# Patient Record
Sex: Female | Born: 1935 | Race: White | Hispanic: No | Smoking: Never smoker
Health system: Southern US, Community
[De-identification: ages and names within clinical notes are randomized; demographics above are authoritative.]

## PROBLEM LIST (undated history)

## (undated) DIAGNOSIS — K219 Gastro-esophageal reflux disease without esophagitis: Secondary | ICD-10-CM

## (undated) DIAGNOSIS — E785 Hyperlipidemia, unspecified: Secondary | ICD-10-CM

## (undated) DIAGNOSIS — F411 Generalized anxiety disorder: Secondary | ICD-10-CM

## (undated) DIAGNOSIS — M1990 Unspecified osteoarthritis, unspecified site: Secondary | ICD-10-CM

## (undated) DIAGNOSIS — I10 Essential (primary) hypertension: Secondary | ICD-10-CM

## (undated) DIAGNOSIS — E039 Hypothyroidism, unspecified: Secondary | ICD-10-CM

## (undated) DIAGNOSIS — I359 Nonrheumatic aortic valve disorder, unspecified: Secondary | ICD-10-CM

## (undated) DIAGNOSIS — B029 Zoster without complications: Secondary | ICD-10-CM

## (undated) DIAGNOSIS — L309 Dermatitis, unspecified: Secondary | ICD-10-CM

## (undated) DIAGNOSIS — Z95810 Presence of automatic (implantable) cardiac defibrillator: Secondary | ICD-10-CM

## (undated) DIAGNOSIS — J309 Allergic rhinitis, unspecified: Secondary | ICD-10-CM

## (undated) DIAGNOSIS — I872 Venous insufficiency (chronic) (peripheral): Secondary | ICD-10-CM

## (undated) HISTORY — DX: Generalized anxiety disorder: F41.1

## (undated) HISTORY — DX: Allergic rhinitis, unspecified: J30.9

## (undated) HISTORY — DX: Hyperlipidemia, unspecified: E78.5

## (undated) HISTORY — PX: VESICOVAGINAL FISTULA CLOSURE W/ TAH: SUR271

## (undated) HISTORY — DX: Nonrheumatic aortic valve disorder, unspecified: I35.9

## (undated) HISTORY — DX: Venous insufficiency (chronic) (peripheral): I87.2

## (undated) HISTORY — DX: Unspecified osteoarthritis, unspecified site: M19.90

## (undated) HISTORY — DX: Dermatitis, unspecified: L30.9

## (undated) HISTORY — DX: Hypothyroidism, unspecified: E03.9

## (undated) HISTORY — DX: Essential (primary) hypertension: I10

## (undated) HISTORY — DX: Gastro-esophageal reflux disease without esophagitis: K21.9

## (undated) HISTORY — DX: Zoster without complications: B02.9

---

## 1992-12-12 LAB — HM COLONOSCOPY

## 1999-12-03 ENCOUNTER — Other Ambulatory Visit: Admission: RE | Admit: 1999-12-03 | Discharge: 1999-12-03 | Payer: Self-pay | Admitting: Obstetrics and Gynecology

## 2002-04-24 ENCOUNTER — Other Ambulatory Visit: Admission: RE | Admit: 2002-04-24 | Discharge: 2002-04-24 | Payer: Self-pay | Admitting: Obstetrics and Gynecology

## 2002-05-02 ENCOUNTER — Encounter: Payer: Self-pay | Admitting: Emergency Medicine

## 2002-05-02 ENCOUNTER — Emergency Department (HOSPITAL_COMMUNITY): Admission: EM | Admit: 2002-05-02 | Discharge: 2002-05-02 | Payer: Self-pay | Admitting: Emergency Medicine

## 2002-05-03 ENCOUNTER — Encounter: Admission: RE | Admit: 2002-05-03 | Discharge: 2002-05-03 | Payer: Self-pay | Admitting: General Surgery

## 2002-05-03 ENCOUNTER — Encounter: Payer: Self-pay | Admitting: General Surgery

## 2002-05-04 ENCOUNTER — Encounter: Admission: RE | Admit: 2002-05-04 | Discharge: 2002-05-04 | Payer: Self-pay | Admitting: General Surgery

## 2002-05-04 ENCOUNTER — Encounter: Payer: Self-pay | Admitting: General Surgery

## 2002-05-31 ENCOUNTER — Encounter: Payer: Self-pay | Admitting: General Surgery

## 2002-05-31 ENCOUNTER — Encounter: Admission: RE | Admit: 2002-05-31 | Discharge: 2002-05-31 | Payer: Self-pay | Admitting: General Surgery

## 2002-06-21 ENCOUNTER — Ambulatory Visit (HOSPITAL_COMMUNITY): Admission: RE | Admit: 2002-06-21 | Discharge: 2002-06-21 | Payer: Self-pay | Admitting: General Surgery

## 2004-04-30 ENCOUNTER — Ambulatory Visit: Payer: Self-pay | Admitting: Pulmonary Disease

## 2005-01-05 ENCOUNTER — Ambulatory Visit: Payer: Self-pay | Admitting: Pulmonary Disease

## 2005-01-11 ENCOUNTER — Ambulatory Visit: Payer: Self-pay | Admitting: Pulmonary Disease

## 2005-03-04 ENCOUNTER — Ambulatory Visit: Payer: Self-pay | Admitting: Pulmonary Disease

## 2005-03-22 ENCOUNTER — Ambulatory Visit: Payer: Self-pay | Admitting: Pulmonary Disease

## 2005-04-12 ENCOUNTER — Ambulatory Visit: Payer: Self-pay | Admitting: Pulmonary Disease

## 2005-06-21 ENCOUNTER — Ambulatory Visit: Payer: Self-pay | Admitting: Pulmonary Disease

## 2005-10-12 ENCOUNTER — Ambulatory Visit: Payer: Self-pay | Admitting: Pulmonary Disease

## 2005-11-22 ENCOUNTER — Ambulatory Visit: Payer: Self-pay | Admitting: Pulmonary Disease

## 2005-12-09 ENCOUNTER — Encounter: Payer: Self-pay | Admitting: Cardiology

## 2005-12-09 ENCOUNTER — Ambulatory Visit: Payer: Self-pay

## 2005-12-20 ENCOUNTER — Ambulatory Visit: Payer: Self-pay | Admitting: Pulmonary Disease

## 2006-03-11 ENCOUNTER — Ambulatory Visit: Payer: Self-pay | Admitting: Pulmonary Disease

## 2006-03-28 ENCOUNTER — Ambulatory Visit: Payer: Self-pay | Admitting: Pulmonary Disease

## 2006-05-23 ENCOUNTER — Ambulatory Visit: Payer: Self-pay | Admitting: Pulmonary Disease

## 2006-07-04 ENCOUNTER — Ambulatory Visit: Payer: Self-pay | Admitting: Pulmonary Disease

## 2006-07-04 LAB — CONVERTED CEMR LAB: Hgb A1c MFr Bld: 5.4 % (ref 4.6–6.0)

## 2006-07-21 ENCOUNTER — Ambulatory Visit: Payer: Self-pay | Admitting: Pulmonary Disease

## 2006-07-21 LAB — CONVERTED CEMR LAB
ALT: 18 units/L (ref 0–40)
AST: 25 units/L (ref 0–37)
Albumin: 3.5 g/dL (ref 3.5–5.2)
Alkaline Phosphatase: 54 units/L (ref 39–117)
BUN: 15 mg/dL (ref 6–23)
Basophils Absolute: 0.1 10*3/uL (ref 0.0–0.1)
Basophils Relative: 1.4 % — ABNORMAL HIGH (ref 0.0–1.0)
Bilirubin, Direct: 0.1 mg/dL (ref 0.0–0.3)
CO2: 27 meq/L (ref 19–32)
Calcium: 8.9 mg/dL (ref 8.4–10.5)
Chloride: 108 meq/L (ref 96–112)
Cholesterol: 155 mg/dL (ref 0–200)
Creatinine, Ser: 1 mg/dL (ref 0.4–1.2)
Direct LDL: 99.7 mg/dL
Eosinophils Absolute: 0.2 10*3/uL (ref 0.0–0.6)
Eosinophils Relative: 3.5 % (ref 0.0–5.0)
GFR calc Af Amer: 69 mL/min
GFR calc non Af Amer: 57 mL/min
Glucose, Bld: 104 mg/dL — ABNORMAL HIGH (ref 70–99)
HCT: 37.9 % (ref 36.0–46.0)
HDL: 34.8 mg/dL — ABNORMAL LOW (ref >39.0)
Hemoglobin: 13.1 g/dL (ref 12.0–15.0)
Lymphocytes Relative: 44.4 % (ref 12.0–46.0)
MCHC: 34.5 g/dL (ref 30.0–36.0)
MCV: 93.1 fL (ref 78.0–100.0)
Monocytes Absolute: 0.5 10*3/uL (ref 0.2–0.7)
Monocytes Relative: 9.6 % (ref 3.0–11.0)
Neutro Abs: 2 10*3/uL (ref 1.4–7.7)
Neutrophils Relative %: 41.1 % — ABNORMAL LOW (ref 43.0–77.0)
Platelets: 211 10*3/uL (ref 150–400)
Potassium: 4.2 meq/L (ref 3.5–5.1)
RBC: 4.08 M/uL (ref 3.87–5.11)
RDW: 13.4 % (ref 11.5–14.6)
Sodium: 141 meq/L (ref 135–145)
TSH: 3.87 microintl units/mL (ref 0.35–5.50)
Total Bilirubin: 0.3 mg/dL (ref 0.3–1.2)
Total CHOL/HDL Ratio: 4.5
Total Protein: 6.9 g/dL (ref 6.0–8.3)
Triglycerides: 227 mg/dL (ref 0–149)
VLDL: 45 mg/dL — ABNORMAL HIGH (ref 0–40)
WBC: 4.9 10*3/uL (ref 4.5–10.5)

## 2006-09-14 ENCOUNTER — Ambulatory Visit: Payer: Self-pay | Admitting: Pulmonary Disease

## 2006-09-14 LAB — CONVERTED CEMR LAB
CK-MB: 5 ng/mL (ref 0.3–4.0)
Pro B Natriuretic peptide (BNP): 24 pg/mL (ref 0.0–100.0)
Sed Rate: 44 mm/hr — ABNORMAL HIGH (ref 0–25)

## 2006-10-06 ENCOUNTER — Ambulatory Visit: Payer: Self-pay | Admitting: Cardiology

## 2006-10-13 ENCOUNTER — Ambulatory Visit: Payer: Self-pay

## 2006-10-26 ENCOUNTER — Ambulatory Visit: Payer: Self-pay | Admitting: Pulmonary Disease

## 2007-01-13 ENCOUNTER — Ambulatory Visit: Payer: Self-pay | Admitting: Pulmonary Disease

## 2007-04-10 ENCOUNTER — Ambulatory Visit: Payer: Self-pay | Admitting: Pulmonary Disease

## 2007-04-12 DIAGNOSIS — K219 Gastro-esophageal reflux disease without esophagitis: Secondary | ICD-10-CM | POA: Insufficient documentation

## 2007-04-12 DIAGNOSIS — F411 Generalized anxiety disorder: Secondary | ICD-10-CM | POA: Insufficient documentation

## 2007-04-12 DIAGNOSIS — E782 Mixed hyperlipidemia: Secondary | ICD-10-CM | POA: Insufficient documentation

## 2007-04-12 DIAGNOSIS — B029 Zoster without complications: Secondary | ICD-10-CM | POA: Insufficient documentation

## 2007-04-12 DIAGNOSIS — E785 Hyperlipidemia, unspecified: Secondary | ICD-10-CM

## 2007-04-12 DIAGNOSIS — I872 Venous insufficiency (chronic) (peripheral): Secondary | ICD-10-CM | POA: Insufficient documentation

## 2007-04-12 DIAGNOSIS — J209 Acute bronchitis, unspecified: Secondary | ICD-10-CM | POA: Insufficient documentation

## 2007-04-18 ENCOUNTER — Ambulatory Visit: Payer: Self-pay | Admitting: Pulmonary Disease

## 2007-08-07 ENCOUNTER — Ambulatory Visit: Payer: Self-pay | Admitting: Pulmonary Disease

## 2007-08-16 ENCOUNTER — Ambulatory Visit: Payer: Self-pay | Admitting: Pulmonary Disease

## 2007-08-16 ENCOUNTER — Telehealth (INDEPENDENT_AMBULATORY_CARE_PROVIDER_SITE_OTHER): Payer: Self-pay | Admitting: *Deleted

## 2007-10-31 ENCOUNTER — Ambulatory Visit: Payer: Self-pay | Admitting: Pulmonary Disease

## 2007-10-31 DIAGNOSIS — J309 Allergic rhinitis, unspecified: Secondary | ICD-10-CM

## 2007-10-31 DIAGNOSIS — I10 Essential (primary) hypertension: Secondary | ICD-10-CM | POA: Insufficient documentation

## 2007-10-31 DIAGNOSIS — E039 Hypothyroidism, unspecified: Secondary | ICD-10-CM | POA: Insufficient documentation

## 2007-11-05 DIAGNOSIS — M1990 Unspecified osteoarthritis, unspecified site: Secondary | ICD-10-CM | POA: Insufficient documentation

## 2007-11-05 DIAGNOSIS — D126 Benign neoplasm of colon, unspecified: Secondary | ICD-10-CM | POA: Insufficient documentation

## 2007-11-08 ENCOUNTER — Ambulatory Visit: Payer: Self-pay | Admitting: Pulmonary Disease

## 2007-11-27 ENCOUNTER — Telehealth (INDEPENDENT_AMBULATORY_CARE_PROVIDER_SITE_OTHER): Payer: Self-pay | Admitting: *Deleted

## 2007-12-01 ENCOUNTER — Ambulatory Visit: Payer: Self-pay | Admitting: Pulmonary Disease

## 2007-12-14 LAB — CONVERTED CEMR LAB
Albumin: 3.6 g/dL (ref 3.5–5.2)
Alkaline Phosphatase: 47 units/L (ref 39–117)
BUN: 18 mg/dL (ref 6–23)
Calcium: 9.3 mg/dL (ref 8.4–10.5)
Eosinophils Absolute: 0.3 10*3/uL (ref 0.0–0.7)
Eosinophils Relative: 4.2 % (ref 0.0–5.0)
GFR calc Af Amer: 78 mL/min
GFR calc non Af Amer: 64 mL/min
HCT: 38.1 % (ref 36.0–46.0)
HDL: 30.4 mg/dL — ABNORMAL LOW (ref >39.0)
Hemoglobin: 12.5 g/dL (ref 12.0–15.0)
MCV: 94.5 fL (ref 78.0–100.0)
Monocytes Absolute: 0.5 10*3/uL (ref 0.1–1.0)
Neutro Abs: 2.5 10*3/uL (ref 1.4–7.7)
Platelets: 193 10*3/uL (ref 150–400)
Potassium: 4.1 meq/L (ref 3.5–5.1)
RDW: 13.6 % (ref 11.5–14.6)
TSH: 3.55 microintl units/mL (ref 0.35–5.50)
Total Protein: 6.8 g/dL (ref 6.0–8.3)
Triglycerides: 170 mg/dL — ABNORMAL HIGH (ref 0–149)

## 2008-01-24 ENCOUNTER — Ambulatory Visit: Payer: Self-pay | Admitting: Pulmonary Disease

## 2008-01-24 DIAGNOSIS — I359 Nonrheumatic aortic valve disorder, unspecified: Secondary | ICD-10-CM | POA: Insufficient documentation

## 2008-01-24 DIAGNOSIS — I350 Nonrheumatic aortic (valve) stenosis: Secondary | ICD-10-CM | POA: Insufficient documentation

## 2008-03-21 ENCOUNTER — Encounter: Payer: Self-pay | Admitting: Pulmonary Disease

## 2008-03-21 ENCOUNTER — Ambulatory Visit: Payer: Self-pay

## 2008-03-27 ENCOUNTER — Ambulatory Visit: Payer: Self-pay | Admitting: Pulmonary Disease

## 2008-04-26 ENCOUNTER — Telehealth (INDEPENDENT_AMBULATORY_CARE_PROVIDER_SITE_OTHER): Payer: Self-pay | Admitting: *Deleted

## 2008-06-05 ENCOUNTER — Telehealth (INDEPENDENT_AMBULATORY_CARE_PROVIDER_SITE_OTHER): Payer: Self-pay | Admitting: *Deleted

## 2008-06-06 ENCOUNTER — Ambulatory Visit: Payer: Self-pay | Admitting: Pulmonary Disease

## 2008-06-06 DIAGNOSIS — J069 Acute upper respiratory infection, unspecified: Secondary | ICD-10-CM | POA: Insufficient documentation

## 2008-07-25 ENCOUNTER — Ambulatory Visit: Payer: Self-pay | Admitting: Pulmonary Disease

## 2008-07-29 ENCOUNTER — Ambulatory Visit: Payer: Self-pay | Admitting: Pulmonary Disease

## 2008-08-16 LAB — CONVERTED CEMR LAB
Alkaline Phosphatase: 43 units/L (ref 39–117)
Basophils Absolute: 0.1 10*3/uL (ref 0.0–0.1)
Bilirubin, Direct: 0.1 mg/dL (ref 0.0–0.3)
Calcium: 9.8 mg/dL (ref 8.4–10.5)
Cholesterol: 181 mg/dL (ref 0–200)
Eosinophils Absolute: 0.2 10*3/uL (ref 0.0–0.7)
GFR calc Af Amer: 69 mL/min
GFR calc non Af Amer: 57 mL/min
Glucose, Bld: 101 mg/dL — ABNORMAL HIGH (ref 70–99)
HCT: 38.4 % (ref 36.0–46.0)
HDL: 35.3 mg/dL — ABNORMAL LOW (ref >39.0)
Hemoglobin: 13.2 g/dL (ref 12.0–15.0)
MCHC: 34.3 g/dL (ref 30.0–36.0)
MCV: 93.5 fL (ref 78.0–100.0)
Monocytes Absolute: 0.5 10*3/uL (ref 0.1–1.0)
Neutro Abs: 2.3 10*3/uL (ref 1.4–7.7)
Platelets: 192 10*3/uL (ref 150–400)
Potassium: 4.2 meq/L (ref 3.5–5.1)
RDW: 13.6 % (ref 11.5–14.6)
Sodium: 143 meq/L (ref 135–145)
Total CHOL/HDL Ratio: 5.1
Total Protein: 7.2 g/dL (ref 6.0–8.3)
Triglycerides: 198 mg/dL — ABNORMAL HIGH (ref 0–149)

## 2008-11-13 ENCOUNTER — Telehealth (INDEPENDENT_AMBULATORY_CARE_PROVIDER_SITE_OTHER): Payer: Self-pay | Admitting: *Deleted

## 2008-11-21 ENCOUNTER — Ambulatory Visit: Payer: Self-pay | Admitting: Pulmonary Disease

## 2009-03-26 ENCOUNTER — Ambulatory Visit: Payer: Self-pay | Admitting: Pulmonary Disease

## 2009-03-26 DIAGNOSIS — L259 Unspecified contact dermatitis, unspecified cause: Secondary | ICD-10-CM

## 2009-04-02 ENCOUNTER — Ambulatory Visit: Payer: Self-pay | Admitting: Pulmonary Disease

## 2009-04-14 LAB — CONVERTED CEMR LAB
ALT: 25 units/L (ref 0–35)
AST: 30 units/L (ref 0–37)
Albumin: 4.1 g/dL (ref 3.5–5.2)
Alkaline Phosphatase: 33 units/L — ABNORMAL LOW (ref 39–117)
BUN: 25 mg/dL — ABNORMAL HIGH (ref 6–23)
Basophils Absolute: 0 10*3/uL (ref 0.0–0.1)
Basophils Relative: 0.4 % (ref 0.0–3.0)
Bilirubin, Direct: 0.1 mg/dL (ref 0.0–0.3)
CO2: 29 meq/L (ref 19–32)
Calcium: 9.5 mg/dL (ref 8.4–10.5)
Chloride: 106 meq/L (ref 96–112)
Cholesterol: 183 mg/dL (ref 0–200)
Creatinine, Ser: 1.4 mg/dL — ABNORMAL HIGH (ref 0.4–1.2)
Eosinophils Absolute: 0.2 10*3/uL (ref 0.0–0.7)
Eosinophils Relative: 3.2 % (ref 0.0–5.0)
GFR calc non Af Amer: 38.34 mL/min (ref >60)
Glucose, Bld: 81 mg/dL (ref 70–99)
HCT: 37.7 % (ref 36.0–46.0)
HDL: 40.3 mg/dL (ref >39.00)
Hemoglobin: 13 g/dL (ref 12.0–15.0)
LDL Cholesterol: 121 mg/dL — ABNORMAL HIGH (ref 0–99)
Lymphocytes Relative: 43.2 % (ref 12.0–46.0)
Lymphs Abs: 2.7 10*3/uL (ref 0.7–4.0)
MCHC: 34.5 g/dL (ref 30.0–36.0)
MCV: 95.9 fL (ref 78.0–100.0)
Monocytes Absolute: 0.5 10*3/uL (ref 0.1–1.0)
Monocytes Relative: 8.8 % (ref 3.0–12.0)
Neutro Abs: 2.8 10*3/uL (ref 1.4–7.7)
Neutrophils Relative %: 44.4 % (ref 43.0–77.0)
Platelets: 194 10*3/uL (ref 150.0–400.0)
Potassium: 4.2 meq/L (ref 3.5–5.1)
RBC: 3.93 M/uL (ref 3.87–5.11)
RDW: 13.5 % (ref 11.5–14.6)
Sodium: 146 meq/L — ABNORMAL HIGH (ref 135–145)
TSH: 4.42 microintl units/mL (ref 0.35–5.50)
Total Bilirubin: 0.7 mg/dL (ref 0.3–1.2)
Total CHOL/HDL Ratio: 5
Total Protein: 7.3 g/dL (ref 6.0–8.3)
Triglycerides: 109 mg/dL (ref 0.0–149.0)
VLDL: 21.8 mg/dL (ref 0.0–40.0)
WBC: 6.2 10*3/uL (ref 4.5–10.5)

## 2009-04-16 LAB — CONVERTED CEMR LAB: Vit D, 25-Hydroxy: 15 ng/mL — ABNORMAL LOW (ref 30–89)

## 2009-07-09 ENCOUNTER — Telehealth (INDEPENDENT_AMBULATORY_CARE_PROVIDER_SITE_OTHER): Payer: Self-pay | Admitting: *Deleted

## 2009-12-02 ENCOUNTER — Ambulatory Visit: Payer: Self-pay | Admitting: Pulmonary Disease

## 2009-12-02 ENCOUNTER — Encounter: Payer: Self-pay | Admitting: Adult Health

## 2009-12-02 DIAGNOSIS — E559 Vitamin D deficiency, unspecified: Secondary | ICD-10-CM

## 2009-12-10 LAB — CONVERTED CEMR LAB: Vit D, 25-Hydroxy: 26 ng/mL — ABNORMAL LOW (ref 30–89)

## 2010-02-13 ENCOUNTER — Telehealth: Payer: Self-pay | Admitting: Pulmonary Disease

## 2010-04-01 ENCOUNTER — Ambulatory Visit: Payer: Self-pay | Admitting: Pulmonary Disease

## 2010-04-03 ENCOUNTER — Ambulatory Visit: Payer: Self-pay | Admitting: Pulmonary Disease

## 2010-04-05 LAB — CONVERTED CEMR LAB
ALT: 31 units/L (ref 0–35)
AST: 42 units/L — ABNORMAL HIGH (ref 0–37)
Albumin: 3.7 g/dL (ref 3.5–5.2)
Basophils Absolute: 0.1 10*3/uL (ref 0.0–0.1)
Calcium: 9.3 mg/dL (ref 8.4–10.5)
Cholesterol: 177 mg/dL (ref 0–200)
GFR calc non Af Amer: 45.69 mL/min (ref >60)
Glucose, Bld: 99 mg/dL (ref 70–99)
HDL: 33.6 mg/dL — ABNORMAL LOW (ref >39.00)
Hemoglobin: 13.1 g/dL (ref 12.0–15.0)
Lymphocytes Relative: 45.3 % (ref 12.0–46.0)
Monocytes Relative: 9.4 % (ref 3.0–12.0)
Neutro Abs: 2 10*3/uL (ref 1.4–7.7)
RDW: 14.8 % — ABNORMAL HIGH (ref 11.5–14.6)
Sodium: 142 meq/L (ref 135–145)
TSH: 5 microintl units/mL (ref 0.35–5.50)
Total Bilirubin: 0.6 mg/dL (ref 0.3–1.2)
Total Protein: 6.6 g/dL (ref 6.0–8.3)
Triglycerides: 135 mg/dL (ref 0.0–149.0)
WBC: 5.3 10*3/uL (ref 4.5–10.5)

## 2010-04-13 ENCOUNTER — Telehealth (INDEPENDENT_AMBULATORY_CARE_PROVIDER_SITE_OTHER): Payer: Self-pay | Admitting: *Deleted

## 2010-04-24 ENCOUNTER — Telehealth: Payer: Self-pay | Admitting: Pulmonary Disease

## 2010-07-16 NOTE — Assessment & Plan Note (Signed)
Summary: NP follow up - med refills   Primary Provider/Referring Provider:  Dr. Kriste Basque  CC:  follow up to have xanax refilled - last OV w/ SN 03/2009.Marland Kitchen  History of Present Illness: 77 year old female patient of Dr. Kriste Basque with known history of HTN, FM, and hyperlipidemia    8/09-- follow up visit... she has mult med problems as noted below... she was seen 6/09 by TParrett,NP for another URI that required Augmentin, Pred,  Mucinex to finally resolve...  June 06, 2008 --Complains of both ears stopped up, hoarseness, some sinus pressure x4 days    ~  July 25, 2008:  she has had quiet a bit of trouble w/ her ears- 3 visits to [facility], ENT & finally improved- she reports Rx w/ ear drops and antibiotics... also saw DrGruber for Derm- Rx w/ cream for ears + shampoo... here for f/u AB, HBP, Valvular Hrt Dis, Chol, Thyroid, etc... doing well overall, no new complaints or concerns... she is not fasting today & will ret next week for FLabs...  November 21, 2008--Presents for an acute office visit.  URI signs   ~  March 26, 2009:  80mo follow up- doing reasonably well, no new complaints or concerns, needs a few refills and Flu shot today... she's under considerable stress w/ husb's illness & need to go weekly to wound clinic (she has Alprazolam for Prn use)...  December 02, 2009--Presents for follow up and med refills. Last visit labs showed low vit D started on 50k weekly, needs lab today,. She is staying busy, husband goes to wound center weekly, she is a Chiropractor", xanax helps her to calm down some, uses intermittently but not on reg. basis. She feels good except joint aches occasionally. Denies chest pain, dyspnea, orthopnea, hemoptysis, fever, n/v/d, edema, headache,.      Medications Prior to Update: 1)  Lisinopril 20 Mg  Tabs (Lisinopril) .... Take 1 Tablet By Mouth Once A Day 2)  Furosemide 40 Mg  Tabs (Furosemide) .... Take One Half of A Tablet By Mouth Once Daily 3)  Simvastatin 40 Mg   Tabs (Simvastatin) .... Take 1 Tablet By Mouth At Bedtime 4)  Fenofibrate 160 Mg Tabs (Fenofibrate) .... Take One Tablet By Mouth Once Daily 5)  Levothroid 50 Mcg  Tabs (Levothyroxine Sodium) .... Take 1 Tablet By Mouth Once A Day 6)  Omeprazole 20 Mg  Tbec (Omeprazole) .... Take 1 Tablet By Mouth Every Morning 7)  Darvocet-N 100 100-650 Mg  Tabs (Propoxyphene N-Apap) .... Take 1 Tab By Mouth Q 6-8 H As Needed For Pain... 8)  Alprazolam 0.25 Mg  Tabs (Alprazolam) .... Take 1 Tab By Mouth Three Times A Day As Needed For Nerves.Marland KitchenMarland Kitchen 9)  Mometasone Furoate 0.1 %  Crea (Mometasone Furoate) .... Apply To Rash As Needed... 10)  Clotrimazole-Betamethasone 1-0.05 % Crea (Clotrimazole-Betamethasone) .... Apply To Rash Two Times A Day As Directed... 11)  Vitamin D (Ergocalciferol) 50000 Unit Caps (Ergocalciferol) .... Take One Capsule By Mouth Once Weekly 12)  Zithromax Z-Pak 250 Mg Tabs (Azithromycin) .... Take As Directed  Allergies (verified): 1)  Sulfa  Past History:  Past Surgical History: Last updated: 03/26/2009 S/P hysterectomy  Family History: Last updated: 01-29-2008 father died at age 39 in an accident mother died at age 32 with a stroke and diabetes 1 sibling--1 sister alive and well at age 35 alll other 6 siblings are deceased  Social History: Last updated: Jan 29, 2008 pt is married and is retired has 3 children does not  smoke or drink  Risk Factors: Smoking Status: never (07/25/2008)  Past Medical History: ALLERGIC RHINITIS (ICD-477.9) - she uses OTC Claritin as needed.  Hx of ASTHMATIC BRONCHITIS, ACUTE (ICD-466.0) - on ADVAIR 100- using Prn & doing well without AB exas.  HYPERTENSION (ICD-401.9) - on LISINOPRIL 20mg /d and LASIX 40mg - taking 1/2 tab daily...   AORTIC VALVE DISEASE (ICD-424.1) - Cardiology eval 2008 by DrHochrein... known mild Ao Stenosis w/ 2DEcho 6/07 showing mild incr AoV thickness, mild reduced AoV leaflet excursions, AoV sclerosis w/o stenosis, norm  LVF w/ EF= 65%.  ~  NuclearStressTest 5/08 was neg- no ischemia, no infarct, EF= 80%...  ~  2DEcho 10/09 showed norm LV sys function w/ EF= 55-60%, no regional wall motion abn, mild incr LVwall thickness, mild DD, mild calcif AoV, low-nl leaflet excursion- c/w mild AS.  ~  ** note- CDopplers 10/09 were OK- mild plaque on right, 0-39% bilat...  VENOUS INSUFFICIENCY (ICD-459.81) - she follows a low sodium diet, elevates legs and uses support hose as needed.     Review of Systems      See HPI  Vital Signs:  Patient profile:   77 year old female Height:      65 inches Weight:      192 pounds BMI:     32.07 O2 Sat:      98 % on Room air Temp:     97.3 degrees F oral Pulse rate:   60 / minute BP sitting:   142 / 90  (left arm) Cuff size:   regular  Vitals Entered By: Boone Master CNA/MA (December 02, 2009 11:59 AM)  O2 Flow:  Room air CC: follow up to have xanax refilled - last OV w/ SN 03/2009. Is Patient Diabetic? No Comments Medications reviewed with patient Daytime contact number verified with patient. Boone Master CNA/MA  December 02, 2009 11:59 AM    Physical Exam  Additional Exam:  WD, Mickel Duhamel y/o WF in NAD...  GENERAL:  Alert & oriented; pleasant & cooperative... HEENT:  [place]/AT, EOM-wnl, PERRLA, EACs-clear, TMs-wnl, NOSE-clear, THROAT-clear & wnl. NECK:  Supple w/ fairROM; no JVD; no thyromegaly or nodules palpated; no lymphadenopathy;  transmit murmur in base of neck to carotids... CHEST:  Clear to P & A; without wheezes/ rales/ or rhonchi. HEART:  Regular Rhythm;  gr1-2/6 SEM at base w/ rad to neck; without rubs or gallops heard... ABDOMEN:  Soft & nontender; + panniculus; normal bowel sounds; no organomegaly or masses detected. EXT: without deformities, mild arthritic changes; no varicose veins/ +venous insuffic/ tr. -1+ edema. NEURO:  CN's intact;  no focal neuro deficits... DERM:  No lesions noted; no rash etc...    Impression & Recommendations:  Problem # 1:   HYPERTENSION (ICD-401.9)  Controlled on rx  no changes diet and exercise discucsed.  Her updated medication list for this problem includes:    Lisinopril 20 Mg Tabs (Lisinopril) .Marland Kitchen... Take 1 tablet by mouth once a day    Furosemide 40 Mg Tabs (Furosemide) .Marland Kitchen... Take one half of a tablet by mouth once daily  Orders: Est. Patient Level III ([address])  Problem # 2:  GERD (ICD-530.81) controlled  Her updated medication list for this problem includes:    Omeprazole 20 Mg Tbec (Omeprazole) .Marland Kitchen... Take 1 tablet by mouth every morning  Problem # 3:  ANXIETY (ICD-300.00)  refill of xanax stress reducers  Her updated medication list for this problem includes:    Alprazolam 0.25 Mg Tabs (Alprazolam) .Marland Kitchen... Take 1 tab  by mouth three times a day as needed for nerves...  Orders: Est. Patient Level III ([address])  Problem # 4:  VITAMIN D DEFICIENCY (ICD-268.9)  labs today  cont on current regimen  Orders: Est. Patient Level III ([address])  Complete Medication List: 1)  Lisinopril 20 Mg Tabs (Lisinopril) .... Take 1 tablet by mouth once a day 2)  Furosemide 40 Mg Tabs (Furosemide) .... Take one half of a tablet by mouth once daily 3)  Simvastatin 40 Mg Tabs (Simvastatin) .... Take 1 tablet by mouth at bedtime 4)  Fenofibrate 160 Mg Tabs (Fenofibrate) .... Take one tablet by mouth once daily 5)  Levothroid 50 Mcg Tabs (Levothyroxine sodium) .... Take 1 tablet by mouth once a day 6)  Omeprazole 20 Mg Tbec (Omeprazole) .... Take 1 tablet by mouth every morning 7)  Darvocet-n 100 100-650 Mg Tabs (Propoxyphene n-apap) .... Take 1 tab by mouth q 6-8 h as needed for pain.Marland KitchenMarland Kitchen 8)  Alprazolam 0.25 Mg Tabs (Alprazolam) .... Take 1 tab by mouth three times a day as needed for nerves.Marland KitchenMarland Kitchen 9)  Mometasone Furoate 0.1 % Crea (Mometasone furoate) .... Apply to rash as needed... 10)  Clotrimazole-betamethasone 1-0.05 % Crea (Clotrimazole-betamethasone) .... Apply to rash two times a day as directed... 11)  Vitamin D  (ergocalciferol) 50000 Unit Caps (Ergocalciferol) .... Take one capsule by mouth once weekly  Other Orders: T-Vitamin D (25-Hydroxy) ([address])  Patient Instructions: 1)  I will call with vitamin D level  2)  Continue on current regimen  3)  stress reducers, exercise as tolerated.  4)  follow up Dr. Kriste Basque 4 months and as needed

## 2010-07-16 NOTE — Progress Notes (Signed)
Summary: prod cough  Phone Note Call from Patient Call back at Home Phone [phone]   Caller: Patient Call For: Dr. Kriste Basque Summary of Call: pt c/o drippy nose, prod cough with light yellow mucus, chills and fatigue x1day.  pt denies fever, wheezing, SOB.  would like zpak. [facility] ALLERGIES: sulfa Initial call taken by: Boone Master CNA,  July 09, 2009 2:41 PM  Follow-up for Phone Call        pt has a little wheezing today, feels she needs a zpak.  Has been waiting to hear from someone since 07/09/2009.  Extreme fatigue.  Just wants to lay down. Please respond Follow-up by: Eugene Gavia,  July 11, 2009 8:14 AM  Additional Follow-up for Phone Call Additional follow up Details #1::        per SN---ok for pt to have zpak    this has been sent to pts pharmacy and she is aware. Randell Loop CMA  July 11, 2009 11:24 AM     New/Updated Medications: ZITHROMAX Z-PAK 250 MG TABS (AZITHROMYCIN) take as directed Prescriptions: ZITHROMAX Z-PAK 250 MG TABS (AZITHROMYCIN) take as directed  #1 pak x 0   Entered by:   Randell Loop CMA   Authorized by:   Michele Mcalpine MD   Signed by:   Randell Loop CMA on 07/11/2009   Method used:   Electronically to        [id number] (retail)       [address]       [place], [place]  [address]       Ph: [phone] or [phone]       Fax: [phone]   RxID:   [id number]

## 2010-07-16 NOTE — Assessment & Plan Note (Signed)
Summary: CPX/SKB   Primary Care Provider:  Dr. Kriste Basque  CC:  Yearly ROV & review of mult medical problems....  History of Present Illness: 77 y/o WF here for a follow up visit... Deanna Olson has mult med problems as noted below...   ~  July 25, 2008:  Deanna Olson has had quiet a bit of trouble w/ her ears- 3 visits to [facility], ENT & finally improved- Deanna Olson reports Rx w/ ear drops and antibiotics... also saw DrGruber for Derm- Rx w/ cream for ears + shampoo... here for f/u AB, HBP, Valvular Hrt Dis, Chol, Thyroid, etc... doing well overall, no new complaints or concerns... Deanna Olson is not fasting today & will ret next week for FLabs...   ~  March 26, 2009:  40mo follow up- doing reasonably well, no new complaints or concerns, needs a few refills and Flu shot today... Deanna Olson's under considerable stress w/ husb's illness & need to go weekly to wound clinic (Deanna Olson has Alprazolam for Prn use)...   ~  April 01, 2010:  Yearly ROV- still under stress caring for husb, but Deanna Olson has been doing well w/o new complaints or concerns... notes occas HA's c/w muscle contraction & refief by ASA & Alpraz...  BP controlled on meds;  hx mild AS w/o dizzy, syncope, CP, palpit, ch in dyspnea;  Lipids improved w/ diet +meds;  Thyroid stable;  others OK as well- Deanna Olson wants Flu shot today & will ret for fasting blood work.    Current Problem List:  ALLERGIC RHINITIS (ICD-477.9) - Deanna Olson uses OTC Claritin as needed.  Hx of ASTHMATIC BRONCHITIS, ACUTE (ICD-466.0) - prev on Advair- using Prn & doing well without AB exac.  HYPERTENSION (ICD-401.9) - on LISINOPRIL 20mg /d and LASIX 40mg - taking 1/2 tab daily... BP= 120/60 and doing well... denies HA, fatigue, visual changes, CP, palipit, dizziness, syncope, dyspnea, edema, etc...  AORTIC VALVE DISEASE (ICD-424.1) - Cardiology eval 2008 by DrHochrein... known mild Ao Stenosis w/ 2DEcho 6/07 showing mild incr AoV thickness, mild reduced AoV leaflet excursions, AoV sclerosis w/o stenosis, norm LVF w/  EF= 65%.  ~  NuclearStressTest 5/08 was neg- no ischemia, no infarct, EF= 80%...  ~  2DEcho 10/09 showed norm LV sys function w/ EF= 55-60%, no regional wall motion abn, mild incr LVwall thickness, mild DD, mild calcif AoV, low-nl leaflet excursion- c/w mild AS.  ~  note- CDopplers 10/09 were OK- mild plaque on right, 0-39% bilat...  VENOUS INSUFFICIENCY (ICD-459.81) - Deanna Olson follows a low sodium diet, elevates legs and uses support hose as needed.  HYPERLIPIDEMIA (ICD-272.4) - on SIMVASTATIN 40mg /d & FENOFIBRATE 160mg /d... + low fat diet... weight down 2# to 191#.  ~  FLP 2/08 showed TChol 155, TG 227, HDL 35, LDL 100  ~  FLP 5/09 showed TChol 175, TG 170, HDL 30, LDL 111  ~  FLP 2/10 showed TChol 181, TG 198, HDL 35, LDL 106... rec> add FENOFIBRATE 160mg /d.  ~  FLP 10/10 showed TChol 183, TG 109, HDL 40, LDL 121  ~  FLP 10/11 showed TChol 177, TG 135, HDL 34, LDL 116  HYPOTHYROIDISM (ICD-244.9) - on LEVOTHYROID 58mcg/d... and clinically euthyroid...  ~  last TSH 2/08 was 3.87  ~  labs 5/09 showed TSH= 3.55  ~  labs 10/10 showed TSH= 4.42  ~  labs 10/11 showed TSH= 5.00  GERD (ICD-530.81) - on OMEPRAZOLE 20mg /d... Deanna Olson is asymptomatic...  ~  EGD in 1989 by DrPatterson showed smHH, otherw neg... subseq 24H pH probe was normal...  COLONIC  POLYPS (ICD-211.3) - tiny hyperplastic polyp removed via FlexSig 1994... no f/u since then, but Deanna Olson denies abd discomfort, d/c/bl etc...  DEGENERATIVE JOINT DISEASE (ICD-715.90) - eval by DrOlin 2006 w/ bilat knee osteoarthritis... Deanna Olson uses OTC meds for pain...  FIBROMYALGIA (ICD-729.1) - not currently on meds...  ANXIETY (ICD-300.00) - uses ALPRAZOLAM 0.25mg  as needed... under stress w/ MrLinker going to the wound care clinic...  Hx of SHINGLES (ICD-053.9)  RASH - Deanna Olson notes intertriginous rash & we will attempt Rx w/ LOTRISONE Cream Bid & Deanna Olson uses DESONIDE 0.05% for ears...   Preventive Screening-Counseling & Management  Alcohol-Tobacco      Smoking Status: never  Allergies: 1)  Sulfa  Comments:  Nurse/Medical Assistant: The patient's medications and allergies were reviewed with the patient and were updated in the Medication and Allergy Lists.  Past History:  Past Medical History: ALLERGIC RHINITIS (ICD-477.9) Hx of ASTHMATIC BRONCHITIS, ACUTE (ICD-466.0) HYPERTENSION (ICD-401.9) AORTIC VALVE DISEASE (ICD-424.1) VENOUS INSUFFICIENCY (ICD-459.81) HYPERLIPIDEMIA (ICD-272.4) HYPOTHYROIDISM (ICD-244.9) GERD (ICD-530.81) COLONIC POLYPS (ICD-211.3) DEGENERATIVE JOINT DISEASE (ICD-715.90) FIBROMYALGIA (ICD-729.1) ANXIETY (ICD-300.00) SHINGLES (ICD-053.9) DERMATITIS (ICD-692.9) VITAMIN D DEFICIENCY (ICD-268.9)  Past Surgical History: S/P hysterectomy  Family History: Reviewed history from 01/24/2008 and no changes required. father died at age 30 in an accident mother died at age 45 with a stroke and diabetes 1 sibling--1 sister alive and well at age 31 alll other 6 siblings are deceased  Social History: Reviewed history from 01/24/2008 and no changes required. pt is married and is retired has 3 children does not smoke or drink  Review of Systems  The patient denies anorexia, fever, weight loss, weight gain, vision loss, decreased hearing, hoarseness, chest pain, syncope, dyspnea on exertion, peripheral edema, prolonged cough, headaches, hemoptysis, abdominal pain, melena, hematochezia, severe indigestion/heartburn, hematuria, incontinence, muscle weakness, suspicious skin lesions, transient blindness, difficulty walking, depression, unusual weight change, abnormal bleeding, enlarged lymph nodes, and angioedema.    Vital Signs:  Patient profile:   77 year old female Height:      65 inches Weight:      187.38 pounds BMI:     31.29 O2 Sat:      98 % on Room air Temp:     97.0 degrees F oral Pulse rate:   62 / minute BP sitting:   120 / 60  (left arm) Cuff size:   regular  Vitals Entered By: Randell Loop  CMA (April 01, 2010 11:01 AM)  O2 Sat at Rest %:  98 O2 Flow:  Room air CC: Yearly ROV & review of mult medical problems... Is Patient Diabetic? No Pain Assessment Patient in pain? no      Comments meds updated today with pt   Physical Exam  Additional Exam:  WD, Overweight, 77 y/o WF in NAD...  GENERAL:  Alert & oriented; pleasant & cooperative... HEENT:  /AT, EOM-wnl, PERRLA, EACs-clear, TMs-wnl, NOSE-clear, THROAT-clear & wnl. NECK:  Supple w/ fairROM; no JVD; no thyromegaly or nodules palpated; no lymphadenopathy;  transmit murmur in base of neck to carotids... CHEST:  Clear to P & A; without wheezes/ rales/ or rhonchi. HEART:  Regular Rhythm;  gr1-2/6 SEM at base w/ rad to neck; without rubs or gallops heard... ABDOMEN:  Soft & nontender; + panniculus; normal bowel sounds; no organomegaly or masses detected. EXT: without deformities, mild arthritic changes; no varicose veins/ +venous insuffic/ tr. -1+ edema. NEURO:  CN's intact;  no focal neuro deficits... DERM:  Intertrig rash on groin/ abd...    MISC. Report  Procedure  date:  04/03/2010  Findings:      Lipid Panel (LIPID)   Cholesterol               177 mg/dL                   1-610   Triglycerides             135.0 mg/dL                 9.6-045.4   HDL                  [L]  09.81 mg/dL                 >19.14   LDL Cholesterol      [H]  782 mg/dL                   9-56  Hepatic/Liver Function Panel (HEPATIC)   Total Bilirubin           0.6 mg/dL                   2.1-3.0   Direct Bilirubin          0.1 mg/dL                   8.6-5.7   Alkaline Phosphatase [L]  38 U/L                      39-117   AST                  [H]  42 U/L                      0-37   ALT                       31 U/L                      0-35   Total Protein             6.6 g/dL                    8.4-6.9   Albumin                   3.7 g/dL                    6.2-9.5  BMP (METABOL)   Sodium                    142 mEq/L                    135-145   Potassium                 4.4 mEq/L                   3.5-5.1   Chloride                  108 mEq/L                   96-112   Carbon Dioxide            26 mEq/L  19-32   Glucose                   99 mg/dL                    16-10   BUN                       23 mg/dL                    9-60   Creatinine                1.2 mg/dL                   4.5-4.0   Calcium                   9.3 mg/dL                   9.8-11.9   GFR                       45.69 mL/min                >60  Comments:      CBC Platelet w/Diff (CBCD)   White Cell Count          5.3 K/uL                    4.5-10.5   Red Cell Count            4.02 Mil/uL                 3.87-5.11   Hemoglobin                13.1 g/dL                   14.7-82.9   Hematocrit                38.6 %                      36.0-46.0   MCV                       95.9 fl                     78.0-100.0   Platelet Count            188.0 K/uL                  150.0-400.0   Neutrophil %         [L]  38.2 %                      43.0-77.0   Lymphocyte %              45.3 %                      12.0-46.0   Monocyte %                9.4 %                       3.0-12.0   Eosinophils%         [H]  5.3 %  0.0-5.0   Basophils %               1.8 %                       0.0-3.0   TSH (TSH)   FastTSH                   5.00 uIU/mL                 0.35-5.50   Impression & Recommendations:  Problem # 1:  Hx of ASTHMATIC BRONCHITIS, ACUTE (ICD-466.0) Deanna Olson is doing very well & stopped her prev Advair Rx, no intercurrent infections...  Problem # 2:  HYPERTENSION (ICD-401.9) Controlled>  same meds. Her updated medication list for this problem includes:    Lisinopril 20 Mg Tabs (Lisinopril) .Marland Kitchen... Take 1 tablet by mouth once a day    Furosemide 40 Mg Tabs (Furosemide) .Marland Kitchen... Take one half of a tablet by mouth once daily  Problem # 3:  AORTIC VALVE DISEASE (ICD-424.1) Stable mild AS, no obvious progression &  Deanna Olson is asymptomatic...  Problem # 4:  HYPERLIPIDEMIA (ICD-272.4) FLP slimproved w/ diet, wt reduction & her meds... continue same. Her updated medication list for this problem includes:    Simvastatin 40 Mg Tabs (Simvastatin) .Marland Kitchen... Take 1 tablet by mouth at bedtime    Fenofibrate 160 Mg Tabs (Fenofibrate) .Marland Kitchen... Take one tablet by mouth once daily  Problem # 5:  HYPOTHYROIDISM (ICD-244.9) Stable>  continue same dose... Her updated medication list for this problem includes:    Levothroid 50 Mcg Tabs (Levothyroxine sodium) .Marland Kitchen... Take 1 tablet by mouth once a day  Problem # 6:  COLONIC POLYPS (ICD-211.3) Deanna Olson declines f/u colonoscopy- no GI problems or blood seen...  Problem # 7:  ANXIETY (ICD-300.00) Deanna Olson is under mod stress>  continue the Prn Alpraz... Her updated medication list for this problem includes:    Alprazolam 0.25 Mg Tabs (Alprazolam) .Marland Kitchen... Take 1 tab by mouth three times a day as needed for nerves...  Problem # 8:  OTHER MEDICAL PROBLEMS AS NOTED>>> OK Flu shot, continue vits & supplements...  Complete Medication List: 1)  Lisinopril 20 Mg Tabs (Lisinopril) .... Take 1 tablet by mouth once a day 2)  Furosemide 40 Mg Tabs (Furosemide) .... Take one half of a tablet by mouth once daily 3)  Simvastatin 40 Mg Tabs (Simvastatin) .... Take 1 tablet by mouth at bedtime 4)  Fenofibrate 160 Mg Tabs (Fenofibrate) .... Take one tablet by mouth once daily 5)  Levothroid 50 Mcg Tabs (Levothyroxine sodium) .... Take 1 tablet by mouth once a day 6)  Omeprazole 20 Mg Tbec (Omeprazole) .... Take 1 tablet by mouth every morning 7)  Vitamin D (ergocalciferol) 50000 Unit Caps (Ergocalciferol) .... Take one capsule by mouth once weekly 8)  Alprazolam 0.25 Mg Tabs (Alprazolam) .... Take 1 tab by mouth three times a day as needed for nerves.Marland KitchenMarland Kitchen 9)  Clotrimazole-betamethasone 1-0.05 % Crea (Clotrimazole-betamethasone) .... Apply to rash two times a day as directed... 10)  Desowen 0.05 % Oint  (Desonide) .... Apply to ears as needed  Patient Instructions: 1)  Today we updated your med list- see below.... 2)  Continue your current meds the same... 3)  Please return to our lab one morning this week for your FASTING blood work... please call the "phone tree" in a few days for your lab results.Marland KitchenMarland Kitchen 4)  We gave you the 2011 Flu vaccine today.Marland KitchenMarland Kitchen  5)  Call for any problems.Marland KitchenMarland Kitchen

## 2010-07-16 NOTE — Progress Notes (Signed)
Summary: refill on fenofibrate  Phone Note Call from Patient Call back at Home Phone [phone]   Caller: Patient Call For: nadel Reason for Call: Refill Medication Summary of Call: Needs refill on fenofibrate 160mg , also c/o rash on legs and bottom for approx 1 wk would like a refill on mometasone surroate 0.1%.//[facility] Initial call taken by: Darletta Moll,  April 24, 2010 11:00 AM  Follow-up for Phone Call        pt advised refill sent with fenofibrate and also she has refills already available for cream. Carron Curie CMA  April 24, 2010 11:46 AM     Prescriptions: FENOFIBRATE 160 MG TABS (FENOFIBRATE) take one tablet by mouth once daily  #30 Tablet x 2   Entered by:   Carron Curie CMA   Authorized by:   Michele Mcalpine MD   Signed by:   Carron Curie CMA on 04/24/2010   Method used:   Electronically to        [id number] ([facility]ail)       [address]       [place], [place]  [address]       Ph: [phone] or [phone]       Fax: [phone]   RxID:   [id number]

## 2010-07-16 NOTE — Progress Notes (Signed)
Summary: rash  Phone Note Call from Patient   Caller: Patient Call For: nadel Summary of Call: pt have rash between her legs lost prescript would like something sent to pharmacy [facility] Initial call taken by: Rickard Patience,  February 13, 2010 2:35 PM  Follow-up for Phone Call        called and spoke with pt and she stated that she needed a refill of the cream for a rash.  this has been sent in to her pharmacy and pt is aware Randell Loop CMA  February 13, 2010 3:40 PM     Prescriptions: CLOTRIMAZOLE-BETAMETHASONE 1-0.05 % CREA (CLOTRIMAZOLE-BETAMETHASONE) apply to rash two times a day as directed...  #1 tube x prn   Entered by:   Randell Loop CMA   Authorized by:   Michele Mcalpine MD   Signed by:   Randell Loop CMA on 02/13/2010   Method used:   Electronically to        [id number] ([facility]ail)       [address]       [place], [place]  [address]       Ph: [phone] or [phone]       Fax: [phone]   RxID:   [id number]

## 2010-07-16 NOTE — Progress Notes (Signed)
Summary: results  Phone Note Call from Patient Call back at Home Phone [phone]   Caller: Patient Call For: nadel Summary of Call: pt req lab results. Initial call taken by: Tivis Ringer, CNA,  April 13, 2010 9:43 AM  Follow-up for Phone Call        PT informed that lab results were recorded by Dr Kriste Basque on phone tree.  Pt given instructions to obtain results from phone tree. Abigail Miyamoto RN  April 13, 2010 10:06 AM

## 2010-07-23 ENCOUNTER — Telehealth (INDEPENDENT_AMBULATORY_CARE_PROVIDER_SITE_OTHER): Payer: Self-pay | Admitting: *Deleted

## 2010-07-30 NOTE — Progress Notes (Signed)
Summary: Alprazolam refill request<<<done  Phone Note Refill Request Message from:  Fax from Pharmacy on July 23, 2010 9:41 AM  Refills Requested: Medication #1:  ALPRAZOLAM 0.25 MG  TABS take 1 tab by mouth three times a day as needed for nerves...   Dosage confirmed as above?Dosage Confirmed   Brand Name Necessary? No   Supply Requested: 1 month   Last Refilled: 04/18/2010   Notes: [facility] [address] (p[facility] [phone]   Method Requested: Telephone to Pharmacy Next Appointment Scheduled: no pending appointments Initial call taken by: Michel Bickers CMA,  July 23, 2010 9:42 AM  Follow-up for Phone Call        Pls advise if okay for refills?Michel Bickers [facility]  July 23, 2010 9:44 AM   ok for refills of alprazolam.  please give 5 refills and pt will need ov for further refills Randell Loop [facility]  July 23, 2010 9:48 AM   Additional Follow-up for Phone Call Additional follow up Details #1::        [facility] [address]Michel Bickers [facility]  July 23, 2010 10:58 AM Additional Follow-up by: Michel Bickers CMA,  July 23, 2010 10:58 AM    Prescriptions: ALPRAZOLAM 0.25 MG  TABS (ALPRAZOLAM) take 1 tab by mouth three times a day as needed for nerves...  #90 x 5   Entered by:   Michel Bickers CMA   Authorized by:   Michele Mcalpine MD   Signed by:   Michel Bickers CMA on 07/23/2010   Method used:   Telephoned to ...       [id number] (retail)       [address]       [place], [place]  [address]       Ph: [phone] or [phone]       Fax: [phone]   RxID:   [id number]

## 2010-09-19 ENCOUNTER — Other Ambulatory Visit: Payer: Self-pay | Admitting: Pulmonary Disease

## 2010-10-19 ENCOUNTER — Other Ambulatory Visit: Payer: Self-pay | Admitting: Pulmonary Disease

## 2010-10-30 NOTE — Assessment & Plan Note (Signed)
[facility]                             PULMONARY OFFICE NOTE   MCKENSEY, BERGHUIS                    MRN:          [id number]  DATE:05/23/2006                            DOB:          1926/05/08    HISTORY OF PRESENT ILLNESS:  The patient is a 77 year old white female  patient of Dr. Jodelle Green who complains of a two month history of  persistent intermittent ear pain, right greater than left, hoarseness  and congestion.  The patient was initially seen two months ago and  started on Claritin and Nasacort AQ and was given a Medrol Dose-pack.  The patient reports that she only had minimal improvement in symptoms  and subsequently went and saw EMT on two separate occasions and which  she has most recently been given Cortisporin Otic drops.  Patient  reports symptoms have not improved and continued with persistent  hoarseness, dry cough and right ear pain.  Patient denies any purulent  sputum, fever, chest pain, shortness of breath or overt reflux symptoms.  The patient is maintained on Zantac daily.   PAST MEDICAL HISTORY:  Reviewed.   CURRENT MEDICATIONS:  Reviewed.   PHYSICAL EXAM:  The patient is a pleasant female in no acute distress.  She is afebrile with stable vital signs.  The O2 saturation is 97% on  room air.  HEENT:  Nasal mucosa is pale.  Posterior pharynx is clear without any  exudate or redness.  TMs are normal.  NECK:  Supple without adenopathy.  LUNG SOUNDS:  Clear.  CARDIAC:  Regular rate.  ABDOMEN:  Soft and obese.  EXTREMITIES:  Warm with trace edema.   IMPRESSION AND PLAN:  Acute rhinitis flare and hoarseness.  Patient is  to begin Veramyst two puffs twice daily, a sample and prescription were  given.  Will begin Xyzal 5 mg at bedtime daily times the next two weeks  and then on as needed basis.  Patient also has been given Protonix 40 mg  daily for any residual reflux that could be irritating airways.  Patient  does have a  follow up with Ear, Nose and Throat in four days and is to continue to  follow up with Dr. Haroldine Laws as scheduled or sooner if needed.     Rubye Oaks, NP  Electronically Signed      Lonzo Cloud. Kriste Basque, MD  Electronically Signed   TP/MedQ  DD: 05/23/2006  DT: 05/24/2006  Job #: [id number]

## 2010-10-30 NOTE — Assessment & Plan Note (Signed)
[facility]                               PULMONARY OFFICE NOTE   Deanna Olson, Deanna Olson                    MRN:          [id number]  DATE:03/11/2006                            DOB:          24-Sep-1925    HISTORY OF PRESENT ILLNESS:  Patient is a 77 year old white female patient  of Dr. Jodelle Green who presents for a 2-week history of nasal congestion,  stopped up ears and hoarseness.  Patient denies any purulent sputum, fever,  chest pains, or shortness of breath.  Patient complains that her ears feel  extremely stopped up, especially her right ear.   PAST MEDICAL HISTORY:  Reviewed.   CURRENT MEDICATIONS:  Reviewed.   EXAMINATION:  Patient is a pleasant female in no acute distress.  VITAL SIGNS:  She is afebrile.  Stable vital signs.  HEENT:  Nasal mucosa is pale, nontender sinuses to percussion.  Posterior  pharynx is clear. TMs are normal bilaterally.  Patient has some tenderness  around along the right external ear with a slight redness of the ear canal.  NECK:  Is supple without adenopathy.  No JVD.  LUNGS:  Sounds are clear.  CARDIAC:  Regular rate.  ABDOMEN:  Soft and nontender.  EXTREMITIES:  Without any calf tenderness, cyanosis or clubbing.  There is  trace edema.   IMPRESSION/PLAN:  Acute rhinitis flare and with probable right external  otitis media.  Patient given Nasacort QA 2 puffs twice daily.  Claritin  daily and Mucinex DM twice daily.  Patient may use Cortisporin Otic drops,  three drops to the right ear x7 days.  Patient is to return if no  improvement in symptoms or worsening.      ______________________________  Rubye Oaks, NP    ______________________________  Lonzo Cloud. Kriste Basque, MD     TP/MedQ  DD:  03/11/2006  DT:  03/14/2006  Job #:  [id number]

## 2010-10-30 NOTE — Assessment & Plan Note (Signed)
[facility]                            CARDIOLOGY OFFICE NOTE   Deanna Olson, Deanna Olson                    MRN:          [id number]  DATE:10/06/2006                            DOB:          1926/03/22    PRIMARY CARE PHYSICIAN:  Lonzo Cloud. Kriste Basque, MD   REASON FOR REFERRAL:  Evaluate patient with chest pain and an abnormal  EKG.   HISTORY OF PRESENT ILLNESS:  I saw this patient once before in 11-05-1999. She  had significant cardiovascular risk factors and an abnormal EKG at that  time. She had a stress profusion study which demonstrated an EF of 78%  with no evidence of ischemia or infarct. She has had a murmur noted  since then and in 11/04/2005 had an echocardiogram. This demonstrated mild  aortic stenosis and no other significant abnormalities.   The patient is relatively sedentary. Her most exerting activity is  cooking. She goes grocery shopping with her husband. She has had some  occasional discomfort that she ascribes to reflux. This has been a  stable pattern and very sporadic over the years. She gets up and burps  it off. She has also had some discomfort in her back. This has been  shooting pain under her left scapula. It happens again sporadically and  has been probably a stable pattern. It happens at rest. Usually lasts  for only a few minutes, but it is quite severe when it happens. Some  time last month, she was driving and had an episode of this discomfort.  However, this radiated down both arms. It was severe. It lasted for  several minutes in her arms. The pain in her shoulder again was very  brief. It took her breath away. There was no nausea or vomiting. It  did not feel like previous reflux. She did see Dr. Kriste Basque. Her EKG  demonstrates an interventricular conduction delay more pronounced than  in 1999/11/05. She had lateral T-wave inversions which are unchanged from  previous. 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 This has not changed from 1999-11-05.   The patient again is sedentary. She cannot bring on symptoms with  activity. She does not have any resting shortness of breath, denies any  PND or orthopnea. She has had no palpitations, pre-syncope, or syncope.   PAST MEDICAL HISTORY:  1. Gastroesophageal reflux disease.  2. Mastitis.  3. Hyperlipidemia.  4. Hypertension x1 year.   PAST SURGICAL HISTORY:  Hysterectomy.   ALLERGIES:  SULFA CAUSED FACIAL SWELLING.   MEDICATIONS:  1. Omeprazole 20 mg b.i.d.  2. Levothyroxine 50 mcg daily.  3. Furosemide 10 mg daily.  4. Lisinopril 20 mg daily.  5. Simvastatin 40 mg daily.  6. Zantac p.r.n.  7. Lunesta q nightly p.r.n.   REVIEW OF SYSTEMS:  As stated in the HPI and otherwise negative for  other systems.   PHYSICAL EXAMINATION:  The patient is in no distress. Blood pressure  156/72, heart rate 72 and regular. Weight 199 pounds. Body mass index is  33.  HEENT: Eyelids unremarkable. Pupils equal,  round, and reactive to light.  Fundi not visualized. Oral mucosa is unremarkable.  NECK: No jugular venous distention, waveform within normal limits.  Carotid upstroke brisk and symmetrical. No bruits, no thyromegaly.  LYMPHATICS: No cervical, axillary or inguinal adenopathy.  LUNGS: Clear to auscultation bilaterally.  BACK: No costovertebral angle tenderness.  CHEST: Unremarkable.  HEART: PMI not displaced or sustained. S1, S2 within normal limits. No  S3. No S4. A 2/6 apical systolic murmur and heard best at the right  upper sternal border, early peaking. No diastolic murmurs.  ABDOMEN: Obese, positive bowel sounds, normal in frequency and pitch. No  bruits. No rebounds. No guarding. No midline pulsatile mass. No  hepatomegaly, splenomegaly.  SKIN: No rashes, no nodules.  EXTREMITIES: 2+ pulses throughout. No cyanosis or clubbing. No edema.  NEURO: Oriented to person, place and time. Cranial nerves II-XII grossly  intact.  Motor grossly intact throughout.   ASSESSMENT/PLAN:  1. Chest discomfort: The patient's chest discomfort is somewhat      atypical. However, there are some worrisome features with radiation      down her arms. She has significant cardiovascular risk factors. She      does have an abnormal EKG, although it is not changed from 2001.      Based on the above, I think stress testing is indicated. The      patient says she would not walk on a treadmill as she had a      difficult time with it seven years ago. Therefore, she will need an      adenosine perfusion study. Further evaluation will based on these      results.  2. Hypertension: Her blood pressure is slightly elevated. Reviewing      recent office visits, it has not been that high. Her daughter has a      blood pressure cuff and can take her blood pressure a few times a      week. She can keep a blood pressure diary and present this to Dr.      Kriste Basque. Hopefully, with weight loss she would achieve an ideal blood      pressure.  3. Obesity: We had a discussion about the need to lose weight with      diet and exercise. She is extremely sedentary for no good reason.      She is encouraged to start walking 3 to 5 days per week (preferably      5).  4. Aortic stenosis: The patient had some mild aortic stenosis on an      echo in 2007. She is not having any symptoms related to this. The      physical examination would not suggest this to be worse. This can      be followed clinically and with echocardiograms periodically.   FOLLOWUP:  Will see the patient back as needed based on the results of  the above.     Rollene Rotunda, MD, [facility]  Electronically Signed    JH/MedQ  DD: 10/06/2006  DT: 10/06/2006  Job #: [id number]   cc:   Lonzo Cloud. Kriste Basque, MD

## 2010-11-27 ENCOUNTER — Other Ambulatory Visit: Payer: Self-pay | Admitting: Pulmonary Disease

## 2010-12-09 ENCOUNTER — Other Ambulatory Visit: Payer: Self-pay | Admitting: Pulmonary Disease

## 2011-01-04 ENCOUNTER — Telehealth: Payer: Self-pay | Admitting: Pulmonary Disease

## 2011-01-04 DIAGNOSIS — I10 Essential (primary) hypertension: Secondary | ICD-10-CM

## 2011-01-04 DIAGNOSIS — E559 Vitamin D deficiency, unspecified: Secondary | ICD-10-CM

## 2011-01-04 DIAGNOSIS — D126 Benign neoplasm of colon, unspecified: Secondary | ICD-10-CM

## 2011-01-04 DIAGNOSIS — E039 Hypothyroidism, unspecified: Secondary | ICD-10-CM

## 2011-01-04 DIAGNOSIS — E785 Hyperlipidemia, unspecified: Secondary | ICD-10-CM

## 2011-01-04 NOTE — Telephone Encounter (Signed)
Spoke with pt. She has appt sched for 11:30 on 01/06/11- wanting to know if she needs to have labs done prior to her appt. Wants to at least have her kidney dx checked. Please advise thanks

## 2011-01-04 NOTE — Telephone Encounter (Signed)
Called and spoke with pt and she is aware of labs in the computer for her on Wednesday.

## 2011-01-06 ENCOUNTER — Other Ambulatory Visit (INDEPENDENT_AMBULATORY_CARE_PROVIDER_SITE_OTHER): Payer: Medicare Other

## 2011-01-06 ENCOUNTER — Encounter: Payer: Self-pay | Admitting: Adult Health

## 2011-01-06 ENCOUNTER — Ambulatory Visit (INDEPENDENT_AMBULATORY_CARE_PROVIDER_SITE_OTHER): Payer: Medicare Other | Admitting: Adult Health

## 2011-01-06 DIAGNOSIS — E785 Hyperlipidemia, unspecified: Secondary | ICD-10-CM

## 2011-01-06 DIAGNOSIS — E559 Vitamin D deficiency, unspecified: Secondary | ICD-10-CM

## 2011-01-06 DIAGNOSIS — E039 Hypothyroidism, unspecified: Secondary | ICD-10-CM

## 2011-01-06 DIAGNOSIS — I10 Essential (primary) hypertension: Secondary | ICD-10-CM

## 2011-01-06 DIAGNOSIS — B379 Candidiasis, unspecified: Secondary | ICD-10-CM

## 2011-01-06 DIAGNOSIS — B372 Candidiasis of skin and nail: Secondary | ICD-10-CM | POA: Insufficient documentation

## 2011-01-06 DIAGNOSIS — D126 Benign neoplasm of colon, unspecified: Secondary | ICD-10-CM

## 2011-01-06 LAB — CBC WITH DIFFERENTIAL/PLATELET
Basophils Relative: 1.1 % (ref 0.0–3.0)
Eosinophils Relative: 4.1 % (ref 0.0–5.0)
Hemoglobin: 12.6 g/dL (ref 12.0–15.0)
Lymphocytes Relative: 42.5 % (ref 12.0–46.0)
Monocytes Relative: 8.5 % (ref 3.0–12.0)
Neutrophils Relative %: 43.8 % (ref 43.0–77.0)
RBC: 3.98 Mil/uL (ref 3.87–5.11)
WBC: 5 10*3/uL (ref 4.5–10.5)

## 2011-01-06 LAB — HEPATIC FUNCTION PANEL
ALT: 29 U/L (ref 0–35)
AST: 37 U/L (ref 0–37)
Albumin: 4.1 g/dL (ref 3.5–5.2)
Total Bilirubin: 0.5 mg/dL (ref 0.3–1.2)
Total Protein: 7.6 g/dL (ref 6.0–8.3)

## 2011-01-06 LAB — BASIC METABOLIC PANEL
BUN: 25 mg/dL — ABNORMAL HIGH (ref 6–23)
CO2: 28 mEq/L (ref 19–32)
Chloride: 104 mEq/L (ref 96–112)
Glucose, Bld: 107 mg/dL — ABNORMAL HIGH (ref 70–99)
Potassium: 4.2 mEq/L (ref 3.5–5.1)

## 2011-01-06 LAB — LIPID PANEL
LDL Cholesterol: 105 mg/dL — ABNORMAL HIGH (ref 0–99)
VLDL: 23 mg/dL (ref 0.0–40.0)

## 2011-01-06 LAB — TSH: TSH: 3.89 u[IU]/mL (ref 0.35–5.50)

## 2011-01-06 MED ORDER — CLOTRIMAZOLE-BETAMETHASONE 1-0.05 % EX CREA: TOPICAL_CREAM | Freq: Two times a day (BID) | CUTANEOUS | Status: DC

## 2011-01-06 NOTE — Assessment & Plan Note (Signed)
Controlled on rx   

## 2011-01-06 NOTE — Assessment & Plan Note (Signed)
Diet and weight oss  Labs pending.

## 2011-01-06 NOTE — Assessment & Plan Note (Signed)
Lotrisone Twice daily  Until rash gone.

## 2011-01-06 NOTE — Progress Notes (Signed)
Subjective:    Patient ID: Deanna Olson, female    DOB: 06/27/1925, 77 y.o.   MRN: [id number]  HPI 77 y/o WF here for a follow up visit... she has mult med problems as noted below...  ~ July 25, 2008: she has had quiet a bit of trouble w/ her ears- 3 visits to [facility], ENT & finally improved- she reports Rx w/ ear drops and antibiotics... also saw DrGruber for Derm- Rx w/ cream for ears + shampoo... here for f/u AB, HBP, Valvular Hrt Dis, Chol, Thyroid, etc... doing well overall, no new complaints or concerns... she is not fasting today & will ret next week for FLabs...   ~ March 26, 2009: 75mo follow up- doing reasonably well, no new complaints or concerns, needs a few refills and Flu shot today... she's under considerable stress w/ husb's illness & need to go weekly to wound clinic (she has Alprazolam for Prn use)...   ~ April 01, 2010: Yearly ROV- still under stress caring for husb, but she has been doing well w/o new complaints or concerns... notes occas HA's c/w muscle contraction & refief by ASA & Alpraz... BP controlled on meds; hx mild AS w/o dizzy, syncope, CP, palpit, ch in dyspnea; Lipids improved w/ diet +meds; Thyroid stable; others OK as well- she wants Flu shot today & will ret for fasting blood work.   ~01/06/2011 Follow up  Pt presents for routine follow up . She had fasting labs this am-results not available today.  Complains of rash along inner groin , lower abdominal under skin folds. She says she is under a lot of stress with  Husbands chronic leg wounds and medical expenses. Denies chest pain, abdominal pain or bloody stools.  Declines mammograms.   Current Problem List:  ALLERGIC RHINITIS (ICD-477.9) - she uses OTC Claritin as needed.   Hx of ASTHMATIC BRONCHITIS, ACUTE (ICD-466.0) - prev on Advair- using Prn & doing well without AB exac.   HYPERTENSION (ICD-401.9) - on LISINOPRIL 20mg /d and LASIX 40mg - taking 1/2 tab daily...  AORTIC VALVE DISEASE  (ICD-424.1) - Cardiology eval 2008 by DrHochrein... known mild Ao Stenosis w/ 2DEcho 6/07 showing mild incr AoV thickness, mild reduced AoV leaflet excursions, AoV sclerosis w/o stenosis, norm LVF w/ EF= 65%.  ~ NuclearStressTest 5/08 was neg- no ischemia, no infarct, EF= 80%...  ~ 2DEcho 10/09 showed norm LV sys function w/ EF= 55-60%, no regional wall motion abn, mild incr LVwall thickness, mild DD, mild calcif AoV, low-nl leaflet excursion- c/w mild AS.  ~ note- CDopplers 10/09 were OK- mild plaque on right, 0-39% bilat...   VENOUS INSUFFICIENCY (ICD-459.81) - she follows a low sodium diet, elevates legs and uses support hose as needed.   HYPERLIPIDEMIA (ICD-272.4) - on SIMVASTATIN 40mg /d & FENOFIBRATE 160mg /d... + low fat diet... weight down 2# to 191#.  ~ FLP 2/08 showed TChol 155, TG 227, HDL 35, LDL 100  ~ FLP 5/09 showed TChol 175, TG 170, HDL 30, LDL 111  ~ FLP 2/10 showed TChol 181, TG 198, HDL 35, LDL 106... rec> add FENOFIBRATE 160mg /d.  ~ FLP 10/10 showed TChol 183, TG 109, HDL 40, LDL 121  ~ FLP 10/11 showed TChol 177, TG 135, HDL 34, LDL 116   HYPOTHYROIDISM (WUJ-811.9) - on LEVOTHYROID 17mcg/d... and clinically euthyroid...  ~ last TSH 2/08 was 3.87  ~ labs 5/09 showed TSH= 3.55  ~ labs 10/10 showed TSH= 4.42  ~ labs 10/11 showed TSH= 5.00   GERD (ICD-530.81) - on  OMEPRAZOLE 20mg /d...   ~ EGD in 1989 by DrPatterson showed smHH, otherw neg... subseq 24H pH probe was normal...  COLONIC POLYPS (ICD-211.3) - tiny hyperplastic polyp removed via FlexSig 1994..  DEGENERATIVE JOINT DISEASE (ICD-715.90) - eval by DrOlin 2006 w/ bilat knee osteoarthritis... she uses OTC meds for pain...   FIBROMYALGIA (ICD-729.1) - not currently on meds...   ANXIETY (ICD-300.00) - uses ALPRAZOLAM 0.25mg  as needed... under stress w/ MrLinker going to the wound care clinic...   Hx of SHINGLES (ICD-053.9)   RASH - hx  intertriginous rash & we will attempt Rx w/ LOTRISONE Cream Bid & she uses  DESONIDE 0.05% for ears...      Review of Systems Constitutional:   No  weight loss, night sweats,  Fevers, chills, fatigue, or  lassitude.  HEENT:   No headaches,  Difficulty swallowing,  Tooth/dental problems, or  Sore throat,                No sneezing, itching, ear ache, nasal congestion, post nasal drip,   CV:  No chest pain,  Orthopnea, PND, swelling in lower extremities, anasarca, dizziness, palpitations, syncope.   GI  No heartburn, indigestion, abdominal pain, nausea, vomiting, diarrhea, change in bowel habits, loss of appetite, bloody stools.   Resp: No shortness of breath with exertion or at rest.  No excess mucus, no productive cough,  No non-productive cough,  No coughing up of blood.  No change in color of mucus.  No wheezing.  No chest wall deformity  Skin: +rash .  GU: no dysuria, change in color of urine, no urgency or frequency.  No flank pain, no hematuria   MS:  No joint pain or swelling.  No decreased range of motion.  No back pain.  Psych:  No change in mood or affect.  No memory loss.         Objective:   Physical Exam GEN: A/Ox3; pleasant , NAD, overweight   HEENT:  [place]/AT,  EACs-clear, TMs-wnl, NOSE-clear, THROAT-clear, no lesions, no postnasal drip or exudate noted.   NECK:  Supple w/ fair ROM; no JVD; normal carotid impulses w/o bruits; no thyromegaly or nodules palpated; no lymphadenopathy.  RESP  Clear  P & A; w/o, wheezes/ rales/ or rhonchi.no accessory muscle use, no dullness to percussion  CARD:  RRR, no m/r/g  , no peripheral edema, pulses intact, no cyanosis or clubbing.  GI:   Soft & nt; nml bowel sounds; no organomegaly or masses detected.  Musco: Warm bil, no deformities or joint swelling noted.   Neuro: alert, no focal deficits noted.    Skin: Warm, no lesions or rashes         Assessment & Plan:

## 2011-01-06 NOTE — Assessment & Plan Note (Signed)
Labs pending.  

## 2011-01-06 NOTE — Patient Instructions (Addendum)
I will call with labs results.  Cotton underclothes  Keep skin dry frequently Use Lotrisone cream apply Twice daily  Until rash is gone.  Please contact office for sooner follow up if symptoms do not improve or worsen or seek emergency care   follow up Dr. Kriste Basque  In 6 months and As needed

## 2011-01-22 ENCOUNTER — Other Ambulatory Visit: Payer: Self-pay | Admitting: Pulmonary Disease

## 2011-02-21 ENCOUNTER — Other Ambulatory Visit: Payer: Self-pay | Admitting: Pulmonary Disease

## 2011-03-05 ENCOUNTER — Telehealth: Payer: Self-pay | Admitting: Pulmonary Disease

## 2011-03-05 MED ORDER — ALPRAZOLAM 0.25 MG PO TABS: 0.2500 mg | ORAL_TABLET | Freq: Three times a day (TID) | ORAL | Status: DC | PRN

## 2011-03-05 NOTE — Telephone Encounter (Signed)
I called rx with 2 refills to pharmacy. I attempted to call patient but unable to get an answer or leave a message. Need to try again Monday.

## 2011-03-08 NOTE — Telephone Encounter (Signed)
Spoke with pt and made aware rx was called in. She states had already picked med up and nothing further needed.

## 2011-03-17 ENCOUNTER — Ambulatory Visit (INDEPENDENT_AMBULATORY_CARE_PROVIDER_SITE_OTHER): Payer: Medicare Other

## 2011-03-17 DIAGNOSIS — Z23 Encounter for immunization: Secondary | ICD-10-CM

## 2011-04-15 ENCOUNTER — Other Ambulatory Visit: Payer: Self-pay | Admitting: Pulmonary Disease

## 2011-05-24 ENCOUNTER — Other Ambulatory Visit: Payer: Self-pay | Admitting: Pulmonary Disease

## 2011-06-15 HISTORY — PX: CATARACT EXTRACTION, BILATERAL: SHX1313

## 2011-06-16 ENCOUNTER — Other Ambulatory Visit: Payer: Self-pay | Admitting: Pulmonary Disease

## 2011-07-07 ENCOUNTER — Telehealth: Payer: Self-pay | Admitting: Pulmonary Disease

## 2011-07-07 DIAGNOSIS — E039 Hypothyroidism, unspecified: Secondary | ICD-10-CM

## 2011-07-07 DIAGNOSIS — I10 Essential (primary) hypertension: Secondary | ICD-10-CM

## 2011-07-07 DIAGNOSIS — E785 Hyperlipidemia, unspecified: Secondary | ICD-10-CM

## 2011-07-07 NOTE — Telephone Encounter (Signed)
Pt notified labs ordered.

## 2011-07-07 NOTE — Telephone Encounter (Signed)
Labs have been placed in the computer for the pt.  thanks 

## 2011-07-07 NOTE — Telephone Encounter (Signed)
Pt has an appt on Friday and wants labs done before. Pt last seen by Dr. Kriste Basque 04-01-2010.  Please advise on what labs to enter. Carron Curie, CMA

## 2011-07-09 ENCOUNTER — Encounter: Payer: Self-pay | Admitting: Pulmonary Disease

## 2011-07-09 ENCOUNTER — Ambulatory Visit (INDEPENDENT_AMBULATORY_CARE_PROVIDER_SITE_OTHER): Payer: Medicare Other | Admitting: Pulmonary Disease

## 2011-07-09 ENCOUNTER — Other Ambulatory Visit (INDEPENDENT_AMBULATORY_CARE_PROVIDER_SITE_OTHER): Payer: Medicare Other

## 2011-07-09 DIAGNOSIS — E039 Hypothyroidism, unspecified: Secondary | ICD-10-CM

## 2011-07-09 DIAGNOSIS — M1990 Unspecified osteoarthritis, unspecified site: Secondary | ICD-10-CM

## 2011-07-09 DIAGNOSIS — I10 Essential (primary) hypertension: Secondary | ICD-10-CM

## 2011-07-09 DIAGNOSIS — E785 Hyperlipidemia, unspecified: Secondary | ICD-10-CM

## 2011-07-09 DIAGNOSIS — K219 Gastro-esophageal reflux disease without esophagitis: Secondary | ICD-10-CM

## 2011-07-09 DIAGNOSIS — F411 Generalized anxiety disorder: Secondary | ICD-10-CM

## 2011-07-09 DIAGNOSIS — E559 Vitamin D deficiency, unspecified: Secondary | ICD-10-CM

## 2011-07-09 DIAGNOSIS — I359 Nonrheumatic aortic valve disorder, unspecified: Secondary | ICD-10-CM

## 2011-07-09 DIAGNOSIS — D126 Benign neoplasm of colon, unspecified: Secondary | ICD-10-CM

## 2011-07-09 DIAGNOSIS — I872 Venous insufficiency (chronic) (peripheral): Secondary | ICD-10-CM

## 2011-07-09 DIAGNOSIS — J209 Acute bronchitis, unspecified: Secondary | ICD-10-CM

## 2011-07-09 LAB — BASIC METABOLIC PANEL
BUN: 27 mg/dL — ABNORMAL HIGH (ref 6–23)
CO2: 28 mEq/L (ref 19–32)
Calcium: 9.7 mg/dL (ref 8.4–10.5)
GFR: 38.77 mL/min — ABNORMAL LOW (ref >60.00)
Glucose, Bld: 98 mg/dL (ref 70–99)

## 2011-07-09 LAB — LIPID PANEL
Cholesterol: 183 mg/dL (ref 0–200)
HDL: 45.1 mg/dL (ref >39.00)
LDL Cholesterol: 115 mg/dL — ABNORMAL HIGH (ref 0–99)
Triglycerides: 116 mg/dL (ref 0.0–149.0)
VLDL: 23.2 mg/dL (ref 0.0–40.0)

## 2011-07-09 NOTE — Progress Notes (Signed)
Subjective:     Patient ID: Deanna Olson, female   DOB: [birth year], 78 y.o.   MRN: [id number]  HPI 78 y/o WF here for a follow up visit... she has mult med problems as noted below...  ~  July 25, 2008:  she has had quiet a bit of trouble w/ her ears- 3 visits to [facility], ENT & finally improved- she reports Rx w/ ear drops and antibiotics... also saw DrGruber for Derm- Rx w/ cream for ears + shampoo... here for f/u AB, HBP, Valvular Hrt Dis, Chol, Thyroid, etc... doing well overall, no new complaints or concerns... she is not fasting today & will ret next week for FLabs...  ~  March 26, 2009:  46mo follow up- doing reasonably well, no new complaints or concerns, needs a few refills and Flu shot today... she's under considerable stress w/ husb's illness & need to go weekly to wound clinic (she has Alprazolam for Prn use)...  ~  April 01, 2010:  Yearly ROV- still under stress caring for husb, but she has been doing well w/o new complaints or concerns... notes occas HA's c/w muscle contraction & refief by ASA & Alpraz...  BP controlled on meds;  hx mild AS w/o dizzy, syncope, CP, palpit, ch in dyspnea;  Lipids improved w/ diet +meds;  Thyroid stable;  others OK as well- she wants Flu shot today & will ret for fasting blood work.  ~  July 09, 2011:  58mo ROV & she reports doing pretty well w/o new complaints or concerns; she is stressed to the max w/ husb's illness & his care needs; She had the 2012 Flu vaccine 10/12, we did Fasting blood work today & we will sched a follow up 2DEchocardiogram- See prob list below>>           Problem List:  ALLERGIC RHINITIS (ICD-477.9) - she uses OTC Claritin as needed.  Hx of ASTHMATIC BRONCHITIS, ACUTE (ICD-466.0) - prev on Advair- using Prn & doing well & denies breathing problems- without AB exac.  HYPERTENSION (ICD-401.9) - on LISINOPRIL 20mg /d and LASIX 40mg - taking 1/2 tab daily...  ~  10/11: BP= 120/60 and doing well... denies HA, fatigue,  visual changes, CP, palipit, dizziness, syncope, dyspnea, edema, etc... ~  1/13: BP= 122/68 & she remains asymptomatic...  AORTIC VALVE DISEASE (ICD-424.1) - Cardiology eval 2008 by DrHochrein... known mild Ao Stenosis w/ 2DEcho 6/07 showing mild incr AoV thickness, mild reduced AoV leaflet excursions, AoV sclerosis w/o stenosis, norm LVF w/ EF= 65%. ~  NuclearStressTest 5/08 was neg- no ischemia, no infarct, EF= 80%... ~  2DEcho 10/09 showed norm LV sys function w/ EF= 55-60%, no regional wall motion abn, mild incr LVwall thickness, mild DD, mild calcif AoV, low-nl leaflet excursion- c/w mild AS. ~  note- CDopplers 10/09 were OK- mild plaque on right, 0-39% bilat... ~  1/13: she is in need of a f/u 2DEcho to check her AS- murmur sl louder now- denies CP, palpit,dizzy, syncope, SOB, edema, or cerebral ischemic symptoms...  VENOUS INSUFFICIENCY (ICD-459.81) - she follows a low sodium diet, elevates legs and uses support hose as needed.  HYPERLIPIDEMIA (ICD-272.4) - on SIMVASTATIN 40mg /d & FENOFIBRATE 160mg /d... + low fat diet... weight down 2# to 191#. ~  FLP 2/08 showed TChol 155, TG 227, HDL 35, LDL 100 ~  FLP 5/09 showed TChol 175, TG 170, HDL 30, LDL 111 ~  FLP 2/10 showed TChol 181, TG 198, HDL 35, LDL 106... rec> add FENOFIBRATE 160mg /d. ~  FLP 10/10 showed TChol 183, TG 109, HDL 40, LDL 121 ~  FLP 10/11 on Simva40+FENO160 showed TChol 177, TG 135, HDL 34, LDL 116 ~  FLP 7/12 on Simva40+FENO160 showed TChol 169, TG 115, HDL 41, LDL 105 ~  FLP 1/13 on Simva40+Feno160 showed TChol 183, TG 116, HDL 45, LDL 115  HYPOTHYROIDISM (ICD-244.9) - on LEVOTHYROID 27mcg/d... and clinically euthyroid... ~  last TSH 2/08 was 3.87 ~  labs 5/09 showed TSH= 3.55 ~  labs 10/10 showed TSH= 4.42 ~  labs 10/11 showed TSH= 5.00 ~  Labs 7/12 on Levo50 showed TSH= 3.89 ~  Labs 1/13 on Levo50 showed TSH= 5.86... Reminded to take it every day...  GERD (ICD-530.81) - on OMEPRAZOLE 20mg /d... she is  asymptomatic... ~  EGD in 1989 by DrPatterson showed smHH, otherw neg... subseq 24H pH probe was normal...  COLONIC POLYPS (ICD-211.3) - tiny hyperplastic polyp removed via FlexSig 1994... no f/u since then, but she denies abd discomfort, d/c/bl etc...  DEGENERATIVE JOINT DISEASE (ICD-715.90) - eval by DrOlin 2006 w/ bilat knee osteoarthritis... she uses OTC meds for pain... ~  1/13: she notes some intermittent LBP & uses husb's Vicodin 1/2 tab prn...  FIBROMYALGIA (ICD-729.1) - not currently on meds...  ANXIETY (ICD-300.00) - uses ALPRAZOLAM 0.25mg  as needed... under stress w/ MrLinker going to the wound care clinic...  Hx of SHINGLES (ICD-053.9)  RASH - she sees DrLupton for Derm> she notes intertriginous rash & we will attempt Rx w/ LOTRISONE Cream Bid & she uses DESONIDE 0.05% for ears...   Past Surgical History  Procedure Date  . Vesicovaginal fistula closure w/ tah     Outpatient Encounter Prescriptions as of 07/09/2011  Medication Sig Dispense Refill  . ALPRAZolam (XANAX) 0.25 MG tablet Take 1 tablet (0.25 mg total) by mouth 3 (three) times daily as needed.  90 tablet  2  . clobetasol (TEMOVATE) 0.05 % external solution Use as directed for scale itch      . desonide (DESOWEN) 0.05 % cream Apply 1 application topically 2 (two) times daily.      . ergocalciferol (VITAMIN D2) 50000 UNITS capsule Take 50,000 Units by mouth once a week.        . fenofibrate 160 MG tablet TAKE ONE TABLET BY MOUTH ONCE DAILY  30 tablet  6  . furosemide (LASIX) 40 MG tablet TAKE 1 TABLET EVERY DAY  30 tablet  5  . ketoconazole (NIZORAL) 2 % shampoo Apply as directed      . levothyroxine (SYNTHROID, LEVOTHROID) 50 MCG tablet TAKE 1 TABLET EVERY DAY  30 tablet  5  . lisinopril (PRINIVIL,ZESTRIL) 20 MG tablet TAKE 1 TABLET EVERY DAY  30 tablet  4  . omeprazole (PRILOSEC) 20 MG capsule TAKE 1 CAPSULE BY MOUTH EVERY MORNING  30 capsule  4  . propoxyphene-acetaminophen (DARVOCET-N 100) 100-650 MG per tablet  Take 1 tablet by mouth every 6 (six) hours as needed.      . simvastatin (ZOCOR) 40 MG tablet TAKE 1 TABLET AT BEDTIME  30 tablet  5  . vitamin C (ASCORBIC ACID) 500 MG tablet Take 500 mg by mouth daily.      . Vitamin E 400 UNITS TABS Take 1 tablet by mouth daily.      Marland Kitchen DISCONTD: clotrimazole-betamethasone (LOTRISONE) cream Apply topically 2 (two) times daily.  30 g  2  . DISCONTD: zinc oxide 20 % ointment Apply topically as needed.          Allergies  Allergen Reactions  . Sulfonamide Derivatives     REACTION: angioedema    Current Medications, Allergies, Past Medical History, Past Surgical History, Family History, and Social History were reviewed in [employer] record.    Review of Systems       See HPI - all other systems neg except as noted...  The patient denies anorexia, fever, weight loss, weight gain, vision loss, decreased hearing, hoarseness, chest pain, syncope, dyspnea on exertion, peripheral edema, prolonged cough, headaches, hemoptysis, abdominal pain, melena, hematochezia, severe indigestion/heartburn, hematuria, incontinence, muscle weakness, suspicious skin lesions, transient blindness, difficulty walking, depression, unusual weight change, abnormal bleeding, enlarged lymph nodes, and angioedema.     Objective:   Physical Exam     WD, Overweight, 78 y/o WF in NAD...  GENERAL:  Alert & oriented; pleasant & cooperative... HEENT:  [place]/AT, EOM-wnl, PERRLA, EACs-clear, TMs-wnl, NOSE-clear, THROAT-clear & wnl. NECK:  Supple w/ fairROM; no JVD; no thyromegaly or nodules palpated; no lymphadenopathy;  transmit murmur in base of neck to carotids... CHEST:  Clear to P & A; without wheezes/ rales/ or rhonchi. HEART:  Regular Rhythm;  gr1-2/6 SEM at base w/ rad to neck; without rubs or gallops heard... ABDOMEN:  Soft & nontender; + panniculus; normal bowel sounds; no organomegaly or masses detected. EXT: without deformities, mild arthritic changes; no  varicose veins/ +venous insuffic/ tr. -1+ edema. NEURO:  CN's intact;  no focal neuro deficits... DERM:  Intertrig rash on groin/ abd...  RADIOLOGY DATA:  Reviewed in the EPIC EMR & discussed w/ the patient...    >>Last CXR 4/08 showed normal heart size, clear lungs, NAD...  LABORATORY DATA:  Reviewed in the EPIC EMR & discussed w/ the patient...   Assessment:     Hx AB & AR>  No problems, no recent resp exac & doing well overall w/o meds required...  HBP>  Controlled on Lisinopril & Lasix; she remains asymptomatic...  Ao Valve dis>  She has mild AS & is due for f/u 2DEcho ==> pending  Ven Insuffic>  She knows to elim sodium, elevate legs, wear support hose...  Hyperlipidemia>  On Simva40 + Fenofib160;  Labs reviewed, needs better diet, exercise, get wt down...  Hypothyroid>  On Levothyroid 24mcg/d & clinically euthyroid; reminded to take med every day...  GERD>  On Omep & doing well...  DJD/ LBP>  She has mild ortho complaints; uses 1/2 vicodin for relief as needed...  Anxiety>  On Alprazolam as needed...   Derm>  Rash treated by DrLupton...     Plan:     Patient's Medications  New Prescriptions   No medications on file  Previous Medications   ALPRAZOLAM (XANAX) 0.25 MG TABLET    Take 1 tablet (0.25 mg total) by mouth 3 (three) times daily as needed.   CLOBETASOL (TEMOVATE) 0.05 % EXTERNAL SOLUTION    Use as directed for scale itch   DESONIDE (DESOWEN) 0.05 % CREAM    Apply 1 application topically 2 (two) times daily.   ERGOCALCIFEROL (VITAMIN D2) 50000 UNITS CAPSULE    Take 50,000 Units by mouth once a week.     FENOFIBRATE 160 MG TABLET    TAKE ONE TABLET BY MOUTH ONCE DAILY   FUROSEMIDE (LASIX) 40 MG TABLET    TAKE 1 TABLET EVERY DAY   KETOCONAZOLE (NIZORAL) 2 % SHAMPOO    Apply as directed   LEVOTHYROXINE (SYNTHROID, LEVOTHROID) 50 MCG TABLET    TAKE 1 TABLET EVERY DAY   LISINOPRIL (PRINIVIL,ZESTRIL) 20  MG TABLET    TAKE 1 TABLET EVERY DAY   OMEPRAZOLE (PRILOSEC)  20 MG CAPSULE    TAKE 1 CAPSULE BY MOUTH EVERY MORNING   PROPOXYPHENE-ACETAMINOPHEN (DARVOCET-N 100) 100-650 MG PER TABLET    Take 1 tablet by mouth every 6 (six) hours as needed.   SIMVASTATIN (ZOCOR) 40 MG TABLET    TAKE 1 TABLET AT BEDTIME   VITAMIN C (ASCORBIC ACID) 500 MG TABLET    Take 500 mg by mouth daily.   VITAMIN E 400 UNITS TABS    Take 1 tablet by mouth daily.  Modified Medications   No medications on file  Discontinued Medications   CLOTRIMAZOLE-BETAMETHASONE (LOTRISONE) CREAM    Apply topically 2 (two) times daily.   ZINC OXIDE 20 % OINTMENT    Apply topically as needed.

## 2011-07-09 NOTE — Patient Instructions (Signed)
Today we updated your med list in our EPIC system...    Continue your current medications the same...  Today we did your follow up fasting blood work...    Please call the [facility]REE in a few days for your results...    Dial [id number] & when prompted enter your patient number followed by the # symbol...    Your patient number is:  [id number]#  We will arrange for a follow up @DEchocardiogram  to recheck your Aortic Valve...  Call for any questions...  Let's plan a follow up visit in about 6 months.Marland KitchenMarland Kitchen

## 2011-07-11 ENCOUNTER — Encounter: Payer: Self-pay | Admitting: Pulmonary Disease

## 2011-08-02 ENCOUNTER — Other Ambulatory Visit: Payer: Self-pay | Admitting: Pulmonary Disease

## 2011-08-03 ENCOUNTER — Other Ambulatory Visit: Payer: Self-pay

## 2011-08-03 ENCOUNTER — Ambulatory Visit (HOSPITAL_COMMUNITY): Payer: Medicare Other | Attending: Cardiology

## 2011-08-03 DIAGNOSIS — I10 Essential (primary) hypertension: Secondary | ICD-10-CM | POA: Insufficient documentation

## 2011-08-03 DIAGNOSIS — E669 Obesity, unspecified: Secondary | ICD-10-CM | POA: Insufficient documentation

## 2011-08-03 DIAGNOSIS — I080 Rheumatic disorders of both mitral and aortic valves: Secondary | ICD-10-CM | POA: Insufficient documentation

## 2011-08-03 DIAGNOSIS — I359 Nonrheumatic aortic valve disorder, unspecified: Secondary | ICD-10-CM

## 2011-08-03 DIAGNOSIS — I079 Rheumatic tricuspid valve disease, unspecified: Secondary | ICD-10-CM | POA: Insufficient documentation

## 2011-08-03 DIAGNOSIS — E785 Hyperlipidemia, unspecified: Secondary | ICD-10-CM | POA: Insufficient documentation

## 2011-08-16 ENCOUNTER — Encounter: Payer: Self-pay | Admitting: Adult Health

## 2011-08-16 ENCOUNTER — Ambulatory Visit (INDEPENDENT_AMBULATORY_CARE_PROVIDER_SITE_OTHER): Payer: Medicare Other | Admitting: Adult Health

## 2011-08-16 VITALS — BP 138/78 | HR 58 | Temp 97.1°F | Ht 64.5 in | Wt 181.2 lb

## 2011-08-16 DIAGNOSIS — H6120 Impacted cerumen, unspecified ear: Secondary | ICD-10-CM

## 2011-08-16 NOTE — Assessment & Plan Note (Signed)
Bilateral cerumen impaction  Removed without diffiuclty with ear irrigation on left , partial removal on right with improvement Tolerated well without difficulty Please contact office for sooner follow up if symptoms do not improve or worsen or seek emergency care  Advised to use debrox as needed

## 2011-08-16 NOTE — Progress Notes (Signed)
Subjective:     Patient ID: Deanna Olson, female   DOB: Sep 30, 1925, 78 y.o.   MRN: [id number]  HPI  78 y/o WF    ~  July 25, 2008:  she has had quiet a bit of trouble w/ her ears- 3 visits to [facility], ENT & finally improved- she reports Rx w/ ear drops and antibiotics... also saw DrGruber for Derm- Rx w/ cream for ears + shampoo... here for f/u AB, HBP, Valvular Hrt Dis, Chol, Thyroid, etc... doing well overall, no new complaints or concerns... she is not fasting today & will ret next week for FLabs...  ~  March 26, 2009:  6mo follow up- doing reasonably well, no new complaints or concerns, needs a few refills and Flu shot today... she's under considerable stress w/ husb's illness & need to go weekly to wound clinic (she has Alprazolam for Prn use)...  ~  April 01, 2010:  Yearly ROV- still under stress caring for husb, but she has been doing well w/o new complaints or concerns... notes occas HA's c/w muscle contraction & refief by ASA & Alpraz...  BP controlled on meds;  hx mild AS w/o dizzy, syncope, CP, palpit, ch in dyspnea;  Lipids improved w/ diet +meds;  Thyroid stable;  others OK as well- she wants Flu shot today & will ret for fasting blood work.  ~  July 09, 2011:  26mo ROV & she reports doing pretty well w/o new complaints or concerns; she is stressed to the max w/ husb's illness & his care needs; She had the 2012 Flu vaccine 10/12, we did Fasting blood work today & we will sched a follow up 2DEchocardiogram- See prob list below>>   ~08/16/2011 Acute Ov  Complains over last week with ear fullness and pressure. Decreased hearing on left. Feels dizzy at times with bending over. No fever or drainage. Has used  QTips at times.  No cough or dyspnea.           Problem List:  ALLERGIC RHINITIS (ICD-477.9) - she uses OTC Claritin as needed.  Hx of ASTHMATIC BRONCHITIS, ACUTE (ICD-466.0) - prev on Advair- using Prn & doing well & denies breathing problems- without AB  exac.  HYPERTENSION (ICD-401.9) - on LISINOPRIL 20mg /d and LASIX 40mg - taking 1/2 tab daily...  ~  10/11: BP= 120/60 and doing well... denies HA, fatigue, visual changes, CP, palipit, dizziness, syncope, dyspnea, edema, etc... ~  1/13: BP= 122/68 & she remains asymptomatic...  AORTIC VALVE DISEASE (ICD-424.1) - Cardiology eval 2008 by DrHochrein... known mild Ao Stenosis w/ 2DEcho 6/07 showing mild incr AoV thickness, mild reduced AoV leaflet excursions, AoV sclerosis w/o stenosis, norm LVF w/ EF= 65%. ~  NuclearStressTest 5/08 was neg- no ischemia, no infarct, EF= 80%... ~  2DEcho 10/09 showed norm LV sys function w/ EF= 55-60%, no regional wall motion abn, mild incr LVwall thickness, mild DD, mild calcif AoV, low-nl leaflet excursion- c/w mild AS. ~  note- CDopplers 10/09 were OK- mild plaque on right, 0-39% bilat... ~  1/13: she is in need of a f/u 2DEcho to check her AS- murmur sl louder now- denies CP, palpit,dizzy, syncope, SOB, edema, or cerebral ischemic symptoms...  VENOUS INSUFFICIENCY (ICD-459.81) - she follows a low sodium diet, elevates legs and uses support hose as needed.  HYPERLIPIDEMIA (ICD-272.4) - on SIMVASTATIN 40mg /d & FENOFIBRATE 160mg /d... + low fat diet... weight down 2# to 191#. ~  FLP 2/08 showed TChol 155, TG 227, HDL 35, LDL 100 ~  FLP 5/09 showed TChol 175, TG 170, HDL 30, LDL 111 ~  FLP 2/10 showed TChol 181, TG 198, HDL 35, LDL 106... rec> add FENOFIBRATE 160mg /d. ~  FLP 10/10 showed TChol 183, TG 109, HDL 40, LDL 121 ~  FLP 10/11 on Simva40+FENO160 showed TChol 177, TG 135, HDL 34, LDL 116 ~  FLP 7/12 on Simva40+FENO160 showed TChol 169, TG 115, HDL 41, LDL 105 ~  FLP 1/13 on Simva40+Feno160 showed TChol 183, TG 116, HDL 45, LDL 115  HYPOTHYROIDISM (ICD-244.9) - on LEVOTHYROID 62mcg/d... and clinically euthyroid... ~  last TSH 2/08 was 3.87 ~  labs 5/09 showed TSH= 3.55 ~  labs 10/10 showed TSH= 4.42 ~  labs 10/11 showed TSH= 5.00 ~  Labs 7/12 on Levo50  showed TSH= 3.89 ~  Labs 1/13 on Levo50 showed TSH= 5.86... Reminded to take it every day...  GERD (ICD-530.81) - on OMEPRAZOLE 20mg /d... she is asymptomatic... ~  EGD in 1989 by DrPatterson showed smHH, otherw neg... subseq 24H pH probe was normal...  COLONIC POLYPS (ICD-211.3) - tiny hyperplastic polyp removed via FlexSig 1994... no f/u since then, but she denies abd discomfort, d/c/bl etc...  DEGENERATIVE JOINT DISEASE (ICD-715.90) - eval by DrOlin 2006 w/ bilat knee osteoarthritis... she uses OTC meds for pain... ~  1/13: she notes some intermittent LBP & uses husb's Vicodin 1/2 tab prn...  FIBROMYALGIA (ICD-729.1) - not currently on meds...  ANXIETY (ICD-300.00) - uses ALPRAZOLAM 0.25mg  as needed... under stress w/ MrLinker going to the wound care clinic...  Hx of SHINGLES (ICD-053.9)  RASH - she sees DrLupton for Derm> she notes intertriginous rash & we will attempt Rx w/ LOTRISONE Cream Bid & she uses DESONIDE 0.05% for ears...   Past Surgical History  Procedure Date  . Vesicovaginal fistula closure w/ tah     Outpatient Encounter Prescriptions as of 08/16/2011  Medication Sig Dispense Refill  . ALPRAZolam (XANAX) 0.25 MG tablet Take 1 tablet (0.25 mg total) by mouth 3 (three) times daily as needed.  90 tablet  2  . clobetasol (TEMOVATE) 0.05 % external solution Use as directed for scale itch      . clotrimazole-betamethasone (LOTRISONE) cream Apply as directed      . desonide (DESOWEN) 0.05 % cream Apply 1 application topically 2 (two) times daily.      . ergocalciferol (VITAMIN D2) 50000 UNITS capsule Take 50,000 Units by mouth once a week.        . fenofibrate 160 MG tablet TAKE ONE TABLET BY MOUTH ONCE DAILY  30 tablet  6  . fluticasone (CUTIVATE) 0.05 % cream Apply topically 2 (two) times daily as needed.      . furosemide (LASIX) 40 MG tablet TAKE 1 TABLET EVERY DAY  30 tablet  5  . ketoconazole (NIZORAL) 2 % shampoo Apply as directed      . levothyroxine (SYNTHROID,  LEVOTHROID) 50 MCG tablet TAKE 1 TABLET EVERY DAY  30 tablet  5  . lisinopril (PRINIVIL,ZESTRIL) 20 MG tablet TAKE 1 TABLET EVERY DAY  30 tablet  4  . omeprazole (PRILOSEC) 20 MG capsule TAKE 1 CAPSULE BY MOUTH EVERY MORNING  30 capsule  4  . simvastatin (ZOCOR) 40 MG tablet TAKE 1 TABLET AT BEDTIME  30 tablet  5  . vitamin C (ASCORBIC ACID) 500 MG tablet Take 500 mg by mouth daily.      . Vitamin E 400 UNITS TABS Take 1 tablet by mouth daily.      . propoxyphene-acetaminophen (  DARVOCET-N 100) 100-650 MG per tablet Take 1 tablet by mouth every 6 (six) hours as needed.        Allergies  Allergen Reactions  . Sulfonamide Derivatives     REACTION: angioedema    Current Medications, Allergies, Past Medical History, Past Surgical History, Family History, and Social History were reviewed in [employer] record.    Review of Systems  Constitutional:   No  weight loss, night sweats,  Fevers, chills, + fatigue, or  lassitude.  HEENT:   No headaches,  Difficulty swallowing,  Tooth/dental problems, or  Sore throat,                No sneezing, itching,  +ear ache, - nasal congestion, post nasal drip,   CV:  No chest pain,  Orthopnea, PND, swelling in lower extremities, anasarca, dizziness, palpitations, syncope.   GI  No heartburn, indigestion, abdominal pain, nausea, vomiting, diarrhea, change in bowel habits, loss of appetite, bloody stools.   Resp:  No coughing up of blood.     No chest wall deformity  Skin: no rash or lesions.  GU: no dysuria, change in color of urine, no urgency or frequency.  No flank pain, no hematuria   MS:  No joint pain or swelling.  No decreased range of motion.     Psych:  No change in mood or affect. No depression or anxiety.              Objective:   Physical Exam      WD, Overweight, 78 y/o WF in NAD...  GENERAL:  Alert & oriented; pleasant & cooperative... HEENT:  [place]/AT,  TMs-wnl, NOSE-clear, THROAT-clear & wnl.,  bilateral cerumen impaction  NECK:  Supple w/ fairROM; no JVD; no thyromegaly or nodules palpated; no lymphadenopathy;   CHEST:  Clear to P & A; without wheezes/ rales/ or rhonchi. HEART:  Regular Rhythm;  gr1-2/6 SEM at base w/ rad to neck; without rubs or gallops heard... ABDOMEN:  Soft & nontender; + panniculus; normal bowel sounds; no organomegaly or masses detected. EXT: without deformities, mild arthritic changes; no varicose veins/ +venous insuffic/ tr. -1+ edema. NEURO:   no focal neuro deficits... DERM:  No rash      Assessment:            Plan:

## 2011-08-16 NOTE — Patient Instructions (Signed)
Use ear wax solution as needed.  Please contact office for sooner follow up if symptoms do not improve or worsen or seek emergency care

## 2011-08-17 ENCOUNTER — Other Ambulatory Visit: Payer: Self-pay | Admitting: Pulmonary Disease

## 2011-08-23 ENCOUNTER — Other Ambulatory Visit: Payer: Self-pay | Admitting: Pulmonary Disease

## 2011-10-05 ENCOUNTER — Telehealth: Payer: Self-pay | Admitting: Pulmonary Disease

## 2011-10-05 MED ORDER — ALPRAZOLAM 0.25 MG PO TABS: 0.2500 mg | ORAL_TABLET | Freq: Three times a day (TID) | ORAL | Status: DC | PRN

## 2011-10-05 NOTE — Telephone Encounter (Signed)
I checked with pharmacy and all is needed is a refill authorization. Refill sent. Pt is aware. Carron Curie, CMA

## 2011-12-27 ENCOUNTER — Other Ambulatory Visit: Payer: Self-pay | Admitting: Pulmonary Disease

## 2011-12-27 MED ORDER — ALPRAZOLAM 0.25 MG PO TABS: 0.2500 mg | ORAL_TABLET | Freq: Three times a day (TID) | ORAL | Status: DC | PRN

## 2012-01-11 ENCOUNTER — Encounter: Payer: Self-pay | Admitting: Pulmonary Disease

## 2012-01-11 ENCOUNTER — Ambulatory Visit (INDEPENDENT_AMBULATORY_CARE_PROVIDER_SITE_OTHER): Payer: Medicare Other | Admitting: Pulmonary Disease

## 2012-01-11 VITALS — BP 130/62 | HR 70 | Temp 96.6°F | Ht 65.0 in | Wt 177.6 lb

## 2012-01-11 DIAGNOSIS — IMO0001 Reserved for inherently not codable concepts without codable children: Secondary | ICD-10-CM

## 2012-01-11 DIAGNOSIS — I872 Venous insufficiency (chronic) (peripheral): Secondary | ICD-10-CM

## 2012-01-11 DIAGNOSIS — E785 Hyperlipidemia, unspecified: Secondary | ICD-10-CM

## 2012-01-11 DIAGNOSIS — I10 Essential (primary) hypertension: Secondary | ICD-10-CM

## 2012-01-11 DIAGNOSIS — I359 Nonrheumatic aortic valve disorder, unspecified: Secondary | ICD-10-CM

## 2012-01-11 DIAGNOSIS — E039 Hypothyroidism, unspecified: Secondary | ICD-10-CM

## 2012-01-11 DIAGNOSIS — K219 Gastro-esophageal reflux disease without esophagitis: Secondary | ICD-10-CM

## 2012-01-11 DIAGNOSIS — F411 Generalized anxiety disorder: Secondary | ICD-10-CM

## 2012-01-11 DIAGNOSIS — E559 Vitamin D deficiency, unspecified: Secondary | ICD-10-CM

## 2012-01-11 DIAGNOSIS — M1990 Unspecified osteoarthritis, unspecified site: Secondary | ICD-10-CM

## 2012-01-11 DIAGNOSIS — J209 Acute bronchitis, unspecified: Secondary | ICD-10-CM

## 2012-01-11 DIAGNOSIS — D126 Benign neoplasm of colon, unspecified: Secondary | ICD-10-CM

## 2012-01-11 NOTE — Patient Instructions (Addendum)
Today we updated your med list in our EPIC system...    Continue your current medications the same...  Please return to our lab one morning this week for your FASTING blood work...    We will call you w/ the results when avail...  Call for any questions...  Let's continue our 6 month follow up visits.Marland KitchenMarland Kitchen

## 2012-01-11 NOTE — Progress Notes (Addendum)
Subjective:     Patient ID: Deanna Olson, female   DOB: [birth year], 78 y.o.   MRN: [id number]  HPI 78 y/o WF here for a follow up visit... she has mult med problems as noted below...  ~  July 25, 2008:  she has had quiet a bit of trouble w/ her ears- 3 visits to [facility], ENT & finally improved- she reports Rx w/ ear drops and antibiotics... also saw DrGruber for Derm- Rx w/ cream for ears + shampoo... here for f/u AB, HBP, Valvular Hrt Dis, Chol, Thyroid, etc... doing well overall, no new complaints or concerns... she is not fasting today & will ret next week for FLabs...  ~  March 26, 2009:  70mo follow up- doing reasonably well, no new complaints or concerns, needs a few refills and Flu shot today... she's under considerable stress w/ husb's illness & need to go weekly to wound clinic (she has Alprazolam for Prn use)...  ~  April 01, 2010:  Yearly ROV- still under stress caring for husb, but she has been doing well w/o new complaints or concerns... notes occas HA's c/w muscle contraction & refief by ASA & Alpraz...  BP controlled on meds;  hx mild AS w/o dizzy, syncope, CP, palpit, ch in dyspnea;  Lipids improved w/ diet +meds;  Thyroid stable;  others OK as well- she wants Flu shot today & will ret for fasting blood work.  ~  July 09, 2011:  40mo ROV & she reports doing pretty well w/o new complaints or concerns; she is stressed to the max w/ husb's illness & his care needs; She had the 2012 Flu vaccine 10/12, we did Fasting blood work today & we will sched a follow up 2DEchocardiogram- See prob list below>>   ~  January 11, 2012:  1mo ROV & Deanna Olson is doing satis "just aging problems" and difficulty w/ husb 9early dementia & behav changes);  She notes some fatigue & notes that naps help, sleeping ok at night- apparently w/o snoring, restlessness, etc;  Also c/o toes cramping up & she notes OTC Mag is helping & wearing warm socks helps too...  We reviewed prob list, meds, xrays and  labs> see below for updates >> LABS 8/13:  FLP- at goals on simva40+Feno160;  Chems- ok w/ BUN=25 Creat=1.4 on Lasix;  CBC- ok;  TSH=3.63 on Levo50;  VitD=18 needs 50K weekly.          Problem List:  ALLERGIC RHINITIS (ICD-477.9) - she uses OTC Claritin as needed.  Hx of ASTHMATIC BRONCHITIS, ACUTE (ICD-466.0) - prev on Advair- using Prn & doing well & denies breathing problems- without AB exac. ~  CXR 4/08 showed normal heart size, clear lungs, NAD...  HYPERTENSION (ICD-401.9) - on LISINOPRIL 20mg /d and LASIX 40mg - taking 1/2 tab daily...  ~  10/11: BP= 120/60 and doing well... denies HA, fatigue, visual changes, CP, palipit, dizziness, syncope, dyspnea, edema, etc... ~  1/13: BP= 122/68 & she remains asymptomatic... ~  7/13:  BP= 130/62 and she denies CP, palpit, SOB, edema...   AORTIC VALVE DISEASE (ICD-424.1) - Cardiology eval 2008 by DrHochrein... known mild Ao Stenosis w/ 2DEcho 6/07 showing mild incr AoV thickness, mild reduced AoV leaflet excursions, AoV sclerosis w/o stenosis, norm LVF w/ EF= 65%. ~  NuclearStressTest 5/08 was neg- no ischemia, no infarct, EF= 80%... ~  2DEcho 10/09 showed norm LV sys function w/ EF= 55-60%, no regional wall motion abn, mild incr LVwall thickness, mild DD, mild calcif AoV,  low-nl leaflet excursion- c/w mild AS. ~  note- CDopplers 10/09 were OK- mild plaque on right, 0-39% bilat... ~  1/13: she is in need of a f/u 2DEcho to check her AS- murmur sl louder now- denies CP, palpit,dizzy, syncope, SOB, edema, or cerebral ischemic symptoms... ~  2DEcho 2/13 showed mod LVH, norm sys function w/ EF=55-60%, no regional wall motion abn, Gr1DD, mildAS, mildMR, PAsys=34...  VENOUS INSUFFICIENCY (ICD-459.81) - she follows a low sodium diet, elevates legs and uses support hose as needed.  HYPERLIPIDEMIA (ICD-272.4) - on SIMVASTATIN 40mg /d & FENOFIBRATE 160mg /d... + low fat diet... weight down to 178# ~  FLP 2/08 showed TChol 155, TG 227, HDL 35, LDL 100 ~  FLP  5/09 showed TChol 175, TG 170, HDL 30, LDL 111 ~  FLP 2/10 showed TChol 181, TG 198, HDL 35, LDL 106... rec> add FENOFIBRATE 160mg /d. ~  FLP 10/10 showed TChol 183, TG 109, HDL 40, LDL 121 ~  FLP 10/11 on Simva40+FENO160 showed TChol 177, TG 135, HDL 34, LDL 116 ~  FLP 7/12 on Simva40+FENO160 showed TChol 169, TG 115, HDL 41, LDL 105 ~  FLP 1/13 on Simva40+Feno160 showed TChol 183, TG 116, HDL 45, LDL 115 ~  FLP 8/13 on Simva40+Feno160 showed TChol 162, TG 105, HDL 45, LDL 96  HYPOTHYROIDISM (ICD-244.9) - on LEVOTHYROID 61mcg/d... and clinically euthyroid... ~  last TSH 2/08 was 3.87 ~  labs 5/09 showed TSH= 3.55 ~  labs 10/10 showed TSH= 4.42 ~  labs 10/11 showed TSH= 5.00 ~  Labs 7/12 on Levo50 showed TSH= 3.89 ~  Labs 1/13 on Levo50 showed TSH= 5.86... Reminded to take it every day... ~  Labs 8/13 on Levo50 showed TSH= 3.63  GERD (ICD-530.81) - on OMEPRAZOLE 20mg /d... she is asymptomatic... ~  EGD in 1989 by DrPatterson showed smHH, otherw neg... subseq 24H pH probe was normal...  COLONIC POLYPS (ICD-211.3) - tiny hyperplastic polyp removed via FlexSig 1994... no f/u since then, but she denies abd discomfort, d/c/bl etc...  MILD RENAL INSUFFIC >> ~  Labs 1/13 showed BUN= 27, Creat= 1.4 ~  Labs 8/13 showed BUN= 25, Creat= 1.4... Stable on Lasix40- taking 1/2 tab daily...  DEGENERATIVE JOINT DISEASE (ICD-715.90) - eval by DrOlin 2006 w/ bilat knee osteoarthritis... she uses OTC meds for pain... ~  1/13: she notes some intermittent LBP & uses husb's Vicodin 1/2 tab prn...  FIBROMYALGIA (ICD-729.1) - not currently on meds...  VITAMIN D DEFICIENCY >> ~  Labs 7/12 showed Vit D = 24 & rec to take Vit D 50K weekly... ~  Labs 8/13 showed Vit D level = 18... rec to take VitD 50K every week...  ANXIETY (ICD-300.00) - uses ALPRAZOLAM 0.25mg  as needed... under stress w/ MrLinker going to the wound care clinic & early signs of dementia... ~  7/13:  As noted she is under incr stress, we gave  husb some Seroquel to see if it helps his behavior, she takes alpraz Bid too...  Hx of SHINGLES (ICD-053.9)  RASH - she sees DrLupton for Derm> she notes intertriginous rash & we will attempt Rx w/ LOTRISONE Cream Bid & she uses DESONIDE 0.05% for ears...   Past Surgical History  Procedure Date  . Vesicovaginal fistula closure w/ tah     Outpatient Encounter Prescriptions as of 01/11/2012  Medication Sig Dispense Refill  . ALPRAZolam (XANAX) 0.25 MG tablet Take 1 tablet (0.25 mg total) by mouth 3 (three) times daily as needed for anxiety.  90 tablet  5  .  clobetasol (TEMOVATE) 0.05 % external solution Use as directed for scale itch      . clotrimazole-betamethasone (LOTRISONE) cream Apply as directed      . desonide (DESOWEN) 0.05 % cream Apply 1 application topically 2 (two) times daily.      . fenofibrate 160 MG tablet TAKE ONE TABLET BY MOUTH ONCE DAILY  30 tablet  6  . fluticasone (CUTIVATE) 0.05 % cream Apply topically 2 (two) times daily as needed.      . furosemide (LASIX) 40 MG tablet TAKE 1 TABLET EVERY DAY  30 tablet  5  . ketoconazole (NIZORAL) 2 % shampoo Apply as directed      . levothyroxine (SYNTHROID, LEVOTHROID) 50 MCG tablet TAKE 1 TABLET EVERY DAY  30 tablet  5  . lisinopril (PRINIVIL,ZESTRIL) 20 MG tablet TAKE 1 TABLET EVERY DAY  30 tablet  4  . omeprazole (PRILOSEC) 20 MG capsule TAKE 1 CAPSULE BY MOUTH EVERY MORNING  30 capsule  4  . simvastatin (ZOCOR) 40 MG tablet TAKE 1 TABLET AT BEDTIME  30 tablet  5  . vitamin C (ASCORBIC ACID) 500 MG tablet Take 500 mg by mouth daily.      . Vitamin D, Ergocalciferol, (DRISDOL) 50000 UNITS CAPS TAKE ONE CAPSULE BY MOUTH ONCE WEEKLY  4 capsule  4  . Vitamin E 400 UNITS TABS Take 1 tablet by mouth daily.      Marland Kitchen DISCONTD: propoxyphene-acetaminophen (DARVOCET-N 100) 100-650 MG per tablet Take 1 tablet by mouth every 6 (six) hours as needed.        Allergies  Allergen Reactions  . Sulfonamide Derivatives     REACTION:  angioedema    Current Medications, Allergies, Past Medical History, Past Surgical History, Family History, and Social History were reviewed in [employer] record.    Review of Systems       See HPI - all other systems neg except as noted...  The patient denies anorexia, fever, weight loss, weight gain, vision loss, decreased hearing, hoarseness, chest pain, syncope, dyspnea on exertion, peripheral edema, prolonged cough, headaches, hemoptysis, abdominal pain, melena, hematochezia, severe indigestion/heartburn, hematuria, incontinence, muscle weakness, suspicious skin lesions, transient blindness, difficulty walking, depression, unusual weight change, abnormal bleeding, enlarged lymph nodes, and angioedema.     Objective:   Physical Exam     WD, Overweight, 78 y/o WF in NAD...  GENERAL:  Alert & oriented; pleasant & cooperative... HEENT:  [place]/AT, EOM-wnl, PERRLA, EACs-clear, TMs-wnl, NOSE-clear, THROAT-clear & wnl. NECK:  Supple w/ fairROM; no JVD; no thyromegaly or nodules palpated; no lymphadenopathy;  transmit murmur in base of neck to carotids... CHEST:  Clear to P & A; without wheezes/ rales/ or rhonchi. HEART:  Regular Rhythm;  gr1-2/6 SEM at base w/ rad to neck; without rubs or gallops heard... ABDOMEN:  Soft & nontender; + panniculus; normal bowel sounds; no organomegaly or masses detected. EXT: without deformities, mild arthritic changes; no varicose veins/ +venous insuffic/ tr. -1+ edema. NEURO:  CN's intact;  no focal neuro deficits... DERM:  Intertrig rash on groin/ abd...  RADIOLOGY DATA:  Reviewed in the EPIC EMR & discussed w/ the patient...    >>Last CXR 4/08 showed normal heart size, clear lungs, NAD...  LABORATORY DATA:  Reviewed in the EPIC EMR & discussed w/ the patient...   Assessment:     Hx AB & AR>  No problems, no recent resp exac & doing well overall w/o meds required...  HBP>  Controlled on Lisinopril & Lasix;  she remains  asymptomatic...  Ao Valve dis>  She has mild AS & f/u 2DEcho reviewed...  Ven Insuffic>  She knows to elim sodium, elevate legs, wear support hose...  Hyperlipidemia>  On Simva40 + Fenofib160;  Labs reviewed, needs better diet, exercise, get wt down...  Hypothyroid>  On Levothyroid 48mcg/d & clinically euthyroid; reminded to take med every day...  GERD>  On Omep & doing well...  DJD/ LBP>  She has mild ortho complaints; uses 1/2 vicodin for relief as needed...  Vit D Defic>  Vit d level is still low; aske to take the VitD 50K every week...  Anxiety>  On Alprazolam as needed...   Derm>  Rash treated by DrLupton...     Plan:     Patient's Medications  New Prescriptions   No medications on file  Previous Medications   ALPRAZOLAM (XANAX) 0.25 MG TABLET    Take 1 tablet (0.25 mg total) by mouth 3 (three) times daily as needed for anxiety.   CLOBETASOL (TEMOVATE) 0.05 % EXTERNAL SOLUTION    Use as directed for scale itch   CLOTRIMAZOLE-BETAMETHASONE (LOTRISONE) CREAM    Apply as directed   DESONIDE (DESOWEN) 0.05 % CREAM    Apply 1 application topically 2 (two) times daily.   FENOFIBRATE 160 MG TABLET    TAKE ONE TABLET BY MOUTH ONCE DAILY   FLUTICASONE (CUTIVATE) 0.05 % CREAM    Apply topically 2 (two) times daily as needed.   KETOCONAZOLE (NIZORAL) 2 % SHAMPOO    Apply as directed   LEVOTHYROXINE (SYNTHROID, LEVOTHROID) 50 MCG TABLET    TAKE 1 TABLET EVERY DAY   LISINOPRIL (PRINIVIL,ZESTRIL) 20 MG TABLET    TAKE 1 TABLET EVERY DAY   OMEPRAZOLE (PRILOSEC) 20 MG CAPSULE    TAKE 1 CAPSULE BY MOUTH EVERY MORNING   SIMVASTATIN (ZOCOR) 40 MG TABLET    TAKE 1 TABLET AT BEDTIME   VITAMIN C (ASCORBIC ACID) 500 MG TABLET    Take 500 mg by mouth daily.   VITAMIN D, ERGOCALCIFEROL, (DRISDOL) 50000 UNITS CAPS    TAKE ONE CAPSULE BY MOUTH ONCE WEEKLY   VITAMIN E 400 UNITS TABS    Take 1 tablet by mouth daily.  Modified Medications   Modified Medication Previous Medication   FUROSEMIDE  (LASIX) 40 MG TABLET furosemide (LASIX) 40 MG tablet          TAKE 1 TABLET EVERY DAY  Discontinued Medications   PROPOXYPHENE-ACETAMINOPHEN (DARVOCET-N 100) 100-650 MG PER TABLET    Take 1 tablet by mouth every 6 (six) hours as needed.

## 2012-01-14 ENCOUNTER — Other Ambulatory Visit (INDEPENDENT_AMBULATORY_CARE_PROVIDER_SITE_OTHER): Payer: Medicare Other

## 2012-01-14 DIAGNOSIS — E785 Hyperlipidemia, unspecified: Secondary | ICD-10-CM

## 2012-01-14 DIAGNOSIS — D126 Benign neoplasm of colon, unspecified: Secondary | ICD-10-CM

## 2012-01-14 DIAGNOSIS — E559 Vitamin D deficiency, unspecified: Secondary | ICD-10-CM

## 2012-01-14 DIAGNOSIS — F411 Generalized anxiety disorder: Secondary | ICD-10-CM

## 2012-01-14 DIAGNOSIS — I10 Essential (primary) hypertension: Secondary | ICD-10-CM

## 2012-01-14 LAB — CBC WITH DIFFERENTIAL/PLATELET
Eosinophils Absolute: 0.3 10*3/uL (ref 0.0–0.7)
Eosinophils Relative: 5 % (ref 0.0–5.0)
MCHC: 33 g/dL (ref 30.0–36.0)
MCV: 94.7 fl (ref 78.0–100.0)
Monocytes Absolute: 0.5 10*3/uL (ref 0.1–1.0)
Neutrophils Relative %: 37.6 % — ABNORMAL LOW (ref 43.0–77.0)
Platelets: 170 10*3/uL (ref 150.0–400.0)
WBC: 5.1 10*3/uL (ref 4.5–10.5)

## 2012-01-14 LAB — HEPATIC FUNCTION PANEL
ALT: 18 U/L (ref 0–35)
AST: 30 U/L (ref 0–37)
Albumin: 4.1 g/dL (ref 3.5–5.2)
Alkaline Phosphatase: 33 U/L — ABNORMAL LOW (ref 39–117)
Total Protein: 7.3 g/dL (ref 6.0–8.3)

## 2012-01-14 LAB — BASIC METABOLIC PANEL
BUN: 25 mg/dL — ABNORMAL HIGH (ref 6–23)
CO2: 25 mEq/L (ref 19–32)
Chloride: 109 mEq/L (ref 96–112)
GFR: 38.08 mL/min — ABNORMAL LOW (ref >60.00)
Glucose, Bld: 91 mg/dL (ref 70–99)
Potassium: 4.4 mEq/L (ref 3.5–5.1)
Sodium: 143 mEq/L (ref 135–145)

## 2012-01-14 LAB — LIPID PANEL
Cholesterol: 162 mg/dL (ref 0–200)
VLDL: 21 mg/dL (ref 0.0–40.0)

## 2012-01-14 LAB — TSH: TSH: 3.63 u[IU]/mL (ref 0.35–5.50)

## 2012-01-15 LAB — VITAMIN D 25 HYDROXY (VIT D DEFICIENCY, FRACTURES): Vit D, 25-Hydroxy: 18 ng/mL — ABNORMAL LOW (ref 30–89)

## 2012-01-18 ENCOUNTER — Other Ambulatory Visit: Payer: Self-pay | Admitting: Pulmonary Disease

## 2012-01-18 MED ORDER — VITAMIN D (ERGOCALCIFEROL) 1.25 MG (50000 UNIT) PO CAPS: 50000.0000 [IU] | ORAL_CAPSULE | ORAL | Status: DC

## 2012-03-18 ENCOUNTER — Other Ambulatory Visit: Payer: Self-pay | Admitting: Pulmonary Disease

## 2012-04-05 ENCOUNTER — Other Ambulatory Visit: Payer: Self-pay | Admitting: Pulmonary Disease

## 2012-04-06 ENCOUNTER — Ambulatory Visit (INDEPENDENT_AMBULATORY_CARE_PROVIDER_SITE_OTHER): Payer: Medicare Other

## 2012-04-06 DIAGNOSIS — Z23 Encounter for immunization: Secondary | ICD-10-CM

## 2012-04-19 ENCOUNTER — Telehealth: Payer: Self-pay | Admitting: Pulmonary Disease

## 2012-04-19 NOTE — Telephone Encounter (Signed)
Called, spoke with pt who c/o "head cold" since Monday.  Reports she has a sore throat, PND, a little sinus pressure, her ears "feel funny," has nasal congestion, a little increased DOE, is coughing some but not much with clear mucus, and not sleeping well qhs.  Denies chest tightness, chest pain, or f/c/s.  Taking alka-seltzer cold tablets, claritin, and doing warm salt water gargles.  Requesting any further recs Dr. Kriste Basque may have.  Pls advise.  Thank you.   Last OV with Dr. Kriste Basque - 01/11/12 Pending OV with Dr. Kriste Basque 07/13/12  [facility] [address]  Allergies verified:  Allergies  Allergen Reactions  . Sulfonamide Derivatives     REACTION: angioedema

## 2012-04-19 NOTE — Telephone Encounter (Signed)
Per SN---  1.  Lozenges, chloroseptic spray for the throat 2.  mucinex 2 po bid with plenty of fluids 3.  Nasal saline spray every 1-2 hours while awake 4.  Delsym 2 tsp po bid as needed for cough  All these are OTC medications that the pt can use. thanks

## 2012-04-19 NOTE — Telephone Encounter (Signed)
Called spoke with patient, advised of SN's recs as stated below.  Pt verbalized her understanding and denied any questions - she will call if her symptoms do not improve or worsen or she develops discolored mucus.

## 2012-05-17 ENCOUNTER — Telehealth: Payer: Self-pay | Admitting: Pulmonary Disease

## 2012-05-17 NOTE — Telephone Encounter (Signed)
[name]- nurse- called back to add the following: pt also states that she has "SOB periodically" but it resolves with rest. Deanna Olson

## 2012-05-18 NOTE — Telephone Encounter (Signed)
Pt has pending appt with SN on 06/2012.  SN any further recs for the pt at this time?  This was an Financial planner message from the home health nurse.  Thanks   Allergies  Allergen Reactions  . Sulfonamide Derivatives     REACTION: angioedema

## 2012-05-23 NOTE — Telephone Encounter (Signed)
Done

## 2012-05-23 NOTE — Telephone Encounter (Signed)
Per SN-- call debbie  And thank her for the info----pt has mild AS and this has been known since 2008.   Last 2d echo 07/2011 with mild AS.  thanks

## 2012-06-05 ENCOUNTER — Telehealth: Payer: Self-pay | Admitting: Pulmonary Disease

## 2012-06-05 ENCOUNTER — Other Ambulatory Visit: Payer: Self-pay | Admitting: Pulmonary Disease

## 2012-06-05 NOTE — Telephone Encounter (Signed)
RX's have sent earlier by [facility]. Nothing further was needed

## 2012-06-09 ENCOUNTER — Other Ambulatory Visit: Payer: Self-pay | Admitting: Adult Health

## 2012-06-13 NOTE — Telephone Encounter (Signed)
Spoke with patient, patient states she already has cream. Nothing further needed at this time

## 2012-07-07 ENCOUNTER — Other Ambulatory Visit: Payer: Self-pay | Admitting: Pulmonary Disease

## 2012-07-13 ENCOUNTER — Ambulatory Visit (INDEPENDENT_AMBULATORY_CARE_PROVIDER_SITE_OTHER)
Admission: RE | Admit: 2012-07-13 | Discharge: 2012-07-13 | Disposition: A | Discharge status: Home / Self Care | Payer: Medicare Other | Source: Ambulatory Visit | Attending: Pulmonary Disease | Admitting: Pulmonary Disease

## 2012-07-13 ENCOUNTER — Encounter: Payer: Self-pay | Admitting: Pulmonary Disease

## 2012-07-13 ENCOUNTER — Ambulatory Visit (INDEPENDENT_AMBULATORY_CARE_PROVIDER_SITE_OTHER): Payer: Medicare Other | Admitting: Pulmonary Disease

## 2012-07-13 VITALS — BP 120/72 | HR 73 | Temp 98.1°F | Ht 65.0 in | Wt 175.4 lb

## 2012-07-13 DIAGNOSIS — K219 Gastro-esophageal reflux disease without esophagitis: Secondary | ICD-10-CM

## 2012-07-13 DIAGNOSIS — E559 Vitamin D deficiency, unspecified: Secondary | ICD-10-CM

## 2012-07-13 DIAGNOSIS — M1990 Unspecified osteoarthritis, unspecified site: Secondary | ICD-10-CM

## 2012-07-13 DIAGNOSIS — E785 Hyperlipidemia, unspecified: Secondary | ICD-10-CM

## 2012-07-13 DIAGNOSIS — D126 Benign neoplasm of colon, unspecified: Secondary | ICD-10-CM

## 2012-07-13 DIAGNOSIS — I359 Nonrheumatic aortic valve disorder, unspecified: Secondary | ICD-10-CM

## 2012-07-13 DIAGNOSIS — I10 Essential (primary) hypertension: Secondary | ICD-10-CM

## 2012-07-13 DIAGNOSIS — I872 Venous insufficiency (chronic) (peripheral): Secondary | ICD-10-CM

## 2012-07-13 DIAGNOSIS — L259 Unspecified contact dermatitis, unspecified cause: Secondary | ICD-10-CM

## 2012-07-13 DIAGNOSIS — IMO0001 Reserved for inherently not codable concepts without codable children: Secondary | ICD-10-CM

## 2012-07-13 DIAGNOSIS — F411 Generalized anxiety disorder: Secondary | ICD-10-CM

## 2012-07-13 DIAGNOSIS — E039 Hypothyroidism, unspecified: Secondary | ICD-10-CM

## 2012-07-13 NOTE — Patient Instructions (Addendum)
Today we updated your med list in our EPIC system...    Continue your current medications the same...  Today we did your follow up CXR & EKG... Please return to our lab one morning soon for your FASTING blood work...    We will call you w/ the results when avail...  Call for any problems...  Let's continue our 6 month follow up visits.Marland KitchenMarland Kitchen

## 2012-07-13 NOTE — Progress Notes (Signed)
Subjective:     Patient ID: Deanna Olson, female   DOB: January 02, 1926, 79 y.o.   MRN: [id number]  HPI 79 y/o WF here for a follow up visit... she has mult med problems as noted below...  ~  April 01, 2010:  Yearly ROV- still under stress caring for husb, but she has been doing well w/o new complaints or concerns... notes occas HA's c/w muscle contraction & refief by ASA & Alpraz...  BP controlled on meds;  hx mild AS w/o dizzy, syncope, CP, palpit, ch in dyspnea;  Lipids improved w/ diet +meds;  Thyroid stable;  others OK as well- she wants Flu shot today & will ret for fasting blood work.  ~  July 09, 2011:  58mo ROV & she reports doing pretty well w/o new complaints or concerns; she is stressed to the max w/ husb's illness & his care needs; She had the 2012 Flu vaccine 10/12, we did Fasting blood work today & we will sched a follow up 2DEchocardiogram- See prob list below>>   ~  January 11, 2012:  44mo ROV & Deanna Olson is doing satis "just aging problems" and difficulty w/ husb (early dementia & behav changes);  She notes some fatigue & notes that naps help, sleeping ok at night- apparently w/o snoring, restlessness, etc;  Also c/o toes cramping up & she notes OTC Mag is helping & wearing warm socks helps too...  We reviewed prob list, meds, xrays and labs> see below for updates >> LABS 8/13:  FLP- at goals on simva40+Feno160;  Chems- ok w/ BUN=25 Creat=1.4 on Lasix;  CBC- ok;  TSH=3.63 on Levo50;  VitD=18 needs 50K weekly.  ~  July 13, 2012:  44mo ROV & Deanna Olson continues stable- no new complaints or concerns- "pretty good for 11" she says; her daugh notes incr anxiety & she has Alpraz 0.5mg  for prn use...    HBP, mildAS> on Lisin20, Lasix40-1/2; BP= 120/72 & she denies CP, palpit, SOB, syncope, edema; she has seen DrHochrein in the past- but no old notes or EKGs in Epic or [facility] (must have been prior to 2008- in paper chart); mildAS by 2D in 2008 & no change 2/13 w/ no regional wall motion abn  & modLVH seen; she had neg Myoview in 2008; she has NOT been exercising- we will arrange for Cards f/u appt...    Hyperlipid> on Simva40 + Feno160; FLP shows TChol 180, TG 106, HDL 38, LDL 121- not as good as prev & needs better diet...    Hypothy> on Levothyroid50; Labs showed TSH= 4.57 7 she is both clinically & biochem euthyroid...    Renal Insuffic> labs showed BUN= 28, Creat= 1.5; she denies voiding issues, etc; we will continue to monitor...    DJD, FM, VitD defic> she uses OTC analgesics as needed & takes VitD 50K weekly; her CC= knees and back & she is encouraged to incr exercise; last VitD level was 8/13 = 18 and 50,000 u weekly started at that time... We reviewed prob list, meds, xrays and labs> see below for updates >> she had the 2013 Flu vaccine 10/13 CXR 1/14 showed borderline hrt size & tort Ao, clear lungs w/ mild hyperinflation, NAD.Marland KitchenMarland Kitchen EKG 1/14 showed SBrady, rate58, LAD, wide qrs, poor r progression- r/o ant scar... LABS 1/14:  FLP- ok but LDL=121 on Simva40 + Feno160;  Chems- ok x BS=110 BUN=28 Creat=1.5;  TSH=4.57           Problem List:  ALLERGIC RHINITIS (ICD-477.9) -  she uses OTC Claritin as needed... She has seen DrCrossley for ENT.  Hx of ASTHMATIC BRONCHITIS, ACUTE (ICD-466.0) - prev on Advair- using Prn & doing well & denies breathing problems- without AB exac. ~  CXR 4/08 showed normal heart size, clear lungs, NAD...  HYPERTENSION (ICD-401.9) - on LISINOPRIL 20mg /d and LASIX 40mg - taking 1/2 tab daily...  ~  10/11: BP= 120/60 and doing well... denies HA, fatigue, visual changes, CP, palipit, dizziness, syncope, dyspnea, edema, etc... ~  1/13: BP= 122/68 & she remains asymptomatic... ~  7/13:  BP= 130/62 and she denies CP, palpit, SOB, edema...  ~  1/14: on Lisin20, Lasix40-1/2; BP= 120/72 & she denies CP, palpit, SOB, syncope, edema.  AORTIC VALVE DISEASE (ICD-424.1) - Cardiology eval 2008 by DrHochrein... known mild Ao Stenosis w/ 2DEcho 6/07 showing mild incr  AoV thickness, mild reduced AoV leaflet excursions, AoV sclerosis w/o stenosis, norm LVF w/ EF= 65%. ~  NuclearStressTest 5/08 was neg- no ischemia, no infarct, EF= 80%... ~  2DEcho 10/09 showed norm LV sys function w/ EF= 55-60%, no regional wall motion abn, mild incr LVwall thickness, mild DD, mild calcif AoV, low-nl leaflet excursion- c/w mild AS. ~  note- CDopplers 10/09 were OK- mild plaque on right, 0-39% bilat... ~  1/13: she is in need of a f/u 2DEcho to check her AS- murmur sl louder now- denies CP, palpit,dizzy, syncope, SOB, edema, or cerebral ischemic symptoms... ~  2DEcho 2/13 showed mod LVH, norm sys function w/ EF=55-60%, no regional wall motion abn, Gr1DD, mildAS, mildMR, PAsys=34... ~  1/14: mildAS by 2D in 2008 & no change 2/13 w/ no regional wall motion abn & modLVH seen; she had neg Myoview in 2008; she has NOT been exercising- we will arrange for Cards f/u appt...   VENOUS INSUFFICIENCY (ICD-459.81) - she follows a low sodium diet, elevates legs and uses support hose as needed.  HYPERLIPIDEMIA (ICD-272.4) - on SIMVASTATIN 40mg /d & FENOFIBRATE 160mg /d + low fat diet. ~  FLP 2/08 showed TChol 155, TG 227, HDL 35, LDL 100 ~  FLP 5/09 showed TChol 175, TG 170, HDL 30, LDL 111 ~  FLP 2/10 showed TChol 181, TG 198, HDL 35, LDL 106... rec> add FENOFIBRATE 160mg /d. ~  FLP 10/10 showed TChol 183, TG 109, HDL 40, LDL 121 ~  FLP 10/11 on Simva40+FENO160 showed TChol 177, TG 135, HDL 34, LDL 116 ~  FLP 7/12 on Simva40+FENO160 showed TChol 169, TG 115, HDL 41, LDL 105 ~  FLP 1/13 on Simva40+Feno160 showed TChol 183, TG 116, HDL 45, LDL 115 ~  FLP 8/13 on Simva40+Feno160 showed TChol 162, TG 105, HDL 45, LDL 96 ~  FLP 1/14 on Simva40+Feno160 showed TChol 180, TG 106, HDL 38, LDL 121- not as good as prev & needs better diet.Marland Kitchen  HYPOTHYROIDISM (ICD-244.9) - on LEVOTHYROID 54mcg/d... and clinically euthyroid... ~  last TSH 2/08 was 3.87 ~  labs 5/09 showed TSH= 3.55 ~  labs 10/10 showed  TSH= 4.42 ~  labs 10/11 showed TSH= 5.00 ~  Labs 7/12 on Levo50 showed TSH= 3.89 ~  Labs 1/13 on Levo50 showed TSH= 5.86... Reminded to take it every day... ~  Labs 8/13 on Levo50 showed TSH= 3.63 ~  Labs 1/14 on Levo50 showed TSH= 4.57  GERD (ICD-530.81) - on OMEPRAZOLE 20mg /d... she is asymptomatic... ~  EGD in 1989 by DrPatterson showed smHH, otherw neg... subseq 24H pH probe was normal...  COLONIC POLYPS (ICD-211.3) - tiny hyperplastic polyp removed via FlexSig 1994... no f/u  since then, but she denies abd discomfort, d/c/bl etc...  MILD RENAL INSUFFIC >> ~  Labs 1/13 showed BUN= 27, Creat= 1.4 ~  Labs 8/13 showed BUN= 25, Creat= 1.4... Stable on Lasix40- taking 1/2 tab daily... ~  Labs 1/14 showed BUN= 28, Creat= 1.5 and she continues on Lisin20 & Lasix20.  DEGENERATIVE JOINT DISEASE (ICD-715.90) - eval by DrOlin 2006 w/ bilat knee osteoarthritis... she uses OTC meds for pain... ~  1/13: she notes some intermittent LBP & uses husb's Vicodin 1/2 tab prn...  FIBROMYALGIA (ICD-729.1) - not currently on meds...  VITAMIN D DEFICIENCY >> ~  Labs 7/12 showed Vit D = 24 & rec to take Vit D 50K weekly... ~  Labs 8/13 showed Vit D level = 18... rec to take VitD 50K every week...  ANXIETY (ICD-300.00) - uses ALPRAZOLAM 0.25mg  as needed... under stress w/ MrLinker going to the wound care clinic & early signs of dementia... ~  7/13:  As noted she is under incr stress, we gave husb some Seroquel to see if it helps his behavior, she takes alpraz Bid too...  Hx of SHINGLES (ICD-053.9)  RASH - she sees DrLupton for Derm> she notes intertriginous rash & we will attempt Rx w/ LOTRISONE Cream Bid & she uses DESONIDE 0.05% for ears...   Past Surgical History  Procedure Date  . Vesicovaginal fistula closure w/ tah     Outpatient Encounter Prescriptions as of 07/13/2012  Medication Sig Dispense Refill  . ALPRAZolam (XANAX) 0.25 MG tablet Take 1 tablet (0.25 mg total) by mouth 3 (three) times  daily as needed for anxiety.  90 tablet  5  . clobetasol (TEMOVATE) 0.05 % external solution Use as directed for scale itch      . desonide (DESOWEN) 0.05 % cream Apply 1 application topically 2 (two) times daily.      . fenofibrate 160 MG tablet TAKE 1 TABLET EVERY DAY  90 tablet  1  . furosemide (LASIX) 40 MG tablet Take 1/2 tablet daily      . ketoconazole (NIZORAL) 2 % shampoo Apply as directed      . levothyroxine (SYNTHROID, LEVOTHROID) 50 MCG tablet TAKE 1 TABLET EVERY DAY  90 tablet  1  . lisinopril (PRINIVIL,ZESTRIL) 20 MG tablet TAKE 1 TABLET EVERY DAY  90 tablet  1  . omeprazole (PRILOSEC) 20 MG capsule TAKE ONE CAPSULE BY MOUTH EVERY MORNING  90 capsule  1  . simvastatin (ZOCOR) 40 MG tablet TAKE 1 TABLET AT BEDTIME  90 tablet  1  . Vitamin D, Ergocalciferol, (DRISDOL) 50000 UNITS CAPS Take 1 capsule (50,000 Units total) by mouth every 7 (seven) days.  12 capsule  3  . Vitamin E 400 UNITS TABS Take 1 tablet by mouth daily.      . vitamin C (ASCORBIC ACID) 500 MG tablet Take 500 mg by mouth daily.      . [DISCONTINUED] clotrimazole-betamethasone (LOTRISONE) cream Apply as directed      . [DISCONTINUED] clotrimazole-betamethasone (LOTRISONE) cream APPLY TO AFFECTED AREA TWICE A DAY  30 g  1  . [DISCONTINUED] fluticasone (CUTIVATE) 0.05 % cream Apply topically 2 (two) times daily as needed.        Allergies  Allergen Reactions  . Sulfonamide Derivatives     REACTION: angioedema    Current Medications, Allergies, Past Medical History, Past Surgical History, Family History, and Social History were reviewed in [employer] record.    Review of Systems  See HPI - all other systems neg except as noted...  The patient denies anorexia, fever, weight loss, weight gain, vision loss, decreased hearing, hoarseness, chest pain, syncope, dyspnea on exertion, peripheral edema, prolonged cough, headaches, hemoptysis, abdominal pain, melena, hematochezia, severe  indigestion/heartburn, hematuria, incontinence, muscle weakness, suspicious skin lesions, transient blindness, difficulty walking, depression, unusual weight change, abnormal bleeding, enlarged lymph nodes, and angioedema.     Objective:   Physical Exam     WD, Overweight, 79 y/o WF in NAD...  GENERAL:  Alert & oriented; pleasant & cooperative... HEENT:  /AT, EOM-wnl, PERRLA, EACs-clear, TMs-wnl, NOSE-clear, THROAT-clear & wnl. NECK:  Supple w/ fairROM; no JVD; no thyromegaly or nodules palpated; no lymphadenopathy;  transmit murmur in base of neck to carotids... CHEST:  Clear to P & A; without wheezes/ rales/ or rhonchi. HEART:  Regular Rhythm;  gr1-2/6 SEM at base w/ rad to neck; without rubs or gallops heard... ABDOMEN:  Soft & nontender; + panniculus; normal bowel sounds; no organomegaly or masses detected. EXT: without deformities, mild arthritic changes; no varicose veins/ +venous insuffic/ tr. -1+ edema. NEURO:  CN's intact;  no focal neuro deficits... DERM:  Intertrig rash on groin/ abd...  RADIOLOGY DATA:  Reviewed in the EPIC EMR & discussed w/ the patient...   LABORATORY DATA:  Reviewed in the EPIC EMR & discussed w/ the patient...   Assessment:      Hx AB & AR>  No problems, no recent resp exac & doing well overall w/o meds required...  HBP>  Controlled on Lisinopril & Lasix; she remains asymptomatic...  Ao Valve dis>  She has mild AS & an abn EKG- needs cards f/u DrHochrein...  Ven Insuffic>  She knows to elim sodium, elevate legs, wear support hose...  Hyperlipidemia>  On Simva40 + Fenofib160;  Labs reviewed, needs better diet, exercise, get wt down...  Hypothyroid>  On Levothyroid 52mcg/d & clinically euthyroid; reminded to take med every day...  GERD>  On Omep & doing well...  DJD/ LBP>  She has mild ortho complaints; uses 1/2 vicodin for relief as needed...  Vit D Defic>  Vit d level was low; asked to take the VitD 50K every week...  Anxiety>  On  Alprazolam as needed...   Derm>  Rash treated by DrLupton...     Plan:     Patient's Medications  New Prescriptions   No medications on file  Previous Medications   ALPRAZOLAM (XANAX) 0.25 MG TABLET    Take 1 tablet (0.25 mg total) by mouth 3 (three) times daily as needed for anxiety.   CLOBETASOL (TEMOVATE) 0.05 % EXTERNAL SOLUTION    Use as directed for scale itch   DESONIDE (DESOWEN) 0.05 % CREAM    Apply 1 application topically 2 (two) times daily.   FENOFIBRATE 160 MG TABLET    TAKE 1 TABLET EVERY DAY   FUROSEMIDE (LASIX) 40 MG TABLET    Take 1/2 tablet daily   KETOCONAZOLE (NIZORAL) 2 % SHAMPOO    Apply as directed   LEVOTHYROXINE (SYNTHROID, LEVOTHROID) 50 MCG TABLET    TAKE 1 TABLET EVERY DAY   LISINOPRIL (PRINIVIL,ZESTRIL) 20 MG TABLET    TAKE 1 TABLET EVERY DAY   OMEPRAZOLE (PRILOSEC) 20 MG CAPSULE    TAKE ONE CAPSULE BY MOUTH EVERY MORNING   SIMVASTATIN (ZOCOR) 40 MG TABLET    TAKE 1 TABLET AT BEDTIME   VITAMIN C (ASCORBIC ACID) 500 MG TABLET    Take 500 mg by mouth daily.   VITAMIN  D, ERGOCALCIFEROL, (DRISDOL) 50000 UNITS CAPS    Take 1 capsule (50,000 Units total) by mouth every 7 (seven) days.   VITAMIN E 400 UNITS TABS    Take 1 tablet by mouth daily.  Modified Medications   No medications on file  Discontinued Medications   CLOTRIMAZOLE-BETAMETHASONE (LOTRISONE) CREAM    Apply as directed   CLOTRIMAZOLE-BETAMETHASONE (LOTRISONE) CREAM    APPLY TO AFFECTED AREA TWICE A DAY   FLUTICASONE (CUTIVATE) 0.05 % CREAM    Apply topically 2 (two) times daily as needed.

## 2012-07-17 ENCOUNTER — Other Ambulatory Visit (INDEPENDENT_AMBULATORY_CARE_PROVIDER_SITE_OTHER): Payer: Medicare Other

## 2012-07-17 DIAGNOSIS — F411 Generalized anxiety disorder: Secondary | ICD-10-CM

## 2012-07-17 DIAGNOSIS — I10 Essential (primary) hypertension: Secondary | ICD-10-CM

## 2012-07-17 DIAGNOSIS — E785 Hyperlipidemia, unspecified: Secondary | ICD-10-CM

## 2012-07-17 LAB — LIPID PANEL
LDL Cholesterol: 121 mg/dL — ABNORMAL HIGH (ref 0–99)
VLDL: 21.2 mg/dL (ref 0.0–40.0)

## 2012-07-17 LAB — BASIC METABOLIC PANEL
GFR: 36.24 mL/min — ABNORMAL LOW (ref >60.00)
Potassium: 4.5 mEq/L (ref 3.5–5.1)
Sodium: 142 mEq/L (ref 135–145)

## 2012-07-17 LAB — TSH: TSH: 4.57 u[IU]/mL (ref 0.35–5.50)

## 2012-07-20 ENCOUNTER — Other Ambulatory Visit: Payer: Self-pay | Admitting: Pulmonary Disease

## 2012-07-20 DIAGNOSIS — R9431 Abnormal electrocardiogram [ECG] [EKG]: Secondary | ICD-10-CM

## 2012-07-21 ENCOUNTER — Institutional Professional Consult (permissible substitution): Payer: Medicare Other | Admitting: Cardiology

## 2012-07-25 ENCOUNTER — Ambulatory Visit (INDEPENDENT_AMBULATORY_CARE_PROVIDER_SITE_OTHER): Payer: Medicare Other | Admitting: Cardiology

## 2012-07-25 ENCOUNTER — Encounter: Payer: Self-pay | Admitting: Cardiology

## 2012-07-25 VITALS — BP 125/65 | HR 72 | Ht 64.0 in | Wt 174.2 lb

## 2012-07-25 DIAGNOSIS — I359 Nonrheumatic aortic valve disorder, unspecified: Secondary | ICD-10-CM

## 2012-07-25 DIAGNOSIS — R002 Palpitations: Secondary | ICD-10-CM

## 2012-07-25 NOTE — Progress Notes (Signed)
Subjective:    Patient ID: Deanna Olson, female    DOB: 1926/05/17, 79 y.o.   MRN: [id number]  HPI 79 yo female with history of HTN, HLD and aortic stenosis referred by her PCP Dr. Kriste Basque for EKG changes.   1. EKG changes:  Had routine physical with PCP on 1/30 and found to have changes on EKG including QRS widening and bradycardia.  Patient reports she has been asymptomatic and was surprised by EKG findings.  She denies chest pain, palpitations, dyspnea with exertion, pre-syncopal or syncopal symptoms. .  She does use 2 pillows to sleep on at night but this is more for comfort, she can lie flat without any difficulty.  She can walk around her block 10 times without difficulty but does get winded with a flight of stairs. She does have occasional lower extremity swelling and uses intermittent lasix for this. She had an Echo in 07/2011 that showed preserved EF of 55-60% with mild aortic stenosis   Past Medical History  Diagnosis Date  . Allergic rhinitis, cause unspecified   . Acute bronchitis   . Unspecified essential hypertension   . Aortic valve disorders   . Unspecified venous (peripheral) insufficiency   . Other and unspecified hyperlipidemia   . Unspecified hypothyroidism   . Esophageal reflux   . Benign neoplasm of colon   . Osteoarthrosis, unspecified whether generalized or localized, unspecified site   . Myalgia and myositis, unspecified   . Anxiety state, unspecified   . Herpes zoster without mention of complication   . Contact dermatitis and other eczema, due to unspecified cause    Family History  Problem Relation Age of Onset  . Stroke Mother   . Diabetes Mother   . Diabetes Brother   . Heart attack Brother     Deceased age 31  . CAD Brother     Deceased age 60   History   Social History  . Marital Status: Married    Spouse Name: wilbur Dosh    Number of Children: N/A  . Years of Education: N/A   Occupational History  . retired    Social History Main  Topics  . Smoking status: Never Smoker   . Smokeless tobacco: Not on file  . Alcohol Use: Not on file  . Drug Use: Not on file  . Sexually Active: Not on file   Other Topics Concern  . Not on file   Social History Narrative  . No narrative on file    Current Outpatient Prescriptions on File Prior to Visit  Medication Sig Dispense Refill  . ALPRAZolam (XANAX) 0.25 MG tablet Take 1 tablet (0.25 mg total) by mouth 3 (three) times daily as needed for anxiety.  90 tablet  5  . clobetasol (TEMOVATE) 0.05 % external solution Use as directed for scale itch      . desonide (DESOWEN) 0.05 % cream Apply 1 application topically 2 (two) times daily.      . fenofibrate 160 MG tablet TAKE 1 TABLET EVERY DAY  90 tablet  1  . furosemide (LASIX) 40 MG tablet Take 1/2 tablet daily      . ketoconazole (NIZORAL) 2 % shampoo Apply as directed      . levothyroxine (SYNTHROID, LEVOTHROID) 50 MCG tablet TAKE 1 TABLET EVERY DAY  90 tablet  1  . lisinopril (PRINIVIL,ZESTRIL) 20 MG tablet TAKE 1 TABLET EVERY DAY  90 tablet  1  . omeprazole (PRILOSEC) 20 MG capsule TAKE ONE CAPSULE BY MOUTH  EVERY MORNING  90 capsule  1  . simvastatin (ZOCOR) 40 MG tablet TAKE 1 TABLET AT BEDTIME  90 tablet  1  . vitamin C (ASCORBIC ACID) 500 MG tablet Take 500 mg by mouth daily.      . Vitamin D, Ergocalciferol, (DRISDOL) 50000 UNITS CAPS Take 1 capsule (50,000 Units total) by mouth every 7 (seven) days.  12 capsule  3  . Vitamin E 400 UNITS TABS Take 1 tablet by mouth daily.       No current facility-administered medications on file prior to visit.   Allergies  Allergen Reactions  . Sulfonamide Derivatives     REACTION: angioedema     Review of Systems Positive for glasses use and history of heart murmur.  Otherwise per HPI or negative.     Objective:  BP 125/65  Pulse 72  Ht 5\' 4"  (1.626 m)  Wt 174 lb 3.2 oz (79.017 kg)  BMI 29.89 kg/m2   Physical Exam  Constitutional: She appears well-nourished. No distress.   HENT:  Head: Normocephalic and atraumatic.  Neck: Neck supple. No thyromegaly present.  Cardiovascular: Normal rate, regular rhythm and intact distal pulses.   Murmur (2/6 SEM heard throughout, most prominent at LUSB.  There is radiation into the carotids. ) heard. Pulmonary/Chest: Effort normal and breath sounds normal. No respiratory distress. She has no wheezes.  Musculoskeletal: She exhibits no edema.  Neurological: She is alert.       Assessment & Plan:    1. EKG changes:  Compared to previous EKG from 05/02/2002 her rate is slightly slower there is increased widening of the qrs complex.  The possible anteroseptal infarct is present on previous EKG as well.  I do not believe she needs further work up for this at this time and would not put her through a stress test given absence of any symptoms.   2. HTN:  BP well controlled on current medications.  She seems to be tolerating current medications well.  I see no reason to change anything at this time.   3. Aortic stenosis:  Echo from 07/2011 showed valve area is essentially unchanged from prior echo.   She is asymptomatic in regards to this. Plan for her to follow up in two years or sooner if becomes symptomatic.  History and all data above reviewed.  Patient examined.  I agree with the findings as above. Since I last saw her she has had followup echocardiography it demonstrates only mild aortic stenosis. She does have an abnormal EKG with poor anterior R wave progression and QRS widening. However, she denies any symptoms. She denies any shortness of breath, PND or orthopnea. She has no palpitations, presyncope or syncope. She's had no weight gain or edema. She tries to stay active but isn't really exercising. The patient exam reveals [id number]:[id number] peaking systolic murmur  ,  Lungs: Clear  ,  Abd: Positive bowel sounds, no rebound no guarding, Ext No edema  .  All available labs, radiology testing, previous records reviewed. Agree with  documented assessment and plan.   AS:  I would not suggest by exam or history that this is more severe. I do not think further imaging is indicated. We did discuss the possible symptoms develop with worsening AS.  Her EKG is abnormal. However, I was able to find a previous EKG and it is not financially different. I do not suspect obstructive coronary disease or structural heart disease otherwise based on history. Therefore, at this time I don't  think further cardiovascular testing such as stress testing is indicated.  Fayrene Fearing Hochrein  5:36 PM  07/25/2012

## 2012-07-25 NOTE — Patient Instructions (Addendum)
The current medical regimen is effective;  continue present plan and medications.  Follow up in 2 years with Dr Hochrein.  You will receive a letter in the mail 2 months before you are due.  Please call us when you receive this letter to schedule your follow up appointment.  

## 2012-07-27 ENCOUNTER — Encounter: Payer: Self-pay | Admitting: Cardiology

## 2012-08-24 ENCOUNTER — Telehealth: Payer: Self-pay | Admitting: Pulmonary Disease

## 2012-08-24 ENCOUNTER — Other Ambulatory Visit: Payer: Self-pay | Admitting: Pulmonary Disease

## 2012-08-24 MED ORDER — FUROSEMIDE 40 MG PO TABS: ORAL_TABLET | ORAL | Status: DC

## 2012-08-24 MED ORDER — ALPRAZOLAM 0.25 MG PO TABS: 0.2500 mg | ORAL_TABLET | Freq: Three times a day (TID) | ORAL | Status: DC | PRN

## 2012-08-24 NOTE — Telephone Encounter (Signed)
Pt requesting rx for  Xanax 0.25  Lasix 40 mg.  OV scheduled 01-10-13 rx's called in Pt aware  Nothing further  Needed.

## 2012-09-11 ENCOUNTER — Other Ambulatory Visit: Payer: Self-pay | Admitting: Pulmonary Disease

## 2012-10-12 ENCOUNTER — Other Ambulatory Visit: Payer: Self-pay | Admitting: Pulmonary Disease

## 2012-12-01 ENCOUNTER — Other Ambulatory Visit: Payer: Self-pay | Admitting: Pulmonary Disease

## 2012-12-01 ENCOUNTER — Telehealth: Payer: Self-pay | Admitting: Pulmonary Disease

## 2012-12-01 NOTE — Telephone Encounter (Signed)
Pt aware.

## 2012-12-01 NOTE — Telephone Encounter (Signed)
This medications where already sent in by [facility] nurse.   lmtcb x1

## 2012-12-01 NOTE — Telephone Encounter (Signed)
Pt returned call. Deanna Olson  

## 2013-01-01 ENCOUNTER — Other Ambulatory Visit: Payer: Self-pay | Admitting: Pulmonary Disease

## 2013-01-01 ENCOUNTER — Telehealth: Payer: Self-pay | Admitting: Pulmonary Disease

## 2013-01-01 MED ORDER — LISINOPRIL 20 MG PO TABS: ORAL_TABLET | ORAL | Status: DC

## 2013-01-01 NOTE — Telephone Encounter (Signed)
Called and spoke with pt and she is aware of refills sent to her pharmacy for 90 day supply.  Pt is aware and nothing further is needed.

## 2013-01-03 ENCOUNTER — Other Ambulatory Visit: Payer: Self-pay | Admitting: Pulmonary Disease

## 2013-01-10 ENCOUNTER — Telehealth: Payer: Self-pay | Admitting: Pulmonary Disease

## 2013-01-10 ENCOUNTER — Encounter: Payer: Self-pay | Admitting: Pulmonary Disease

## 2013-01-10 ENCOUNTER — Ambulatory Visit (INDEPENDENT_AMBULATORY_CARE_PROVIDER_SITE_OTHER): Payer: Medicare Other | Admitting: Pulmonary Disease

## 2013-01-10 VITALS — BP 138/70 | HR 64 | Temp 97.7°F | Ht 65.0 in | Wt 170.8 lb

## 2013-01-10 DIAGNOSIS — I10 Essential (primary) hypertension: Secondary | ICD-10-CM

## 2013-01-10 DIAGNOSIS — I872 Venous insufficiency (chronic) (peripheral): Secondary | ICD-10-CM

## 2013-01-10 DIAGNOSIS — M1990 Unspecified osteoarthritis, unspecified site: Secondary | ICD-10-CM

## 2013-01-10 DIAGNOSIS — K219 Gastro-esophageal reflux disease without esophagitis: Secondary | ICD-10-CM

## 2013-01-10 DIAGNOSIS — E559 Vitamin D deficiency, unspecified: Secondary | ICD-10-CM

## 2013-01-10 DIAGNOSIS — E785 Hyperlipidemia, unspecified: Secondary | ICD-10-CM

## 2013-01-10 DIAGNOSIS — I359 Nonrheumatic aortic valve disorder, unspecified: Secondary | ICD-10-CM

## 2013-01-10 DIAGNOSIS — F411 Generalized anxiety disorder: Secondary | ICD-10-CM

## 2013-01-10 DIAGNOSIS — E039 Hypothyroidism, unspecified: Secondary | ICD-10-CM

## 2013-01-10 DIAGNOSIS — D126 Benign neoplasm of colon, unspecified: Secondary | ICD-10-CM

## 2013-01-10 NOTE — Telephone Encounter (Signed)
Pt was seen today. Did SN want her to do labs. I didn't see anything in his note. Thanks!

## 2013-01-10 NOTE — Telephone Encounter (Signed)
She had fasting labs in feb 2014 and she is not due for any further labs at this time. thanks

## 2013-01-10 NOTE — Telephone Encounter (Signed)
Pt is aware that she doesn't need any blood work.

## 2013-01-10 NOTE — Progress Notes (Signed)
Subjective:     Patient ID: Deanna Olson, female   DOB: 14-Aug-1925, 79 y.o.   MRN: [id number]  HPI 79 y/o WF here for a follow up visit... she has mult med problems as noted below...  ~  April 01, 2010:  Yearly ROV- still under stress caring for husb, but she has been doing well w/o new complaints or concerns... notes occas HA's c/w muscle contraction & refief by ASA & Alpraz...  BP controlled on meds;  hx mild AS w/o dizzy, syncope, CP, palpit, ch in dyspnea;  Lipids improved w/ diet +meds;  Thyroid stable;  others OK as well- she wants Flu shot today & will ret for fasting blood work.  ~  July 09, 2011:  69mo ROV & she reports doing pretty well w/o new complaints or concerns; she is stressed to the max w/ husb's illness & his care needs; She had the 2012 Flu vaccine 10/12, we did Fasting blood work today & we will sched a follow up 2DEchocardiogram- See prob list below>>   ~  January 11, 2012:  12mo ROV & Deanna Olson is doing satis "just aging problems" and difficulty w/ husb (early dementia & behav changes);  She notes some fatigue & notes that naps help, sleeping ok at night- apparently w/o snoring, restlessness, etc;  Also c/o toes cramping up & she notes OTC Mag is helping & wearing warm socks helps too...  We reviewed prob list, meds, xrays and labs> see below for updates >> LABS 8/13:  FLP- at goals on simva40+Feno160;  Chems- ok w/ BUN=25 Creat=1.4 on Lasix;  CBC- ok;  TSH=3.63 on Levo50;  VitD=18 needs 50K weekly.  ~  July 13, 2012:  12mo ROV & Deanna Olson continues stable- no new complaints or concerns- "pretty good for 58" she says; her daugh notes incr anxiety & she has Alpraz 0.5mg  for prn use...    HBP, mildAS> on Lisin20, Lasix40-1/2; BP= 120/72 & she denies CP, palpit, SOB, syncope, edema; she has seen DrHochrein in the past- but no old notes or EKGs in Epic or [facility] (must have been prior to 2008- in paper chart); mildAS by 2D in 2008 & no change 2/13 w/ no regional wall motion abn  & modLVH seen; she had neg Myoview in 2008; she has NOT been exercising- we will arrange for Cards f/u appt...    Hyperlipid> on Simva40 + Feno160; FLP shows TChol 180, TG 106, HDL 38, LDL 121- not as good as prev & needs better diet...    Hypothy> on Levothyroid50; Labs showed TSH= 4.57 & she is both clinically & biochem euthyroid...    Renal Insuffic> labs showed BUN= 28, Creat= 1.5; she denies voiding issues, etc; we will continue to monitor...    DJD, FM, VitD defic> she uses OTC analgesics as needed & takes VitD 50K weekly; her CC= knees and back & she is encouraged to incr exercise; last VitD level was 8/13 = 18 and 50,000 u weekly started at that time... We reviewed prob list, meds, xrays and labs> see below for updates >> she had the 2013 Flu vaccine 10/13 CXR 1/14 showed borderline hrt size & tort Ao, clear lungs w/ mild hyperinflation, NAD.Marland KitchenMarland Kitchen EKG 1/14 showed SBrady, rate58, LAD, wide qrs, poor r progression- r/o ant scar... LABS 1/14:  FLP- ok but LDL=121 on Simva40 + Feno160;  Chems- ok x BS=110 BUN=28 Creat=1.5;  TSH=4.57   ~  January 10, 2013:  12mo ROV & Deanna Olson is remarkably stable at  77 & the care-giver in her family tending to the healthcare needs of her husb of 73yrs marriage; "I'm doing the best I can" she says, has 3 daughters & extended family help;  After the last visit w/ abnEKG she was seen by DrHochrein for Cards- noted wide complex QRS & bradycardia, HBP, mildAS,  but noted that she was relatively asymptomatic & he was not inclined towards further eval w/ treadmll/ myoview, or Echo, etc (she does not remember the visit she says)...     BP remains well controlled on Lisin20, Lasix20; BP= 138/70 & she denies CP, palpit, SOB, edema, etc...    Lipids have been acceptable on Simva40 + Feno160; her weight is down 4# to 171# today...    She remains stable on the Synthroid 21mcg/d...    She has known mild renal insuffic w/ Cr=1.5 in Feb2014 We reviewed prob list, meds, xrays and labs> see  below for updates >>            Problem List:  ALLERGIC RHINITIS (ICD-477.9) - she uses OTC Claritin as needed... She has seen DrCrossley for ENT.  Hx of ASTHMATIC BRONCHITIS, ACUTE (ICD-466.0) - prev on Advair- using Prn & doing well & denies breathing problems- without AB exac. ~  CXR 4/08 showed normal heart size, clear lungs, NAD...  HYPERTENSION (ICD-401.9) - on LISINOPRIL 20mg /d and LASIX 40mg - taking 1/2 tab daily...  ~  10/11: BP= 120/60 and doing well... denies HA, fatigue, visual changes, CP, palipit, dizziness, syncope, dyspnea, edema, etc... ~  1/13: BP= 122/68 & she remains asymptomatic... ~  7/13:  BP= 130/62 and she denies CP, palpit, SOB, edema...  ~  1/14: on Lisin20, Lasix40-1/2; BP= 120/72 & she denies CP, palpit, SOB, syncope, edema. ~  7/14: BP remains well controlled on Lisin20, Lasix20; BP= 138/70 & she denies CP, palpit, SOB, edema, etc.  AORTIC VALVE DISEASE (ICD-424.1) - Cardiology eval 2008 by DrHochrein... known mild Ao Stenosis w/ 2DEcho 6/07 showing mild incr AoV thickness, mild reduced AoV leaflet excursions, AoV sclerosis w/o stenosis, norm LVF w/ EF= 65%. ~  NuclearStressTest 5/08 was neg- no ischemia, no infarct, EF= 80%... ~  2DEcho 10/09 showed norm LV sys function w/ EF= 55-60%, no regional wall motion abn, mild incr LVwall thickness, mild DD, mild calcif AoV, low-nl leaflet excursion- c/w mild AS. ~  note- CDopplers 10/09 were OK- mild plaque on right, 0-39% bilat... ~  1/13: she is in need of a f/u 2DEcho to check her AS- murmur sl louder now- denies CP, palpit,dizzy, syncope, SOB, edema, or cerebral ischemic symptoms... ~  2DEcho 2/13 showed mod LVH, norm sys function w/ EF=55-60%, no regional wall motion abn, Gr1DD, mildAS, mildMR, PAsys=34... ~  1/14: mildAS by 2D in 2008 & no change 2/13 w/ no regional wall motion abn & modLVH seen; she had neg Myoview in 2008; she has NOT been exercising- we will arrange for Cards f/u appt...  ~  2/14: she saw  DrHochrein for Cards eval> abnEKG- noted wide complex QRS & bradycardia, HBP, mildAS, but noted that she was asymptomatic & he was not inclined towards further eval w/ treadmll/ myoview, or Echo, etc  VENOUS INSUFFICIENCY (ICD-459.81) - she follows a low sodium diet, elevates legs and uses support hose as needed.  HYPERLIPIDEMIA (ICD-272.4) - on SIMVASTATIN 40mg /d & FENOFIBRATE 160mg /d + low fat diet. ~  FLP 2/08 showed TChol 155, TG 227, HDL 35, LDL 100 ~  FLP 5/09 showed TChol 175, TG 170, HDL 30,  LDL 111 ~  FLP 2/10 showed TChol 181, TG 198, HDL 35, LDL 106... rec> add FENOFIBRATE 160mg /d. ~  FLP 10/10 showed TChol 183, TG 109, HDL 40, LDL 121 ~  FLP 10/11 on Simva40+FENO160 showed TChol 177, TG 135, HDL 34, LDL 116 ~  FLP 7/12 on Simva40+FENO160 showed TChol 169, TG 115, HDL 41, LDL 105 ~  FLP 1/13 on Simva40+Feno160 showed TChol 183, TG 116, HDL 45, LDL 115 ~  FLP 8/13 on Simva40+Feno160 showed TChol 162, TG 105, HDL 45, LDL 96 ~  FLP 1/14 on Simva40+Feno160 showed TChol 180, TG 106, HDL 38, LDL 121- not as good as prev & needs better diet.Marland Kitchen  HYPOTHYROIDISM (ICD-244.9) - on LEVOTHYROID 63mcg/d... and clinically euthyroid... ~  last TSH 2/08 was 3.87 ~  labs 5/09 showed TSH= 3.55 ~  labs 10/10 showed TSH= 4.42 ~  labs 10/11 showed TSH= 5.00 ~  Labs 7/12 on Levo50 showed TSH= 3.89 ~  Labs 1/13 on Levo50 showed TSH= 5.86... Reminded to take it every day... ~  Labs 8/13 on Levo50 showed TSH= 3.63 ~  Labs 1/14 on Levo50 showed TSH= 4.57  GERD (ICD-530.81) - on OMEPRAZOLE 20mg /d... she is asymptomatic... ~  EGD in 1989 by DrPatterson showed smHH, otherw neg... subseq 24H pH probe was normal...  COLONIC POLYPS (ICD-211.3) - tiny hyperplastic polyp removed via FlexSig 1994... no f/u since then, but she denies abd discomfort, d/c/bl etc...  MILD RENAL INSUFFIC >> ~  Labs 1/13 showed BUN= 27, Creat= 1.4 ~  Labs 8/13 showed BUN= 25, Creat= 1.4... Stable on Lasix40- taking 1/2 tab  daily... ~  Labs 1/14 showed BUN= 28, Creat= 1.5 and she continues on Lisin20 & Lasix20.  DEGENERATIVE JOINT DISEASE (ICD-715.90) - eval by DrOlin 2006 w/ bilat knee osteoarthritis... she uses OTC meds for pain... ~  1/13: she notes some intermittent LBP & uses husb's Vicodin 1/2 tab prn...  FIBROMYALGIA (ICD-729.1) - not currently on meds...  VITAMIN D DEFICIENCY >> ~  Labs 7/12 showed Vit D = 24 & rec to take Vit D 50K weekly... ~  Labs 8/13 showed Vit D level = 18... rec to take VitD 50K every week...  ANXIETY (ICD-300.00) - uses ALPRAZOLAM 0.25mg  as needed... under stress w/ MrLinker going to the wound care clinic & early signs of dementia... ~  7/13:  As noted she is under incr stress, we gave husb some Seroquel to see if it helps his behavior, she takes alpraz Bid too...  Hx of SHINGLES (ICD-053.9)  RASH - she sees DrLupton for Derm> she notes intertriginous rash & we will attempt Rx w/ LOTRISONE Cream Bid & she uses DESONIDE 0.05% for ears...   Past Surgical History  Procedure Laterality Date  . Vesicovaginal fistula closure w/ tah      Outpatient Encounter Prescriptions as of 01/10/2013  Medication Sig Dispense Refill  . ALPRAZolam (XANAX) 0.25 MG tablet Take 1 tablet (0.25 mg total) by mouth 3 (three) times daily as needed for anxiety.  90 tablet  5  . clobetasol (TEMOVATE) 0.05 % external solution Use as directed for scale itch      . desonide (DESOWEN) 0.05 % cream Apply 1 application topically 2 (two) times daily.      . fenofibrate 160 MG tablet TAKE 1 TABLET EVERY DAY  90 tablet  0  . furosemide (LASIX) 40 MG tablet Take 1/2 tablet daily  30 tablet  5  . ketoconazole (NIZORAL) 2 % shampoo Apply as directed      .  levothyroxine (SYNTHROID, LEVOTHROID) 50 MCG tablet TAKE 1 TABLET EVERY DAY  90 tablet  1  . lisinopril (PRINIVIL,ZESTRIL) 20 MG tablet TAKE 1 TABLET EVERY DAY  90 tablet  1  . omeprazole (PRILOSEC) 20 MG capsule TAKE ONE CAPSULE BY MOUTH EVERY MORNING  90  capsule  0  . simvastatin (ZOCOR) 40 MG tablet TAKE 1 TABLET AT BEDTIME  90 tablet  1  . vitamin C (ASCORBIC ACID) 500 MG tablet Take 500 mg by mouth daily.      . Vitamin D, Ergocalciferol, (DRISDOL) 50000 UNITS CAPS Take 1 capsule (50,000 Units total) by mouth every 7 (seven) days.  12 capsule  3  . Vitamin E 400 UNITS TABS Take 1 tablet by mouth daily.      . [DISCONTINUED] lisinopril (PRINIVIL,ZESTRIL) 20 MG tablet TAKE 1 TABLET EVERY DAY  90 tablet  1   No facility-administered encounter medications on file as of 01/10/2013.    Allergies  Allergen Reactions  . Sulfonamide Derivatives     REACTION: angioedema    Current Medications, Allergies, Past Medical History, Past Surgical History, Family History, and Social History were reviewed in [employer] record.    Review of Systems       See HPI - all other systems neg except as noted...  The patient denies anorexia, fever, weight loss, weight gain, vision loss, decreased hearing, hoarseness, chest pain, syncope, dyspnea on exertion, peripheral edema, prolonged cough, headaches, hemoptysis, abdominal pain, melena, hematochezia, severe indigestion/heartburn, hematuria, incontinence, muscle weakness, suspicious skin lesions, transient blindness, difficulty walking, depression, unusual weight change, abnormal bleeding, enlarged lymph nodes, and angioedema.     Objective:   Physical Exam     WD, Overweight, 79 y/o WF in NAD...  GENERAL:  Alert & oriented; pleasant & cooperative... HEENT:  [place]/AT, EOM-wnl, PERRLA, EACs-clear, TMs-wnl, NOSE-clear, THROAT-clear & wnl. NECK:  Supple w/ fairROM; no JVD; no thyromegaly or nodules palpated; no lymphadenopathy;  transmit murmur in base of neck to carotids... CHEST:  Clear to P & A; without wheezes/ rales/ or rhonchi. HEART:  Regular Rhythm;  gr1-2/6 SEM at base w/ rad to neck; without rubs or gallops heard... ABDOMEN:  Soft & nontender; + panniculus; normal bowel sounds;  no organomegaly or masses detected. EXT: without deformities, mild arthritic changes; no varicose veins/ +venous insuffic/ tr. -1+ edema. NEURO:  CN's intact;  no focal neuro deficits... DERM:  Prev Intertrig rash on groin/ abd is resolved  RADIOLOGY DATA:  Reviewed in the EPIC EMR & discussed w/ the patient...   LABORATORY DATA:  Reviewed in the EPIC EMR & discussed w/ the patient...   Assessment:      Hx AB & AR>  No problems, no recent resp exac & doing well overall w/o meds required...  HBP>  Controlled on Lisinopril & Lasix; she remains asymptomatic...  Ao Valve dis>  She has mild AS & an abn EKG- followed by DrHochrein...  Ven Insuffic>  She knows to elim sodium, elevate legs, wear support hose...  Hyperlipidemia>  On Simva40 + Fenofib160;  Labs reviewed, needs better diet, exercise, get wt down...  Hypothyroid>  On Levothyroid 60mcg/d & clinically euthyroid; reminded to take med every day...  GERD>  On Omep & doing well...  DJD/ LBP>  She has mild ortho complaints; uses 1/2 Vicodin for relief as needed...  Vit D Defic>  Vit D level was low; asked to take the VitD 50K every week...  Anxiety>  On Alprazolam as needed.Marland KitchenMarland Kitchen  Derm>  Rash treated by DrLupton...     Plan:     Patient's Medications  New Prescriptions   No medications on file  Previous Medications   ALPRAZOLAM (XANAX) 0.25 MG TABLET    Take 1 tablet (0.25 mg total) by mouth 3 (three) times daily as needed for anxiety.   CLOBETASOL (TEMOVATE) 0.05 % EXTERNAL SOLUTION    Use as directed for scale itch   DESONIDE (DESOWEN) 0.05 % CREAM    Apply 1 application topically 2 (two) times daily.   FENOFIBRATE 160 MG TABLET    TAKE 1 TABLET EVERY DAY   FUROSEMIDE (LASIX) 40 MG TABLET    Take 1/2 tablet daily   KETOCONAZOLE (NIZORAL) 2 % SHAMPOO    Apply as directed   LEVOTHYROXINE (SYNTHROID, LEVOTHROID) 50 MCG TABLET    TAKE 1 TABLET EVERY DAY   LISINOPRIL (PRINIVIL,ZESTRIL) 20 MG TABLET    TAKE 1 TABLET EVERY DAY    OMEPRAZOLE (PRILOSEC) 20 MG CAPSULE    TAKE ONE CAPSULE BY MOUTH EVERY MORNING   SIMVASTATIN (ZOCOR) 40 MG TABLET    TAKE 1 TABLET AT BEDTIME   VITAMIN C (ASCORBIC ACID) 500 MG TABLET    Take 500 mg by mouth daily.   VITAMIN D, ERGOCALCIFEROL, (DRISDOL) 50000 UNITS CAPS    Take 1 capsule (50,000 Units total) by mouth every 7 (seven) days.   VITAMIN E 400 UNITS TABS    Take 1 tablet by mouth daily.  Modified Medications   No medications on file  Discontinued Medications   LISINOPRIL (PRINIVIL,ZESTRIL) 20 MG TABLET    TAKE 1 TABLET EVERY DAY

## 2013-01-10 NOTE — Patient Instructions (Addendum)
Today we updated your med list in our EPIC system...    Continue your current medications the same...  Call for any questions...  Let's plan a follow up visit in 6mo, sooner if needed for problems...   

## 2013-01-25 ENCOUNTER — Telehealth: Payer: Self-pay | Admitting: Pulmonary Disease

## 2013-01-25 MED ORDER — AZITHROMYCIN 250 MG PO TABS: ORAL_TABLET | ORAL | Status: DC

## 2013-01-25 NOTE — Telephone Encounter (Signed)
Per SN: Okay for ZPAK #1, mucinex DM OTC 2 po BID and increase fluids   i called and spoke with pt and is aware of recs. Nothing further  neeeded

## 2013-01-25 NOTE — Telephone Encounter (Signed)
I spoke with pt and she c/o deep cough w/ slight yellow phlem, chills x couple days. She stated she has no fever, no wheezing, no chest tx. She is using hall cough drops and alka seltzer for cold. She stated during the rainy days her a/c stopped working. Pt is requesting further recs. Please advise SN thanks Last OV 01/10/13 Pending 07/17/13 Allergies  Allergen Reactions  . Sulfonamide Derivatives     REACTION: angioedema

## 2013-03-02 ENCOUNTER — Other Ambulatory Visit: Payer: Self-pay | Admitting: Pulmonary Disease

## 2013-03-02 ENCOUNTER — Telehealth: Payer: Self-pay | Admitting: Pulmonary Disease

## 2013-03-02 MED ORDER — OMEPRAZOLE 20 MG PO CPDR: DELAYED_RELEASE_CAPSULE | ORAL | Status: DC

## 2013-03-02 MED ORDER — FENOFIBRATE 160 MG PO TABS: ORAL_TABLET | ORAL | Status: DC

## 2013-03-02 NOTE — Telephone Encounter (Signed)
Pt called back and holly made the pt aware that the meds have been sent in to the pharmacy.

## 2013-03-02 NOTE — Telephone Encounter (Addendum)
Pt called back.  Advised pt that meds were sent to pharmacy.  Pt verbalized understanding & states nothing further needed at this time.  Deanna Olson

## 2013-03-02 NOTE — Telephone Encounter (Signed)
attemtped to call pt to make her award of meds sent to the pharmacy.  i will try back in a little to speak with the pt.

## 2013-03-06 ENCOUNTER — Other Ambulatory Visit: Payer: Self-pay | Admitting: Pulmonary Disease

## 2013-03-19 ENCOUNTER — Telehealth: Payer: Self-pay | Admitting: Pulmonary Disease

## 2013-03-19 MED ORDER — LEVOTHYROXINE SODIUM 50 MCG PO TABS: ORAL_TABLET | ORAL | Status: DC

## 2013-03-19 NOTE — Telephone Encounter (Signed)
Pt aware rx has been sent. Nothing further needed 

## 2013-03-21 ENCOUNTER — Ambulatory Visit (INDEPENDENT_AMBULATORY_CARE_PROVIDER_SITE_OTHER): Payer: Medicare Other

## 2013-03-21 DIAGNOSIS — Z23 Encounter for immunization: Secondary | ICD-10-CM

## 2013-03-22 DIAGNOSIS — Z23 Encounter for immunization: Secondary | ICD-10-CM

## 2013-04-04 ENCOUNTER — Other Ambulatory Visit: Payer: Self-pay | Admitting: Pulmonary Disease

## 2013-05-06 ENCOUNTER — Other Ambulatory Visit: Payer: Self-pay | Admitting: Pulmonary Disease

## 2013-07-17 ENCOUNTER — Ambulatory Visit (INDEPENDENT_AMBULATORY_CARE_PROVIDER_SITE_OTHER): Payer: Medicare HMO | Admitting: Pulmonary Disease

## 2013-07-17 ENCOUNTER — Encounter: Payer: Self-pay | Admitting: Pulmonary Disease

## 2013-07-17 VITALS — BP 132/60 | HR 63 | Temp 97.8°F | Ht 65.0 in | Wt 172.8 lb

## 2013-07-17 DIAGNOSIS — K219 Gastro-esophageal reflux disease without esophagitis: Secondary | ICD-10-CM

## 2013-07-17 DIAGNOSIS — E559 Vitamin D deficiency, unspecified: Secondary | ICD-10-CM

## 2013-07-17 DIAGNOSIS — I872 Venous insufficiency (chronic) (peripheral): Secondary | ICD-10-CM

## 2013-07-17 DIAGNOSIS — I10 Essential (primary) hypertension: Secondary | ICD-10-CM

## 2013-07-17 DIAGNOSIS — M1990 Unspecified osteoarthritis, unspecified site: Secondary | ICD-10-CM

## 2013-07-17 DIAGNOSIS — D126 Benign neoplasm of colon, unspecified: Secondary | ICD-10-CM

## 2013-07-17 DIAGNOSIS — I359 Nonrheumatic aortic valve disorder, unspecified: Secondary | ICD-10-CM

## 2013-07-17 DIAGNOSIS — E785 Hyperlipidemia, unspecified: Secondary | ICD-10-CM

## 2013-07-17 DIAGNOSIS — E039 Hypothyroidism, unspecified: Secondary | ICD-10-CM

## 2013-07-17 DIAGNOSIS — Z23 Encounter for immunization: Secondary | ICD-10-CM

## 2013-07-17 DIAGNOSIS — F411 Generalized anxiety disorder: Secondary | ICD-10-CM

## 2013-07-17 MED ORDER — ALPRAZOLAM 0.25 MG PO TABS: ORAL_TABLET | ORAL | Status: DC

## 2013-07-17 NOTE — Progress Notes (Signed)
Subjective:     Patient ID: Junius Finner, female   DOB: 1926/01/29, 80 y.o.   MRN: [id number]  HPI 80 y/o WF here for a follow up visit... she has mult med problems as noted below...  ~  January 11, 2012:  11mo ROV & [name] is doing satis "just aging problems" and difficulty w/ husb (early dementia & behav changes);  She notes some fatigue & notes that naps help, sleeping ok at night- apparently w/o snoring, restlessness, etc;  Also c/o toes cramping up & she notes OTC Mag is helping & wearing warm socks helps too...  We reviewed prob list, meds, xrays and labs> see below for updates >>  LABS 8/13:  FLP- at goals on simva40+Feno160;  Chems- ok w/ BUN=25 Creat=1.4 on Lasix;  CBC- ok;  TSH=3.63 on Levo50;  VitD=18 needs 50K weekly.  ~  July 13, 2012:  62mo ROV & Abigael continues stable- no new complaints or concerns- "pretty good for 78" she says; her daugh notes incr anxiety & she has Alpraz 0.5mg  for prn use...    HBP, mildAS> on Lisin20, Lasix40-1/2; BP= 120/72 & she denies CP, palpit, SOB, syncope, edema; she has seen DrHochrein in the past- but no old notes or EKGs in Epic or [facility] (must have been prior to 2008- in paper chart); mildAS by 2D in 2008 & no change 2/13 w/ no regional wall motion abn & modLVH seen; she had neg Myoview in 2008; she has NOT been exercising- we will arrange for Cards f/u appt...    Hyperlipid> on Simva40 + Feno160; FLP shows TChol 180, TG 106, HDL 38, LDL 121- not as good as prev & needs better diet...    Hypothy> on Levothyroid50; Labs showed TSH= 4.57 & she is both clinically & biochem euthyroid...    Renal Insuffic> labs showed BUN= 28, Creat= 1.5; she denies voiding issues, etc; we will continue to monitor...    DJD, FM, VitD defic> she uses OTC analgesics as needed & takes VitD 50K weekly; her CC= knees and back & she is encouraged to incr exercise; last VitD level was 8/13 = 18 and 50,000 u weekly started at that time... We reviewed prob list, meds, xrays  and labs> see below for updates >> she had the 2013 Flu vaccine 10/13  CXR 1/14 showed borderline hrt size & tort Ao, clear lungs w/ mild hyperinflation, NAD.Marland KitchenMarland Kitchen  EKG 1/14 showed SBrady, rate58, LAD, wide qrs, poor r progression- r/o ant scar...  LABS 1/14:  FLP- ok but LDL=121 on Simva40 + Feno160;  Chems- ok x BS=110 BUN=28 Creat=1.5;  TSH=4.57   ~  January 10, 2013:  24mo ROV & Autumne is remarkably stable at 64 & the care-giver in her family tending to the healthcare needs of her husb of 47yrs marriage; "I'm doing the best I can" she says, has 3 daughters & extended family help;  After the last visit w/ abnEKG she was seen by DrHochrein for Cards- noted wide complex QRS & bradycardia, HBP, mildAS,  but noted that she was relatively asymptomatic & he was not inclined towards further eval w/ treadmll/ myoview, or Echo, etc (she does not remember the visit she says)...     BP remains well controlled on Lisin20, Lasix20; BP= 138/70 & she denies CP, palpit, SOB, edema, etc...    Lipids have been acceptable on Simva40 + Feno160; her weight is down 4# to 171# today...    She remains stable on the Synthroid 25mcg/d.Marland KitchenMarland Kitchen  She has known mild renal insuffic w/ Cr=1.5 in Feb2014 We reviewed prob list, meds, xrays and labs> see below for updates >>   ~  July 17, 2013:  43mo ROV & MrLinker passed away 10/14her daughter is concerned about pt's anxiety (they describe occas panic attacks) and her hearing; pt refuses referral for hearing eval and she reuses referral for counseling for her anxiety; we then discussed trying the Alprazolam 0.25mg  Tid regularly to see if this helps w/ her anxiety & she will report to me the response...     HOH> daughter is concerned, pt refuses hearing eval & asked to call for referral when ready...     HBP, mildAS> on Lisin20, Lasix40-1/2; BP= 132/60 & she denies CP, palpit, SOB, syncope, edema; she has seen DrHochrein prior to 2008; mildAS by 2D in 2008 & no change 2/13 w/ no regional  wall motion abn & modLVH seen; she had neg Myoview in 2008; she  Had f/u DrHochrein 2/14- noted wide complex QRS & bradycardia, HBP, mildAS, but noted that she was relatively asymptomatic & he was not inclined towards further eval w/ treadmll/ myoview, or Echo, etc...    Hyperlipid> on Simva40 + Feno160; FLP 2/15 shows TChol 173, TG 87, HDL 38, LDL 118- she still needs better diet...    Hypothy> on Levothyroid50; Labs 2/15 showed TSH= 4.84 & she is both clinically & biochem euthyroid...    Renal Insuffic> labs 2/15 showed BUN= 23, Creat= 1.4 (stable); she denies voiding issues, etc; we will continue to monitor...    DJD, FM, VitD defic> she uses OTC analgesics as needed & takes VitD 50K weekly; her CC= knees and back & she is encouraged to incr exercise; VitD level 2.15 = 22 & reminded to take 50K weekly!    Anxiety & panic> as above, husb passed 10/14, here w/ daughter, pt refuses counseling- rec ALPRAZ0.25 Tid... We reviewed prob list, meds, xrays and labs> see below for updates >> she was given the PNEUMOVAX today; and Rx written for Shingles vaccine.Marland Kitchen  LABS 2/15:  FLP- ok x LDL=118;  Chems- ok x Cr=1.4;  CBC- ok w/ Hg=12.6;  TSH=4.84;  VitD=22...            Problem List:  ALLERGIC RHINITIS (ICD-477.9) - she uses OTC Claritin as needed... She has seen DrCrossley for ENT.  Hx of ASTHMATIC BRONCHITIS, ACUTE (ICD-466.0) - prev on Advair- using Prn & doing well & denies breathing problems- without AB exac. ~  CXR 4/08 showed normal heart size, clear lungs, NAD...  HYPERTENSION (ICD-401.9) - on LISINOPRIL 20mg /d and LASIX 40mg - taking 1/2 tab daily...  ~  10/11: BP= 120/60 and doing well... denies HA, fatigue, visual changes, CP, palipit, dizziness, syncope, dyspnea, edema, etc... ~  1/13: BP= 122/68 & she remains asymptomatic... ~  7/13:  BP= 130/62 and she denies CP, palpit, SOB, edema...  ~  1/14: on Lisin20, Lasix40-1/2; BP= 120/72 & she denies CP, palpit, SOB, syncope, edema. ~  7/14: BP  remains well controlled on Lisin20, Lasix20; BP= 138/70 & she denies CP, palpit, SOB, edema, etc. ~  2/15: on Lisin20, Lasix40-1/2; BP= 132/60 & she denies CP, palpit, SOB, syncope, edema;   AORTIC VALVE DISEASE (ICD-424.1) - Cardiology eval 2008 by [facility]... known mild Ao Stenosis w/ 2DEcho 6/07 showing mild incr AoV thickness, mild reduced AoV leaflet excursions, AoV sclerosis w/o stenosis, norm LVF w/ EF= 65%. ~  NuclearStressTest 5/08 was neg- no ischemia, no infarct, EF= 80%... ~  2DEcho  10/09 showed norm LV sys function w/ EF= 55-60%, no regional wall motion abn, mild incr LVwall thickness, mild DD, mild calcif AoV, low-nl leaflet excursion- c/w mild AS. ~  note- CDopplers 10/09 were OK- mild plaque on right, 0-39% bilat... ~  1/13: she is in need of a f/u 2DEcho to check her AS- murmur sl louder now- denies CP, palpit,dizzy, syncope, SOB, edema, or cerebral ischemic symptoms... ~  2DEcho 2/13 showed mod LVH, norm sys function w/ EF=55-60%, no regional wall motion abn, Gr1DD, mildAS, mildMR, PAsys=34... ~  1/14: mildAS by 2D in 2008 & no change 2/13 w/ no regional wall motion abn & modLVH seen; she had neg Myoview in 2008; she has NOT been exercising- we will arrange for Cards f/u appt...  ~  2/14: she saw [facility] for Cards eval> abnEKG- noted wide complex QRS & bradycardia, HBP, mildAS, but noted that she was asymptomatic & he was not inclined towards further eval w/ treadmll/ myoview, or Echo, etc ~  2/15: on Lisin20, Lasix40-1/2; she has seen DrHochrein prior to 2008; mildAS by 2D in 2008 & no change 2/13 w/ no regional wall motion abn & modLVH seen; she had neg Myoview in 2008; she  Had f/u DrHochrein 2/14- noted wide complex QRS & bradycardia, HBP, mildAS, but noted that she was relatively asymptomatic & he was not inclined towards further eval w/ treadmll/ myoview, or Echo, etc...  VENOUS INSUFFICIENCY (ICD-459.81) - she follows a low sodium diet, elevates legs and uses support hose  as needed.  HYPERLIPIDEMIA (ICD-272.4) - on SIMVASTATIN 40mg /d & FENOFIBRATE 160mg /d + low fat diet. ~  FLP 2/08 showed TChol 155, TG 227, HDL 35, LDL 100 ~  FLP 5/09 showed TChol 175, TG 170, HDL 30, LDL 111 ~  FLP 2/10 showed TChol 181, TG 198, HDL 35, LDL 106... rec> add FENOFIBRATE 160mg /d. ~  FLP 10/10 showed TChol 183, TG 109, HDL 40, LDL 121 ~  FLP 10/11 on Simva40+FENO160 showed TChol 177, TG 135, HDL 34, LDL 116 ~  FLP 7/12 on Simva40+FENO160 showed TChol 169, TG 115, HDL 41, LDL 105 ~  FLP 1/13 on Simva40+Feno160 showed TChol 183, TG 116, HDL 45, LDL 115 ~  FLP 8/13 on Simva40+Feno160 showed TChol 162, TG 105, HDL 45, LDL 96 ~  FLP 1/14 on Simva40+Feno160 showed TChol 180, TG 106, HDL 38, LDL 121- not as good as prev & needs better diet.. ~  [place] 2/15 on Simva40+Feno160 showed TChol 173, TG 87, HDL 38, LDL 118  HYPOTHYROIDISM (ICD-244.9) - on LEVOTHYROID 52mcg/d... and clinically euthyroid... ~  last TSH 2/08 was 3.87 ~  labs 5/09 showed TSH= 3.55 ~  labs 10/10 showed TSH= 4.42 ~  labs 10/11 showed TSH= 5.00 ~  Labs 7/12 on Levo50 showed TSH= 3.89 ~  Labs 1/13 on Levo50 showed TSH= 5.86... Reminded to take it every day... ~  Labs 8/13 on Levo50 showed TSH= 3.63 ~  Labs 1/14 on Levo50 showed TSH= 4.57   Labs 2/15 on Levo50 showed TSH= 4.84  GERD (ICD-530.81) - on OMEPRAZOLE 20mg /d... she is asymptomatic... ~  EGD in 1989 by DrPatterson showed Wiggins, otherw neg... subseq 24H pH probe was normal...  COLONIC POLYPS (ICD-211.3) - tiny hyperplastic polyp removed via [place]... no f/u since then, but she denies abd discomfort, d/c/bl etc...  MILD RENAL INSUFFIC >> ~  Labs 1/13 showed BUN= 27, Creat= 1.4 ~  Labs 8/13 showed BUN= 25, Creat= 1.4... Stable on Lasix40- taking 1/2 tab daily... ~  Labs 1/14 showed BUN= 28, Creat= 1.5 and she continues on [place]. ~  Labs 2/15 showed BUN= 23, Creat= 1.4 on same meds.  DEGENERATIVE JOINT DISEASE (ICD-715.90) - eval by  DrOlin 2006 w/ bilat knee osteoarthritis... she uses OTC meds for pain... ~  1/13: she notes some intermittent LBP & uses husb's Vicodin 1/2 tab prn...  FIBROMYALGIA (ICD-729.1) - not currently on meds...  VITAMIN D DEFICIENCY >> ~  Labs 7/12 showed Vit D = 24 & rec to take Vit D 50K weekly... ~  Labs 8/13 showed Vit D level = 18... rec to take VitD 50K every week... ~  Labs 2/15 showed VitD level = 22... rec to take VitD 50K weekly!  ANXIETY (ICD-300.00) - uses ALPRAZOLAM 0.25mg  as needed... under stress w/ MrLinker going to the wound care clinic & early signs of dementia... ~  7/13:  As noted she is under incr stress, we gave husb some Seroquel to see if it helps his behavior, she takes alpraz Bid too... ~  2/15: her husb passed 10/14; she3 is anxious & has what sounds like panic attacks; she refuses counseling; asked to try Alpraz0.25Tid regularly...  Hx of SHINGLES (ICD-053.9)  RASH - she sees DrLupton for Derm> she notes intertriginous rash & we will attempt Rx w/ LOTRISONE Cream Bid & she uses DESONIDE 0.05% for ears...   Past Surgical History  Procedure Laterality Date  . Vesicovaginal fistula closure w/ tah      Outpatient Encounter Prescriptions as of 07/17/2013  Medication Sig  . ALPRAZolam (XANAX) 0.25 MG tablet TAKE 1 TABLET BY MOUTH 3 TIMES A DAY AS NEEDED  . clobetasol (TEMOVATE) 0.05 % external solution Use as directed for scale itch  . desonide (DESOWEN) 0.05 % cream Apply 1 application topically 2 (two) times daily.  . fenofibrate 160 MG tablet TAKE 1 TABLET EVERY DAY  . furosemide (LASIX) 40 MG tablet Take 1/2 tablet daily  . ketoconazole (NIZORAL) 2 % shampoo Apply as directed  . levothyroxine (SYNTHROID, LEVOTHROID) 50 MCG tablet TAKE 1 TABLET EVERY DAY  . lisinopril (PRINIVIL,ZESTRIL) 20 MG tablet TAKE 1 TABLET EVERY DAY  . omeprazole (PRILOSEC) 20 MG capsule TAKE ONE CAPSULE BY MOUTH EVERY MORNING  . simvastatin (ZOCOR) 40 MG tablet TAKE 1 TABLET AT BEDTIME  .  vitamin C (ASCORBIC ACID) 500 MG tablet Take 500 mg by mouth daily.  . Vitamin D, Ergocalciferol, (DRISDOL) 50000 UNITS CAPS Take 1 capsule (50,000 Units total) by mouth every 7 (seven) days.  . Vitamin E 400 UNITS TABS Take 1 tablet by mouth daily.  . [DISCONTINUED] azithromycin (ZITHROMAX) 250 MG tablet Take as directed  . [DISCONTINUED] omeprazole (PRILOSEC) 20 MG capsule TAKE ONE CAPSULE BY MOUTH EVERY MORNING    Allergies  Allergen Reactions  . Sulfonamide Derivatives     REACTION: angioedema    Current Medications, Allergies, Past Medical History, Past Surgical History, Family History, and Social History were reviewed in [employer] record.    Review of Systems       See HPI - all other systems neg except as noted...  The patient denies anorexia, fever, weight loss, weight gain, vision loss, decreased hearing, hoarseness, chest pain, syncope, dyspnea on exertion, peripheral edema, prolonged cough, headaches, hemoptysis, abdominal pain, melena, hematochezia, severe indigestion/heartburn, hematuria, incontinence, muscle weakness, suspicious skin lesions, transient blindness, difficulty walking, depression, unusual weight change, abnormal bleeding, enlarged lymph nodes, and angioedema.     Objective:   Physical Exam  WD, Overweight, 80 y/o WF in NAD...  GENERAL:  Alert & oriented; pleasant & cooperative... HEENT:  [place]/AT, EOM-wnl, PERRLA, EACs-clear, TMs-wnl, NOSE-clear, THROAT-clear & wnl. NECK:  Supple w/ fairROM; no JVD; no thyromegaly or nodules palpated; no lymphadenopathy;  transmit murmur in base of neck to carotids... CHEST:  Clear to P & A; without wheezes/ rales/ or rhonchi. HEART:  Regular Rhythm;  gr1-2/6 SEM at base w/ rad to neck; without rubs or gallops heard... ABDOMEN:  Soft & nontender; + panniculus; normal bowel sounds; no organomegaly or masses detected. EXT: without deformities, mild arthritic changes; no varicose veins/ +venous  insuffic/ tr. -1+ edema. NEURO:  CN's intact;  no focal neuro deficits... DERM:  Prev Intertrig rash on groin/ abd is resolved  RADIOLOGY DATA:  Reviewed in the EPIC EMR & discussed w/ the patient...   LABORATORY DATA:  Reviewed in the EPIC EMR & discussed w/ the patient...   Assessment:      Hx AB & AR>  No problems, no recent resp exac & doing well overall w/o meds required...  HBP>  Controlled on Lisinopril & Lasix; she remains asymptomatic...  Ao Valve dis>  She has mild AS & an abn EKG- followed by DrHochrein...  Ven Insuffic>  She knows to elim sodium, elevate legs, wear support hose...  Hyperlipidemia>  On Simva40 + Fenofib160;  Labs reviewed, needs better diet, exercise, get wt down...  Hypothyroid>  On Levothyroid 4mcg/d & clinically euthyroid; reminded to take med every day...  GERD>  On Omep & doing well...  DJD/ LBP>  She has mild ortho complaints; uses 1/2 Vicodin for relief as needed...  Vit D Defic>  Vit D level was low; asked to take the VitD 50K every week...  Anxiety>  She has Alpraz0.25 & asked to try it Tid regularly...  Derm>  Rash treated by DrLupton...     Plan:     Meds as above.Marland KitchenMarland Kitchen

## 2013-07-17 NOTE — Patient Instructions (Signed)
Today we updated your med list in our EPIC system...    Continue your current medications the same...  We decided to try the ALPRAZOLAM 0.25mg  three times daily to see if it helps w/ anxiety, panic attacks, and helps you rest better...    Let me know if you would like to reconsider counseling...  Please return to our lab one morning this week for your FASTING blood work...    We will contact you w/ the results when available...   Call for any questions or if I can be of service in any way...  You should have a general medical follow up in about 6 months.Marland KitchenMarland Kitchen

## 2013-07-18 ENCOUNTER — Other Ambulatory Visit (INDEPENDENT_AMBULATORY_CARE_PROVIDER_SITE_OTHER): Payer: Medicare HMO

## 2013-07-18 DIAGNOSIS — D126 Benign neoplasm of colon, unspecified: Secondary | ICD-10-CM

## 2013-07-18 DIAGNOSIS — E785 Hyperlipidemia, unspecified: Secondary | ICD-10-CM

## 2013-07-18 DIAGNOSIS — E559 Vitamin D deficiency, unspecified: Secondary | ICD-10-CM

## 2013-07-18 DIAGNOSIS — E039 Hypothyroidism, unspecified: Secondary | ICD-10-CM

## 2013-07-18 DIAGNOSIS — I10 Essential (primary) hypertension: Secondary | ICD-10-CM

## 2013-07-18 LAB — HEPATIC FUNCTION PANEL
ALT: 18 U/L (ref 0–35)
AST: 25 U/L (ref 0–37)
Albumin: 3.9 g/dL (ref 3.5–5.2)
Alkaline Phosphatase: 33 U/L — ABNORMAL LOW (ref 39–117)
Bilirubin, Direct: 0.1 mg/dL (ref 0.0–0.3)
Total Bilirubin: 0.6 mg/dL (ref 0.3–1.2)
Total Protein: 6.9 g/dL (ref 6.0–8.3)

## 2013-07-18 LAB — CBC WITH DIFFERENTIAL/PLATELET
BASOS PCT: 0.7 % (ref 0.0–3.0)
Basophils Absolute: 0 10*3/uL (ref 0.0–0.1)
EOS PCT: 4.9 % (ref 0.0–5.0)
Eosinophils Absolute: 0.2 10*3/uL (ref 0.0–0.7)
HEMATOCRIT: 38.7 % (ref 36.0–46.0)
HEMOGLOBIN: 12.6 g/dL (ref 12.0–15.0)
LYMPHS ABS: 2.2 10*3/uL (ref 0.7–4.0)
Lymphocytes Relative: 44.9 % (ref 12.0–46.0)
MCHC: 32.7 g/dL (ref 30.0–36.0)
MCV: 94.4 fl (ref 78.0–100.0)
MONO ABS: 0.5 10*3/uL (ref 0.1–1.0)
MONOS PCT: 9.6 % (ref 3.0–12.0)
NEUTROS ABS: 2 10*3/uL (ref 1.4–7.7)
Neutrophils Relative %: 39.9 % — ABNORMAL LOW (ref 43.0–77.0)
Platelets: 217 10*3/uL (ref 150.0–400.0)
RBC: 4.09 Mil/uL (ref 3.87–5.11)
RDW: 15.5 % — ABNORMAL HIGH (ref 11.5–14.6)
WBC: 4.9 10*3/uL (ref 4.5–10.5)

## 2013-07-18 LAB — BASIC METABOLIC PANEL
BUN: 23 mg/dL (ref 6–23)
CO2: 28 mEq/L (ref 19–32)
CREATININE: 1.4 mg/dL — AB (ref 0.4–1.2)
Calcium: 9.6 mg/dL (ref 8.4–10.5)
Chloride: 107 mEq/L (ref 96–112)
GFR: 39.57 mL/min — ABNORMAL LOW (ref >60.00)
Glucose, Bld: 91 mg/dL (ref 70–99)
POTASSIUM: 5.1 meq/L (ref 3.5–5.1)
Sodium: 141 mEq/L (ref 135–145)

## 2013-07-18 LAB — LIPID PANEL
CHOL/HDL RATIO: 5
Cholesterol: 173 mg/dL (ref 0–200)
HDL: 38 mg/dL — ABNORMAL LOW (ref >39.00)
LDL Cholesterol: 118 mg/dL — ABNORMAL HIGH (ref 0–99)
TRIGLYCERIDES: 87 mg/dL (ref 0.0–149.0)
VLDL: 17.4 mg/dL (ref 0.0–40.0)

## 2013-07-18 LAB — TSH: TSH: 4.84 u[IU]/mL (ref 0.35–5.50)

## 2013-07-19 ENCOUNTER — Other Ambulatory Visit: Payer: Self-pay | Admitting: Pulmonary Disease

## 2013-07-19 LAB — VITAMIN D 25 HYDROXY (VIT D DEFICIENCY, FRACTURES): Vit D, 25-Hydroxy: 22 ng/mL — ABNORMAL LOW (ref 30–89)

## 2013-07-19 MED ORDER — VITAMIN D (ERGOCALCIFEROL) 1.25 MG (50000 UNIT) PO CAPS: 50000.0000 [IU] | ORAL_CAPSULE | ORAL | Status: DC

## 2013-09-19 ENCOUNTER — Other Ambulatory Visit: Payer: Self-pay | Admitting: Pulmonary Disease

## 2013-11-04 ENCOUNTER — Other Ambulatory Visit: Payer: Self-pay | Admitting: Pulmonary Disease

## 2013-11-13 ENCOUNTER — Other Ambulatory Visit: Payer: Self-pay | Admitting: Pulmonary Disease

## 2013-12-21 ENCOUNTER — Telehealth: Payer: Self-pay | Admitting: Pulmonary Disease

## 2013-12-21 DIAGNOSIS — L259 Unspecified contact dermatitis, unspecified cause: Secondary | ICD-10-CM

## 2013-12-21 NOTE — Telephone Encounter (Signed)
Called spoke with pt. She wants a referral to Dr. Allyson Sabal. She reports she has a skin problem on ear/head. Please advise SN thanks  Pt will see new PCP 01/22/14

## 2013-12-21 NOTE — Telephone Encounter (Signed)
Ok to send the referral to derm per pts request. thanks

## 2013-12-21 NOTE — Telephone Encounter (Signed)
Pt aware referral placed. Nothing further needed

## 2013-12-24 ENCOUNTER — Telehealth: Payer: Self-pay | Admitting: Pulmonary Disease

## 2013-12-24 MED ORDER — AZITHROMYCIN 250 MG PO TABS: ORAL_TABLET | ORAL | Status: DC

## 2013-12-24 NOTE — Telephone Encounter (Signed)
Pt is calling to check on status.  Pt is concerned that she hasn't heard anything from SN yet.  Deanna Olson

## 2013-12-24 NOTE — Telephone Encounter (Signed)
Okay to call in [facility] per SN  Pt aware nothing further needed

## 2013-12-24 NOTE — Telephone Encounter (Signed)
Spoke with pt. C/o sore throat (gargling warm salt water/listerine), deep prod cough w/ thick clear phlem (nasty taste), chills, ear lob swollen. Using cough drops, alka seltzer tab. Pt requests [facility] Please advise SN thanks  Allergies  Allergen Reactions  . Sulfonamide Derivatives     REACTION: angioedema     Current Outpatient Prescriptions on File Prior to Visit  Medication Sig Dispense Refill  . ALPRAZolam (XANAX) 0.25 MG tablet TAKE 1 TABLET BY MOUTH 3 TIMES A DAY AS NEEDED  90 tablet  2  . clobetasol (TEMOVATE) 0.05 % external solution Use as directed for scale itch      . desonide (DESOWEN) 0.05 % cream Apply 1 application topically 2 (two) times daily.      . fenofibrate 160 MG tablet TAKE 1 TABLET EVERY DAY  90 tablet  3  . furosemide (LASIX) 40 MG tablet Take 1/2 tablet daily  30 tablet  5  . ketoconazole (NIZORAL) 2 % shampoo Apply as directed      . levothyroxine (SYNTHROID, LEVOTHROID) 50 MCG tablet TAKE 1 TABLET EVERY DAY  90 tablet  1  . levothyroxine (SYNTHROID, LEVOTHROID) 50 MCG tablet TAKE 1 TABLET EVERY DAY  90 tablet  1  . lisinopril (PRINIVIL,ZESTRIL) 20 MG tablet TAKE 1 TABLET EVERY DAY  90 tablet  1  . omeprazole (PRILOSEC) 20 MG capsule TAKE ONE CAPSULE BY MOUTH EVERY MORNING  90 capsule  3  . simvastatin (ZOCOR) 40 MG tablet TAKE 1 TABLET AT BEDTIME  90 tablet  1  . vitamin C (ASCORBIC ACID) 500 MG tablet Take 500 mg by mouth daily.      . Vitamin D, Ergocalciferol, (DRISDOL) 50000 UNITS CAPS capsule Take 1 capsule (50,000 Units total) by mouth every 7 (seven) days.  12 capsule  3  . Vitamin E 400 UNITS TABS Take 1 tablet by mouth daily.       No current facility-administered medications on file prior to visit.

## 2013-12-29 ENCOUNTER — Other Ambulatory Visit: Payer: Self-pay | Admitting: Pulmonary Disease

## 2014-01-22 ENCOUNTER — Encounter: Payer: Self-pay | Admitting: Internal Medicine

## 2014-01-22 ENCOUNTER — Ambulatory Visit (INDEPENDENT_AMBULATORY_CARE_PROVIDER_SITE_OTHER): Payer: Commercial Managed Care - HMO | Admitting: Internal Medicine

## 2014-01-22 VITALS — BP 143/71 | HR 64 | Temp 97.8°F | Ht 65.0 in | Wt 169.2 lb

## 2014-01-22 DIAGNOSIS — R05 Cough: Secondary | ICD-10-CM

## 2014-01-22 DIAGNOSIS — E039 Hypothyroidism, unspecified: Secondary | ICD-10-CM

## 2014-01-22 DIAGNOSIS — K219 Gastro-esophageal reflux disease without esophagitis: Secondary | ICD-10-CM

## 2014-01-22 DIAGNOSIS — I10 Essential (primary) hypertension: Secondary | ICD-10-CM

## 2014-01-22 DIAGNOSIS — R059 Cough, unspecified: Secondary | ICD-10-CM

## 2014-01-22 DIAGNOSIS — E785 Hyperlipidemia, unspecified: Secondary | ICD-10-CM

## 2014-01-22 MED ORDER — BENZONATATE 100 MG PO CAPS: 100.0000 mg | ORAL_CAPSULE | Freq: Two times a day (BID) | ORAL | Status: DC | PRN

## 2014-01-22 NOTE — Progress Notes (Signed)
Subjective:    Patient ID: Deanna Olson, female    DOB: 12-08-1925, 80 y.o.   MRN: [id number]  HPI  Patient is here for to [facility] care with new PCP as Lenna Gilford is retiring We reviewed chronic medical issues and interval medical events today  Past Medical History  Diagnosis Date  . Allergic rhinitis, cause unspecified   . Hypertension   . Aortic valve disorders   . Unspecified venous (peripheral) insufficiency   . Hyperlipidemia   . Hypothyroid   . Esophageal reflux   . Osteoarthrosis, unspecified whether generalized or localized, unspecified site   . Anxiety state, unspecified   . Herpes zoster without mention of complication   . Eczema    Family History  Problem Relation Age of Onset  . Stroke Mother 4  . Diabetes Mother   . Diabetes Brother   . Heart attack Brother 61    s/p open heart surg  . CAD Brother 76  . Cancer Sister     4 types   History  Substance Use Topics  . Smoking status: Never Smoker   . Smokeless tobacco: Not on file  . Alcohol Use: Not on file  widowed late 2914 after >64y marriage - lives alone, indep - supportive dtr x 3 in town  Review of Systems  Constitutional: Negative for fever, fatigue and unexpected weight change.  Respiratory: Positive for cough. Negative for shortness of breath and wheezing. Choking: dry x 1-2d.   Cardiovascular: Positive for leg swelling (chronic mild L>R ankle/dep edema). Negative for chest pain and palpitations.  Gastrointestinal: Negative for nausea, abdominal pain and diarrhea.  Neurological: Negative for dizziness, weakness, light-headedness and headaches.  Psychiatric/Behavioral: Negative for dysphoric mood. The patient is not nervous/anxious.   All other systems reviewed and are negative.      Objective:   Physical Exam  BP 143/71  Pulse 64  Temp(Src) 97.8 F (36.6 C) (Oral)  Ht 5\' 5"  (1.651 m)  Wt 169 lb 4 oz (76.771 kg)  BMI 28.16 kg/m2  SpO2 96% Wt Readings from Last 3 Encounters:   01/22/14 169 lb 4 oz (76.771 kg)  07/17/13 172 lb 12.8 oz (78.382 kg)  01/10/13 170 lb 12.8 oz (77.474 kg)   Constitutional: She appears spry and fit, younger than stated age -well-developed and well-nourished. No distress. Dtr LouAnn at side  Neck: Normal range of motion. Neck supple. No JVD present. No thyromegaly present.  Cardiovascular: Normal rate, regular rhythm and normal heart sounds.  No murmur heard. mild L>R dep BLE edema. Pulmonary/Chest: Effort normal and breath sounds normal. No respiratory distress. She has no wheezes.  Psychiatric: She has a normal mood and affect. Her behavior is normal. Judgment and thought content normal.   Lab Results  Component Value Date   WBC 4.9 07/18/2013   HGB 12.6 07/18/2013   HCT 38.7 07/18/2013   PLT 217.0 07/18/2013   GLUCOSE 91 07/18/2013   CHOL 173 07/18/2013   TRIG 87.0 07/18/2013   HDL 38.00* 07/18/2013   LDLDIRECT 99.7 07/21/2006   LDLCALC 118* 07/18/2013   ALT 18 07/18/2013   AST 25 07/18/2013   NA 141 07/18/2013   K 5.1 07/18/2013   CL 107 07/18/2013   CREATININE 1.4* 07/18/2013   BUN 23 07/18/2013   CO2 28 07/18/2013   TSH 4.84 07/18/2013   HGBA1C 5.4 07/04/2006    Dg Chest 2 View  07/13/2012   *RADIOLOGY REPORT*  Clinical Data: Hypertension.  CHEST - 2 VIEW  Comparison: 09/14/2006  Findings: Mild hyperinflation of the lungs.  Heart is borderline in size.  Tortuosity of the thoracic aorta.  No focal opacities or effusions.  No acute bony abnormality.  IMPRESSION: Hyperinflation.  No acute findings.   Original Report Authenticated By: Rolm Baptise, M.D.       Assessment & Plan:   Dry cough - no evidence for infection on hx or exam - ?PND from allergic rhinitis - continue tx for allergic rhinitis and GERD as ongoing - add tessalon prn and call if worse or unimproved - explained lack of efficacy for antibiotics in absence of infectious process  Problem List Items Addressed This Visit   GERD     Infrequent breakthrough symptoms on daily therapy The current  medical regimen is effective;  continue present plan and medications.     HYPERLIPIDEMIA     On statin with fenofibrate Last lipids reviewed - check annually and titrate as needed The current medical regimen is effective;  continue present plan and medications.     Relevant Medications      furosemide (LASIX) tablet   HYPERTENSION      BP Readings from Last 3 Encounters:  01/22/14 143/71  07/17/13 132/60  01/10/13 138/70   The current medical regimen is reasonably effective;  continue present plan and medications.     Relevant Medications      furosemide (LASIX) tablet   HYPOTHYROIDISM      Lab Results  Component Value Date   TSH 4.84 07/18/2013   Check TSH annually and adjust as needed The current medical regimen is effective;  continue present plan and medications.      Other Visit Diagnoses   Cough    -  Primary      Time spent with pt/family today 40 minutes, greater than 50% time spent counseling patient on dry cough, chronic conditions and medication review. Also review of prior records

## 2014-01-22 NOTE — Patient Instructions (Signed)
It was good to see you today.  We have reviewed your prior records including labs and tests today  Medications reviewed and updated, tessalon as needed for cough - Your prescription(s) have been submitted to your pharmacy. Please take as directed and contact our office if you believe you are having problem(s) with the medication(s). No other changes recommended at this time.  Please schedule followup in 6 months for annual exam and labs, call sooner if problems.

## 2014-01-22 NOTE — Progress Notes (Signed)
Pre visit review using our clinic review tool, if applicable. No additional management support is needed unless otherwise documented below in the visit note. 

## 2014-01-23 ENCOUNTER — Telehealth: Payer: Self-pay | Admitting: Internal Medicine

## 2014-01-23 NOTE — Assessment & Plan Note (Signed)
On statin with fenofibrate Last lipids reviewed - check annually and titrate as needed The current medical regimen is effective;  continue present plan and medications.

## 2014-01-23 NOTE — Assessment & Plan Note (Signed)
Lab Results  Component Value Date   TSH 4.84 07/18/2013   Check TSH annually and adjust as needed The current medical regimen is effective;  continue present plan and medications.

## 2014-01-23 NOTE — Assessment & Plan Note (Signed)
BP Readings from Last 3 Encounters:  01/22/14 143/71  07/17/13 132/60  01/10/13 138/70   The current medical regimen is reasonably effective;  continue present plan and medications.

## 2014-01-23 NOTE — Telephone Encounter (Signed)
Relevant patient education assigned to patient using Emmi. ° °

## 2014-01-23 NOTE — Assessment & Plan Note (Signed)
Infrequent breakthrough symptoms on daily therapy The current medical regimen is effective;  continue present plan and medications.

## 2014-03-10 ENCOUNTER — Other Ambulatory Visit: Payer: Self-pay | Admitting: Pulmonary Disease

## 2014-03-12 ENCOUNTER — Other Ambulatory Visit: Payer: Self-pay

## 2014-03-12 MED ORDER — FENOFIBRATE 160 MG PO TABS: ORAL_TABLET | ORAL | Status: DC

## 2014-03-13 ENCOUNTER — Telehealth: Payer: Self-pay | Admitting: Pulmonary Disease

## 2014-03-13 NOTE — Telephone Encounter (Signed)
Spoke with the pt  She is asking if she had pneumonia vaccine last ov with SN  I advised that she did have this vaccine on that day  She verbalized understanding  Nothing further needed

## 2014-03-15 ENCOUNTER — Other Ambulatory Visit: Payer: Self-pay | Admitting: Pulmonary Disease

## 2014-03-15 ENCOUNTER — Other Ambulatory Visit: Payer: Self-pay | Admitting: *Deleted

## 2014-03-15 MED ORDER — OMEPRAZOLE 20 MG PO CPDR: DELAYED_RELEASE_CAPSULE | ORAL | Status: DC

## 2014-03-16 ENCOUNTER — Other Ambulatory Visit: Payer: Self-pay | Admitting: Pulmonary Disease

## 2014-03-19 ENCOUNTER — Other Ambulatory Visit: Payer: Self-pay

## 2014-03-19 MED ORDER — LEVOTHYROXINE SODIUM 50 MCG PO TABS: ORAL_TABLET | ORAL | Status: DC

## 2014-03-22 ENCOUNTER — Ambulatory Visit (INDEPENDENT_AMBULATORY_CARE_PROVIDER_SITE_OTHER): Payer: Commercial Managed Care - HMO

## 2014-03-22 DIAGNOSIS — Z23 Encounter for immunization: Secondary | ICD-10-CM

## 2014-04-05 ENCOUNTER — Other Ambulatory Visit: Payer: Self-pay | Admitting: Pulmonary Disease

## 2014-04-09 ENCOUNTER — Other Ambulatory Visit: Payer: Self-pay | Admitting: Internal Medicine

## 2014-04-09 ENCOUNTER — Telehealth: Payer: Self-pay | Admitting: Internal Medicine

## 2014-04-09 NOTE — Telephone Encounter (Signed)
First refill was sent to Dr. Lenna Gilford office and they denied script. Received Electronic request today 04/09/14 for Dr. Asa Lente and her assistant has already refill med...Deanna Olson

## 2014-04-09 NOTE — Telephone Encounter (Signed)
Aislin/patient  [phone] called to ask why refill for Lisinopril 20 mg requested by [facility] [place] 04/05/14 has not yet been refilled.  Is now out of the medication 04/09/14.  Last office visit 01/22/14 with Dr Asa Lente.  Per EMR, no refill yet document.  Rx was last ordered by Dr Lenna Gilford, pulmonologist.  Explained Dr Asa Lente is out of the office until 04/15/14 so refill request will be reviewed by another [facility] provider.  Also informed, pharmacy can give her an emergency supply so will not have to stop taking the medication.  Office, please follow up with this refill request ASAP.

## 2014-05-02 ENCOUNTER — Telehealth: Payer: Self-pay | Admitting: Internal Medicine

## 2014-05-02 ENCOUNTER — Encounter: Payer: Self-pay | Admitting: Family

## 2014-05-02 ENCOUNTER — Ambulatory Visit (INDEPENDENT_AMBULATORY_CARE_PROVIDER_SITE_OTHER): Payer: Commercial Managed Care - HMO | Admitting: Family

## 2014-05-02 VITALS — BP 150/84 | HR 61 | Temp 98.3°F | Resp 18 | Ht 64.0 in | Wt 168.8 lb

## 2014-05-02 DIAGNOSIS — J069 Acute upper respiratory infection, unspecified: Secondary | ICD-10-CM

## 2014-05-02 MED ORDER — AZITHROMYCIN 250 MG PO TABS: ORAL_TABLET | ORAL | Status: DC

## 2014-05-02 NOTE — Assessment & Plan Note (Addendum)
Symptoms and exam consistent with bacterial URI. Start Azithromycin. Continue OTC medications as needed for symptom relief. Recommend Delsym for cough. Continue Xanax as needed for sleep and Tessalon pearls as needed for cough. Follow up if symptoms worsen or fail to improve.

## 2014-05-02 NOTE — Progress Notes (Signed)
Subjective:    Patient ID: Deanna Olson, female    DOB: 10-13-1925, 80 y.o.   MRN: [id number]  Chief Complaint  Patient presents with  . Cough    cough, sore throat, congestion, x2 days    HPI:  Shawnay Olson is a 80 y.o. female who presents today for an acute visit.   Acute symptoms started about 2 days ago. Currently experiencing a cough (with yellow mucus production), sore throat, and congestion. Denies any fevers at this time. Does not usually run a fever with any illness. Has been taking Tessalon pearls for her cough. Has been using cough drops and garggling with water. Denies anything that makes it better or worse.   Allergies  Allergen Reactions  . Sulfonamide Derivatives     REACTION: angioedema   Current Outpatient Prescriptions on File Prior to Visit  Medication Sig Dispense Refill  . ALPRAZolam (XANAX) 0.25 MG tablet TAKE 1 TABLET BY MOUTH 3 TIMES A DAY AS NEEDED 90 tablet 2  . benzonatate (TESSALON) 100 MG capsule Take 1 capsule (100 mg total) by mouth 2 (two) times daily as needed for cough. 20 capsule 0  . clobetasol (TEMOVATE) 0.05 % external solution Use as directed for scale itch    . desonide (DESOWEN) 0.05 % cream Apply 1 application topically 2 (two) times daily.    . fenofibrate 160 MG tablet TAKE 1 TABLET EVERY DAY 90 tablet 3  . furosemide (LASIX) 40 MG tablet Take 0.5 tablets (20 mg total) by mouth as needed. 30 tablet 5  . ketoconazole (NIZORAL) 2 % shampoo Apply as directed    . levothyroxine (SYNTHROID, LEVOTHROID) 50 MCG tablet TAKE 1 TABLET EVERY DAY 90 tablet 1  . lisinopril (PRINIVIL,ZESTRIL) 20 MG tablet TAKE 1 TABLET BY MOUTH EVERY DAY 90 tablet 0  . omeprazole (PRILOSEC) 20 MG capsule TAKE ONE CAPSULE BY MOUTH EVERY MORNING 90 capsule 3  . simvastatin (ZOCOR) 40 MG tablet TAKE 1 TABLET AT BEDTIME 90 tablet 1  . vitamin C (ASCORBIC ACID) 500 MG tablet Take 500 mg by mouth daily.    . Vitamin D, Ergocalciferol, (DRISDOL) 50000 UNITS CAPS capsule  Take 1 capsule (50,000 Units total) by mouth every 7 (seven) days. 12 capsule 3  . Vitamin E 400 UNITS TABS Take 1 tablet by mouth daily.     No current facility-administered medications on file prior to visit.    Review of Systems  See HPI   Objective:    BP 150/84 mmHg  Pulse 61  Temp(Src) 98.3 F (36.8 C) (Oral)  Resp 18  Ht 5\' 4"  (1.626 m)  Wt 168 lb 12.8 oz (76.567 kg)  BMI 28.96 kg/m2  SpO2 96% Nursing note and vital signs reviewed.  Physical Exam  Constitutional: She is oriented to person, place, and time. She appears well-developed and well-nourished. No distress.  HENT:  Right Ear: Hearing, tympanic membrane, external ear and ear canal normal.  Left Ear: Hearing, tympanic membrane, external ear and ear canal normal.  Nose: Nose normal. Right sinus exhibits no maxillary sinus tenderness and no frontal sinus tenderness. Left sinus exhibits no maxillary sinus tenderness and no frontal sinus tenderness.  Mouth/Throat: Uvula is midline and mucous membranes are normal. Posterior oropharyngeal erythema present.  Increased cerumen noted in right ear.   Neck: Neck supple.  Cardiovascular: Normal rate, regular rhythm, normal heart sounds and intact distal pulses.   Pulmonary/Chest: Effort normal and breath sounds normal.  Lymphadenopathy:    She has cervical adenopathy.  Neurological: She is alert and oriented to person, place, and time.  Skin: Skin is warm and dry.  Psychiatric: She has a normal mood and affect. Her behavior is normal. Judgment and thought content normal.       Assessment & Plan:

## 2014-05-02 NOTE — Progress Notes (Signed)
Pre visit review using our clinic review tool, if applicable. No additional management support is needed unless otherwise documented below in the visit note. 

## 2014-05-02 NOTE — Patient Instructions (Addendum)
Thank you for choosing [facility].  Summary/Instructions:  Your prescription(s) have been submitted to your pharmacy. Please take as directed and contact our office if you believe you are having problem(s) with the medication(s).  If your symptoms worsen or fail to improve, please contact our office for further instruction, or in case of emergency go directly to the emergency room at the closest medical facility.   Take Delsym as needed for cough and an antihistamine such as Zyrtec, Allegra, and Claritin for congestion.  For your ears - use the murine drops that come in the ear cleaning.    Upper Respiratory Infection, Adult An upper respiratory infection (URI) is also sometimes known as the common cold. The upper respiratory tract includes the nose, sinuses, throat, trachea, and bronchi. Bronchi are the airways leading to the lungs. Most people improve within 1 week, but symptoms can last up to 2 weeks. A residual cough may last even longer.  CAUSES Many different viruses can infect the tissues lining the upper respiratory tract. The tissues become irritated and inflamed and often become very moist. Mucus production is also common. A cold is contagious. You can easily spread the virus to others by oral contact. This includes kissing, sharing a glass, coughing, or sneezing. Touching your mouth or nose and then touching a surface, which is then touched by another person, can also spread the virus. SYMPTOMS  Symptoms typically develop 1 to 3 days after you come in contact with a cold virus. Symptoms vary from person to person. They may include:  Runny nose.  Sneezing.  Nasal congestion.  Sinus irritation.  Sore throat.  Loss of voice (laryngitis).  Cough.  Fatigue.  Muscle aches.  Loss of appetite.  Headache.  Low-grade fever. DIAGNOSIS  You might diagnose your own cold based on familiar symptoms, since most people get a cold 2 to 3 times a year. Your caregiver can  confirm this based on your exam. Most importantly, your caregiver can check that your symptoms are not due to another disease such as strep throat, sinusitis, pneumonia, asthma, or epiglottitis. Blood tests, throat tests, and X-rays are not necessary to diagnose a common cold, but they may sometimes be helpful in excluding other more serious diseases. Your caregiver will decide if any further tests are required. RISKS AND COMPLICATIONS  You may be at risk for a more severe case of the common cold if you smoke cigarettes, have chronic heart disease (such as heart failure) or lung disease (such as asthma), or if you have a weakened immune system. The very young and very old are also at risk for more serious infections. Bacterial sinusitis, middle ear infections, and bacterial pneumonia can complicate the common cold. The common cold can worsen asthma and chronic obstructive pulmonary disease (COPD). Sometimes, these complications can require emergency medical care and may be life-threatening. PREVENTION  The best way to protect against getting a cold is to practice good hygiene. Avoid oral or hand contact with people with cold symptoms. Wash your hands often if contact occurs. There is no clear evidence that vitamin C, vitamin E, echinacea, or exercise reduces the chance of developing a cold. However, it is always recommended to get plenty of rest and practice good nutrition. TREATMENT  Treatment is directed at relieving symptoms. There is no cure. Antibiotics are not effective, because the infection is caused by a virus, not by bacteria. Treatment may include:  Increased fluid intake. Sports drinks offer valuable electrolytes, sugars, and fluids.  Breathing  heated mist or steam (vaporizer or shower).  Eating chicken soup or other clear broths, and maintaining good nutrition.  Getting plenty of rest.  Using gargles or lozenges for comfort.  Controlling fevers with ibuprofen or acetaminophen as  directed by your caregiver.  Increasing usage of your inhaler if you have asthma. Zinc gel and zinc lozenges, taken in the first 24 hours of the common cold, can shorten the duration and lessen the severity of symptoms. Pain medicines may help with fever, muscle aches, and throat pain. A variety of non-prescription medicines are available to treat congestion and runny nose. Your caregiver can make recommendations and may suggest nasal or lung inhalers for other symptoms.  HOME CARE INSTRUCTIONS   Only take over-the-counter or prescription medicines for pain, discomfort, or fever as directed by your caregiver.  Use a warm mist humidifier or inhale steam from a shower to increase air moisture. This may keep secretions moist and make it easier to breathe.  Drink enough water and fluids to keep your urine clear or pale yellow.  Rest as needed.  Return to work when your temperature has returned to normal or as your caregiver advises. You may need to stay home longer to avoid infecting others. You can also use a face mask and careful hand washing to prevent spread of the virus. SEEK MEDICAL CARE IF:   After the first few days, you feel you are getting worse rather than better.  You need your caregiver's advice about medicines to control symptoms.  You develop chills, worsening shortness of breath, or brown or red sputum. These may be signs of pneumonia.  You develop yellow or brown nasal discharge or pain in the face, especially when you bend forward. These may be signs of sinusitis.  You develop a fever, swollen neck glands, pain with swallowing, or white areas in the back of your throat. These may be signs of strep throat. SEEK IMMEDIATE MEDICAL CARE IF:   You have a fever.  You develop severe or persistent headache, ear pain, sinus pain, or chest pain.  You develop wheezing, a prolonged cough, cough up blood, or have a change in your usual mucus (if you have chronic lung disease).  You  develop sore muscles or a stiff neck. Document Released: 11/24/2000 Document Revised: 08/23/2011 Document Reviewed: 09/05/2013 [facility] Patient Information 2015 [place], [place]. This information is not intended to replace advice given to you by your health care provider. Make sure you discuss any questions you have with your health care provider.

## 2014-05-02 NOTE — Telephone Encounter (Signed)
Patient is having cough, sore throat, chills and congestion.  She states Dr. Asa Lente called her in a zpak earlier this year.  She is requesting Dr. Asa Lente to do the same this time.

## 2014-05-02 NOTE — Telephone Encounter (Signed)
Best medical practice recommends office visit for evaluation of this problem. Any provider in the office will suffice to see if antibiotics are warranted Thank you

## 2014-05-02 NOTE — Telephone Encounter (Signed)
Called pt and OV made for today.

## 2014-05-07 ENCOUNTER — Other Ambulatory Visit: Payer: Self-pay | Admitting: Pulmonary Disease

## 2014-06-05 ENCOUNTER — Other Ambulatory Visit: Payer: Self-pay | Admitting: Pulmonary Disease

## 2014-06-09 ENCOUNTER — Other Ambulatory Visit: Payer: Self-pay | Admitting: Pulmonary Disease

## 2014-06-10 ENCOUNTER — Telehealth: Payer: Self-pay | Admitting: Internal Medicine

## 2014-06-10 NOTE — Telephone Encounter (Signed)
Pt called in requesting refill on her Xanex . She requested 90 day supply

## 2014-06-10 NOTE — Telephone Encounter (Signed)
I will give her a 30 day supply. Dr. Asa Lente can increase refills or add when she returns.

## 2014-06-11 ENCOUNTER — Other Ambulatory Visit: Payer: Self-pay

## 2014-06-11 ENCOUNTER — Other Ambulatory Visit: Payer: Self-pay | Admitting: Pulmonary Disease

## 2014-06-11 ENCOUNTER — Other Ambulatory Visit: Payer: Self-pay | Admitting: Internal Medicine

## 2014-06-11 MED ORDER — ALPRAZOLAM 0.25 MG PO TABS: 0.2500 mg | ORAL_TABLET | Freq: Three times a day (TID) | ORAL | Status: DC | PRN

## 2014-06-11 NOTE — Telephone Encounter (Signed)
Called pt to let her know that her rx for xanax is ready for pick up.

## 2014-06-11 NOTE — Telephone Encounter (Signed)
MD out of office. Pls advise on refill.../lmb 

## 2014-06-12 ENCOUNTER — Other Ambulatory Visit: Payer: Self-pay | Admitting: Internal Medicine

## 2014-06-12 NOTE — Telephone Encounter (Signed)
MD out of office pls advise on refill.../lmb 

## 2014-06-12 NOTE — Telephone Encounter (Signed)
Xanax rx already done per [facility] NP

## 2014-07-03 ENCOUNTER — Other Ambulatory Visit: Payer: Self-pay | Admitting: Internal Medicine

## 2014-07-16 ENCOUNTER — Ambulatory Visit (INDEPENDENT_AMBULATORY_CARE_PROVIDER_SITE_OTHER): Payer: Commercial Managed Care - HMO | Admitting: Cardiology

## 2014-07-16 ENCOUNTER — Encounter: Payer: Self-pay | Admitting: Cardiology

## 2014-07-16 VITALS — BP 180/74 | HR 70 | Ht 64.0 in | Wt 169.0 lb

## 2014-07-16 DIAGNOSIS — R0989 Other specified symptoms and signs involving the circulatory and respiratory systems: Secondary | ICD-10-CM

## 2014-07-16 DIAGNOSIS — I359 Nonrheumatic aortic valve disorder, unspecified: Secondary | ICD-10-CM

## 2014-07-16 DIAGNOSIS — I10 Essential (primary) hypertension: Secondary | ICD-10-CM

## 2014-07-16 DIAGNOSIS — I350 Nonrheumatic aortic (valve) stenosis: Secondary | ICD-10-CM

## 2014-07-16 NOTE — Patient Instructions (Signed)
Your physician recommends that you schedule a follow-up appointment in: one year with Dr. Percival Spanish  We are ordering two test for you to get done

## 2014-07-16 NOTE — Progress Notes (Signed)
HPI The patient presents for follow-up of aortic stenosis. His was mild on echo in 2013. She returns for follow-up and says that she is doing relatively well. She does not feel lightheadedness, presyncope or syncope. She might get a little short of breath climbing a flight of stairs but she's not describing any resting shortness of breath. She's not having any PND or orthopnea. She's not having any palpitations, presyncope or syncope. She has no chest pressure, neck or arm discomfort. She has some mild lower extremity swelling but no weight gain.  Allergies  Allergen Reactions  . Sulfonamide Derivatives     REACTION: angioedema    Current Outpatient Prescriptions  Medication Sig Dispense Refill  . ALPRAZolam (XANAX) 0.25 MG tablet Take 1 tablet (0.25 mg total) by mouth 3 (three) times daily as needed. 90 tablet 0  . clobetasol (TEMOVATE) 0.05 % external solution Use as directed for scale itch    . desonide (DESOWEN) 0.05 % cream Apply 1 application topically 2 (two) times daily.    . fenofibrate 160 MG tablet TAKE 1 TABLET EVERY DAY 90 tablet 3  . furosemide (LASIX) 40 MG tablet Take 0.5 tablets (20 mg total) by mouth as needed. 30 tablet 5  . ketoconazole (NIZORAL) 2 % shampoo Apply as directed    . levothyroxine (SYNTHROID, LEVOTHROID) 50 MCG tablet TAKE 1 TABLET EVERY DAY 90 tablet 1  . lisinopril (PRINIVIL,ZESTRIL) 20 MG tablet Take 1 tablet (20 mg total) by mouth daily. 90 tablet 1  . omeprazole (PRILOSEC) 20 MG capsule TAKE ONE CAPSULE BY MOUTH EVERY MORNING 90 capsule 3  . simvastatin (ZOCOR) 40 MG tablet TAKE 1 TABLET AT BEDTIME 30 tablet 5  . vitamin C (ASCORBIC ACID) 500 MG tablet Take 500 mg by mouth daily.    . Vitamin D, Ergocalciferol, (DRISDOL) 50000 UNITS CAPS capsule Take 1 capsule (50,000 Units total) by mouth every 7 (seven) days. 12 capsule 3  . Vitamin E 400 UNITS TABS Take 1 tablet by mouth daily.     No current facility-administered medications for this visit.     Past Medical History  Diagnosis Date  . Allergic rhinitis, cause unspecified   . Hypertension   . Aortic valve disorders   . Unspecified venous (peripheral) insufficiency   . Hyperlipidemia   . Hypothyroid   . Esophageal reflux   . Osteoarthrosis, unspecified whether generalized or localized, unspecified site   . Anxiety state, unspecified   . Herpes zoster without mention of complication   . Eczema     Past Surgical History  Procedure Laterality Date  . Vesicovaginal fistula closure w/ tah    . Cataract extraction, bilateral  2013    ROS:  As stated in the HPI and negative for all other systems.  PHYSICAL EXAM BP 180/74 mmHg  Pulse 70  Ht 5\' 4"  (1.626 m)  Wt 169 lb (76.658 kg)  BMI 28.99 kg/m2 GENERAL:  Well appearing HEENT:  Pupils equal round and reactive, fundi not visualized, oral mucosa unremarkable NECK:  No jugular venous distention, waveform within normal limits, carotid upstroke brisk and symmetric, positive soft bilateral bruits versus transmitted systolic murmur , no thyromegaly LYMPHATICS:  No cervical, inguinal adenopathy LUNGS:  Clear to auscultation bilaterally BACK:  No CVA tenderness CHEST:  Unremarkable  HEART:  PMI not displaced or sustained,S1 and S2 within normal limits, no S3, no S4, no clicks, no rubs, 3 out of 6 mid peaking apical systolic murmur radiating out the aortic outflow tract,  no diastolic smurmurs ABD:  Flat, positive bowel sounds normal in frequency in pitch, no bruits, no rebound, no guarding, no midline pulsatile mass, no hepatomegaly, no splenomegaly EXT:  2 plus pulses throughout, no edema, no cyanosis no clubbing SKIN:  No rashes no nodules NEURO:  Cranial nerves II through XII grossly intact, motor grossly intact throughout PSYCH:  Cognitively intact, oriented to person place and time  EKG:  Sinus rhythm rate 70, axis within normal limits, left bundle branch block, no change from previous. 07/16/2014  ASSESSMENT AND  PLAN  LBBB:  This is chronic.  No further work is indicated.   HTN: Her blood pressure is significantly elevated today but I looked back and this is unusual. I talked to her daughter about keeping a blood pressure diary. She'll call me with results if they are elevated.  Aortic stenosis: This sounds like it has progressed. I will follow with an echocardiogram. She however isn't claiming any symptoms.  Bruit:  She will get carotid Dopplers.

## 2014-07-24 ENCOUNTER — Other Ambulatory Visit: Payer: Self-pay | Admitting: Family

## 2014-07-26 ENCOUNTER — Telehealth: Payer: Self-pay | Admitting: Internal Medicine

## 2014-07-26 ENCOUNTER — Other Ambulatory Visit: Payer: Self-pay | Admitting: Geriatric Medicine

## 2014-07-26 MED ORDER — ALPRAZOLAM 0.25 MG PO TABS: 0.2500 mg | ORAL_TABLET | Freq: Three times a day (TID) | ORAL | Status: DC | PRN

## 2014-07-26 NOTE — Telephone Encounter (Signed)
[facility].

## 2014-07-26 NOTE — Telephone Encounter (Signed)
Faxed

## 2014-07-30 ENCOUNTER — Ambulatory Visit (HOSPITAL_BASED_OUTPATIENT_CLINIC_OR_DEPARTMENT_OTHER)
Admission: RE | Admit: 2014-07-30 | Discharge: 2014-07-30 | Disposition: A | Discharge status: Home / Self Care | Payer: Commercial Managed Care - HMO | Source: Ambulatory Visit | Attending: Internal Medicine | Admitting: Internal Medicine

## 2014-07-30 ENCOUNTER — Ambulatory Visit (HOSPITAL_COMMUNITY)
Admission: RE | Admit: 2014-07-30 | Discharge: 2014-07-30 | Disposition: A | Discharge status: Home / Self Care | Payer: Commercial Managed Care - HMO | Source: Ambulatory Visit | Attending: Internal Medicine | Admitting: Internal Medicine

## 2014-07-30 DIAGNOSIS — E785 Hyperlipidemia, unspecified: Secondary | ICD-10-CM | POA: Insufficient documentation

## 2014-07-30 DIAGNOSIS — I359 Nonrheumatic aortic valve disorder, unspecified: Secondary | ICD-10-CM | POA: Diagnosis present

## 2014-07-30 DIAGNOSIS — I10 Essential (primary) hypertension: Secondary | ICD-10-CM | POA: Diagnosis not present

## 2014-07-30 DIAGNOSIS — I350 Nonrheumatic aortic (valve) stenosis: Secondary | ICD-10-CM

## 2014-07-30 DIAGNOSIS — R0989 Other specified symptoms and signs involving the circulatory and respiratory systems: Secondary | ICD-10-CM

## 2014-07-30 NOTE — Progress Notes (Signed)
Carotid Duplex Completed. Devon Pretty, BS, RDMS, RVT  

## 2014-07-30 NOTE — Progress Notes (Signed)
2D Echocardiogram Complete.  07/30/2014   Deliah Boston, RDCS   Preliminary Technician Findings:  There is a small, complex Pericardial Effusion located anterior to the heart.  The Aortic Stenosis is mild, as seen on prior echo (08-03-11).

## 2014-08-13 ENCOUNTER — Encounter: Payer: Self-pay | Admitting: Internal Medicine

## 2014-08-13 ENCOUNTER — Encounter: Payer: Commercial Managed Care - HMO | Admitting: Internal Medicine

## 2014-08-13 ENCOUNTER — Ambulatory Visit (INDEPENDENT_AMBULATORY_CARE_PROVIDER_SITE_OTHER): Payer: Commercial Managed Care - HMO | Admitting: Internal Medicine

## 2014-08-13 ENCOUNTER — Other Ambulatory Visit (INDEPENDENT_AMBULATORY_CARE_PROVIDER_SITE_OTHER): Payer: Commercial Managed Care - HMO

## 2014-08-13 VITALS — BP 136/62 | HR 62 | Temp 97.8°F | Resp 16 | Ht 64.0 in | Wt 169.4 lb

## 2014-08-13 DIAGNOSIS — E785 Hyperlipidemia, unspecified: Secondary | ICD-10-CM

## 2014-08-13 DIAGNOSIS — N1831 Chronic kidney disease, stage 3a: Secondary | ICD-10-CM | POA: Insufficient documentation

## 2014-08-13 DIAGNOSIS — I10 Essential (primary) hypertension: Secondary | ICD-10-CM

## 2014-08-13 DIAGNOSIS — N183 Chronic kidney disease, stage 3 unspecified: Secondary | ICD-10-CM | POA: Insufficient documentation

## 2014-08-13 DIAGNOSIS — E039 Hypothyroidism, unspecified: Secondary | ICD-10-CM

## 2014-08-13 DIAGNOSIS — Z0000 Encounter for general adult medical examination without abnormal findings: Secondary | ICD-10-CM

## 2014-08-13 DIAGNOSIS — F411 Generalized anxiety disorder: Secondary | ICD-10-CM

## 2014-08-13 DIAGNOSIS — J3089 Other allergic rhinitis: Secondary | ICD-10-CM

## 2014-08-13 LAB — LIPID PANEL
CHOLESTEROL: 196 mg/dL (ref 0–200)
HDL: 39 mg/dL — AB (ref >39.00)
LDL CALC: 125 mg/dL — AB (ref 0–99)
NonHDL: 157
TRIGLYCERIDES: 162 mg/dL — AB (ref 0.0–149.0)
Total CHOL/HDL Ratio: 5
VLDL: 32.4 mg/dL (ref 0.0–40.0)

## 2014-08-13 LAB — COMPREHENSIVE METABOLIC PANEL
ALBUMIN: 4.7 g/dL (ref 3.5–5.2)
ALT: 22 U/L (ref 0–35)
AST: 35 U/L (ref 0–37)
Alkaline Phosphatase: 37 U/L — ABNORMAL LOW (ref 39–117)
BUN: 30 mg/dL — ABNORMAL HIGH (ref 6–23)
CALCIUM: 10.4 mg/dL (ref 8.4–10.5)
CHLORIDE: 104 meq/L (ref 96–112)
CO2: 28 meq/L (ref 19–32)
CREATININE: 1.39 mg/dL — AB (ref 0.40–1.20)
GFR: 38.16 mL/min — AB (ref >60.00)
Glucose, Bld: 86 mg/dL (ref 70–99)
POTASSIUM: 5.1 meq/L (ref 3.5–5.1)
Sodium: 139 mEq/L (ref 135–145)
Total Bilirubin: 0.5 mg/dL (ref 0.2–1.2)
Total Protein: 8.2 g/dL (ref 6.0–8.3)

## 2014-08-13 LAB — CBC
HEMATOCRIT: 40.5 % (ref 36.0–46.0)
Hemoglobin: 13.4 g/dL (ref 12.0–15.0)
MCHC: 33 g/dL (ref 30.0–36.0)
MCV: 91.3 fl (ref 78.0–100.0)
PLATELETS: 250 10*3/uL (ref 150.0–400.0)
RBC: 4.44 Mil/uL (ref 3.87–5.11)
RDW: 15.8 % — ABNORMAL HIGH (ref 11.5–15.5)
WBC: 6.6 10*3/uL (ref 4.0–10.5)

## 2014-08-13 LAB — TSH: TSH: 3.52 u[IU]/mL (ref 0.35–4.50)

## 2014-08-13 MED ORDER — FLUTICASONE PROPIONATE 50 MCG/ACT NA SUSP: 2.0000 | Freq: Every day | NASAL | Status: DC

## 2014-08-13 NOTE — Progress Notes (Signed)
Pre visit review using our clinic review tool, if applicable. No additional management support is needed unless otherwise documented below in the visit note. 

## 2014-08-13 NOTE — Assessment & Plan Note (Signed)
BP well controlled on lisinopril 20 mg daily. Not taking lasix and will remove from list so as not to confuse. Check BMP and adjust as needed.

## 2014-08-13 NOTE — Patient Instructions (Addendum)
We will check the blood work today and call you back with the results.   We have sent in flonase which is a steroid that you spray in the nose. Use 2 puffs in each nostril once a day.   We would recommend that you stop taking the affrin after about 1 week as it can make your body dependent on it and become blocked when you do not take it.   Also you can use saline nose spray as often as you want to rinse the sinuses and keep them from getting dry.   Come back in about 6-12 months to see your doctor. If you have any problems or questions before then please feel free to call the office.   Work on exercising by walking about 3 times per week.   Fall Prevention and Home Safety Falls cause injuries and can affect all age groups. It is possible to use preventive measures to significantly decrease the likelihood of falls. There are many simple measures which can make your home safer and prevent falls. OUTDOORS  Repair cracks and edges of walkways and driveways.  Remove high doorway thresholds.  Trim shrubbery on the main path into your home.  Have good outside lighting.  Clear walkways of tools, rocks, debris, and clutter.  Check that handrails are not broken and are securely fastened. Both sides of steps should have handrails.  Have leaves, snow, and ice cleared regularly.  Use sand or salt on walkways during winter months.  In the garage, clean up grease or oil spills. BATHROOM  Install night lights.  Install grab bars by the toilet and in the tub and shower.  Use non-skid mats or decals in the tub or shower.  Place a plastic non-slip stool in the shower to sit on, if needed.  Keep floors dry and clean up all water on the floor immediately.  Remove soap buildup in the tub or shower on a regular basis.  Secure bath mats with non-slip, double-sided rug tape.  Remove throw rugs and tripping hazards from the floors. BEDROOMS  Install night lights.  Make sure a bedside  light is easy to reach.  Do not use oversized bedding.  Keep a telephone by your bedside.  Have a firm chair with side arms to use for getting dressed.  Remove throw rugs and tripping hazards from the floor. KITCHEN  Keep handles on pots and pans turned toward the center of the stove. Use back burners when possible.  Clean up spills quickly and allow time for drying.  Avoid walking on wet floors.  Avoid hot utensils and knives.  Position shelves so they are not too high or low.  Place commonly used objects within easy reach.  If necessary, use a sturdy step stool with a grab bar when reaching.  Keep electrical cables out of the way.  Do not use floor polish or wax that makes floors slippery. If you must use wax, use non-skid floor wax.  Remove throw rugs and tripping hazards from the floor. STAIRWAYS  Never leave objects on stairs.  Place handrails on both sides of stairways and use them. Fix any loose handrails. Make sure handrails on both sides of the stairways are as long as the stairs.  Check carpeting to make sure it is firmly attached along stairs. Make repairs to worn or loose carpet promptly.  Avoid placing throw rugs at the top or bottom of stairways, or properly secure the rug with carpet tape to prevent slippage.  Get rid of throw rugs, if possible.  Have an electrician put in a light switch at the top and bottom of the stairs. OTHER FALL PREVENTION TIPS  Wear low-heel or rubber-soled shoes that are supportive and fit well. Wear closed toe shoes.  When using a stepladder, make sure it is fully opened and both spreaders are firmly locked. Do not climb a closed stepladder.  Add color or contrast paint or tape to grab bars and handrails in your home. Place contrasting color strips on first and last steps.  Learn and use mobility aids as needed. Install an electrical emergency response system.  Turn on lights to avoid dark areas. Replace light bulbs that burn  out immediately. Get light switches that glow.  Arrange furniture to create clear pathways. Keep furniture in the same place.  Firmly attach carpet with non-skid or double-sided tape.  Eliminate uneven floor surfaces.  Select a carpet pattern that does not visually hide the edge of steps.  Be aware of all pets. OTHER HOME SAFETY TIPS  Set the water temperature for 120 F (48.8 C).  Keep emergency numbers on or near the telephone.  Keep smoke detectors on every level of the home and near sleeping areas. Document Released: 05/21/2002 Document Revised: 11/30/2011 Document Reviewed: 08/20/2011 [facility] Patient Information 2015 [place], [place]. This information is not intended to replace advice given to you by your health care provider. Make sure you discuss any questions you have with your health care provider.

## 2014-08-13 NOTE — Assessment & Plan Note (Signed)
Check lipid panel currently on simvastatin. Adjust as needed. No symptoms of side effect from medication.

## 2014-08-13 NOTE — Assessment & Plan Note (Signed)
Aged out of colon cancer screening and mammogram. No problems that would make me order those tests. Has had both pneumonia shots, shingles shot. Up to date on flu. No recorded last tetanus shot and she would not like to get that today. Thinks she has had bone density some time ago. Talked with her about home safety today as well as exercise. She is a non-smoker.

## 2014-08-13 NOTE — Assessment & Plan Note (Signed)
States that she uses xanax only several times per week.

## 2014-08-13 NOTE — Assessment & Plan Note (Signed)
Check TSH, currently on low dose synthroid (50 mcg daily). Will adjust as needed base on labs.

## 2014-08-13 NOTE — Progress Notes (Signed)
   Subjective:    Patient ID: Deanna Olson, female    DOB: [birth year], 81 y.o.   MRN: [id number]  HPI The patient is an 81 YO female who is here for medicare wellness. She is also having some sinus congestion. She has been using affrin for the last week or so and is feeling better a little bit. Denies fevers, chills, sinus pressure.   Diet: heart healthy Physical activity: sedentary Depression/mood screen: negative Hearing: some deficits, does not want hearing test, able to follow most conversations Visual acuity: grossly normal, performs annual eye exam  ADLs: capable Fall risk: none Home safety: good Cognitive evaluation: intact to orientation, naming, recall and repetition EOL planning: adv directives in place, DNR agree  I have personally reviewed and have noted 1. The patient's medical and social history 2. Their use of alcohol, tobacco or illicit drugs 3. Their current medications and supplements 4. The patient's functional ability including ADL's, fall risks, home safety risks and hearing or visual impairment. 5. Diet and physical activities 6. Evidence for depression or mood disorders 7. Care team reviewed and updated  Review of Systems  Constitutional: Negative for fever, activity change, appetite change and fatigue.  HENT: Positive for congestion. Negative for ear discharge, ear pain, postnasal drip, rhinorrhea, sinus pressure, sore throat and trouble swallowing.   Eyes: Negative.   Respiratory: Negative for cough, chest tightness, shortness of breath and wheezing.   Cardiovascular: Negative for chest pain, palpitations and leg swelling.  Gastrointestinal: Negative for nausea, abdominal pain, diarrhea, constipation and abdominal distention.  Musculoskeletal: Negative.   Skin: Negative.   Neurological: Negative.   Psychiatric/Behavioral: Negative.       Objective:   Physical Exam  Constitutional: She is oriented to person, place, and time. She appears well-developed  and well-nourished.  HENT:  Head: Normocephalic and atraumatic.  Nose: Nose normal.  Mouth/Throat: Oropharynx is clear and moist.  Eyes: EOM are normal.  Neck: Normal range of motion. Thyromegaly present.  No discrete lesions or nodules in the thyroid  Cardiovascular: Normal rate and regular rhythm.   Murmur heard. Pulmonary/Chest: Effort normal and breath sounds normal. No respiratory distress. She has no wheezes. She has no rales.  Abdominal: Soft. Bowel sounds are normal.  Neurological: She is alert and oriented to person, place, and time. Coordination normal.  Skin: Skin is warm and dry.  Psychiatric: She has a normal mood and affect.   Filed Vitals:   08/13/14 1104  BP: 136/62  Pulse: 62  Temp: 97.8 F (36.6 C)  TempSrc: Oral  Resp: 16  Height: 5\' 4"  (1.626 m)  Weight: 169 lb 6.4 oz (76.839 kg)  SpO2: 97%      Assessment & Plan:

## 2014-08-13 NOTE — Assessment & Plan Note (Signed)
On ACE-I, likely from hypertension over the years. Not diabetic. BP well controlled now and check BMP today.

## 2014-08-13 NOTE — Assessment & Plan Note (Signed)
Rx for flonase and advised to stop taking the affrin. Can also use saline nose spray for symptoms.

## 2014-09-06 ENCOUNTER — Other Ambulatory Visit: Payer: Self-pay | Admitting: Pulmonary Disease

## 2014-09-13 ENCOUNTER — Other Ambulatory Visit: Payer: Self-pay | Admitting: Internal Medicine

## 2014-09-13 ENCOUNTER — Telehealth: Payer: Self-pay | Admitting: Internal Medicine

## 2014-09-13 MED ORDER — ALPRAZOLAM 0.25 MG PO TABS: 0.2500 mg | ORAL_TABLET | Freq: Three times a day (TID) | ORAL | Status: DC | PRN

## 2014-09-13 MED ORDER — VITAMIN D (ERGOCALCIFEROL) 1.25 MG (50000 UNIT) PO CAPS: 50000.0000 [IU] | ORAL_CAPSULE | ORAL | Status: DC

## 2014-09-13 NOTE — Telephone Encounter (Signed)
Called pharmacy spoke with Deanna Olson he stated pharmacist was busy left refill on pharmacist vm...Deanna Olson

## 2014-09-13 NOTE — Telephone Encounter (Signed)
Have refilled please call in.

## 2014-09-13 NOTE — Telephone Encounter (Signed)
Refill has already been call to pharmacy. See phone note...Johny Chess

## 2014-09-13 NOTE — Telephone Encounter (Signed)
Pt called in requesting refill for her   ALPRAZolam (XANAX) 0.25 MG tablet [[id number]] Vit D 5000mg   [facility] [address]

## 2014-09-13 NOTE — Telephone Encounter (Signed)
Sent vitamin d pls advise for renewal alprazolam.../lmb

## 2014-12-26 ENCOUNTER — Other Ambulatory Visit: Payer: Self-pay | Admitting: Internal Medicine

## 2014-12-26 ENCOUNTER — Other Ambulatory Visit: Payer: Self-pay

## 2014-12-26 MED ORDER — LISINOPRIL 20 MG PO TABS: 20.0000 mg | ORAL_TABLET | Freq: Every day | ORAL | Status: DC

## 2014-12-26 NOTE — Telephone Encounter (Signed)
lisinipril rx sent to pharm

## 2014-12-30 ENCOUNTER — Ambulatory Visit (INDEPENDENT_AMBULATORY_CARE_PROVIDER_SITE_OTHER): Payer: Commercial Managed Care - HMO | Admitting: Internal Medicine

## 2014-12-30 ENCOUNTER — Encounter: Payer: Self-pay | Admitting: Internal Medicine

## 2014-12-30 ENCOUNTER — Ambulatory Visit (INDEPENDENT_AMBULATORY_CARE_PROVIDER_SITE_OTHER)
Admission: RE | Admit: 2014-12-30 | Discharge: 2014-12-30 | Disposition: A | Discharge status: Home / Self Care | Payer: Commercial Managed Care - HMO | Source: Ambulatory Visit | Attending: Internal Medicine | Admitting: Internal Medicine

## 2014-12-30 VITALS — BP 116/52 | HR 65 | Temp 97.8°F | Resp 16 | Wt 166.0 lb

## 2014-12-30 DIAGNOSIS — R102 Pelvic and perineal pain: Secondary | ICD-10-CM

## 2014-12-30 NOTE — Progress Notes (Signed)
Pre visit review using our clinic review tool, if applicable. No additional management support is needed unless otherwise documented below in the visit note. 

## 2014-12-30 NOTE — Progress Notes (Signed)
   Subjective:    Patient ID: Deanna Olson, female    DOB: [birth year], 81 y.o.   MRN: [id number]  HPI She was cleaning the tub 12/19/14 when she fell backwards landing on her buttocks and elbows. She hit her head on the commode but did not lose consciousness. There was no cardiac or neurologic prodrome prior to the fall. She now has pain in the left ischial area when she sits .Pain does not disturb sleep at night. She's been using a topical pain reliever with Aspercreme and lidocaine with moderate pain relief.  Occasionally she has some dizziness standing up.  She has no other associated neuromuscular deficits.   Review of Systems  Denied were any change in heart rhythm or rate prior to the event. There was no associated chest pain or shortness of breath .  Also specifically denied prior to the episode were headache, limb weakness, tingling, or numbness. No seizure activity noted.  Fever, chills, sweats, or unexplained weight loss not present. No significant headaches. Mental status change or memory loss denied. Blurred vision , diplopia or vision loss absent. Vertigo, near syncope or imbalance denied. There is no numbness, tingling, or weakness in extremities.   No loss of control of bladder or bowels. Radicular type pain absent. No seizure stigmata.     Objective:   Physical Exam  Pertinent or positive findings include:  She has a grade 1. 5-2 systolic murmur at the base. She has pain attempting to lie flat in the left issue area. There is point tenderness in the left posterior ischial area.  General appearance :adequately nourished; in no distress.  Eyes: No conjunctival inflammation or scleral icterus is present.  Heart:  Normal rate and regular rhythm. S1 and S2 normal without gallop, murmur, click, rub or other extra sounds    Lungs:Chest clear to auscultation; no wheezes, rhonchi,rales ,or rubs present.No increased work of breathing.   Abdomen: bowel sounds normal, soft  and non-tender without masses, organomegaly or hernias noted.  No guarding or rebound. No flank tenderness to percussion.  Vascular : all pulses equal ; no bruits present.  Skin:Warm & dry.  Intact without suspicious lesions or rashes ; no tenting or jaundice   Lymphatic: No lymphadenopathy is noted about the head, neck, axilla  Neuro: Strength, tone & DTRs normal.       Assessment & Plan:  #1 mechanical fall with ischial pain. Rule out fracture.  Plan: See orders

## 2014-12-30 NOTE — Patient Instructions (Signed)
Use a rubber doughnut when sitting for prolonged period time to prevent discomfort in the pelvisx. Soaking  in hot tub will also provide relief.

## 2015-01-01 ENCOUNTER — Telehealth: Payer: Self-pay | Admitting: Internal Medicine

## 2015-01-01 NOTE — Telephone Encounter (Signed)
Advised patient of xray findings and dr hoppers note, no fracture, also results have already been mailed by amy/cma

## 2015-01-01 NOTE — Telephone Encounter (Signed)
Pt called in and would like for a nurse to call her about her xray results for the 18th once they are in.

## 2015-01-11 ENCOUNTER — Other Ambulatory Visit: Payer: Self-pay | Admitting: Pulmonary Disease

## 2015-01-13 ENCOUNTER — Other Ambulatory Visit: Payer: Self-pay | Admitting: Internal Medicine

## 2015-01-13 MED ORDER — SIMVASTATIN 40 MG PO TABS: 40.0000 mg | ORAL_TABLET | Freq: Every day | ORAL | Status: DC

## 2015-01-13 NOTE — Telephone Encounter (Signed)
Pt needs a yearly follow up

## 2015-01-13 NOTE — Telephone Encounter (Signed)
Ok to Rf? Script from previous provider?

## 2015-01-13 NOTE — Telephone Encounter (Signed)
Patient is requesting refill on simvastatin 40 mg.  [facility] [address]  Patient is going out of town for a month tomorrow and is requesting script to be sent today.

## 2015-01-13 NOTE — Telephone Encounter (Signed)
Patient Name: Deanna Olson DOB: [birth year] Initial Comment caller states she is going on a trip tomorrow - she needs one of her meds called in this morning Nurse Assessment Nurse: Ronnald Ramp, RN, Miranda Date/Time (Eastern Time): 01/13/2015 9:27:17 AM Please select the assessment type ---Refill Additional Documentation ---Caller states she is needing refills on several medications. Does the patient have enough medication to last until the office opens? ---Yes Additional Documentation ---Transferred caller to the office for further assistance. Guidelines Guideline Title Affirmed Question Affirmed Notes Final Disposition User Clinical Call Ronnald Ramp, RN, [employer]

## 2015-01-14 ENCOUNTER — Telehealth: Payer: Self-pay | Admitting: *Deleted

## 2015-01-14 NOTE — Telephone Encounter (Signed)
[facility] - Client [place] [facility] Patient Name: Deanna Olson Gender: Female DOB: [birth year] Age: 81 Y 11 M 9 D Return Phone Number: [phone] (Primary) Address: City/State/Zip: [place] Client [facility] - Client Client Site [place] - Day Physician [place] Type Call Call Type Triage / Clinical Relationship To Patient Self Appointment Disposition EMR Appointment Not Necessary Info pasted into Epic Yes Return Phone Number [phone] (Primary) Chief Complaint Prescription Refill or Medication Request (non symptomatic) Initial Comment caller states she is going on a trip Deanna - she needs one of her meds called in this morning Nurse Assessment Nurse: Ronnald Ramp, RN, Miranda Date/Time (Eastern Time): 01/13/2015 9:27:17 AM Please select the assessment type ---Refill Additional Documentation ---Caller states she is needing refills on several medications. Does the patient have enough medication to last until the office opens? ---Yes Additional Documentation ---Transferred caller to the office for further assistance. Guidelines Guideline Title Affirmed Question Affirmed Notes Nurse Date/Time (Eastern Time) Disp. Time Eilene Ghazi Time) Disposition Final User 01/13/2015 9:28:39 AM Clinical Call Yes Ronnald Ramp, RN, Miranda After Care Instructions Given Call Event Type User Date / Time Description

## 2015-02-08 ENCOUNTER — Other Ambulatory Visit: Payer: Self-pay | Admitting: Pulmonary Disease

## 2015-02-14 ENCOUNTER — Telehealth: Payer: Self-pay | Admitting: *Deleted

## 2015-02-14 MED ORDER — ALPRAZOLAM 0.25 MG PO TABS: 0.2500 mg | ORAL_TABLET | Freq: Three times a day (TID) | ORAL | Status: DC | PRN

## 2015-02-14 NOTE — Telephone Encounter (Signed)
Receive call pt is requesting refill on her alprazolam.../lmb

## 2015-02-14 NOTE — Telephone Encounter (Signed)
Notified [facility].../lmb 

## 2015-02-14 NOTE — Telephone Encounter (Signed)
Printed and signed, please fax.  

## 2015-02-24 ENCOUNTER — Ambulatory Visit (INDEPENDENT_AMBULATORY_CARE_PROVIDER_SITE_OTHER): Payer: Commercial Managed Care - HMO | Admitting: Internal Medicine

## 2015-02-24 ENCOUNTER — Encounter: Payer: Self-pay | Admitting: Internal Medicine

## 2015-02-24 VITALS — BP 132/62 | HR 60 | Temp 97.9°F | Resp 16 | Ht 64.5 in | Wt 162.8 lb

## 2015-02-24 DIAGNOSIS — N183 Chronic kidney disease, stage 3 unspecified: Secondary | ICD-10-CM

## 2015-02-24 DIAGNOSIS — Z23 Encounter for immunization: Secondary | ICD-10-CM | POA: Diagnosis not present

## 2015-02-24 DIAGNOSIS — I10 Essential (primary) hypertension: Secondary | ICD-10-CM | POA: Diagnosis not present

## 2015-02-24 DIAGNOSIS — E039 Hypothyroidism, unspecified: Secondary | ICD-10-CM | POA: Diagnosis not present

## 2015-02-24 NOTE — Assessment & Plan Note (Signed)
BP at goal today, continue lasix and lisinopril. Reviewed last BMP and stable so check labs at next visit. No change indicated today.

## 2015-02-24 NOTE — Progress Notes (Signed)
   Subjective:    Patient ID: Deanna Olson, female    DOB: 1926-01-26, 81 y.o.   MRN: [id number]  HPI The patient is an 81 YO female coming in for follow up of her blood pressure. She has had it for many years and no changes recently to her medicines. They do make her urinate more often and she takes then in the morning. Denies side effects. No headaches, chest pains, nausea, confusion. Denies any falls since last visit. Just spent 1 month in [place] with her sister.   Review of Systems  Constitutional: Negative for fever, activity change, appetite change and fatigue.  Eyes: Negative.   Respiratory: Negative for cough, chest tightness, shortness of breath and wheezing.   Cardiovascular: Negative for chest pain, palpitations and leg swelling.  Gastrointestinal: Negative for nausea, abdominal pain, diarrhea, constipation and abdominal distention.  Musculoskeletal: Negative.   Skin: Negative.   Neurological: Negative.   Psychiatric/Behavioral: Negative.       Objective:   Physical Exam  Constitutional: She is oriented to person, place, and time. She appears well-developed and well-nourished.  HENT:  Head: Normocephalic and atraumatic.  Nose: Nose normal.  Mouth/Throat: Oropharynx is clear and moist.  Eyes: EOM are normal.  Neck: Normal range of motion. Thyromegaly present.  No discrete lesions or nodules in the thyroid  Cardiovascular: Normal rate and regular rhythm.   Murmur heard. Pulmonary/Chest: Effort normal and breath sounds normal. No respiratory distress. She has no wheezes. She has no rales.  Abdominal: Soft. Bowel sounds are normal.  Neurological: She is alert and oriented to person, place, and time. Coordination normal.  Skin: Skin is warm and dry.  Psychiatric: She has a normal mood and affect.   Filed Vitals:   02/24/15 1015  BP: 132/62  Pulse: 60  Temp: 97.9 F (36.6 C)  TempSrc: Oral  Resp: 16  Height: 5' 4.5" (1.638 m)  Weight: 162 lb 12.8 oz (73.846 kg)    SpO2: 96%      Assessment & Plan:  Prevnar 13 and flu shot given at visit.

## 2015-02-24 NOTE — Assessment & Plan Note (Signed)
Reviewed TSH from last visit at goal, on synthroid 50 mcg daily. No symptoms of over or under replacement. Check labs at next visit.

## 2015-02-24 NOTE — Assessment & Plan Note (Signed)
BP at goal, no diabetes. Continue ACE-I and last labs stable for the last several years. Will repeat at next visit.

## 2015-02-24 NOTE — Progress Notes (Signed)
Pre visit review using our clinic review tool, if applicable. No additional management support is needed unless otherwise documented below in the visit note. 

## 2015-02-24 NOTE — Patient Instructions (Signed)
We have given you the flu shot today and the pneumonia shot.   We are not changing the medicines and are not checking the blood work today.   Come back in about 6 months for a physical and if you are having any new problems call the office sooner.

## 2015-03-02 ENCOUNTER — Other Ambulatory Visit: Payer: Self-pay | Admitting: Internal Medicine

## 2015-03-29 ENCOUNTER — Other Ambulatory Visit: Payer: Self-pay | Admitting: Internal Medicine

## 2015-04-01 ENCOUNTER — Other Ambulatory Visit: Payer: Self-pay | Admitting: Internal Medicine

## 2015-04-17 ENCOUNTER — Encounter: Payer: Self-pay | Admitting: Internal Medicine

## 2015-04-17 ENCOUNTER — Ambulatory Visit (INDEPENDENT_AMBULATORY_CARE_PROVIDER_SITE_OTHER): Payer: Commercial Managed Care - HMO | Admitting: Internal Medicine

## 2015-04-17 ENCOUNTER — Ambulatory Visit (INDEPENDENT_AMBULATORY_CARE_PROVIDER_SITE_OTHER)
Admission: RE | Admit: 2015-04-17 | Discharge: 2015-04-17 | Disposition: A | Discharge status: Home / Self Care | Payer: Commercial Managed Care - HMO | Source: Ambulatory Visit | Attending: Internal Medicine | Admitting: Internal Medicine

## 2015-04-17 VITALS — BP 138/80 | HR 62 | Wt 162.0 lb

## 2015-04-17 DIAGNOSIS — M25511 Pain in right shoulder: Secondary | ICD-10-CM | POA: Diagnosis not present

## 2015-04-17 DIAGNOSIS — E039 Hypothyroidism, unspecified: Secondary | ICD-10-CM | POA: Diagnosis not present

## 2015-04-17 DIAGNOSIS — M25519 Pain in unspecified shoulder: Secondary | ICD-10-CM | POA: Insufficient documentation

## 2015-04-17 MED ORDER — PREDNISONE 10 MG PO TABS: ORAL_TABLET | ORAL | Status: DC

## 2015-04-17 MED ORDER — HYDROCODONE-ACETAMINOPHEN 5-325 MG PO TABS: 0.5000 | ORAL_TABLET | Freq: Two times a day (BID) | ORAL | Status: DC | PRN

## 2015-04-17 NOTE — Progress Notes (Signed)
Pre visit review using our clinic review tool, if applicable. No additional management support is needed unless otherwise documented below in the visit note. 

## 2015-04-17 NOTE — Progress Notes (Signed)
Subjective:  Patient ID: Deanna Olson, female    DOB: 07/21/1925  Age: 81 y.o. MRN: [id number]  CC: No chief complaint on file.   HPI Deanna Olson presents for R shoulder pain x 2-3 d - no injury. Pt chopped cabbage prior. R hand is weak  Outpatient Prescriptions Prior to Visit  Medication Sig Dispense Refill  . ALPRAZolam (XANAX) 0.25 MG tablet Take 1 tablet (0.25 mg total) by mouth 3 (three) times daily as needed. 90 tablet 3  . clobetasol (TEMOVATE) 0.05 % external solution Use as directed for scale itch    . desonide (DESOWEN) 0.05 % cream Apply 1 application topically 2 (two) times daily.    . fenofibrate 160 MG tablet TAKE 1 TABLET EVERY DAY 90 tablet 3  . fluticasone (FLONASE) 50 MCG/ACT nasal spray Place 2 sprays into both nostrils daily. 16 g 6  . furosemide (LASIX) 40 MG tablet Take 0.5 tablets (20 mg total) by mouth as needed. 30 tablet 5  . ketoconazole (NIZORAL) 2 % shampoo Apply as directed    . levothyroxine (SYNTHROID, LEVOTHROID) 50 MCG tablet TAKE 1 TABLET EVERY DAY 90 tablet 3  . lisinopril (PRINIVIL,ZESTRIL) 20 MG tablet TAKE 1 TABLET (20 MG TOTAL) BY MOUTH DAILY. --PATIENT NEEDS OFFICE VISIT BEFORE ANY FURTHER REFILLS 90 tablet 0  . Multiple Vitamins-Minerals (MULTIVITAMIN WITH MINERALS) tablet Take 1 tablet by mouth daily.    Marland Kitchen omeprazole (PRILOSEC) 20 MG capsule TAKE ONE CAPSULE BY MOUTH EVERY MORNING 90 capsule 3  . simvastatin (ZOCOR) 40 MG tablet TAKE 1 TABLET AT BEDTIME 30 tablet 0  . vitamin C (ASCORBIC ACID) 500 MG tablet Take 500 mg by mouth daily.    . Vitamin D, Ergocalciferol, (DRISDOL) 50000 UNITS CAPS capsule Take 1 capsule (50,000 Units total) by mouth every 7 (seven) days. 12 capsule 3  . Vitamin E 400 UNITS TABS Take 1 tablet by mouth daily.     No facility-administered medications prior to visit.    ROS Review of Systems  Constitutional: Negative for chills, activity change, appetite change, fatigue and unexpected weight change.  HENT:  Negative for congestion, mouth sores and sinus pressure.   Eyes: Negative for visual disturbance.  Respiratory: Negative for cough and chest tightness.   Gastrointestinal: Negative for nausea and abdominal pain.  Genitourinary: Negative for frequency, difficulty urinating and vaginal pain.  Musculoskeletal: Positive for arthralgias and neck pain. Negative for back pain and gait problem.  Skin: Negative for pallor and rash.  Neurological: Negative for dizziness, tremors, weakness, numbness and headaches.  Psychiatric/Behavioral: Negative for confusion and sleep disturbance.    Objective:  BP 138/80 mmHg  Pulse 62  Wt 162 lb (73.483 kg)  SpO2 98%  BP Readings from Last 3 Encounters:  04/17/15 138/80  02/24/15 132/62  12/30/14 116/52    Wt Readings from Last 3 Encounters:  04/17/15 162 lb (73.483 kg)  02/24/15 162 lb 12.8 oz (73.846 kg)  12/30/14 166 lb (75.297 kg)    Physical Exam  Constitutional: She appears well-developed. No distress.  HENT:  Head: Normocephalic.  Right Ear: External ear normal.  Left Ear: External ear normal.  Nose: Nose normal.  Mouth/Throat: Oropharynx is clear and moist.  Eyes: Conjunctivae are normal. Pupils are equal, round, and reactive to light. Right eye exhibits no discharge. Left eye exhibits no discharge.  Neck: Normal range of motion. Neck supple. No JVD present. No tracheal deviation present. No thyromegaly present.  Cardiovascular: Normal rate, regular rhythm and normal heart sounds.  Pulmonary/Chest: No stridor. No respiratory distress. She has no wheezes.  Abdominal: Soft. Bowel sounds are normal. She exhibits no distension and no mass. There is no tenderness. There is no rebound and no guarding.  Musculoskeletal: She exhibits tenderness. She exhibits no edema.  Lymphadenopathy:    She has no cervical adenopathy.  Neurological: She displays normal reflexes. No cranial nerve deficit. She exhibits normal muscle tone. Coordination normal.    Skin: No rash noted. No erythema.  Psychiatric: She has a normal mood and affect. Her behavior is normal. Judgment and thought content normal.  R shoulder, arm, trap are tender  Lab Results  Component Value Date   WBC 6.6 08/13/2014   HGB 13.4 08/13/2014   HCT 40.5 08/13/2014   PLT 250.0 08/13/2014   GLUCOSE 86 08/13/2014   CHOL 196 08/13/2014   TRIG 162.0* 08/13/2014   HDL 39.00* 08/13/2014   LDLDIRECT 99.7 07/21/2006   LDLCALC 125* 08/13/2014   ALT 22 08/13/2014   AST 35 08/13/2014   NA 139 08/13/2014   K 5.1 08/13/2014   CL 104 08/13/2014   CREATININE 1.39* 08/13/2014   BUN 30* 08/13/2014   CO2 28 08/13/2014   TSH 3.52 08/13/2014   HGBA1C 5.4 07/04/2006    Dg Pelvis Comp Min 3v  12/30/2014  CLINICAL DATA:  Left posterior pain after fall on July 7th. Rule out ischial tuberosity fracture. EXAM: [facility] PELVIS - 3+ VIEW COMPARISON:  None. FINDINGS: Femoral heads are located. No displaced fracture identified. Sacroiliac joints are symmetric. IMPRESSION: No acute osseous abnormality. Electronically Signed   By: Abigail Miyamoto M.D.   On: 12/30/2014 15:42    Assessment & Plan:   Diagnoses and all orders for this visit:  Pain in joint of right shoulder -     DG Cervical Spine 2 or 3 views; Future -     DG Shoulder Right; Future  Other orders -     predniSONE (DELTASONE) 10 MG tablet; Prednisone 10 mg: take 4 tabs a day x 3 days; then 3 tabs a day x 4 days; then 2 tabs a day x 4 days, then 1 tab a day x 6 days, then stop. Take pc. -     HYDROcodone-acetaminophen (NORCO/VICODIN) 5-325 MG tablet; Take 0.5-1 tablets by mouth 2 (two) times daily as needed for severe pain.   I am having Deanna Olson start on predniSONE and HYDROcodone-acetaminophen. I am also having her maintain her clobetasol, ketoconazole, desonide, Vitamin E, vitamin C, furosemide, multivitamin with minerals, fluticasone, levothyroxine, Vitamin D (Ergocalciferol), simvastatin, ALPRAZolam, fenofibrate, lisinopril, and  omeprazole.  Meds ordered this encounter  Medications  . predniSONE (DELTASONE) 10 MG tablet    Sig: Prednisone 10 mg: take 4 tabs a day x 3 days; then 3 tabs a day x 4 days; then 2 tabs a day x 4 days, then 1 tab a day x 6 days, then stop. Take pc.    Dispense:  38 tablet    Refill:  1  . HYDROcodone-acetaminophen (NORCO/VICODIN) 5-325 MG tablet    Sig: Take 0.5-1 tablets by mouth 2 (two) times daily as needed for severe pain.    Dispense:  40 tablet    Refill:  0     Follow-up: Return in about 2 weeks (around 05/01/2015) for a follow-up visit.  Walker Kehr, MD

## 2015-04-17 NOTE — Assessment & Plan Note (Addendum)
11/16 acute R shoulder pain -r/o cervical radiculopathy vs other X ray neck, shoulder Prednisone 10 mg: take 4 tabs a day x 3 days; then 3 tabs a day x 4 days; then 2 tabs a day x 4 days, then 1 tab a day x 6 days, then stop. Take pc. Hold Zocor (Simvastatin) Norco prn  Potential benefits of a short term opioids and steroids use as well as potential risks (i.e. addiction risk, apnea etc) and complications (i.e. Somnolence, constipation and others) were explained to the patient and were aknowledged.

## 2015-04-17 NOTE — Patient Instructions (Signed)
Take Prilosec x 2 weeks Hold Simvastatin

## 2015-04-21 NOTE — Assessment & Plan Note (Signed)
Levothroid Rx 

## 2015-04-30 ENCOUNTER — Ambulatory Visit (INDEPENDENT_AMBULATORY_CARE_PROVIDER_SITE_OTHER): Payer: Commercial Managed Care - HMO | Admitting: Internal Medicine

## 2015-04-30 ENCOUNTER — Encounter: Payer: Self-pay | Admitting: Internal Medicine

## 2015-04-30 VITALS — BP 140/78 | HR 62 | Temp 97.9°F | Resp 14 | Ht 64.5 in | Wt 161.0 lb

## 2015-04-30 DIAGNOSIS — M25511 Pain in right shoulder: Secondary | ICD-10-CM | POA: Diagnosis not present

## 2015-04-30 DIAGNOSIS — E785 Hyperlipidemia, unspecified: Secondary | ICD-10-CM | POA: Diagnosis not present

## 2015-04-30 MED ORDER — DICLOFENAC SODIUM 1 % TD GEL: 2.0000 g | Freq: Three times a day (TID) | TRANSDERMAL | Status: DC | PRN

## 2015-04-30 NOTE — Progress Notes (Signed)
   Subjective:    Patient ID: Deanna Olson, female    DOB: 1925-07-14, 81 y.o.   MRN: [id number]  HPI The patient is an 81 YO female coming in for follow up of her right shoulder pain. She was taken off her cholesterol medicine and put on prednisone. Within 2 days she was feeling better. That pain is now gone and is she concerned about her cholesterol. She does feel some brain fog while being on the prednisone which is new. She only has about 4 days left of it. She is having some mild cramps in her legs and using aspercreme which is not that effective.   Review of Systems  Constitutional: Negative for fever, activity change, appetite change and fatigue.  Respiratory: Negative for cough, chest tightness, shortness of breath and wheezing.   Cardiovascular: Negative for chest pain, palpitations and leg swelling.  Gastrointestinal: Negative for nausea, abdominal pain, diarrhea, constipation and abdominal distention.  Musculoskeletal: Positive for myalgias and arthralgias.  Skin: Negative.   Neurological: Negative.        Slight brain fog      Objective:   Physical Exam  Constitutional: She is oriented to person, place, and time. She appears well-developed and well-nourished.  HENT:  Head: Normocephalic and atraumatic.  Nose: Nose normal.  Mouth/Throat: Oropharynx is clear and moist.  Eyes: EOM are normal.  Neck: Normal range of motion. Neck supple.  Cardiovascular: Normal rate and regular rhythm.   Murmur heard. Pulmonary/Chest: Effort normal and breath sounds normal. No respiratory distress. She has no wheezes. She has no rales.  Abdominal: Soft. Bowel sounds are normal.  Musculoskeletal:  No soreness in the shoulder.   Neurological: She is alert and oriented to person, place, and time. Coordination normal.  Skin: Skin is warm and dry.  Psychiatric: She has a normal mood and affect.   Filed Vitals:   04/30/15 0953  BP: 140/78  Pulse: 62  Temp: 97.9 F (36.6 C)  TempSrc: Oral    Resp: 14  Height: 5' 4.5" (1.638 m)  Weight: 161 lb (73.029 kg)  SpO2: 98%      Assessment & Plan:

## 2015-04-30 NOTE — Assessment & Plan Note (Signed)
She is now off her statin and will see her back in 3-6 months to check her lipid panel.

## 2015-04-30 NOTE — Assessment & Plan Note (Signed)
Shoulder and cervical x-ray reviewed with the patient during the visit. She will finish the prednisone and the brain fog should clear. If not she will call us back. Rx for voltaren gel for her leg cramps.

## 2015-04-30 NOTE — Patient Instructions (Signed)
We have sent in the gel that you can rub where you have pain called voltaren gel. You can use it up to 3 times per day as needed. You can use it wherever you have pain.  The mind fog should clear as you come off the prednisone. If it does not improve call the office for advice.   Come back in about 6 months to recheck the cholesterol.

## 2015-04-30 NOTE — Progress Notes (Signed)
Pre visit review using our clinic review tool, if applicable. No additional management support is needed unless otherwise documented below in the visit note. 

## 2015-05-01 ENCOUNTER — Telehealth: Payer: Self-pay | Admitting: Internal Medicine

## 2015-05-01 ENCOUNTER — Other Ambulatory Visit: Payer: Self-pay | Admitting: Geriatric Medicine

## 2015-05-01 DIAGNOSIS — I6523 Occlusion and stenosis of bilateral carotid arteries: Secondary | ICD-10-CM

## 2015-05-01 NOTE — Telephone Encounter (Signed)
error 

## 2015-05-14 ENCOUNTER — Telehealth: Payer: Self-pay | Admitting: Internal Medicine

## 2015-05-14 NOTE — Telephone Encounter (Signed)
appt has been made for 05/16/15,.../lmb

## 2015-05-14 NOTE — Telephone Encounter (Signed)
[place] [facility] - Client [place] Call Center Patient Name: Deanna Olson DOB: 07-Dec-1925 Initial Comment Caller states she has a rash under breast and abdomen. Nurse Assessment Nurse: Mechele Dawley, RN, Amy Date/Time Eilene Ghazi Time): 05/14/2015 11:27:05 AM Confirm and document reason for call. If symptomatic, describe symptoms. ---CALLER STATES THAT SHE HAS A RASH UNDER HER FOLDS. ALL THROUGH THE SUMMER. SHE STATES SHE IS SO RED. SHE HAS TRIED CORN STARCH, AND ANTIBIOTIC OINTMENT. SHE HAS AN APPT WITH DR. Willis. SHE IS MISERABLE SHE STATES. RED AND RAW. Has the patient traveled out of the country within the last 30 days? ---Not Applicable Does the patient have any new or worsening symptoms? ---Yes Will a triage be completed? ---Yes Related visit to physician within the last 2 weeks? ---No Does the PT have any chronic conditions? (i.e. diabetes, asthma, etc.) ---No Is this a behavioral health call? ---No Guidelines Guideline Title Affirmed Question Affirmed Notes Rash or Redness - Localized Red, moist, irritated area between skin folds (or under larger breasts) Final Disposition User See PCP within [place], RN, Amy Referrals REFERRED TO PCP OFFICE Disagree/Comply: Comply

## 2015-05-16 ENCOUNTER — Other Ambulatory Visit (INDEPENDENT_AMBULATORY_CARE_PROVIDER_SITE_OTHER): Payer: Commercial Managed Care - HMO

## 2015-05-16 ENCOUNTER — Ambulatory Visit (INDEPENDENT_AMBULATORY_CARE_PROVIDER_SITE_OTHER): Payer: Commercial Managed Care - HMO | Admitting: Internal Medicine

## 2015-05-16 ENCOUNTER — Encounter: Payer: Self-pay | Admitting: Internal Medicine

## 2015-05-16 VITALS — BP 140/70 | HR 96 | Temp 98.4°F | Resp 14 | Ht 64.5 in | Wt 163.0 lb

## 2015-05-16 DIAGNOSIS — B369 Superficial mycosis, unspecified: Secondary | ICD-10-CM

## 2015-05-16 DIAGNOSIS — E785 Hyperlipidemia, unspecified: Secondary | ICD-10-CM

## 2015-05-16 LAB — LIPID PANEL
CHOL/HDL RATIO: 8
CHOLESTEROL: 279 mg/dL — AB (ref 0–200)
HDL: 36.4 mg/dL — AB (ref >39.00)
NonHDL: 242.13
Triglycerides: 351 mg/dL — ABNORMAL HIGH (ref 0.0–149.0)
VLDL: 70.2 mg/dL — AB (ref 0.0–40.0)

## 2015-05-16 LAB — LDL CHOLESTEROL, DIRECT: Direct LDL: 188 mg/dL

## 2015-05-16 MED ORDER — CLOTRIMAZOLE-BETAMETHASONE 1-0.05 % EX CREA: 1.0000 "application " | TOPICAL_CREAM | Freq: Two times a day (BID) | CUTANEOUS | Status: DC

## 2015-05-16 NOTE — Assessment & Plan Note (Signed)
Lotrisone cream sent in for the rash. Typical appearance.

## 2015-05-16 NOTE — Patient Instructions (Signed)
We have sent in lotrisone cream which you should use on the rash twice a day until the rash is gone. It should be gone in 2 weeks.   We will also check the cholesterol today and call you back with the results.

## 2015-05-16 NOTE — Progress Notes (Signed)
Pre visit review using our clinic review tool, if applicable. No additional management support is needed unless otherwise documented below in the visit note. 

## 2015-05-16 NOTE — Progress Notes (Signed)
   Subjective:    Patient ID: Deanna Olson, female    DOB: June 10, 1926, 81 y.o.   MRN: [id number]  HPI The patient is an 81 YO female coming in for rash. She noticed it several weeks ago and it started under her breasts. It is also on her inguinal region. She does have a roll of fat that hangs down there since her hysterectomy and tends to sweat. She has tried a lot of home remedies which were not effective. She then tried anti-fungal powder which has been helping some. Some itching and slight pain.   Review of Systems  Constitutional: Negative for fever, activity change, appetite change and fatigue.  Respiratory: Negative for cough, chest tightness, shortness of breath and wheezing.   Cardiovascular: Negative for chest pain, palpitations and leg swelling.  Gastrointestinal: Negative for nausea, abdominal pain, diarrhea, constipation and abdominal distention.  Musculoskeletal: Positive for arthralgias.  Skin: Positive for color change and rash. Negative for pallor.      Objective:   Physical Exam  Constitutional: She is oriented to person, place, and time. She appears well-developed and well-nourished.  HENT:  Head: Normocephalic and atraumatic.  Nose: Nose normal.  Mouth/Throat: Oropharynx is clear and moist.  Eyes: EOM are normal.  Neck: Normal range of motion. Neck supple.  Cardiovascular: Normal rate and regular rhythm.   Murmur heard. Pulmonary/Chest: Effort normal and breath sounds normal. No respiratory distress. She has no wheezes. She has no rales.  Abdominal: Soft. Bowel sounds are normal.  Neurological: She is alert and oriented to person, place, and time. Coordination normal.  Skin: Skin is warm and dry.  Fungal appearing rash under both breasts and in the inguinal region, some satellite lesions  Psychiatric: She has a normal mood and affect.   Filed Vitals:   05/16/15 1008  BP: 140/70  Pulse: 96  Temp: 98.4 F (36.9 C)  TempSrc: Oral  Resp: 14  Height: 5' 4.5"  (1.638 m)  Weight: 163 lb (73.936 kg)  SpO2: 97%      Assessment & Plan:

## 2015-05-16 NOTE — Assessment & Plan Note (Signed)
Checking lipid panel today since off cholesterol meds for about 1 month. Myalgias are improved.

## 2015-06-26 ENCOUNTER — Other Ambulatory Visit: Payer: Self-pay | Admitting: Internal Medicine

## 2015-07-29 NOTE — Progress Notes (Signed)
HPI The patient presents for follow-up of aortic stenosis. His was mild on echo in 2016. She returns for follow-up and says that she is doing relatively well.  The patient denies any new symptoms such as chest discomfort, neck or arm discomfort. There has been no new shortness of breath, PND or orthopnea. There have been no reported palpitations, presyncope or syncope.    She does have some chronic mild lower extremity swelling. Of note recently she had some joint pain was taken off fenofibrate and her statin Zocor.  Her muscle aches improved.   Allergies  Allergen Reactions  . Sulfonamide Derivatives     REACTION: angioedema    Current Outpatient Prescriptions  Medication Sig Dispense Refill  . ALPRAZolam (XANAX) 0.25 MG tablet Take 1 tablet (0.25 mg total) by mouth 3 (three) times daily as needed. 90 tablet 3  . clobetasol (TEMOVATE) 0.05 % external solution Use as directed for scale itch    . clotrimazole-betamethasone (LOTRISONE) cream Apply 1 application topically 2 (two) times daily. 30 g 6  . desonide (DESOWEN) 0.05 % cream Apply 1 application topically 2 (two) times daily.    . diclofenac sodium (VOLTAREN) 1 % GEL Apply 2 g topically 3 (three) times daily as needed. 100 g 3  . fluticasone (FLONASE) 50 MCG/ACT nasal spray Place 2 sprays into both nostrils daily. 16 g 6  . HYDROcodone-acetaminophen (NORCO/VICODIN) 5-325 MG tablet Take 0.5-1 tablets by mouth 2 (two) times daily as needed for severe pain. 40 tablet 0  . ketoconazole (NIZORAL) 2 % shampoo Apply as directed    . levothyroxine (SYNTHROID, LEVOTHROID) 50 MCG tablet TAKE 1 TABLET EVERY DAY 90 tablet 3  . lisinopril (PRINIVIL,ZESTRIL) 20 MG tablet TAKE 1 TABLET (20 MG TOTAL) BY MOUTH DAILY. --PATIENT NEEDS OFFICE VISIT BEFORE ANY FURTHER REFILLS 90 tablet 0  . Multiple Vitamins-Minerals (MULTIVITAMIN WITH MINERALS) tablet Take 1 tablet by mouth daily.    Marland Kitchen omeprazole (PRILOSEC) 20 MG capsule TAKE ONE CAPSULE BY MOUTH EVERY  MORNING 90 capsule 3  . vitamin C (ASCORBIC ACID) 500 MG tablet Take 500 mg by mouth daily.    . Vitamin D, Ergocalciferol, (DRISDOL) 50000 UNITS CAPS capsule Take 1 capsule (50,000 Units total) by mouth every 7 (seven) days. 12 capsule 3  . Vitamin E 400 UNITS TABS Take 1 tablet by mouth daily.     No current facility-administered medications for this visit.    Past Medical History  Diagnosis Date  . Allergic rhinitis, cause unspecified   . Hypertension   . Aortic valve disorders     Mild AS 2013 echo   . Unspecified venous (peripheral) insufficiency   . Hyperlipidemia   . Hypothyroid   . Esophageal reflux   . Osteoarthrosis, unspecified whether generalized or localized, unspecified site   . Anxiety state, unspecified   . Herpes zoster without mention of complication   . Eczema     Past Surgical History  Procedure Laterality Date  . Vesicovaginal fistula closure w/ tah    . Cataract extraction, bilateral  2013    ROS:  As stated in the HPI and negative for all other systems.  PHYSICAL EXAM BP 102/80 mmHg  Pulse 62  Ht 5\' 4"  (1.626 m)  Wt 166 lb 7 oz (75.496 kg)  BMI 28.56 kg/m2 GENERAL:  Well appearing NECK:  No jugular venous distention, waveform within normal limits, carotid upstroke brisk and symmetric, positive soft bilateral bruits versus transmitted systolic murmur , no thyromegaly LUNGS:  Clear to auscultation bilaterally CHEST:  Unremarkable  HEART:  PMI not displaced or sustained,S1 and S2 within normal limits, no S3, no S4, no clicks, no rubs, 3 out of 6 mid peaking apical systolic murmur radiating out the aortic outflow tract, no diastolic smurmurs ABD:  Flat, positive bowel sounds normal in frequency in pitch, no bruits, no rebound, no guarding, no midline pulsatile mass, no hepatomegaly, no splenomegaly EXT:  2 plus pulses throughout, no edema, no cyanosis no clubbing   EKG:  Sinus rhythm rate 62, axis within normal limits, left bundle branch block, no  change from previous. 07/30/2015  ASSESSMENT AND PLAN  LBBB:  This is chronic.  No further work is indicated.   HTN:The blood pressure is at target. No change in medications is indicated. We will continue with therapeutic lifestyle changes (TLC).  Aortic stenosis: This was mild last year and I would not suspect that it has progressed and no further imaging is indicated.

## 2015-07-30 ENCOUNTER — Ambulatory Visit (INDEPENDENT_AMBULATORY_CARE_PROVIDER_SITE_OTHER): Payer: PPO | Admitting: Cardiology

## 2015-07-30 ENCOUNTER — Encounter: Payer: Self-pay | Admitting: Cardiology

## 2015-07-30 VITALS — BP 102/80 | HR 62 | Ht 64.0 in | Wt 166.4 lb

## 2015-07-30 DIAGNOSIS — I350 Nonrheumatic aortic (valve) stenosis: Secondary | ICD-10-CM | POA: Diagnosis not present

## 2015-07-30 NOTE — Patient Instructions (Signed)
Your physician recommends that you schedule a follow-up appointment in: ONE YEAR 

## 2015-08-15 ENCOUNTER — Telehealth: Payer: Self-pay | Admitting: Internal Medicine

## 2015-08-15 NOTE — Telephone Encounter (Signed)
Pt called in stating the pharmacy won't fill her ALPRAZolam (XANAX) 0.25 MG tablet [id number] until Wednesday 3/8. She was hoping we can call [facility] to have them fill it. I asked her what date was on the bottle and she states 1/6. Shouldn't this be filled on Monday? Can you please follow up on this?   Also, she has an upcoming appt and she is hoping to get her lab work done ahead of time.  Please advise

## 2015-08-18 ENCOUNTER — Other Ambulatory Visit: Payer: Self-pay | Admitting: Internal Medicine

## 2015-08-19 NOTE — Telephone Encounter (Signed)
[facility].../lmb

## 2015-08-20 ENCOUNTER — Telehealth: Payer: Self-pay | Admitting: Internal Medicine

## 2015-08-26 ENCOUNTER — Other Ambulatory Visit (INDEPENDENT_AMBULATORY_CARE_PROVIDER_SITE_OTHER): Payer: PPO

## 2015-08-26 ENCOUNTER — Ambulatory Visit (INDEPENDENT_AMBULATORY_CARE_PROVIDER_SITE_OTHER): Payer: PPO | Admitting: Internal Medicine

## 2015-08-26 ENCOUNTER — Encounter: Payer: Self-pay | Admitting: Internal Medicine

## 2015-08-26 VITALS — BP 170/80 | HR 67 | Temp 98.2°F | Resp 12 | Ht 64.0 in | Wt 165.0 lb

## 2015-08-26 DIAGNOSIS — E785 Hyperlipidemia, unspecified: Secondary | ICD-10-CM

## 2015-08-26 DIAGNOSIS — I10 Essential (primary) hypertension: Secondary | ICD-10-CM

## 2015-08-26 DIAGNOSIS — E559 Vitamin D deficiency, unspecified: Secondary | ICD-10-CM

## 2015-08-26 DIAGNOSIS — E039 Hypothyroidism, unspecified: Secondary | ICD-10-CM

## 2015-08-26 DIAGNOSIS — Z0000 Encounter for general adult medical examination without abnormal findings: Secondary | ICD-10-CM | POA: Diagnosis not present

## 2015-08-26 DIAGNOSIS — F411 Generalized anxiety disorder: Secondary | ICD-10-CM

## 2015-08-26 LAB — LIPID PANEL
Cholesterol: 276 mg/dL — ABNORMAL HIGH (ref 0–200)
HDL: 46.7 mg/dL (ref >39.00)
NONHDL: 229.66
TRIGLYCERIDES: 227 mg/dL — AB (ref 0.0–149.0)
Total CHOL/HDL Ratio: 6
VLDL: 45.4 mg/dL — ABNORMAL HIGH (ref 0.0–40.0)

## 2015-08-26 LAB — COMPREHENSIVE METABOLIC PANEL
ALBUMIN: 4.4 g/dL (ref 3.5–5.2)
ALT: 20 U/L (ref 0–35)
AST: 26 U/L (ref 0–37)
Alkaline Phosphatase: 51 U/L (ref 39–117)
BUN: 18 mg/dL (ref 6–23)
CHLORIDE: 105 meq/L (ref 96–112)
CO2: 27 meq/L (ref 19–32)
CREATININE: 0.97 mg/dL (ref 0.40–1.20)
Calcium: 10 mg/dL (ref 8.4–10.5)
GFR: 57.66 mL/min — ABNORMAL LOW (ref >60.00)
Glucose, Bld: 95 mg/dL (ref 70–99)
Potassium: 4.8 mEq/L (ref 3.5–5.1)
SODIUM: 142 meq/L (ref 135–145)
Total Bilirubin: 0.5 mg/dL (ref 0.2–1.2)
Total Protein: 7.4 g/dL (ref 6.0–8.3)

## 2015-08-26 LAB — TSH: TSH: 2.71 u[IU]/mL (ref 0.35–4.50)

## 2015-08-26 LAB — VITAMIN D 25 HYDROXY (VIT D DEFICIENCY, FRACTURES): VITD: 37.2 ng/mL (ref 30.00–100.00)

## 2015-08-26 LAB — LDL CHOLESTEROL, DIRECT: Direct LDL: 197 mg/dL

## 2015-08-26 NOTE — Assessment & Plan Note (Signed)
Previously on simvastatin but got muscle reaction that was thought to be related (stopped after cessation). Checking labs today and if LDL high will start pravastatin.

## 2015-08-26 NOTE — Progress Notes (Signed)
   Subjective:    Patient ID: Deanna Olson, female    DOB: [birth year], 82 y.o.   MRN: [id number]  HPI The patient is an 82 YO female coming in for wellness. No new concerns. Doing well overall, wants cholesterol checked and vitamin D.   PMH, Pierz, social history reviewed and updated.   Review of Systems  Constitutional: Negative for fever, activity change, appetite change and fatigue.  HENT: Negative.   Eyes: Negative.   Respiratory: Negative for cough, chest tightness, shortness of breath and wheezing.   Cardiovascular: Negative for chest pain, palpitations and leg swelling.  Gastrointestinal: Negative for nausea, abdominal pain, diarrhea, constipation and abdominal distention.  Musculoskeletal: Positive for arthralgias. Negative for myalgias, back pain and gait problem.  Skin: Negative for color change, pallor and rash.  Neurological: Negative.   Psychiatric/Behavioral: Negative.       Objective:   Physical Exam  Constitutional: She is oriented to person, place, and time. She appears well-developed and well-nourished.  HENT:  Head: Normocephalic and atraumatic.  Nose: Nose normal.  Mouth/Throat: Oropharynx is clear and moist.  Eyes: EOM are normal.  Neck: Normal range of motion. Neck supple.  Cardiovascular: Normal rate and regular rhythm.   Murmur heard. Pulmonary/Chest: Effort normal and breath sounds normal. No respiratory distress. She has no wheezes. She has no rales.  Abdominal: Soft. Bowel sounds are normal.  Neurological: She is alert and oriented to person, place, and time. Coordination normal.  Skin: Skin is warm and dry.  Psychiatric: She has a normal mood and affect.   Filed Vitals:   08/26/15 1033 08/26/15 1105  BP: 160/70 170/80  Pulse: 67   Temp: 98.2 F (36.8 C)   TempSrc: Oral   Resp: 12   Height: 5\' 4"  (1.626 m)   Weight: 165 lb (74.844 kg)   SpO2: 98%       Assessment & Plan:

## 2015-08-26 NOTE — Assessment & Plan Note (Signed)
Taking synthroid 50 mcg daily and check TSH and adjust as needed.

## 2015-08-26 NOTE — Assessment & Plan Note (Signed)
BP previously at goal and mildly elevated today, did not take meds this morning. Taking lisinopril 20 mg daily and checking CMP, adjust as needed.

## 2015-08-26 NOTE — Assessment & Plan Note (Signed)
Has had dexa in the past, does not want tdap today. Given screening recommendations.

## 2015-08-26 NOTE — Progress Notes (Signed)
Pre visit review using our clinic review tool, if applicable. No additional management support is needed unless otherwise documented below in the visit note. 

## 2015-08-26 NOTE — Patient Instructions (Addendum)
We will check the labs today and call you back with the results. Likely you can stop the once weekly vitamin D pill and we will let you know for sure.    Deanna Olson , Thank you for taking time to come for your Medicare Wellness Visit. I appreciate your ongoing commitment to your health goals. Please review the following plan we discussed and let me know if I can assist you in the future.   Dexa scan was a long time ago; will discuss with Dr. Sharlet Salina   Will consider hearing screen if hearing becomes an issue; Deaf & Hard of Hearing Division Services  No reviews  [employer] Office  [phone]  [phone] pays for first hearing aid  ----    These are the goals we discussed: Goals    . Exercise 150 minutes per week (moderate activity)     Walks now 30' 5 days if weather permits Will consider Silver Sneakers        This is a list of the screening recommended for you and due dates:  Health Maintenance  Topic Date Due  . DEXA scan (bone density measurement)  02/03/1993  . Tetanus Vaccine  02/24/2016*  . Flu Shot  01/13/2016  . Shingles Vaccine  Addressed  . Pneumonia vaccines  Completed  *Topic was postponed. The date shown is not the original due date.

## 2015-08-26 NOTE — Assessment & Plan Note (Signed)
Still using xanax prn for some mild anxiety and does well. No falls, confusion, memory change. Reminded them about the risks and harms from long term benzodiazepine usage.

## 2015-08-26 NOTE — Progress Notes (Signed)
This encounter was created in error - please disregard.

## 2015-08-26 NOTE — Progress Notes (Signed)
Medical screening examination/treatment/procedure(s) were performed by non-physician practitioner and as supervising physician I was immediately available for consultation/collaboration. I agree with above. Rilee Knoll A Atara Paterson, MD 

## 2015-08-26 NOTE — Progress Notes (Signed)
Subjective:   Deanna Olson is a 82 y.o. female who presents for Medicare Annual (Subsequent) preventive examination.  Review of Systems:   HRA assessment completed during visit; [name] The Patient was informed that this wellness visit is to identify risk and educate on how to reduce risk for increase disease through lifestyle changes.   ROS deferred to CPE exam with physician  Medical and family hx Mother had stroke; DM Brother had DM; MI; CAD Sister had cancer  Medical issues  HTN; elevated today Hyperlipidemia 05/2015; cho 279; Trig 351; HDL 36; LDL 188; Ratio 8  Off statin due to myalgia; request labs be checked today A1c 5.4 (2008) Herpes zoster hx  Describes health as good Rubs knees so they feel better; takes voltaren gel every day which helps Was dizzy when getting up; Dr. Alain Marion checked for myalgia and dc cholesterol medicine;  Chol has not been checked since; Would like to have that checked   BMI: 28.3 she is comfortable with weight Diet; has cereal for breakfast  Lunch sandwich Supper and meat and 2 vegetables;  Peanut butter and graham crackers;   Loves to cook for people;   Exercise;  Walks a mile with friend; takes about 20 min or 25 to complete Dtr has bought her a stretch band ; will teach her to use this Discussed going to a silver sneaker class   SAFETY;  Safety reviewed for the home;  Removal of clutter clearing paths through the home,  Railing as needed; up around tub; does not sit in tub; showers in tub  Bathroom safety; does has a free standing shower options  Community safety;yes  Smoke detectors yes Firearms safety / to keep in a safe place  Driving accidents and seatbelt/ does still drive; no accidents Sun protection/ maybe in the summer Stressors; sister fell recently; outreaches others and has good social support;   Declines lifeline;   Medication review/ off cholesterol medicine x 3 to 4 months;   Fall assessment x 1  cleaning tub; no injury;    Had one more fall; getting up from the table; slipped on sandals;  Gait assessment overall is good   Mobilization and Functional losses in the last year./ can't think of anything Sleep patterns; sleeps well  with xanax   Urinary or fecal incontinence reviewed;  No stool problems; drinks prune juice before bed; Urgency periodically; doing kagel exercises at home   Counseling: Colonoscopy; 07/94; aged out  EKG: 07/2014 Mammogram; no aged  Dexa; due but has not had a fx; dtr has osteoporosis (states her father had ) Will discuss need with Dr. Sharlet Salina  Hearing: states her hearing is good;   Ophthalmology exam; has one a long time ago; Dr. Kathrin Penner;  Eye exam every 2 years; sometimes right swollen  Immunizations no Tetanus/ discussed; not in dirt; discussed risk and to have one if she cuts herself etc.   Health advice or referrals To continue to exercise more    Current Care Team reviewed and updated   Cardiac Risk Factors include: advanced age (>53men, >23 women);dyslipidemia;hypertension     Objective:     Vitals: BP 170/80 mmHg  Pulse 67  Temp(Src) 98.2 F (36.8 C) (Oral)  Resp 12  Ht 5\' 4"  (1.626 m)  Wt 165 lb (74.844 kg)  BMI 28.31 kg/m2  SpO2 98%  Tobacco History  Smoking status  . Never Smoker   Smokeless tobacco  . Not on file     Counseling given:  Not Answered   Past Medical History  Diagnosis Date  . Allergic rhinitis, cause unspecified   . Hypertension   . Aortic valve disorders     Mild AS 2013 echo   . Unspecified venous (peripheral) insufficiency   . Hyperlipidemia   . Hypothyroid   . Esophageal reflux   . Osteoarthrosis, unspecified whether generalized or localized, unspecified site   . Anxiety state, unspecified   . Herpes zoster without mention of complication   . Eczema    Past Surgical History  Procedure Laterality Date  . Vesicovaginal fistula closure w/ tah    . Cataract extraction, bilateral   2013   Family History  Problem Relation Age of Onset  . Stroke Mother 8  . Diabetes Mother   . Diabetes Brother   . Heart attack Brother 42    s/p open heart surg  . CAD Brother 76  . Cancer Sister     4 types   History  Sexual Activity  . Sexual Activity: Not on file    Outpatient Encounter Prescriptions as of 08/26/2015  Medication Sig  . ALPRAZolam (XANAX) 0.25 MG tablet TAKE 1 TABLET BY MOUTH 3 TIMES A DAY AS NEEDED  . clobetasol (TEMOVATE) 0.05 % external solution Use as directed for scale itch  . clotrimazole-betamethasone (LOTRISONE) cream Apply 1 application topically 2 (two) times daily.  Marland Kitchen desonide (DESOWEN) 0.05 % cream Apply 1 application topically 2 (two) times daily.  . diclofenac sodium (VOLTAREN) 1 % GEL Apply 2 g topically 3 (three) times daily as needed.  Marland Kitchen HYDROcodone-acetaminophen (NORCO/VICODIN) 5-325 MG tablet Take 0.5-1 tablets by mouth 2 (two) times daily as needed for severe pain.  Marland Kitchen ketoconazole (NIZORAL) 2 % shampoo Apply as directed  . levothyroxine (SYNTHROID, LEVOTHROID) 50 MCG tablet TAKE 1 TABLET EVERY DAY  . lisinopril (PRINIVIL,ZESTRIL) 20 MG tablet TAKE 1 TABLET (20 MG TOTAL) BY MOUTH DAILY. --PATIENT NEEDS OFFICE VISIT BEFORE ANY FURTHER REFILLS  . Multiple Vitamins-Minerals (MULTIVITAMIN WITH MINERALS) tablet Take 1 tablet by mouth daily.  Marland Kitchen omeprazole (PRILOSEC) 20 MG capsule TAKE ONE CAPSULE BY MOUTH EVERY MORNING  . vitamin C (ASCORBIC ACID) 500 MG tablet Take 500 mg by mouth daily.  . Vitamin D, Ergocalciferol, (DRISDOL) 50000 UNITS CAPS capsule Take 1 capsule (50,000 Units total) by mouth every 7 (seven) days.  . Vitamin E 400 UNITS TABS Take 1 tablet by mouth daily.  . fluticasone (FLONASE) 50 MCG/ACT nasal spray Place 2 sprays into both nostrils daily. (Patient not taking: Reported on 08/26/2015)   No facility-administered encounter medications on file as of 08/26/2015.    Activities of Daily Living In your present state of health, do  you have any difficulty performing the following activities: 08/26/2015  Difficulty concentrating or making decisions? N  Walking or climbing stairs? Y  Dressing or bathing? N  Doing errands, shopping? N  Preparing Food and eating ? N  Using the Toilet? N  In the past six months, have you accidently leaked urine? N  Do you have problems with loss of bowel control? N  Managing your Medications? N  Managing your Finances? N  Housekeeping or managing your Housekeeping? N    Patient Care Team: Hoyt Koch, MD as PCP - General (Internal Medicine) Druscilla Brownie, MD (Dermatology) Minus Breeding, MD (Cardiology) Shon Hough, MD (Ophthalmology)    Assessment:     Exercise Activities and Dietary recommendations Current Exercise Habits: Home exercise routine, Type of exercise: strength training/weights;walking, Time (Minutes): 30, Frequency (  Times/Week): 5, Weekly Exercise (Minutes/Week): 150, Intensity: Intense  Goals    . Exercise 150 minutes per week (moderate activity)     Walks now 30' 5 days if weather permits Will consider Silver Sneakers       Fall Risk Fall Risk  08/26/2015 04/30/2015 01/10/2013  Falls in the past year? Yes Yes No  Number falls in past yr: 2 or more 2 or more -  Injury with Fall? No No -  Follow up Education provided - -   Depression Screen PHQ 2/9 Scores 08/26/2015 04/30/2015 01/10/2013  PHQ - 2 Score 0 0 0     Cognitive Testing No flowsheet data found.  Immunization History  Administered Date(s) Administered  . Influenza Split 03/17/2011, 04/06/2012  . Influenza Whole 04/12/2005, 03/27/2008, 03/26/2009, 03/14/2010  . Influenza,inj,Quad PF,36+ Mos 03/22/2013, 03/22/2014, 02/24/2015  . Pneumococcal Conjugate-13 02/24/2015  . Pneumococcal Polysaccharide-23 07/17/2013   Screening Tests Health Maintenance  Topic Date Due  . DEXA SCAN  02/03/1993  . TETANUS/TDAP  02/24/2016 (Originally 02/04/1945)  . INFLUENZA VACCINE  01/13/2016  .  ZOSTAVAX  Addressed  . PNA vac Low Risk Adult  Completed      Plan:  Dexa scan was a long time ago; will discuss with Dr. Sharlet Salina   Will consider hearing screen if hearing becomes an issue;   During the course of the visit the patient was educated and counseled about the following appropriate screening and preventive services:   Vaccines to include Pneumoccal, Influenza, Hepatitis B, Td, Zostavax, HCV  Electrocardiogram -07/2014  Cardiovascular Disease/ BP elevated today  Colorectal cancer screening/ aged out  Bone density screening to discuss with Dr. Sharlet Salina;  Diabetes screening/na  Glaucoma screening/ to schedule eye exam   Mammography/PAP aged out  Nutrition counseling / eats well; does not eat out.   Patient Instructions (the written plan) was given to the patient.   Wynetta Fines, RN  08/26/2015

## 2015-08-26 NOTE — Assessment & Plan Note (Signed)
Has been on weekly high dose vitamin D for some time and can likely stop. Checking vitamin D today and stop if indicated.

## 2015-09-01 ENCOUNTER — Other Ambulatory Visit: Payer: Self-pay | Admitting: Internal Medicine

## 2015-09-01 MED ORDER — PRAVASTATIN SODIUM 20 MG PO TABS: 20.0000 mg | ORAL_TABLET | Freq: Every day | ORAL | Status: DC

## 2015-09-02 ENCOUNTER — Telehealth: Payer: Self-pay | Admitting: Internal Medicine

## 2015-09-02 NOTE — Telephone Encounter (Signed)
Pt called you back and I informed her of Dr. Nathanial Millman notes No need to call back

## 2015-09-14 ENCOUNTER — Other Ambulatory Visit: Payer: Self-pay | Admitting: Internal Medicine

## 2015-09-30 ENCOUNTER — Other Ambulatory Visit: Payer: Self-pay | Admitting: Internal Medicine

## 2015-10-07 DIAGNOSIS — Z0100 Encounter for examination of eyes and vision without abnormal findings: Secondary | ICD-10-CM | POA: Diagnosis not present

## 2015-10-07 DIAGNOSIS — H04123 Dry eye syndrome of bilateral lacrimal glands: Secondary | ICD-10-CM | POA: Diagnosis not present

## 2015-10-17 ENCOUNTER — Other Ambulatory Visit: Payer: Self-pay | Admitting: *Deleted

## 2015-10-17 MED ORDER — KETOCONAZOLE 2 % EX SHAM: MEDICATED_SHAMPOO | CUTANEOUS | Status: DC

## 2015-10-17 NOTE — Telephone Encounter (Signed)
Received call pt states she has to use special shampoo Ketonazole she has no more refills [facility].../lmb

## 2015-10-27 ENCOUNTER — Encounter: Payer: Self-pay | Admitting: Family Medicine

## 2015-10-27 ENCOUNTER — Ambulatory Visit (INDEPENDENT_AMBULATORY_CARE_PROVIDER_SITE_OTHER): Payer: PPO | Admitting: Family Medicine

## 2015-10-27 VITALS — BP 120/84 | HR 86 | Temp 98.4°F | Wt 165.4 lb

## 2015-10-27 DIAGNOSIS — R05 Cough: Secondary | ICD-10-CM

## 2015-10-27 DIAGNOSIS — J069 Acute upper respiratory infection, unspecified: Secondary | ICD-10-CM

## 2015-10-27 DIAGNOSIS — R059 Cough, unspecified: Secondary | ICD-10-CM

## 2015-10-27 MED ORDER — BENZONATATE 100 MG PO CAPS: 100.0000 mg | ORAL_CAPSULE | Freq: Three times a day (TID) | ORAL | Status: DC

## 2015-10-27 MED ORDER — AZITHROMYCIN 250 MG PO TABS: ORAL_TABLET | ORAL | Status: DC

## 2015-10-27 NOTE — Patient Instructions (Signed)
Please take antibiotic as directed. Use delsym for cough or benzonatate as needed if delsym is not effective. Follow up if symptoms do not improve in 3-4 days, worsen, or you develop a fever >101.  Please avoid Sudafed as this medication can be an issue for sleep.  Upper Respiratory Infection, Adult Most upper respiratory infections (URIs) are a viral infection of the air passages leading to the lungs. A URI affects the nose, throat, and upper air passages. The most common type of URI is nasopharyngitis and is typically referred to as "the common cold." URIs run their course and usually go away on their own. Most of the time, a URI does not require medical attention, but sometimes a bacterial infection in the upper airways can follow a viral infection. This is called a secondary infection. Sinus and middle ear infections are common types of secondary upper respiratory infections. Bacterial pneumonia can also complicate a URI. A URI can worsen asthma and chronic obstructive pulmonary disease (COPD). Sometimes, these complications can require emergency medical care and may be life threatening.  CAUSES Almost all URIs are caused by viruses. A virus is a type of germ and can spread from one person to another.  RISKS FACTORS You may be at risk for a URI if:   You smoke.   You have chronic heart or lung disease.  You have a weakened defense (immune) system.   You are very young or very old.   You have nasal allergies or asthma.  You work in crowded or poorly ventilated areas.  You work in health care facilities or schools. SIGNS AND SYMPTOMS  Symptoms typically develop 2-3 days after you come in contact with a cold virus. Most viral URIs last 7-10 days. However, viral URIs from the influenza virus (flu virus) can last 14-18 days and are typically more severe. Symptoms may include:   Runny or stuffy (congested) nose.   Sneezing.   Cough.   Sore throat.   Headache.   Fatigue.    Fever.   Loss of appetite.   Pain in your forehead, behind your eyes, and over your cheekbones (sinus pain).  Muscle aches.  DIAGNOSIS  Your health care provider may diagnose a URI by:  Physical exam.  Tests to check that your symptoms are not due to another condition such as:  Strep throat.  Sinusitis.  Pneumonia.  Asthma. TREATMENT  A URI goes away on its own with time. It cannot be cured with medicines, but medicines may be prescribed or recommended to relieve symptoms. Medicines may help:  Reduce your fever.  Reduce your cough.  Relieve nasal congestion. HOME CARE INSTRUCTIONS   Take medicines only as directed by your health care provider.   Gargle warm saltwater or take cough drops to comfort your throat as directed by your health care provider.  Use a warm mist humidifier or inhale steam from a shower to increase air moisture. This may make it easier to breathe.  Drink enough fluid to keep your urine clear or pale yellow.   Eat soups and other clear broths and maintain good nutrition.   Rest as needed.   Return to work when your temperature has returned to normal or as your health care provider advises. You may need to stay home longer to avoid infecting others. You can also use a face mask and careful hand washing to prevent spread of the virus.  Increase the usage of your inhaler if you have asthma.   Do not  use any tobacco products, including cigarettes, chewing tobacco, or electronic cigarettes. If you need help quitting, ask your health care provider. PREVENTION  The best way to protect yourself from getting a cold is to practice good hygiene.   Avoid oral or hand contact with people with cold symptoms.   Wash your hands often if contact occurs.  There is no clear evidence that vitamin C, vitamin E, echinacea, or exercise reduces the chance of developing a cold. However, it is always recommended to get plenty of rest, exercise, and  practice good nutrition.  SEEK MEDICAL CARE IF:   You are getting worse rather than better.   Your symptoms are not controlled by medicine.   You have chills.  You have worsening shortness of breath.  You have brown or red mucus.  You have yellow or brown nasal discharge.  You have pain in your face, especially when you bend forward.  You have a fever.  You have swollen neck glands.  You have pain while swallowing.  You have white areas in the back of your throat. SEEK IMMEDIATE MEDICAL CARE IF:   You have severe or persistent:  Headache.  Ear pain.  Sinus pain.  Chest pain.  You have chronic lung disease and any of the following:  Wheezing.  Prolonged cough.  Coughing up blood.  A change in your usual mucus.  You have a stiff neck.  You have changes in your:  Vision.  Hearing.  Thinking.  Mood. MAKE SURE YOU:   Understand these instructions.  Will watch your condition.  Will get help right away if you are not doing well or get worse.   This information is not intended to replace advice given to you by your health care provider. Make sure you discuss any questions you have with your health care provider.   Document Released: 11/24/2000 Document Revised: 10/15/2014 Document Reviewed: 09/05/2013 Elsevier Interactive Patient Education [employer].

## 2015-10-27 NOTE — Progress Notes (Addendum)
Subjective:    Patient ID: Deanna Olson, female    DOB: 07/08/1925, 82 y.o.   MRN: [id number]  HPI  Ms. Markov is an 82 year old female who presents acutely ill today with hoarseness, coughing with thick mucous that has been white and yellow, rhinitis with clear drainage, sinus pressure/pain, ear pressure/pain, sneezing, and notes chills/sweats for 5 days that is progressively worsening. She denies fever, chills, and sweats while in clinic currently. She also denies N/V/D, symptoms of GERD, recent antibiotic use or recent sick contact exposure. Associated symptom of seasonal allergies and remote history of childhood asthma is noted. Treatment at home includes Sudafed and tessalon which has provided limited benefit. Associated symptoms of inability to sleep that has started with recent Sudafed treatment.   Review of Systems  Constitutional: Positive for chills. Negative for fever.  HENT: Positive for congestion, postnasal drip, rhinorrhea, sinus pressure, sneezing and voice change.   Eyes: Negative for visual disturbance.  Respiratory: Positive for cough. Negative for shortness of breath and wheezing.   Cardiovascular: Negative for chest pain, palpitations and leg swelling.  Gastrointestinal: Negative for nausea, vomiting, abdominal pain and diarrhea.  Genitourinary: Negative for dysuria, urgency and frequency.  Musculoskeletal: Negative for myalgias.  Skin: Negative for rash.  Neurological: Negative for dizziness, light-headedness and headaches.  Psychiatric/Behavioral:       Denies depressed or anxious mood   Past Medical History  Diagnosis Date  . Allergic rhinitis, cause unspecified   . Hypertension   . Aortic valve disorders     Mild AS 2013 echo   . Unspecified venous (peripheral) insufficiency   . Hyperlipidemia   . Hypothyroid   . Esophageal reflux   . Osteoarthrosis, unspecified whether generalized or localized, unspecified site   . Anxiety state, unspecified   . Herpes  zoster without mention of complication   . Eczema      Social History   Social History  . Marital Status: Widowed    Spouse Name: (wilbur Ohlinger)  . Number of Children: N/A  . Years of Education: N/A   Occupational History  . retired    Social History Main Topics  . Smoking status: Never Smoker   . Smokeless tobacco: Not on file  . Alcohol Use: Not on file  . Drug Use: Not on file  . Sexual Activity: Not on file   Other Topics Concern  . Not on file   Social History Narrative    Past Surgical History  Procedure Laterality Date  . Vesicovaginal fistula closure w/ tah    . Cataract extraction, bilateral  2013    Family History  Problem Relation Age of Onset  . Stroke Mother 87  . Diabetes Mother   . Diabetes Brother   . Heart attack Brother 62    s/p open heart surg  . CAD Brother 85  . Cancer Sister     4 types    Allergies  Allergen Reactions  . Sulfonamide Derivatives     REACTION: angioedema    Current Outpatient Prescriptions on File Prior to Visit  Medication Sig Dispense Refill  . ALPRAZolam (XANAX) 0.25 MG tablet TAKE 1 TABLET BY MOUTH 3 TIMES A DAY AS NEEDED 90 tablet 2  . clobetasol (TEMOVATE) 0.05 % external solution Use as directed for scale itch    . clotrimazole-betamethasone (LOTRISONE) cream Apply 1 application topically 2 (two) times daily. 30 g 6  . desonide (DESOWEN) 0.05 % cream Apply 1 application topically 2 (two) times daily.    Marland Kitchen  diclofenac sodium (VOLTAREN) 1 % GEL Apply 2 g topically 3 (three) times daily as needed. 100 g 3  . fluticasone (FLONASE) 50 MCG/ACT nasal spray Place 2 sprays into both nostrils daily. 16 g 6  . ketoconazole (NIZORAL) 2 % shampoo Apply as directed 120 mL 0  . levothyroxine (SYNTHROID, LEVOTHROID) 50 MCG tablet TAKE 1 TABLET BY MOUTH EVERY DAY 90 tablet 3  . lisinopril (PRINIVIL,ZESTRIL) 20 MG tablet TAKE 1 TABLET (20 MG TOTAL) BY MOUTH DAILY. --PATIENT NEEDS OFFICE VISIT BEFORE ANY FURTHER REFILLS 90  tablet 0  . Multiple Vitamins-Minerals (MULTIVITAMIN WITH MINERALS) tablet Take 1 tablet by mouth daily.    Marland Kitchen omeprazole (PRILOSEC) 20 MG capsule TAKE ONE CAPSULE BY MOUTH EVERY MORNING 90 capsule 3  . pravastatin (PRAVACHOL) 20 MG tablet Take 1 tablet (20 mg total) by mouth daily. 90 tablet 3  . vitamin C (ASCORBIC ACID) 500 MG tablet Take 500 mg by mouth daily.    . Vitamin D, Ergocalciferol, (DRISDOL) 50000 UNITS CAPS capsule Take 1 capsule (50,000 Units total) by mouth every 7 (seven) days. 12 capsule 3  . Vitamin E 400 UNITS TABS Take 1 tablet by mouth daily.     No current facility-administered medications on file prior to visit.    BP 120/84 mmHg  Pulse 86  Temp(Src) 98.4 F (36.9 C)  Wt 165 lb 6.4 oz (75.025 kg)  SpO2 97%        Objective:   Physical Exam  Constitutional: She is oriented to person, place, and time. She appears well-developed and well-nourished.  HENT:  Right Ear: Tympanic membrane normal.  Left Ear: Tympanic membrane normal.  Nose: Rhinorrhea present. Right sinus exhibits no maxillary sinus tenderness and no frontal sinus tenderness. Left sinus exhibits no maxillary sinus tenderness and no frontal sinus tenderness.  Mouth/Throat: Mucous membranes are normal. No oropharyngeal exudate.  Cerumen noted in right ear. Mild erythema and post nasal drip noted in throat  Eyes: Pupils are equal, round, and reactive to light.  Itchy, watery eyes noted  Neck: Neck supple.  Cardiovascular: Normal rate and regular rhythm.   Pulmonary/Chest: Effort normal and breath sounds normal. She has no wheezes. She has no rales.  Lymphadenopathy:    She has cervical adenopathy.  Neurological: She is alert and oriented to person, place, and time.  Skin: Skin is warm and dry. No rash noted.  Psychiatric: She has a normal mood and affect. Her behavior is normal. Judgment and thought content normal.      Assessment & Plan:  1. Acute upper respiratory infection Discussed  triggers of an URI including viral infections or bacterial infections that can occur secondarily. Risk factors of age and nasal allergies were communicated also. Further discussion of antibiotic therapy and risks associated have been communicated with patient. Symptoms and exam are consistent with suspected bacterial URI and a Z-pack has been initiated with recommendation to follow up if symptoms do not improve in 3-4 days, worsen, or she develops a fever >101.   - azithromycin (ZITHROMAX) 250 MG tablet; Take 2 tablets today and 1 tablet daily for 4 days.  Dispense: 6 tablet; Refill: 0  2. Cough Delysm for cough as needed and benzonatate for cough that is not controlled with benzonatate.  - benzonatate (TESSALON) 100 MG capsule; Take 1 capsule (100 mg total) by mouth 3 (three) times daily.  Dispense: 20  capsule; Refill: 0  Advised patient to discontinue Sudafed as this is most likely related to her inability to  sleep.   Delano Metz, FNP-C

## 2015-11-08 ENCOUNTER — Other Ambulatory Visit: Payer: Self-pay | Admitting: Internal Medicine

## 2015-11-11 NOTE — Telephone Encounter (Signed)
She should stop the high dose vitamin D and have taken off her list.

## 2015-12-25 ENCOUNTER — Other Ambulatory Visit: Payer: Self-pay | Admitting: Internal Medicine

## 2016-02-12 ENCOUNTER — Other Ambulatory Visit: Payer: Self-pay | Admitting: Internal Medicine

## 2016-02-12 NOTE — Telephone Encounter (Signed)
Faxed script to [facility].../lmb 

## 2016-02-12 NOTE — Telephone Encounter (Signed)
Pt left msg on triage requesting refill on her alprazolam.../lbm

## 2016-02-25 ENCOUNTER — Encounter: Payer: Self-pay | Admitting: Internal Medicine

## 2016-02-25 ENCOUNTER — Ambulatory Visit: Payer: Self-pay | Admitting: Internal Medicine

## 2016-02-25 ENCOUNTER — Ambulatory Visit (INDEPENDENT_AMBULATORY_CARE_PROVIDER_SITE_OTHER): Payer: PPO | Admitting: Internal Medicine

## 2016-02-25 ENCOUNTER — Telehealth: Payer: Self-pay | Admitting: Internal Medicine

## 2016-02-25 DIAGNOSIS — H6092 Unspecified otitis externa, left ear: Secondary | ICD-10-CM

## 2016-02-25 DIAGNOSIS — J3089 Other allergic rhinitis: Secondary | ICD-10-CM

## 2016-02-25 DIAGNOSIS — I10 Essential (primary) hypertension: Secondary | ICD-10-CM

## 2016-02-25 MED ORDER — CIPROFLOXACIN HCL 500 MG PO TABS: 500.0000 mg | ORAL_TABLET | Freq: Two times a day (BID) | ORAL | 0 refills | Status: AC

## 2016-02-25 MED ORDER — CIPROFLOXACIN HCL 500 MG PO TABS: 500.0000 mg | ORAL_TABLET | Freq: Two times a day (BID) | ORAL | 0 refills | Status: DC

## 2016-02-25 MED ORDER — NEOMYCIN-POLYMYXIN-HC 1 % OT SOLN: 3.0000 [drp] | Freq: Four times a day (QID) | OTIC | 0 refills | Status: DC

## 2016-02-25 NOTE — Patient Instructions (Addendum)
Please take all new medication as prescribed - the ear drops, as well as the pill antibiotics (both given in hardcopy)  Please continue all other medications as before, and refills have been done if requested.  Please have the pharmacy call with any other refills you may need  Please keep your appointments with your specialists as you may have planned

## 2016-02-25 NOTE — Assessment & Plan Note (Signed)
With mucoid type d/c, for cortosporin ear gtt but not clear if will be effective if med will not stay in canal; for oral antbx as well,  to f/u any worsening symptoms or concerns

## 2016-02-25 NOTE — Assessment & Plan Note (Signed)
Ok to restart the flonase asd,  to f/u any worsening symptoms or concerns 

## 2016-02-25 NOTE — Progress Notes (Signed)
Subjective:    Patient ID: Deanna Olson, female    DOB: [birth year], 82 y.o.   MRN: [id number]  HPI  Here with 2 days midl to mod persistent left ear pain, reduced hearing and ear d/c with ? Low grade temp, HA, malaise and general weakness, but without high fever, chills, ST, sinus pain, ST, cough and Pt denies chest pain, increased sob or doe, wheezing, orthopnea, PND, increased LE swelling, palpitations, dizziness or syncope.  Nothing makes better or worse.   Does have several wks ongoing nasal allergy symptoms with clearish congestion, itch and sneezing, without fever, pain, ST, cough, swelling or wheezing.  Has a wedding coming up soon  Past Medical History:  Diagnosis Date  . Allergic rhinitis, cause unspecified   . Anxiety state, unspecified   . Aortic valve disorders    Mild AS 2013 echo   . Eczema   . Esophageal reflux   . Herpes zoster without mention of complication   . Hyperlipidemia   . Hypertension   . Hypothyroid   . Osteoarthrosis, unspecified whether generalized or localized, unspecified site   . Unspecified venous (peripheral) insufficiency    Past Surgical History:  Procedure Laterality Date  . CATARACT EXTRACTION, BILATERAL  2013  . VESICOVAGINAL FISTULA CLOSURE W/ TAH      reports that she has never smoked. She does not have any smokeless tobacco history on file. Her alcohol and drug histories are not on file. family history includes CAD (age of onset: 43) in her brother; Cancer in her sister; Diabetes in her brother and mother; Heart attack (age of onset: 68) in her brother; Stroke (age of onset: 91) in her mother. Allergies  Allergen Reactions  . Sulfonamide Derivatives     REACTION: angioedema   Current Outpatient Prescriptions on File Prior to Visit  Medication Sig Dispense Refill  . ALPRAZolam (XANAX) 0.25 MG tablet TAKE 1 TABLET BY MOUTH 3 TIMES A DAY AS NEEDED 90 tablet 2  . benzonatate (TESSALON) 100 MG capsule Take 1 capsule (100 mg total) by mouth 3  (three) times daily. 20 capsule 0  . clobetasol (TEMOVATE) 0.05 % external solution Use as directed for scale itch    . clotrimazole-betamethasone (LOTRISONE) cream Apply 1 application topically 2 (two) times daily. 30 g 6  . desonide (DESOWEN) 0.05 % cream Apply 1 application topically 2 (two) times daily.    . diclofenac sodium (VOLTAREN) 1 % GEL Apply 2 g topically 3 (three) times daily as needed. 100 g 3  . fluticasone (FLONASE) 50 MCG/ACT nasal spray Place 2 sprays into both nostrils daily. 16 g 6  . ketoconazole (NIZORAL) 2 % shampoo Apply as directed 120 mL 0  . levothyroxine (SYNTHROID, LEVOTHROID) 50 MCG tablet TAKE 1 TABLET BY MOUTH EVERY DAY 90 tablet 3  . lisinopril (PRINIVIL,ZESTRIL) 20 MG tablet TAKE 1 TABLET BY MOUTH EVERY DAY (NEED APPT FOR MORE REFILLS!!) 90 tablet 0  . Multiple Vitamins-Minerals (MULTIVITAMIN WITH MINERALS) tablet Take 1 tablet by mouth daily.    Marland Kitchen omeprazole (PRILOSEC) 20 MG capsule TAKE ONE CAPSULE BY MOUTH EVERY MORNING 90 capsule 3  . pravastatin (PRAVACHOL) 20 MG tablet Take 1 tablet (20 mg total) by mouth daily. 90 tablet 3  . vitamin C (ASCORBIC ACID) 500 MG tablet Take 500 mg by mouth daily.    . Vitamin E 400 UNITS TABS Take 1 tablet by mouth daily.     No current facility-administered medications on file prior to visit.  Review of Systems  Constitutional: Negative for unusual diaphoresis or night sweats HENT: Negative for ear swelling or discharge Eyes: Negative for worsening visual haziness  Respiratory: Negative for choking and stridor.   Gastrointestinal: Negative for distension or worsening eructation Genitourinary: Negative for retention or change in urine volume.  Musculoskeletal: Negative for other MSK pain or swelling Skin: Negative for color change and worsening wound Neurological: Negative for tremors and numbness other than noted  Psychiatric/Behavioral: Negative for decreased concentration or agitation other than above         Objective:   Physical Exam BP 130/68   Pulse 91   Temp 98.1 F (36.7 C) (Oral)   Resp 20   Wt 159 lb (72.1 kg)   SpO2 95%   BMI 27.29 kg/m  VS noted,  Constitutional: Pt appears in no apparent distress HENT: Head: NCAT.  Right Ear: External ear normal.  Left Ear: External ear normal Right ear canal clear without erythema, swelling or d/c Left ear canal with 1-2+ erythema/swelling and mucoid d/c at least 50% of the canal length and with 100% canal blockage visualized.  Eyes: . Pupils are equal, round, and reactive to light. Conjunctivae and EOM are normal Neck: Normal range of motion. Neck supple.  Cardiovascular: Normal rate and regular rhythm.   Pulmonary/Chest: Effort normal and breath sounds without rales or wheezing.  Neurological: Pt is alert. Not confused , motor grossly intact Skin: Skin is warm. No rash, no LE edema Psychiatric: Pt behavior is normal. No agitation.     Assessment & Plan:

## 2016-02-25 NOTE — Telephone Encounter (Signed)
Patient has an appt with Dr. Jenny Reichmann today.

## 2016-02-25 NOTE — Progress Notes (Signed)
Pre visit review using our clinic review tool, if applicable. No additional management support is needed unless otherwise documented below in the visit note. 

## 2016-02-25 NOTE — Telephone Encounter (Signed)
Patient Name: Deanna Olson  DOB: 1925-09-02    Initial Comment Yesterday left ear started oozing a little and hard to hear from it.    Nurse Assessment  Nurse: Raphael Gibney, RN, Vanita Ingles Date/Time (Eastern Time): 02/25/2016 9:41:19 AM  Confirm and document reason for call. If symptomatic, describe symptoms. You must click the next button to save text entered. ---Caller states her left ear started oozing some yesterday. Can not hear from her left ear. Throat is not sore. Has dermatitis over her ears. No pain. No fever.  Has the patient traveled out of the country within the last 30 days? ---Not Applicable  Does the patient have any new or worsening symptoms? ---Yes  Will a triage be completed? ---Yes  Related visit to physician within the last 2 weeks? ---No  Does the PT have any chronic conditions? (i.e. diabetes, asthma, etc.) ---No  Is this a behavioral health or substance abuse call? ---No     Guidelines    Guideline Title Affirmed Question Affirmed Notes  Ear - Discharge [1] Clear drainage (not from a head injury) AND [2] persists > 24 hours    Final Disposition User   See Physician within 24 Hours [place], RN, Vanita Ingles    Comments  appt scheduled for 5:30 pm 02/25/2016 with Dr. Cathlean Cower.   Referrals  REFERRED TO PCP OFFICE   Disagree/Comply: Comply

## 2016-02-25 NOTE — Assessment & Plan Note (Signed)
stable overall by history and exam, recent data reviewed with pt, and pt to continue medical treatment as before,  to f/u any worsening symptoms or concerns BP Readings from Last 3 Encounters:  02/25/16 130/68  10/27/15 120/84  08/26/15 (!) 170/80

## 2016-02-26 DIAGNOSIS — I10 Essential (primary) hypertension: Secondary | ICD-10-CM | POA: Diagnosis not present

## 2016-02-26 DIAGNOSIS — H6092 Unspecified otitis externa, left ear: Secondary | ICD-10-CM | POA: Diagnosis not present

## 2016-02-26 DIAGNOSIS — J3089 Other allergic rhinitis: Secondary | ICD-10-CM | POA: Diagnosis not present

## 2016-02-26 MED ORDER — CEFTRIAXONE SODIUM 500 MG IJ SOLR
500.0000 mg | Freq: Once | INTRAMUSCULAR | Status: AC
  Administered 2016-02-26: 500 mg via INTRAMUSCULAR

## 2016-02-26 NOTE — Addendum Note (Signed)
Addended by: Della Goo C on: 02/26/2016 08:13 AM   Modules accepted: Orders

## 2016-03-04 ENCOUNTER — Telehealth: Payer: Self-pay

## 2016-03-04 MED ORDER — FLUCONAZOLE 150 MG PO TABS: 150.0000 mg | ORAL_TABLET | Freq: Once | ORAL | 0 refills | Status: AC

## 2016-03-04 NOTE — Telephone Encounter (Signed)
Patient called and states that she believes this medication she is on is giving her a yeast infection. She has never had one before so she unsure if that is what It is. She has bumps/red and burning in the front and back she states. Please follow up with patient or advise. Thank you.

## 2016-03-04 NOTE — Telephone Encounter (Signed)
It is very possible that it could be. We have sent in diflucan for her to take for the infection.

## 2016-03-05 NOTE — Telephone Encounter (Signed)
Called and informed patient she had an rx to pick up.

## 2016-03-12 ENCOUNTER — Telehealth: Payer: Self-pay | Admitting: Internal Medicine

## 2016-03-12 NOTE — Telephone Encounter (Signed)
Would you put orders in for a urine sample, or would you like patient to come in tomorrow.

## 2016-03-12 NOTE — Telephone Encounter (Signed)
Can come to Saturday clinic

## 2016-03-12 NOTE — Telephone Encounter (Signed)
[facility] Patient Name: Deanna Olson DOB: [birth year] Initial Comment Caller was put on Cipro for ear infection a few weeks ago and now she thinks she has a UTI Nurse Assessment Nurse: Markus Daft, RN, Sherre Poot Date/Time (Eastern Time): 03/12/2016 2:30:00 PM Confirm and document reason for call. If symptomatic, describe symptoms. You must click the next button to save text entered. ---Caller states that now she thinks she has a UTI. She has pain/ burning with urination in last day. Almost incontinent. Slept well last night. No fever. -- She was put on Cipro for ear infection a few weeks ago and she had a yeast infection and took one Diflucan which helped her last weekend. No further ear pain or vaginal itching. Has the patient traveled out of the country within the last 30 days? ---Not Applicable Does the patient have any new or worsening symptoms? ---Yes Will a triage be completed? ---Yes Related visit to physician within the last 2 weeks? ---No Does the PT have any chronic conditions? (i.e. diabetes, asthma, etc.) ---Yes List chronic conditions. ---HTN, thyroid, GERD, high cholesterol Is this a behavioral health or substance abuse call? ---No Guidelines Guideline Title Affirmed Question Affirmed Notes Urination Pain - Female Age > 56 years Final Disposition User See Physician within [address] [place], [place], Sherre Poot Comments No available appts today, but given Saturday hrs # and advised to call back tomorrow. She would like a flu shot while there. IF she can come by office and give a urine sample today, please call her. Referrals REFERRED TO PCP OFFICE Disagree/Comply: Comply

## 2016-03-13 ENCOUNTER — Encounter: Payer: Self-pay | Admitting: Family Medicine

## 2016-03-13 ENCOUNTER — Ambulatory Visit (INDEPENDENT_AMBULATORY_CARE_PROVIDER_SITE_OTHER): Payer: PPO | Admitting: Family Medicine

## 2016-03-13 VITALS — BP 130/70 | HR 84 | Temp 98.0°F | Resp 16 | Wt 159.0 lb

## 2016-03-13 DIAGNOSIS — B373 Candidiasis of vulva and vagina: Secondary | ICD-10-CM

## 2016-03-13 DIAGNOSIS — R300 Dysuria: Secondary | ICD-10-CM

## 2016-03-13 DIAGNOSIS — B3731 Acute candidiasis of vulva and vagina: Secondary | ICD-10-CM

## 2016-03-13 LAB — POCT URINALYSIS DIPSTICK
Bilirubin, UA: NEGATIVE
Blood, UA: NEGATIVE
Glucose, UA: NEGATIVE
KETONES UA: NEGATIVE
LEUKOCYTES UA: NEGATIVE
Nitrite, UA: NEGATIVE
PH UA: 6
PROTEIN UA: NEGATIVE
SPEC GRAV UA: 1.015
Urobilinogen, UA: 0.2

## 2016-03-13 MED ORDER — CLOTRIMAZOLE-BETAMETHASONE 1-0.05 % EX CREA: 1.0000 "application " | TOPICAL_CREAM | Freq: Two times a day (BID) | CUTANEOUS | 1 refills | Status: DC

## 2016-03-13 MED ORDER — FLUCONAZOLE 150 MG PO TABS: ORAL_TABLET | ORAL | 0 refills | Status: DC

## 2016-03-13 NOTE — Progress Notes (Signed)
Dr. Karleen Hampshire T. Rydell Wiegel, MD, CAQ [facility] [address] [place], [address] Phone: [phone] Fax: [phone]  03/13/2016  Patient: Deanna Olson, MRN: [id number], DOB: 12/31/1925, 82 y.o.  Primary Physician:  Myrlene Broker, MD   Chief Complaint  Patient presents with  . Dysuria    Was given an antibiotic on 02/26/16, had dysuria since.    Subjective:   Deanna Olson is an 82 y.o. very pleasant female patient who presents with the following:  Middle of September got an ear infection. Gave a shot with a cipro prescription. Then got some burning with urination.   She actually received Diflucan 1 dose, and most of her symptoms resolved, but they came back.  Her symptoms are primarily exterior.  Past Medical History, Surgical History, Social History, Family History, Problem List, Medications, and Allergies have been reviewed and updated if relevant.  Patient Active Problem List   Diagnosis Date Noted  . External otitis of left ear 02/25/2016  . Pain in joint, shoulder region 04/17/2015  . CKD (chronic kidney disease) stage 3, GFR 30-59 ml/min 08/13/2014  . Routine general medical examination at a health care facility 08/13/2014  . Vitamin D deficiency 12/02/2009  . AORTIC VALVE DISEASE 01/24/2008  . DEGENERATIVE JOINT DISEASE 11/05/2007  . Hypothyroidism 10/31/2007  . Essential hypertension 10/31/2007  . Allergic rhinitis 10/31/2007  . Hyperlipidemia 04/12/2007  . Anxiety state 04/12/2007  . GERD 04/12/2007    Past Medical History:  Diagnosis Date  . Allergic rhinitis, cause unspecified   . Anxiety state, unspecified   . Aortic valve disorders    Mild AS 2013 echo   . Eczema   . Esophageal reflux   . Herpes zoster without mention of complication   . Hyperlipidemia   . Hypertension   . Hypothyroid   . Osteoarthrosis, unspecified whether generalized or localized, unspecified site   . Unspecified venous (peripheral)  insufficiency     Past Surgical History:  Procedure Laterality Date  . CATARACT EXTRACTION, BILATERAL  2013  . VESICOVAGINAL FISTULA CLOSURE W/ TAH      Social History   Social History  . Marital status: Widowed    Spouse name: (wilbur Scotti)  . Number of children: N/A  . Years of education: N/A   Occupational History  . retired    Social History Main Topics  . Smoking status: Never Smoker  . Smokeless tobacco: Not on file  . Alcohol use Not on file  . Drug use: Unknown  . Sexual activity: Not on file   Other Topics Concern  . Not on file   Social History Narrative  . No narrative on file    Family History  Problem Relation Age of Onset  . Stroke Mother 52  . Diabetes Mother   . Diabetes Brother   . Heart attack Brother 48    s/p open heart surg  . CAD Brother 76  . Cancer Sister     4 types    Allergies  Allergen Reactions  . Sulfonamide Derivatives     REACTION: angioedema    Medication list reviewed and updated in full in [facility].   GEN: No acute illnesses, no fevers, chills. GI: No n/v/d, eating normally Pulm: No SOB Interactive and getting along well at home.  Otherwise, ROS is as per the HPI.  Objective:   BP 130/70   Pulse 84   Temp 98 F (36.7 C) (Oral)   Resp  16   Wt 159 lb (72.1 kg)   SpO2 97%   BMI 27.29 kg/m   GEN: WDWN, NAD, Non-toxic, A & O x 3 HEENT: Atraumatic, Normocephalic. Neck supple. No masses, No LAD. Ears and Nose: No external deformity. CV: RRR, No M/G/R. No JVD. No thrill. No extra heart sounds. PULM: CTA B, no wheezes, crackles, rhonchi. No retractions. No resp. distress. No accessory muscle use. EXTR: No c/c/e NEURO Normal gait.  PSYCH: Normally interactive. Conversant. Not depressed or anxious appearing.  Calm demeanor.   Laboratory and Imaging Data: Results for orders placed or performed in visit on 03/13/16  POCT urinalysis dipstick  Result Value Ref Range   Color, UA yellow    Clarity, UA  clear    Glucose, UA neg    Bilirubin, UA neg    Ketones, UA neg    Spec Grav, UA 1.015    Blood, UA neg    pH, UA 6.0    Protein, UA neg    Urobilinogen, UA 0.2    Nitrite, UA neg    Leukocytes, UA Negative Negative     Assessment and Plan:   Yeast infection involving the vagina and surrounding area  Dysuria - Plan: POCT urinalysis dipstick  Diflucan, three-week protocol.  Attempted to send and clotrimazole, but  Combination drug sent.  I suspect that a few days of this will actually help with some of the burning  Associated, but we will call on Monday to make sure she limits its use.  Follow-up: No Follow-up on file.  New Prescriptions   CLOTRIMAZOLE-BETAMETHASONE (LOTRISONE) CREAM    Apply 1 application topically 2 (two) times daily.   FLUCONAZOLE (DIFLUCAN) 150 MG TABLET    Take 1 tablet po once a week. Take each Saturday.   Orders Placed This Encounter  Procedures  . POCT urinalysis dipstick    Signed,  Karleen Hampshire T. Saber Dickerman, MD   Patient's Medications  New Prescriptions   CLOTRIMAZOLE-BETAMETHASONE (LOTRISONE) CREAM    Apply 1 application topically 2 (two) times daily.   FLUCONAZOLE (DIFLUCAN) 150 MG TABLET    Take 1 tablet po once a week. Take each Saturday.  Previous Medications   ALPRAZOLAM (XANAX) 0.25 MG TABLET    TAKE 1 TABLET BY MOUTH 3 TIMES A DAY AS NEEDED   BENZONATATE (TESSALON) 100 MG CAPSULE    Take 1 capsule (100 mg total) by mouth 3 (three) times daily.   CLOBETASOL (TEMOVATE) 0.05 % EXTERNAL SOLUTION    Use as directed for scale itch   CLOTRIMAZOLE-BETAMETHASONE (LOTRISONE) CREAM    Apply 1 application topically 2 (two) times daily.   DESONIDE (DESOWEN) 0.05 % CREAM    Apply 1 application topically 2 (two) times daily.   DICLOFENAC SODIUM (VOLTAREN) 1 % GEL    Apply 2 g topically 3 (three) times daily as needed.   FLUTICASONE (FLONASE) 50 MCG/ACT NASAL SPRAY    Place 2 sprays into both nostrils daily.   KETOCONAZOLE (NIZORAL) 2 % SHAMPOO    Apply  as directed   LEVOTHYROXINE (SYNTHROID, LEVOTHROID) 50 MCG TABLET    TAKE 1 TABLET BY MOUTH EVERY DAY   LISINOPRIL (PRINIVIL,ZESTRIL) 20 MG TABLET    TAKE 1 TABLET BY MOUTH EVERY DAY (NEED APPT FOR MORE REFILLS!!)   MULTIPLE VITAMINS-MINERALS (MULTIVITAMIN WITH MINERALS) TABLET    Take 1 tablet by mouth daily.   NEOMYCIN-POLYMYXIN-HYDROCORTISONE (CORTISPORIN) 1 % SOLN OTIC SOLUTION    Place 3 drops into the left ear every 6 (six) hours.  OMEPRAZOLE (PRILOSEC) 20 MG CAPSULE    TAKE ONE CAPSULE BY MOUTH EVERY MORNING   PRAVASTATIN (PRAVACHOL) 20 MG TABLET    Take 1 tablet (20 mg total) by mouth daily.   VITAMIN C (ASCORBIC ACID) 500 MG TABLET    Take 500 mg by mouth daily.   VITAMIN E 400 UNITS TABS    Take 1 tablet by mouth daily.  Modified Medications   No medications on file  Discontinued Medications   No medications on file

## 2016-03-13 NOTE — Progress Notes (Signed)
Pre visit review using our clinic review tool, if applicable. No additional management support is needed unless otherwise documented below in the visit note. 

## 2016-03-18 ENCOUNTER — Ambulatory Visit (INDEPENDENT_AMBULATORY_CARE_PROVIDER_SITE_OTHER): Payer: PPO

## 2016-03-18 DIAGNOSIS — Z23 Encounter for immunization: Secondary | ICD-10-CM

## 2016-03-26 ENCOUNTER — Other Ambulatory Visit: Payer: Self-pay | Admitting: Internal Medicine

## 2016-04-07 ENCOUNTER — Other Ambulatory Visit: Payer: Self-pay | Admitting: *Deleted

## 2016-04-07 MED ORDER — OMEPRAZOLE 20 MG PO CPDR: 20.0000 mg | DELAYED_RELEASE_CAPSULE | Freq: Every morning | ORAL | 1 refills | Status: DC

## 2016-06-05 ENCOUNTER — Ambulatory Visit (INDEPENDENT_AMBULATORY_CARE_PROVIDER_SITE_OTHER): Payer: PPO | Admitting: Primary Care

## 2016-06-05 ENCOUNTER — Encounter: Payer: Self-pay | Admitting: Primary Care

## 2016-06-05 VITALS — BP 122/64 | HR 67 | Temp 98.6°F | Wt 163.8 lb

## 2016-06-05 DIAGNOSIS — J069 Acute upper respiratory infection, unspecified: Secondary | ICD-10-CM

## 2016-06-05 MED ORDER — AZITHROMYCIN 250 MG PO TABS: ORAL_TABLET | ORAL | 0 refills | Status: DC

## 2016-06-05 NOTE — Patient Instructions (Addendum)
Your symptoms are representative of a viral illness which will resolve on its own over time. Our goal is to treat your symptoms in order to aid your body in the healing process and to make you more comfortable.   Cough/Congestion: Try taking Delsym DM for cough. This may be purchased over the counter.  Please refrain from filling the antibiotic for 24-48 hours if possible. Your symptoms should resolve on their own without the antibiotics.  Ensure you are staying hydrated with water and rest.  It was a pleasure meeting you!   Upper Respiratory Infection, Adult Most upper respiratory infections (URIs) are a viral infection of the air passages leading to the lungs. A URI affects the nose, throat, and upper air passages. The most common type of URI is nasopharyngitis and is typically referred to as "the common cold." URIs run their course and usually go away on their own. Most of the time, a URI does not require medical attention, but sometimes a bacterial infection in the upper airways can follow a viral infection. This is called a secondary infection. Sinus and middle ear infections are common types of secondary upper respiratory infections. Bacterial pneumonia can also complicate a URI. A URI can worsen asthma and chronic obstructive pulmonary disease (COPD). Sometimes, these complications can require emergency medical care and may be life threatening. What are the causes? Almost all URIs are caused by viruses. A virus is a type of germ and can spread from one person to another. What increases the risk? You may be at risk for a URI if:  You smoke.  You have chronic heart or lung disease.  You have a weakened defense (immune) system.  You are very young or very old.  You have nasal allergies or asthma.  You work in crowded or poorly ventilated areas.  You work in health care facilities or schools. What are the signs or symptoms? Symptoms typically develop 2-3 days after you come in  contact with a cold virus. Most viral URIs last 7-10 days. However, viral URIs from the influenza virus (flu virus) can last 14-18 days and are typically more severe. Symptoms may include:  Runny or stuffy (congested) nose.  Sneezing.  Cough.  Sore throat.  Headache.  Fatigue.  Fever.  Loss of appetite.  Pain in your forehead, behind your eyes, and over your cheekbones (sinus pain).  Muscle aches. How is this diagnosed? Your health care provider may diagnose a URI by:  Physical exam.  Tests to check that your symptoms are not due to another condition such as:  Strep throat.  Sinusitis.  Pneumonia.  Asthma. How is this treated? A URI goes away on its own with time. It cannot be cured with medicines, but medicines may be prescribed or recommended to relieve symptoms. Medicines may help:  Reduce your fever.  Reduce your cough.  Relieve nasal congestion. Follow these instructions at home:  Take medicines only as directed by your health care provider.  Gargle warm saltwater or take cough drops to comfort your throat as directed by your health care provider.  Use a warm mist humidifier or inhale steam from a shower to increase air moisture. This may make it easier to breathe.  Drink enough fluid to keep your urine clear or pale yellow.  Eat soups and other clear broths and maintain good nutrition.  Rest as needed.  Return to work when your temperature has returned to normal or as your health care provider advises. You may need to  stay home longer to avoid infecting others. You can also use a face mask and careful hand washing to prevent spread of the virus.  Increase the usage of your inhaler if you have asthma.  Do not use any tobacco products, including cigarettes, chewing tobacco, or electronic cigarettes. If you need help quitting, ask your health care provider. How is this prevented? The best way to protect yourself from getting a cold is to practice good  hygiene.  Avoid oral or hand contact with people with cold symptoms.  Wash your hands often if contact occurs. There is no clear evidence that vitamin C, vitamin E, echinacea, or exercise reduces the chance of developing a cold. However, it is always recommended to get plenty of rest, exercise, and practice good nutrition. Contact a health care provider if:  You are getting worse rather than better.  Your symptoms are not controlled by medicine.  You have chills.  You have worsening shortness of breath.  You have brown or red mucus.  You have yellow or brown nasal discharge.  You have pain in your face, especially when you bend forward.  You have a fever.  You have swollen neck glands.  You have pain while swallowing.  You have white areas in the back of your throat. Get help right away if:  You have severe or persistent:  Headache.  Ear pain.  Sinus pain.  Chest pain.  You have chronic lung disease and any of the following:  Wheezing.  Prolonged cough.  Coughing up blood.  A change in your usual mucus.  You have a stiff neck.  You have changes in your:  Vision.  Hearing.  Thinking.  Mood. This information is not intended to replace advice given to you by your health care provider. Make sure you discuss any questions you have with your health care provider. Document Released: 11/24/2000 Document Revised: 02/01/2016 Document Reviewed: 09/05/2013 Elsevier Interactive Patient Education  2017 [employer].

## 2016-06-05 NOTE — Progress Notes (Signed)
Pre visit review using our clinic review tool, if applicable. No additional management support is needed unless otherwise documented below in the visit note. 

## 2016-06-05 NOTE — Progress Notes (Signed)
Subjective:    Patient ID: Deanna Olson, female    DOB: [birth year], 82 y.o.   MRN: [id number]  HPI  Ms. Hannegan is an 82 year old female with a history of GERD, allergic rhinitis who presents today with a chief complaint of cough. She also reports nasal congestion, sore throat, fatigue. She's been taking cough drops, using Afrin nasal spray (prior 3 days), and gargling salt water without much improvement. Her symptoms have been present for the past 5 days. Her cough is mildly productive with yellowish sputum. Overall she's not feeling any better.  Review of Systems  Constitutional: Positive for fatigue. Negative for chills and fever.  HENT: Positive for congestion and sore throat. Negative for ear pain.   Respiratory: Positive for cough. Negative for shortness of breath.   Cardiovascular: Negative for chest pain.  Neurological: Negative for dizziness and weakness.       Past Medical History:  Diagnosis Date  . Allergic rhinitis, cause unspecified   . Anxiety state, unspecified   . Aortic valve disorders    Mild AS 2013 echo   . Eczema   . Esophageal reflux   . Herpes zoster without mention of complication   . Hyperlipidemia   . Hypertension   . Hypothyroid   . Osteoarthrosis, unspecified whether generalized or localized, unspecified site   . Unspecified venous (peripheral) insufficiency      Social History   Social History  . Marital status: Widowed    Spouse name: (wilbur Mcelhannon)  . Number of children: N/A  . Years of education: N/A   Occupational History  . retired    Social History Main Topics  . Smoking status: Never Smoker  . Smokeless tobacco: Not on file  . Alcohol use Not on file  . Drug use: Unknown  . Sexual activity: Not on file   Other Topics Concern  . Not on file   Social History Narrative  . No narrative on file    Past Surgical History:  Procedure Laterality Date  . CATARACT EXTRACTION, BILATERAL  2013  . VESICOVAGINAL FISTULA CLOSURE W/  TAH      Family History  Problem Relation Age of Onset  . Stroke Mother 85  . Diabetes Mother   . Diabetes Brother   . Heart attack Brother 19    s/p open heart surg  . CAD Brother 43  . Cancer Sister     4 types    Allergies  Allergen Reactions  . Sulfonamide Derivatives     REACTION: angioedema    Current Outpatient Prescriptions on File Prior to Visit  Medication Sig Dispense Refill  . ALPRAZolam (XANAX) 0.25 MG tablet TAKE 1 TABLET BY MOUTH 3 TIMES A DAY AS NEEDED 90 tablet 2  . clobetasol (TEMOVATE) 0.05 % external solution Use as directed for scale itch    . clotrimazole-betamethasone (LOTRISONE) cream Apply 1 application topically 2 (two) times daily. 45 g 1  . desonide (DESOWEN) 0.05 % cream Apply 1 application topically 2 (two) times daily.    . diclofenac sodium (VOLTAREN) 1 % GEL Apply 2 g topically 3 (three) times daily as needed. 100 g 3  . fluconazole (DIFLUCAN) 150 MG tablet Take 1 tablet po once a week. Take each Saturday. 3 tablet 0  . fluticasone (FLONASE) 50 MCG/ACT nasal spray Place 2 sprays into both nostrils daily. 16 g 6  . ketoconazole (NIZORAL) 2 % shampoo USE AS DIRECTED 120 mL 0  . levothyroxine (SYNTHROID, LEVOTHROID) 50 MCG  tablet TAKE 1 TABLET BY MOUTH EVERY DAY 90 tablet 3  . lisinopril (PRINIVIL,ZESTRIL) 20 MG tablet TAKE 1 TABLET BY MOUTH EVERY DAY (NEEDS APPOINTMENT FOR MORE REFILLS) 90 tablet 0  . Multiple Vitamins-Minerals (MULTIVITAMIN WITH MINERALS) tablet Take 1 tablet by mouth daily.    . NEOMYCIN-POLYMYXIN-HYDROCORTISONE (CORTISPORIN) 1 % SOLN otic solution Place 3 drops into the left ear every 6 (six) hours. 10 mL 0  . omeprazole (PRILOSEC) 20 MG capsule Take 1 capsule (20 mg total) by mouth every morning. 90 capsule 1  . pravastatin (PRAVACHOL) 20 MG tablet Take 1 tablet (20 mg total) by mouth daily. 90 tablet 3  . vitamin C (ASCORBIC ACID) 500 MG tablet Take 500 mg by mouth daily.    . Vitamin E 400 UNITS TABS Take 1 tablet by mouth  daily.     No current facility-administered medications on file prior to visit.     BP 122/64 (BP Location: Right Arm, Patient Position: Sitting, Cuff Size: Large)   Pulse 67   Temp 98.6 F (37 C) (Oral)   Wt 163 lb 12 oz (74.3 kg)   SpO2 96%   BMI 28.11 kg/m    Objective:   Physical Exam  Constitutional: She appears well-nourished. She does not appear ill.  HENT:  Right Ear: Tympanic membrane and ear canal normal.  Left Ear: Tympanic membrane and ear canal normal.  Nose: Right sinus exhibits no maxillary sinus tenderness and no frontal sinus tenderness. Left sinus exhibits no maxillary sinus tenderness and no frontal sinus tenderness.  Mouth/Throat: Oropharynx is clear and moist.  Eyes: Conjunctivae are normal.  Neck: Neck supple.  Cardiovascular: Normal rate and regular rhythm.   Pulmonary/Chest: Effort normal and breath sounds normal. She has no wheezes. She has no rales.  Lymphadenopathy:    She has no cervical adenopathy.  Skin: Skin is warm and dry.          Assessment & Plan:  URI:  Cough, congestion, sore throat, fatigue x 5 days. No improvement with OTC treatment. Exam today with clear lungs, does not appear acutely ill, stable vitals. Suspect viral URI and will treat with supportive measures. Rx for Zpak printed for her to fill in 48 hours if symptoms progress. She verbalized understanding. Discussed use of Delsym, fluids, rest. Return precautions provided.  Sheral Flow, NP

## 2016-06-25 ENCOUNTER — Other Ambulatory Visit: Payer: Self-pay | Admitting: Internal Medicine

## 2016-06-25 DIAGNOSIS — H10413 Chronic giant papillary conjunctivitis, bilateral: Secondary | ICD-10-CM | POA: Diagnosis not present

## 2016-06-25 DIAGNOSIS — H04123 Dry eye syndrome of bilateral lacrimal glands: Secondary | ICD-10-CM | POA: Diagnosis not present

## 2016-07-06 ENCOUNTER — Encounter: Payer: Self-pay | Admitting: Internal Medicine

## 2016-07-06 ENCOUNTER — Ambulatory Visit (INDEPENDENT_AMBULATORY_CARE_PROVIDER_SITE_OTHER): Payer: PPO | Admitting: Internal Medicine

## 2016-07-06 DIAGNOSIS — M545 Low back pain, unspecified: Secondary | ICD-10-CM | POA: Insufficient documentation

## 2016-07-06 DIAGNOSIS — G8929 Other chronic pain: Secondary | ICD-10-CM

## 2016-07-06 DIAGNOSIS — M25561 Pain in right knee: Secondary | ICD-10-CM

## 2016-07-06 MED ORDER — PREDNISONE 20 MG PO TABS: 40.0000 mg | ORAL_TABLET | Freq: Every day | ORAL | 0 refills | Status: DC

## 2016-07-06 NOTE — Assessment & Plan Note (Addendum)
Rx for prednisone burst for the pain. No indication for imaging at this time. No red flag symptoms. She can continue to use voltaren gel and tylenol for pain as well as heating pad.

## 2016-07-06 NOTE — Patient Instructions (Signed)
We have sent in the prednisone for the back and knee. Take 2 pills a day for the next 5 days to calm that down.   It is okay to keep using the voltaren gel or tylenol for the pain.   It is okay also to use the heating pad.

## 2016-07-06 NOTE — Progress Notes (Signed)
Pre visit review using our clinic review tool, if applicable. No additional management support is needed unless otherwise documented below in the visit note. 

## 2016-07-06 NOTE — Assessment & Plan Note (Signed)
Acute on chronic pain in the knee. She will continue to use voltaren gel and tylenol and heating pad. No indication for imaging. She likely has arthritis and discussed the need for activity that she can tolerate to help with mobility.

## 2016-07-06 NOTE — Progress Notes (Signed)
   Subjective:    Patient ID: Deanna Olson, female    DOB: 1925-12-11, 83 y.o.   MRN: [id number]  HPI The patient is an 83 YO female coming in for knee and back pain new since Tuesday. No injury, fall or strain that she is aware of. She is having pain in the low back and also in her right knee. She does have pain in the right knee some of the time and uses voltaren gel on it. She has been using the voltaren gel on the back and knee which is helping some. She is also using a heating pad on the area with good results. She denies weakness or numbness in the legs and no pain radiating down the legs.   Review of Systems  Constitutional: Positive for activity change. Negative for appetite change, chills, fatigue, fever and unexpected weight change.  Respiratory: Negative.   Cardiovascular: Negative.   Gastrointestinal: Negative.   Musculoskeletal: Positive for arthralgias and myalgias. Negative for back pain, gait problem, neck pain and neck stiffness.  Skin: Negative.   Neurological: Negative.       Objective:   Physical Exam  Constitutional: She is oriented to person, place, and time. She appears well-developed and well-nourished.  HENT:  Head: Normocephalic and atraumatic.  Eyes: EOM are normal.  Cardiovascular: Normal rate and regular rhythm.   Pulmonary/Chest: Effort normal.  Abdominal: Soft.  Musculoskeletal: She exhibits tenderness.  Pain in the lumbar region spinally and left greater than right paraspinally. Right knee with diffuse mild pain, ACL and PCL intact, no effusion.   Neurological: She is alert and oriented to person, place, and time. Coordination abnormal.  Slow to rise and slow gait.   Skin: Skin is warm and dry.   Vitals:   07/06/16 0924  BP: 138/64  Pulse: 69  Temp: 97.7 F (36.5 C)  TempSrc: Oral  SpO2: 99%  Weight: 164 lb (74.4 kg)  Height: 5\' 4"  (1.626 m)      Assessment & Plan:

## 2016-07-09 DIAGNOSIS — Z0100 Encounter for examination of eyes and vision without abnormal findings: Secondary | ICD-10-CM | POA: Diagnosis not present

## 2016-07-09 DIAGNOSIS — H04123 Dry eye syndrome of bilateral lacrimal glands: Secondary | ICD-10-CM | POA: Diagnosis not present

## 2016-07-09 DIAGNOSIS — Z961 Presence of intraocular lens: Secondary | ICD-10-CM | POA: Diagnosis not present

## 2016-07-27 NOTE — Progress Notes (Deleted)
HPI The patient presents for follow-up of aortic stenosis. His was mild on echo in 2016. She returns for follow-up.  ***  and says that she is doing relatively well.  The patient denies any new symptoms such as chest discomfort, neck or arm discomfort. There has been no new shortness of breath, PND or orthopnea. There have been no reported palpitations, presyncope or syncope.    She does have some chronic mild lower extremity swelling. Of note recently she had some joint pain was taken off fenofibrate and her statin Zocor.  Her muscle aches improved.   Allergies  Allergen Reactions  . Sulfonamide Derivatives     REACTION: angioedema    Current Outpatient Prescriptions  Medication Sig Dispense Refill  . ALPRAZolam (XANAX) 0.25 MG tablet TAKE 1 TABLET BY MOUTH 3 TIMES A DAY AS NEEDED 90 tablet 2  . clobetasol (TEMOVATE) 0.05 % external solution Use as directed for scale itch    . clotrimazole-betamethasone (LOTRISONE) cream Apply 1 application topically 2 (two) times daily. 45 g 1  . desonide (DESOWEN) 0.05 % cream Apply 1 application topically 2 (two) times daily.    . diclofenac sodium (VOLTAREN) 1 % GEL Apply 2 g topically 3 (three) times daily as needed. 100 g 3  . fluconazole (DIFLUCAN) 150 MG tablet Take 1 tablet po once a week. Take each Saturday. 3 tablet 0  . fluticasone (FLONASE) 50 MCG/ACT nasal spray Place 2 sprays into both nostrils daily. 16 g 6  . ketoconazole (NIZORAL) 2 % shampoo USE AS DIRECTED 120 mL 0  . levothyroxine (SYNTHROID, LEVOTHROID) 50 MCG tablet TAKE 1 TABLET BY MOUTH EVERY DAY 90 tablet 3  . lisinopril (PRINIVIL,ZESTRIL) 20 MG tablet TAKE 1 TABLET BY MOUTH EVERY DAY (NEEDS APPOINTMENT FOR MORE REFILLS) 90 tablet 0  . Multiple Vitamins-Minerals (MULTIVITAMIN WITH MINERALS) tablet Take 1 tablet by mouth daily.    . NEOMYCIN-POLYMYXIN-HYDROCORTISONE (CORTISPORIN) 1 % SOLN otic solution Place 3 drops into the left ear every 6 (six) hours. 10 mL 0  . omeprazole  (PRILOSEC) 20 MG capsule Take 1 capsule (20 mg total) by mouth every morning. 90 capsule 1  . pravastatin (PRAVACHOL) 20 MG tablet Take 1 tablet (20 mg total) by mouth daily. 90 tablet 3  . predniSONE (DELTASONE) 20 MG tablet Take 2 tablets (40 mg total) by mouth daily with breakfast. 10 tablet 0  . vitamin C (ASCORBIC ACID) 500 MG tablet Take 500 mg by mouth daily.    . Vitamin E 400 UNITS TABS Take 1 tablet by mouth daily.     No current facility-administered medications for this visit.     Past Medical History:  Diagnosis Date  . Allergic rhinitis, cause unspecified   . Anxiety state, unspecified   . Aortic valve disorders    Mild AS 2013 echo   . Eczema   . Esophageal reflux   . Herpes zoster without mention of complication   . Hyperlipidemia   . Hypertension   . Hypothyroid   . Osteoarthrosis, unspecified whether generalized or localized, unspecified site   . Unspecified venous (peripheral) insufficiency     Past Surgical History:  Procedure Laterality Date  . CATARACT EXTRACTION, BILATERAL  2013  . VESICOVAGINAL FISTULA CLOSURE W/ TAH      ROS:  *** As stated in the HPI and negative for all other systems.  PHYSICAL EXAM There were no vitals taken for this visit. GENERAL:  Well appearing NECK:  No jugular venous distention, waveform  within normal limits, carotid upstroke brisk and symmetric, positive soft bilateral bruits versus transmitted systolic murmur , no thyromegaly LUNGS:  Clear to auscultation bilaterally CHEST:  Unremarkable  HEART:  PMI not displaced or sustained,S1 and S2 within normal limits, no S3, no S4, no clicks, no rubs, 3 out of 6 mid peaking apical systolic murmur radiating out the aortic outflow tract, no diastolic smurmurs ABD:  Flat, positive bowel sounds normal in frequency in pitch, no bruits, no rebound, no guarding, no midline pulsatile mass, no hepatomegaly, no splenomegaly EXT:  2 plus pulses throughout, no edema, no cyanosis no  clubbing   EKG:  Sinus rhythm rate ***, axis within normal limits, left bundle branch block, no change from previous. 07/27/2016  ASSESSMENT AND PLAN  LBBB:  This is chronic.  No further work is indicated.   HTN:The blood pressure is at target. No change in medications is indicated. We will continue with therapeutic lifestyle changes (TLC).  Aortic stenosis: This was mild last year and I would not suspect that it has progressed and no further imaging is indicated.

## 2016-07-29 ENCOUNTER — Encounter: Payer: Self-pay | Admitting: Internal Medicine

## 2016-07-29 ENCOUNTER — Ambulatory Visit (INDEPENDENT_AMBULATORY_CARE_PROVIDER_SITE_OTHER): Payer: PPO | Admitting: Internal Medicine

## 2016-07-29 ENCOUNTER — Telehealth: Payer: Self-pay | Admitting: Internal Medicine

## 2016-07-29 ENCOUNTER — Ambulatory Visit: Payer: PPO | Admitting: Cardiology

## 2016-07-29 VITALS — BP 162/78 | HR 80 | Temp 100.2°F | Ht 64.0 in | Wt 166.0 lb

## 2016-07-29 DIAGNOSIS — R062 Wheezing: Secondary | ICD-10-CM | POA: Diagnosis not present

## 2016-07-29 DIAGNOSIS — J209 Acute bronchitis, unspecified: Secondary | ICD-10-CM | POA: Diagnosis not present

## 2016-07-29 MED ORDER — ALBUTEROL SULFATE (2.5 MG/3ML) 0.083% IN NEBU
2.5000 mg | INHALATION_SOLUTION | Freq: Once | RESPIRATORY_TRACT | Status: AC
  Administered 2016-07-29: 2.5 mg via RESPIRATORY_TRACT

## 2016-07-29 MED ORDER — DOXYCYCLINE HYCLATE 100 MG PO TABS: 100.0000 mg | ORAL_TABLET | Freq: Two times a day (BID) | ORAL | 0 refills | Status: DC

## 2016-07-29 MED ORDER — PREDNISONE 20 MG PO TABS: 40.0000 mg | ORAL_TABLET | Freq: Every day | ORAL | 0 refills | Status: DC

## 2016-07-29 MED ORDER — ALBUTEROL SULFATE HFA 108 (90 BASE) MCG/ACT IN AERS: 2.0000 | INHALATION_SPRAY | Freq: Four times a day (QID) | RESPIRATORY_TRACT | 0 refills | Status: DC | PRN

## 2016-07-29 NOTE — Patient Instructions (Signed)
We have sent in the antibiotic called doxycycline. Take 1 pill twice a day for 1 week.   We have also sent in the prednisone for the wheezing. Take 2 pills a day for 5 days then stop.  We have also sent in the albuterol inhaler which you can use as needed for shortness or breath or being shortwinded. You can also use it for wheezing. You can use 2 puffs of the inhaler up to every 4 hours as needed.

## 2016-07-29 NOTE — Telephone Encounter (Signed)
[place] [facility] Patient Name: SHEMIAH DEERY DOB: 01-Apr-1926 Initial Comment Jerni states she has started wheezing last night. She has an appt tomorrow but is wanting to be seen today. Nurse Assessment Nurse: Andria Frames, RN, Aeriel Date/Time (Eastern Time): 07/29/2016 8:17:58 AM Confirm and document reason for call. If symptomatic, describe symptoms. ---Tamela Oddi states she has a cough, she started wheezing last night around 3 am. Caller denies fever. Caller states, she is coughing stuff up. Does the patient have any new or worsening symptoms? ---Yes Will a triage be completed? ---Yes Related visit to physician within the last 2 weeks? ---No Does the PT have any chronic conditions? (i.e. diabetes, asthma, etc.) ---No Is this a behavioral health or substance abuse call? ---No Guidelines Guideline Title Affirmed Question Affirmed Notes Cough - Acute Productive Difficulty breathing Final Disposition User Go to ED Now Hensel, RN, Aeriel Comments Caller refused, caller states, she would like an appt instead. Referrals GO TO FACILITY REFUSED Disagree/Comply: Disagree Disagree/Comply Reason: Disagree with instructions

## 2016-07-29 NOTE — Telephone Encounter (Signed)
Contacted patient and made her aware that her apt is today at 4, not tomorrow

## 2016-07-29 NOTE — Progress Notes (Signed)
Pre visit review using our clinic review tool, if applicable. No additional management support is needed unless otherwise documented below in the visit note. 

## 2016-07-29 NOTE — Progress Notes (Signed)
   Subjective:    Patient ID: Deanna Olson, female    DOB: [birth year], 83 y.o.   MRN: [id number]  HPI The patient is an 83 YO female coming in for new wheezing and cough. Going on for 1-2 weeks. She is not able to do much at home. She has no energy. She gets SOB even doing small things. She has not had fevers but has had chills since last week. Coughing up green to yellow sputum. She has tried otc cold medication and cough medication and it has helped mildly. She feels like she is wheezing with breathing deeply or with coughing sometimes. Worse at night time. Overall she feels that she is worsening since onset and not improving at all. She denies significant nose congestion or drainage although there is some mild.   Review of Systems  Constitutional: Positive for activity change, appetite change, chills and fatigue. Negative for fever and unexpected weight change.  HENT: Positive for congestion. Negative for ear discharge, ear pain, postnasal drip, rhinorrhea, sinus pain, sinus pressure, sore throat and trouble swallowing.   Eyes: Negative.   Respiratory: Positive for cough, shortness of breath and wheezing. Negative for chest tightness.   Cardiovascular: Negative.   Gastrointestinal: Negative.   Musculoskeletal: Negative.   Skin: Negative.   Neurological: Negative.   Psychiatric/Behavioral: Negative.       Objective:   Physical Exam  Constitutional: She is oriented to person, place, and time. She appears well-developed and well-nourished.  HENT:  Head: Normocephalic and atraumatic.  Right Ear: External ear normal.  Left Ear: External ear normal.  Nose: Nose normal.  Oropharynx with mild redness, no drainage.   Eyes: EOM are normal.  Neck: Normal range of motion. No JVD present.  Cardiovascular: Normal rate and regular rhythm.   Pulmonary/Chest: Effort normal. No respiratory distress. She has wheezes. She has no rales.  Scattered inspiratory and expiratory wheezing and rhonchi which  do not clear with cough, after albuterol still some expiratory wheezing but overall improved and good air flow.   Abdominal: Soft.  Musculoskeletal: She exhibits no edema.  Lymphadenopathy:    She has no cervical adenopathy.  Neurological: She is alert and oriented to person, place, and time.  Skin: Skin is warm and dry.   Vitals:   07/29/16 1600  BP: (!) 162/78  Pulse: 80  Temp: 100.2 F (37.9 C)  TempSrc: Oral  SpO2: 98%  Weight: 166 lb (75.3 kg)  Height: 5\' 4"  (1.626 m)      Assessment & Plan:  Albuterol nebulizer given at visit.

## 2016-07-29 NOTE — Telephone Encounter (Signed)
Please call pt about this 

## 2016-07-30 DIAGNOSIS — J209 Acute bronchitis, unspecified: Secondary | ICD-10-CM | POA: Insufficient documentation

## 2016-07-30 NOTE — Assessment & Plan Note (Signed)
Lungs improved with albuterol treatment. Given course and severity will rx prednisone and doxycycline as well as albuterol inhaler to help with her symptoms.

## 2016-08-02 ENCOUNTER — Telehealth: Payer: Self-pay | Admitting: *Deleted

## 2016-08-02 MED ORDER — ALPRAZOLAM 0.25 MG PO TABS: 0.2500 mg | ORAL_TABLET | Freq: Two times a day (BID) | ORAL | 1 refills | Status: DC | PRN

## 2016-08-02 NOTE — Telephone Encounter (Signed)
Notified pt rx faxed to [facility]../[facility]  

## 2016-08-02 NOTE — Telephone Encounter (Signed)
Rec'd call pt states she is needing refill on her alprazolam.../LMB

## 2016-08-02 NOTE — Telephone Encounter (Signed)
Printed and signed. Has not been filling monthly so will appropriately decrease amount.

## 2016-08-09 ENCOUNTER — Ambulatory Visit (INDEPENDENT_AMBULATORY_CARE_PROVIDER_SITE_OTHER)
Admission: RE | Admit: 2016-08-09 | Discharge: 2016-08-09 | Disposition: A | Discharge status: Home / Self Care | Payer: PPO | Source: Ambulatory Visit | Attending: Nurse Practitioner | Admitting: Nurse Practitioner

## 2016-08-09 ENCOUNTER — Telehealth: Payer: Self-pay | Admitting: Internal Medicine

## 2016-08-09 ENCOUNTER — Ambulatory Visit (INDEPENDENT_AMBULATORY_CARE_PROVIDER_SITE_OTHER): Payer: PPO | Admitting: Nurse Practitioner

## 2016-08-09 ENCOUNTER — Encounter: Payer: Self-pay | Admitting: Nurse Practitioner

## 2016-08-09 VITALS — BP 132/86 | HR 92 | Temp 97.7°F | Ht 64.0 in | Wt 160.0 lb

## 2016-08-09 DIAGNOSIS — R0989 Other specified symptoms and signs involving the circulatory and respiratory systems: Secondary | ICD-10-CM

## 2016-08-09 DIAGNOSIS — J209 Acute bronchitis, unspecified: Secondary | ICD-10-CM

## 2016-08-09 DIAGNOSIS — J9801 Acute bronchospasm: Secondary | ICD-10-CM

## 2016-08-09 DIAGNOSIS — R0602 Shortness of breath: Secondary | ICD-10-CM | POA: Diagnosis not present

## 2016-08-09 DIAGNOSIS — R05 Cough: Secondary | ICD-10-CM | POA: Diagnosis not present

## 2016-08-09 MED ORDER — BENZONATATE 100 MG PO CAPS: 100.0000 mg | ORAL_CAPSULE | Freq: Three times a day (TID) | ORAL | 0 refills | Status: DC | PRN

## 2016-08-09 MED ORDER — FLUTICASONE PROPIONATE HFA 110 MCG/ACT IN AERO: 2.0000 | INHALATION_SPRAY | Freq: Two times a day (BID) | RESPIRATORY_TRACT | 12 refills | Status: DC

## 2016-08-09 MED ORDER — DM-GUAIFENESIN ER 30-600 MG PO TB12: 1.0000 | ORAL_TABLET | Freq: Two times a day (BID) | ORAL | 0 refills | Status: DC | PRN

## 2016-08-09 MED ORDER — IPRATROPIUM-ALBUTEROL 20-100 MCG/ACT IN AERS: 1.0000 | INHALATION_SPRAY | Freq: Four times a day (QID) | RESPIRATORY_TRACT | 0 refills | Status: DC

## 2016-08-09 NOTE — Progress Notes (Signed)
Subjective:  Patient ID: Deanna Olson, female    DOB: 05/08/1926  Age: 83 y.o. MRN: [id number]  CC: Cough (pt stated coughing, wheezing for about 2 days)   Cough  This is a new problem. The current episode started 1 to 4 weeks ago. The problem has been unchanged. The problem occurs constantly. The cough is productive of sputum. Associated symptoms include nasal congestion, postnasal drip, rhinorrhea, shortness of breath and wheezing. Pertinent negatives include no chest pain, chills, ear congestion, ear pain, fever, headaches, hemoptysis, myalgias or weight loss. The symptoms are aggravated by lying down. She has tried a beta-agonist inhaler and oral steroids (and doxycycline) for the symptoms. Her past medical history is significant for bronchitis.    Outpatient Medications Prior to Visit  Medication Sig Dispense Refill  . ALPRAZolam (XANAX) 0.25 MG tablet Take 1 tablet (0.25 mg total) by mouth 2 (two) times daily as needed. 60 tablet 1  . clobetasol (TEMOVATE) 0.05 % external solution Use as directed for scale itch    . clotrimazole-betamethasone (LOTRISONE) cream Apply 1 application topically 2 (two) times daily. 45 g 1  . desonide (DESOWEN) 0.05 % cream Apply 1 application topically 2 (two) times daily.    . diclofenac sodium (VOLTAREN) 1 % GEL Apply 2 g topically 3 (three) times daily as needed. 100 g 3  . fluconazole (DIFLUCAN) 150 MG tablet Take 1 tablet po once a week. Take each Saturday. 3 tablet 0  . fluticasone (FLONASE) 50 MCG/ACT nasal spray Place 2 sprays into both nostrils daily. 16 g 6  . ketoconazole (NIZORAL) 2 % shampoo USE AS DIRECTED 120 mL 0  . levothyroxine (SYNTHROID, LEVOTHROID) 50 MCG tablet TAKE 1 TABLET BY MOUTH EVERY DAY 90 tablet 3  . lisinopril (PRINIVIL,ZESTRIL) 20 MG tablet TAKE 1 TABLET BY MOUTH EVERY DAY (NEEDS APPOINTMENT FOR MORE REFILLS) 90 tablet 0  . Multiple Vitamins-Minerals (MULTIVITAMIN WITH MINERALS) tablet Take 1 tablet by mouth daily.    .  NEOMYCIN-POLYMYXIN-HYDROCORTISONE (CORTISPORIN) 1 % SOLN otic solution Place 3 drops into the left ear every 6 (six) hours. 10 mL 0  . omeprazole (PRILOSEC) 20 MG capsule Take 1 capsule (20 mg total) by mouth every morning. 90 capsule 1  . pravastatin (PRAVACHOL) 20 MG tablet Take 1 tablet (20 mg total) by mouth daily. 90 tablet 3  . vitamin C (ASCORBIC ACID) 500 MG tablet Take 500 mg by mouth daily.    . Vitamin E 400 UNITS TABS Take 1 tablet by mouth daily.    Marland Kitchen albuterol (PROVENTIL HFA;VENTOLIN HFA) 108 (90 Base) MCG/ACT inhaler Inhale 2 puffs into the lungs every 6 (six) hours as needed for wheezing or shortness of breath. 1 Inhaler 0  . predniSONE (DELTASONE) 20 MG tablet Take 2 tablets (40 mg total) by mouth daily with breakfast. 10 tablet 0  . doxycycline (VIBRA-TABS) 100 MG tablet Take 1 tablet (100 mg total) by mouth 2 (two) times daily. 14 tablet 0   No facility-administered medications prior to visit.     ROS See HPI  Objective:  BP 132/86   Pulse 92   Temp 97.7 F (36.5 C)   Ht 5\' 4"  (1.626 m)   Wt 160 lb (72.6 kg)   SpO2 98%   BMI 27.46 kg/m   BP Readings from Last 3 Encounters:  08/09/16 132/86  07/29/16 (!) 162/78  07/06/16 138/64    Wt Readings from Last 3 Encounters:  08/09/16 160 lb (72.6 kg)  07/29/16 166 lb (75.3 kg)  07/06/16 164 lb (74.4 kg)    Physical Exam  Constitutional: She is oriented to person, place, and time. No distress.  HENT:  Mouth/Throat: No oropharyngeal exudate.  Neck: Normal range of motion. Neck supple.  Cardiovascular: Normal rate, regular rhythm and normal heart sounds.   Pulmonary/Chest: Effort normal. No respiratory distress. She has wheezes. She has rales.  Musculoskeletal: She exhibits edema. She exhibits no tenderness.  Lymphadenopathy:    She has no cervical adenopathy.  Neurological: She is alert and oriented to person, place, and time.  Skin: Skin is warm and dry. No rash noted. No erythema.  Vitals reviewed.   Lab  Results  Component Value Date   WBC 6.6 08/13/2014   HGB 13.4 08/13/2014   HCT 40.5 08/13/2014   PLT 250.0 08/13/2014   GLUCOSE 95 08/26/2015   CHOL 276 (H) 08/26/2015   TRIG 227.0 (H) 08/26/2015   HDL 46.70 08/26/2015   LDLDIRECT 197.0 08/26/2015   LDLCALC 125 (H) 08/13/2014   ALT 20 08/26/2015   AST 26 08/26/2015   NA 142 08/26/2015   K 4.8 08/26/2015   CL 105 08/26/2015   CREATININE 0.97 08/26/2015   BUN 18 08/26/2015   CO2 27 08/26/2015   TSH 2.71 08/26/2015   HGBA1C 5.4 07/04/2006    Dg Cervical Spine 2 Or 3 Views  Result Date: 04/17/2015 CLINICAL DATA:  Pain. No known injury. Pain radiates into right upper extremity . EXAM: CERVICAL SPINE - 2-3 VIEW COMPARISON:  Chest x-ray 07/13/2012 . FINDINGS: Diffuse osteopenia and multilevel degenerative change. Degenerative disc space loss and endplate osteophytes are most prominent at C5-C6 is C6-C7. Corticated lucency noted the base of C6 is most likely related to endplate osteophytes. Normal alignment. No evidence of fracture or dislocation. Left carotid vascular calcification noted consistent with atherosclerotic vascular disease. Biapical pleural thickening noted most consistent scarring . IMPRESSION: 1. Diffuse osteopenia degenerative change. No acute bony abnormality. 2. Carotid atherosclerotic vascular disease. Electronically Signed   By: Marcello Moores  Register   On: 04/17/2015 15:39   Dg Shoulder Right  Result Date: 04/17/2015 CLINICAL DATA:  Pain for 2 days. No known injury. Initial evaluation. EXAM: RIGHT SHOULDER - 2+ VIEW COMPARISON:  Chest x-ray 07/13/2012. FINDINGS: Acromioclavicular and glenohumeral degenerative change. Diffuse osteopenia No evidence of fracture or dislocation. No evidence of separation. IMPRESSION: Degenerative change in diffuse osteopenia.  No acute abnormality. Electronically Signed   By: Marcello Moores  Register   On: 04/17/2015 15:54    Assessment & Plan:   Deanna Olson was seen today for cough.  Diagnoses and all  orders for this visit:  Acute bronchitis, unspecified organism -     benzonatate (TESSALON) 100 MG capsule; Take 1 capsule (100 mg total) by mouth 3 (three) times daily as needed for cough. -     DG Chest 2 View; Future -     dextromethorphan-guaiFENesin (MUCINEX DM) 30-600 MG 12hr tablet; Take 1 tablet by mouth 2 (two) times daily as needed for cough. -     fluticasone (FLOVENT HFA) 110 MCG/ACT inhaler; Inhale 2 puffs into the lungs 2 (two) times daily. -     Ipratropium-Albuterol (COMBIVENT) 20-100 MCG/ACT AERS respimat; Inhale 1 puff into the lungs every 6 (six) hours.  Bilateral rales -     benzonatate (TESSALON) 100 MG capsule; Take 1 capsule (100 mg total) by mouth 3 (three) times daily as needed for cough. -     DG Chest 2 View; Future -     dextromethorphan-guaiFENesin ([place] DM) 30-600  MG 12hr tablet; Take 1 tablet by mouth 2 (two) times daily as needed for cough. -     fluticasone (FLOVENT HFA) 110 MCG/ACT inhaler; Inhale 2 puffs into the lungs 2 (two) times daily. -     Ipratropium-Albuterol (COMBIVENT) 20-100 MCG/ACT AERS respimat; Inhale 1 puff into the lungs every 6 (six) hours.   I have discontinued Ms. Biffle's predniSONE, doxycycline, and albuterol. I am also having her start on benzonatate, dextromethorphan-guaiFENesin, fluticasone, and Ipratropium-Albuterol. Additionally, I am having her maintain her clobetasol, desonide, Vitamin E, vitamin C, multivitamin with minerals, fluticasone, diclofenac sodium, pravastatin, levothyroxine, NEOMYCIN-POLYMYXIN-HYDROCORTISONE, fluconazole, clotrimazole-betamethasone, ketoconazole, omeprazole, lisinopril, and ALPRAZolam.  Meds ordered this encounter  Medications  . benzonatate (TESSALON) 100 MG capsule    Sig: Take 1 capsule (100 mg total) by mouth 3 (three) times daily as needed for cough.    Dispense:  20 capsule    Refill:  0    Order Specific Question:   Supervising Provider    Answer:   Cassandria Anger [[id number]]  .  dextromethorphan-guaiFENesin (MUCINEX DM) 30-600 MG 12hr tablet    Sig: Take 1 tablet by mouth 2 (two) times daily as needed for cough.    Dispense:  14 tablet    Refill:  0    Order Specific Question:   Supervising Provider    Answer:   Cassandria Anger [[id number]]  . fluticasone (FLOVENT HFA) 110 MCG/ACT inhaler    Sig: Inhale 2 puffs into the lungs 2 (two) times daily.    Dispense:  1 Inhaler    Refill:  12    Order Specific Question:   Supervising Provider    Answer:   Cassandria Anger [[id number]]  . Ipratropium-Albuterol (COMBIVENT) 20-100 MCG/ACT AERS respimat    Sig: Inhale 1 puff into the lungs every 6 (six) hours.    Dispense:  1 Inhaler    Refill:  0    Order Specific Question:   Supervising Provider    Answer:   Cassandria Anger [[id number]]    Follow-up: Return if symptoms worsen or fail to improve.  Wilfred Lacy, NP

## 2016-08-09 NOTE — Telephone Encounter (Signed)
Would recommend visit today if possible for re-evaluation.

## 2016-08-09 NOTE — Patient Instructions (Addendum)
CXR indicates acute on chronic bronchitis.

## 2016-08-09 NOTE — Telephone Encounter (Signed)
Patient has appointment today with Baldo Ash at 3:45pm.

## 2016-08-09 NOTE — Telephone Encounter (Signed)
Pt called stated she saw Dr. Sharlet Salina on 07/29/2016 and coughing and wheezing still not better. Please give her call back.

## 2016-08-09 NOTE — Telephone Encounter (Signed)
Please advise 

## 2016-08-09 NOTE — Telephone Encounter (Signed)
Do we have anything today

## 2016-08-16 NOTE — Progress Notes (Signed)
HPI The patient presents for follow-up of aortic stenosis. His was mild on echo in 2016. She returns for follow-up she is done well from a cardiovascular standpoint. She's had some bronchitis that she's had a lot of trouble getting over. The bronchodilators interfere with her sleep. She's been frustrated because she's not been able to be as active. She's not noticing any cardiovascular issues such as palpitations, presyncope or syncope. Prior to the bronchitis she was not having any shortness of breath and is not describing any PND or orthopnea. She's had no weight gain or edema   Allergies  Allergen Reactions  . Sulfonamide Derivatives     REACTION: angioedema    Current Outpatient Prescriptions  Medication Sig Dispense Refill  . ALPRAZolam (XANAX) 0.25 MG tablet Take 1 tablet (0.25 mg total) by mouth 2 (two) times daily as needed. 60 tablet 1  . benzonatate (TESSALON) 100 MG capsule Take 1 capsule (100 mg total) by mouth 3 (three) times daily as needed for cough. 20 capsule 0  . clobetasol (TEMOVATE) 0.05 % external solution Use as directed for scale itch    . clotrimazole-betamethasone (LOTRISONE) cream Apply 1 application topically 2 (two) times daily. 45 g 1  . desonide (DESOWEN) 0.05 % cream Apply 1 application topically 2 (two) times daily.    Marland Kitchen dextromethorphan-guaiFENesin (MUCINEX DM) 30-600 MG 12hr tablet Take 1 tablet by mouth 2 (two) times daily as needed for cough. 14 tablet 0  . diclofenac sodium (VOLTAREN) 1 % GEL Apply 2 g topically 3 (three) times daily as needed. 100 g 3  . fluconazole (DIFLUCAN) 150 MG tablet Take 1 tablet po once a week. Take each Saturday. 3 tablet 0  . fluticasone (FLONASE) 50 MCG/ACT nasal spray Place 2 sprays into both nostrils daily. 16 g 6  . fluticasone (FLOVENT HFA) 110 MCG/ACT inhaler Inhale 2 puffs into the lungs 2 (two) times daily. 1 Inhaler 12  . Ipratropium-Albuterol (COMBIVENT) 20-100 MCG/ACT AERS respimat Inhale 1 puff into the lungs  every 6 (six) hours. 1 Inhaler 0  . ketoconazole (NIZORAL) 2 % shampoo USE AS DIRECTED 120 mL 0  . levothyroxine (SYNTHROID, LEVOTHROID) 50 MCG tablet TAKE 1 TABLET BY MOUTH EVERY DAY 90 tablet 3  . lisinopril (PRINIVIL,ZESTRIL) 20 MG tablet TAKE 1 TABLET BY MOUTH EVERY DAY (NEEDS APPOINTMENT FOR MORE REFILLS) 90 tablet 0  . Multiple Vitamins-Minerals (MULTIVITAMIN WITH MINERALS) tablet Take 1 tablet by mouth daily.    . NEOMYCIN-POLYMYXIN-HYDROCORTISONE (CORTISPORIN) 1 % SOLN otic solution Place 3 drops into the left ear every 6 (six) hours. 10 mL 0  . omeprazole (PRILOSEC) 20 MG capsule Take 1 capsule (20 mg total) by mouth every morning. 90 capsule 1  . pravastatin (PRAVACHOL) 20 MG tablet Take 1 tablet (20 mg total) by mouth daily. 90 tablet 3  . PROAIR HFA 108 (90 Base) MCG/ACT inhaler INHALE 2 PUFFS INTO THE LUNGS EVERY 6 (SIX) HOURS AS NEEDED FOR WHEEZING OR SHORTNESS OF BREATH.  0  . vitamin C (ASCORBIC ACID) 500 MG tablet Take 500 mg by mouth daily.    . Vitamin E 400 UNITS TABS Take 1 tablet by mouth daily.     No current facility-administered medications for this visit.     Past Medical History:  Diagnosis Date  . Allergic rhinitis, cause unspecified   . Anxiety state, unspecified   . Aortic valve disorders    Mild AS 2013 echo   . Eczema   . Esophageal reflux   .  Herpes zoster without mention of complication   . Hyperlipidemia   . Hypertension   . Hypothyroid   . Osteoarthrosis, unspecified whether generalized or localized, unspecified site   . Unspecified venous (peripheral) insufficiency     Past Surgical History:  Procedure Laterality Date  . CATARACT EXTRACTION, BILATERAL  2013  . VESICOVAGINAL FISTULA CLOSURE W/ TAH      ROS:  Difficulty sleeping.  Otherwise, as stated in the HPI and negative for all other systems.  PHYSICAL EXAM BP (!) 168/86   Pulse 71   Ht 5\' 4"  (1.626 m)   Wt 161 lb (73 kg)   BMI 27.64 kg/m  GENERAL:  Well appearing NECK:  No  jugular venous distention, waveform within normal limits, carotid upstroke brisk and symmetric, positive soft bilateral bruits versus transmitted systolic murmur , no thyromegaly LUNGS:  Clear to auscultation bilaterally CHEST:  Unremarkable  HEART:  PMI not displaced or sustained,S1 and S2 within normal limits, no S3, no S4, no clicks, no rubs, 3/6 mid peaking apical systolic murmur radiating out the aortic outflow tract, no diastolic smurmurs ABD:  Flat, positive bowel sounds normal in frequency in pitch, no bruits, no rebound, no guarding, no midline pulsatile mass, no hepatomegaly, no splenomegaly EXT:  2 plus pulses throughout, no edema, no cyanosis no clubbing   EKG:  Sinus rhythm rate 71, axis within normal limits, left bundle branch block, no change from previous.  PACs. 08/17/2016   ASSESSMENT AND PLAN  LBBB:  This is chronic.  No further work is indicated.    HTN:The blood pressure is at target. No change in medications is indicated. We will continue with therapeutic lifestyle changes (TLC).  Aortic stenosis: This was mild last year and I would not suspect that it has progressed and no further imaging is indicated.

## 2016-08-17 ENCOUNTER — Encounter: Payer: Self-pay | Admitting: Cardiology

## 2016-08-17 ENCOUNTER — Ambulatory Visit (INDEPENDENT_AMBULATORY_CARE_PROVIDER_SITE_OTHER): Payer: PPO | Admitting: Cardiology

## 2016-08-17 VITALS — BP 168/86 | HR 71 | Ht 64.0 in | Wt 161.0 lb

## 2016-08-17 DIAGNOSIS — I350 Nonrheumatic aortic (valve) stenosis: Secondary | ICD-10-CM

## 2016-08-17 DIAGNOSIS — I10 Essential (primary) hypertension: Secondary | ICD-10-CM | POA: Diagnosis not present

## 2016-08-17 NOTE — Patient Instructions (Signed)

## 2016-09-03 ENCOUNTER — Other Ambulatory Visit: Payer: Self-pay | Admitting: Internal Medicine

## 2016-09-11 ENCOUNTER — Other Ambulatory Visit: Payer: Self-pay | Admitting: Internal Medicine

## 2016-09-13 ENCOUNTER — Other Ambulatory Visit: Payer: Self-pay | Admitting: Internal Medicine

## 2016-09-20 ENCOUNTER — Other Ambulatory Visit: Payer: Self-pay | Admitting: Internal Medicine

## 2016-09-20 NOTE — Telephone Encounter (Signed)
Filled in Jan for 90 days with note saying needs visit for refills, hasnt had physical. Is this ok to fill? Please advise

## 2016-09-20 NOTE — Telephone Encounter (Signed)
Ok to fill 

## 2016-09-29 ENCOUNTER — Other Ambulatory Visit: Payer: Self-pay | Admitting: Internal Medicine

## 2016-10-10 ENCOUNTER — Other Ambulatory Visit: Payer: Self-pay | Admitting: Internal Medicine

## 2016-10-13 ENCOUNTER — Other Ambulatory Visit: Payer: Self-pay | Admitting: Internal Medicine

## 2016-10-20 ENCOUNTER — Ambulatory Visit (INDEPENDENT_AMBULATORY_CARE_PROVIDER_SITE_OTHER): Payer: PPO | Admitting: Internal Medicine

## 2016-10-20 ENCOUNTER — Encounter: Payer: Self-pay | Admitting: Internal Medicine

## 2016-10-20 ENCOUNTER — Other Ambulatory Visit (INDEPENDENT_AMBULATORY_CARE_PROVIDER_SITE_OTHER): Payer: PPO

## 2016-10-20 VITALS — BP 140/76 | HR 63 | Temp 97.6°F | Resp 12 | Ht 64.0 in | Wt 160.0 lb

## 2016-10-20 DIAGNOSIS — E785 Hyperlipidemia, unspecified: Secondary | ICD-10-CM | POA: Diagnosis not present

## 2016-10-20 DIAGNOSIS — I10 Essential (primary) hypertension: Secondary | ICD-10-CM

## 2016-10-20 DIAGNOSIS — F411 Generalized anxiety disorder: Secondary | ICD-10-CM | POA: Diagnosis not present

## 2016-10-20 DIAGNOSIS — Z0000 Encounter for general adult medical examination without abnormal findings: Secondary | ICD-10-CM

## 2016-10-20 DIAGNOSIS — E039 Hypothyroidism, unspecified: Secondary | ICD-10-CM

## 2016-10-20 LAB — LIPID PANEL
Cholesterol: 233 mg/dL — ABNORMAL HIGH (ref 0–200)
HDL: 47.4 mg/dL (ref >39.00)
LDL Cholesterol: 151 mg/dL — ABNORMAL HIGH (ref 0–99)
NonHDL: 185.77
Total CHOL/HDL Ratio: 5
Triglycerides: 176 mg/dL — ABNORMAL HIGH (ref 0.0–149.0)
VLDL: 35.2 mg/dL (ref 0.0–40.0)

## 2016-10-20 LAB — COMPREHENSIVE METABOLIC PANEL
ALT: 14 U/L (ref 0–35)
AST: 20 U/L (ref 0–37)
Albumin: 4.3 g/dL (ref 3.5–5.2)
Alkaline Phosphatase: 50 U/L (ref 39–117)
BUN: 19 mg/dL (ref 6–23)
CHLORIDE: 107 meq/L (ref 96–112)
CO2: 27 meq/L (ref 19–32)
CREATININE: 0.99 mg/dL (ref 0.40–1.20)
Calcium: 10 mg/dL (ref 8.4–10.5)
GFR: 56.17 mL/min — ABNORMAL LOW (ref >60.00)
Glucose, Bld: 94 mg/dL (ref 70–99)
POTASSIUM: 4.6 meq/L (ref 3.5–5.1)
SODIUM: 142 meq/L (ref 135–145)
Total Bilirubin: 0.5 mg/dL (ref 0.2–1.2)
Total Protein: 7.3 g/dL (ref 6.0–8.3)

## 2016-10-20 LAB — T4, FREE: FREE T4: 0.65 ng/dL (ref 0.60–1.60)

## 2016-10-20 LAB — TSH: TSH: 3.07 u[IU]/mL (ref 0.35–4.50)

## 2016-10-20 NOTE — Progress Notes (Signed)
   Subjective:    Patient ID: Deanna Olson, female    DOB: Oct 13, 1925, 83 y.o.   MRN: [id number]  HPI Here for medicare wellness and physical, no new complaints. Please see A/P for status and treatment of chronic medical problems.   Diet: heart healthy Physical activity: sedentary but busy Depression/mood screen: negative Hearing: intact to whispered voice, mild loss bilaterally Visual acuity: grossly normal with lens, performs annual eye exam  ADLs: capable Fall risk: low Home safety: good Cognitive evaluation: intact to orientation, naming, recall and repetition EOL planning: adv directives discussed  I have personally reviewed and have noted 1. The patient's medical and social history - reviewed today no changes 2. Their use of alcohol, tobacco or illicit drugs 3. Their current medications and supplements 4. The patient's functional ability including ADL's, fall risks, home safety risks and hearing or visual impairment. 5. Diet and physical activities 6. Evidence for depression or mood disorders 7. Care team reviewed and updated (available in snapshot)  Review of Systems  Constitutional: Negative.   HENT: Negative.   Eyes: Negative.   Respiratory: Negative for cough, chest tightness and shortness of breath.   Cardiovascular: Negative for chest pain, palpitations and leg swelling.  Gastrointestinal: Negative for abdominal distention, abdominal pain, constipation, diarrhea, nausea and vomiting.  Musculoskeletal: Positive for arthralgias. Negative for back pain, gait problem, joint swelling and neck pain.  Skin: Negative.   Neurological: Negative.   Psychiatric/Behavioral: Negative.       Objective:   Physical Exam  Constitutional: She is oriented to person, place, and time. She appears well-developed and well-nourished.  HENT:  Head: Normocephalic and atraumatic.  Eyes: EOM are normal.  Neck: Normal range of motion.  Cardiovascular: Normal rate and regular rhythm.     Pulmonary/Chest: Effort normal and breath sounds normal. No respiratory distress. She has no wheezes. She has no rales.  Abdominal: Soft. Bowel sounds are normal. She exhibits no distension. There is no tenderness. There is no rebound.  Musculoskeletal: She exhibits no edema.  Neurological: She is alert and oriented to person, place, and time. Coordination normal.  Skin: Skin is warm and dry.  Psychiatric: She has a normal mood and affect.   Vitals:   10/20/16 0826  BP: 140/76  Pulse: 63  Resp: 12  Temp: 97.6 F (36.4 C)  TempSrc: Oral  SpO2: 99%  Weight: 160 lb (72.6 kg)  Height: 5\' 4"  (1.626 m)      Assessment & Plan:

## 2016-10-20 NOTE — Assessment & Plan Note (Addendum)
Taking pravastatin 20 mg daily. Checking lipid panel and adjust for LDL <130.

## 2016-10-20 NOTE — Progress Notes (Signed)
Pre visit review using our clinic review tool, if applicable. No additional management support is needed unless otherwise documented below in the visit note. 

## 2016-10-20 NOTE — Assessment & Plan Note (Signed)
Declines tetanus shot today and thinks she has had dexa and does not want another now. She is up to date on flu and pneumonia immunization. Counseled about sun safety and mole surveillance as well as home safety. Given 10 year screening recommendations.

## 2016-10-20 NOTE — Patient Instructions (Signed)
We will check the labs today and call you back with the results.   Health Maintenance, Female Adopting a healthy lifestyle and getting preventive care can go a long way to promote health and wellness. Talk with your health care provider about what schedule of regular examinations is right for you. This is a good chance for you to check in with your provider about disease prevention and staying healthy. In between checkups, there are plenty of things you can do on your own. Experts have done a lot of research about which lifestyle changes and preventive measures are most likely to keep you healthy. Ask your health care provider for more information. Weight and diet Eat a healthy diet  Be sure to include plenty of vegetables, fruits, low-fat dairy products, and lean protein.  Do not eat a lot of foods high in solid fats, added sugars, or salt.  Get regular exercise. This is one of the most important things you can do for your health.  Most adults should exercise for at least 150 minutes each week. The exercise should increase your heart rate and make you sweat (moderate-intensity exercise).  Most adults should also do strengthening exercises at least twice a week. This is in addition to the moderate-intensity exercise. Maintain a healthy weight  Body mass index (BMI) is a measurement that can be used to identify possible weight problems. It estimates body fat based on height and weight. Your health care provider can help determine your BMI and help you achieve or maintain a healthy weight.  For females 61 years of age and older:  A BMI below 18.5 is considered underweight.  A BMI of 18.5 to 24.9 is normal.  A BMI of 25 to 29.9 is considered overweight.  A BMI of 30 and above is considered obese. Watch levels of cholesterol and blood lipids  You should start having your blood tested for lipids and cholesterol at 81 years of age, then have this test every 5 years.  You may need to have  your cholesterol levels checked more often if:  Your lipid or cholesterol levels are high.  You are older than 81 years of age.  You are at high risk for heart disease. Cancer screening Lung Cancer  Lung cancer screening is recommended for adults 38-12 years old who are at high risk for lung cancer because of a history of smoking.  A yearly low-dose CT scan of the lungs is recommended for people who:  Currently smoke.  Have quit within the past 15 years.  Have at least a 30-pack-year history of smoking. A pack year is smoking an average of one pack of cigarettes a day for 1 year.  Yearly screening should continue until it has been 15 years since you quit.  Yearly screening should stop if you develop a health problem that would prevent you from having lung cancer treatment. Breast Cancer  Practice breast self-awareness. This means understanding how your breasts normally appear and feel.  It also means doing regular breast self-exams. Let your health care provider know about any changes, no matter how small.  If you are in your 20s or 30s, you should have a clinical breast exam (CBE) by a health care provider every 1-3 years as part of a regular health exam.  If you are 71 or older, have a CBE every year. Also consider having a breast X-ray (mammogram) every year.  If you have a family history of breast cancer, talk to your health care  provider about genetic screening.  If you are at high risk for breast cancer, talk to your health care provider about having an MRI and a mammogram every year.  Breast cancer gene (BRCA) assessment is recommended for women who have family members with BRCA-related cancers. BRCA-related cancers include:  Breast.  Ovarian.  Tubal.  Peritoneal cancers.  Results of the assessment will determine the need for genetic counseling and BRCA1 and BRCA2 testing. Cervical Cancer  Your health care provider may recommend that you be screened regularly  for cancer of the pelvic organs (ovaries, uterus, and vagina). This screening involves a pelvic examination, including checking for microscopic changes to the surface of your cervix (Pap test). You may be encouraged to have this screening done every 3 years, beginning at age 21.  For women ages 30-65, health care providers may recommend pelvic exams and Pap testing every 3 years, or they may recommend the Pap and pelvic exam, combined with testing for human papilloma virus (HPV), every 5 years. Some types of HPV increase your risk of cervical cancer. Testing for HPV may also be done on women of any age with unclear Pap test results.  Other health care providers may not recommend any screening for nonpregnant women who are considered low risk for pelvic cancer and who do not have symptoms. Ask your health care provider if a screening pelvic exam is right for you.  If you have had past treatment for cervical cancer or a condition that could lead to cancer, you need Pap tests and screening for cancer for at least 20 years after your treatment. If Pap tests have been discontinued, your risk factors (such as having a new sexual partner) need to be reassessed to determine if screening should resume. Some women have medical problems that increase the chance of getting cervical cancer. In these cases, your health care provider may recommend more frequent screening and Pap tests. Colorectal Cancer  This type of cancer can be detected and often prevented.  Routine colorectal cancer screening usually begins at 81 years of age and continues through 81 years of age.  Your health care provider may recommend screening at an earlier age if you have risk factors for colon cancer.  Your health care provider may also recommend using home test kits to check for hidden blood in the stool.  A small camera at the end of a tube can be used to examine your colon directly (sigmoidoscopy or colonoscopy). This is done to check  for the earliest forms of colorectal cancer.  Routine screening usually begins at age 50.  Direct examination of the colon should be repeated every 5-10 years through 81 years of age. However, you may need to be screened more often if early forms of precancerous polyps or small growths are found. Skin Cancer  Check your skin from head to toe regularly.  Tell your health care provider about any new moles or changes in moles, especially if there is a change in a mole's shape or color.  Also tell your health care provider if you have a mole that is larger than the size of a pencil eraser.  Always use sunscreen. Apply sunscreen liberally and repeatedly throughout the day.  Protect yourself by wearing long sleeves, pants, a wide-brimmed hat, and sunglasses whenever you are outside. Heart disease, diabetes, and high blood pressure  High blood pressure causes heart disease and increases the risk of stroke. High blood pressure is more likely to develop in:  People who   have blood pressure in the high end of the normal range (130-139/85-89 mm Hg).  People who are overweight or obese.  People who are African American.  If you are 66-44 years of age, have your blood pressure checked every 3-5 years. If you are 81 years of age or older, have your blood pressure checked every year. You should have your blood pressure measured twice-once when you are at a hospital or clinic, and once when you are not at a hospital or clinic. Record the average of the two measurements. To check your blood pressure when you are not at a hospital or clinic, you can use:  An automated blood pressure machine at a pharmacy.  A home blood pressure monitor.  If you are between 53 years and 57 years old, ask your health care provider if you should take aspirin to prevent strokes.  Have regular diabetes screenings. This involves taking a blood sample to check your fasting blood sugar level.  If you are at a normal weight  and have a low risk for diabetes, have this test once every three years after 81 years of age.  If you are overweight and have a high risk for diabetes, consider being tested at a younger age or more often. Preventing infection Hepatitis B  If you have a higher risk for hepatitis B, you should be screened for this virus. You are considered at high risk for hepatitis B if:  You were born in a country where hepatitis B is common. Ask your health care provider which countries are considered high risk.  Your parents were born in a high-risk country, and you have not been immunized against hepatitis B (hepatitis B vaccine).  You have HIV or AIDS.  You use needles to inject street drugs.  You live with someone who has hepatitis B.  You have had sex with someone who has hepatitis B.  You get hemodialysis treatment.  You take certain medicines for conditions, including cancer, organ transplantation, and autoimmune conditions. Hepatitis C  Blood testing is recommended for:  Everyone born from [birth year] through 1963.  Anyone with known risk factors for hepatitis C. Sexually transmitted infections (STIs)  You should be screened for sexually transmitted infections (STIs) including gonorrhea and chlamydia if:  You are sexually active and are younger than 81 years of age.  You are older than 81 years of age and your health care provider tells you that you are at risk for this type of infection.  Your sexual activity has changed since you were last screened and you are at an increased risk for chlamydia or gonorrhea. Ask your health care provider if you are at risk.  If you do not have HIV, but are at risk, it may be recommended that you take a prescription medicine daily to prevent HIV infection. This is called pre-exposure prophylaxis (PrEP). You are considered at risk if:  You are sexually active and do not regularly use condoms or know the HIV status of your partner(s).  You take drugs by  injection.  You are sexually active with a partner who has HIV. Talk with your health care provider about whether you are at high risk of being infected with HIV. If you choose to begin PrEP, you should first be tested for HIV. You should then be tested every 3 months for as long as you are taking PrEP. Pregnancy  If you are premenopausal and you may become pregnant, ask your health care provider about preconception counseling.  If you may become pregnant, take 400 to 800 micrograms (mcg) of folic acid every day.  If you want to prevent pregnancy, talk to your health care provider about birth control (contraception). Osteoporosis and menopause  Osteoporosis is a disease in which the bones lose minerals and strength with aging. This can result in serious bone fractures. Your risk for osteoporosis can be identified using a bone density scan.  If you are 37 years of age or older, or if you are at risk for osteoporosis and fractures, ask your health care provider if you should be screened.  Ask your health care provider whether you should take a calcium or vitamin D supplement to lower your risk for osteoporosis.  Menopause may have certain physical symptoms and risks.  Hormone replacement therapy may reduce some of these symptoms and risks. Talk to your health care provider about whether hormone replacement therapy is right for you. Follow these instructions at home:  Schedule regular health, dental, and eye exams.  Stay current with your immunizations.  Do not use any tobacco products including cigarettes, chewing tobacco, or electronic cigarettes.  If you are pregnant, do not drink alcohol.  If you are breastfeeding, limit how much and how often you drink alcohol.  Limit alcohol intake to no more than 1 drink per day for nonpregnant women. One drink equals 12 ounces of beer, 5 ounces of wine, or 1 ounces of hard liquor.  Do not use street drugs.  Do not share needles.  Ask  your health care provider for help if you need support or information about quitting drugs.  Tell your health care provider if you often feel depressed.  Tell your health care provider if you have ever been abused or do not feel safe at home. This information is not intended to replace advice given to you by your health care provider. Make sure you discuss any questions you have with your health care provider. Document Released: 12/14/2010 Document Revised: 11/06/2015 Document Reviewed: 03/04/2015 Elsevier Interactive Patient Education  2017 [employer].

## 2016-10-20 NOTE — Assessment & Plan Note (Signed)
BP at goal on lisinopril 20 mg daily currently and she is checking at home mostly 130s/80s at home. No symptoms.

## 2016-10-20 NOTE — Assessment & Plan Note (Signed)
Checking TSH and free T4 and adjust synthroid 50 mcg daily as needed. No symptoms of over or under replacement.

## 2016-10-20 NOTE — Assessment & Plan Note (Signed)
Using xanax for sleep and mood is good. No depression or generalized anxiety. Has good social support group and family in the area.

## 2016-10-25 ENCOUNTER — Other Ambulatory Visit: Payer: Self-pay | Admitting: Internal Medicine

## 2016-10-28 ENCOUNTER — Telehealth: Payer: Self-pay | Admitting: Internal Medicine

## 2016-10-28 MED ORDER — ALPRAZOLAM 0.25 MG PO TABS: 0.2500 mg | ORAL_TABLET | Freq: Two times a day (BID) | ORAL | 1 refills | Status: DC | PRN

## 2016-10-28 NOTE — Telephone Encounter (Signed)
ALPRAZolam (XANAX) 0.25 MG tablet   Patient called office requesting a refill on this medication. Patient states she would like it refilled today, she will be out by the weekend. Please advise. Thank you.

## 2016-10-28 NOTE — Telephone Encounter (Signed)
Pt was informed.

## 2016-10-28 NOTE — Telephone Encounter (Signed)
Printed and signed.  

## 2016-10-28 NOTE — Telephone Encounter (Signed)
Please advise 

## 2016-12-21 ENCOUNTER — Other Ambulatory Visit: Payer: Self-pay | Admitting: Internal Medicine

## 2016-12-23 ENCOUNTER — Other Ambulatory Visit: Payer: Self-pay | Admitting: Internal Medicine

## 2016-12-23 ENCOUNTER — Telehealth: Payer: Self-pay | Admitting: Internal Medicine

## 2016-12-23 NOTE — Telephone Encounter (Signed)
Ive already sent to Marya Amsler to approve, I received refill request from pharmacy

## 2016-12-23 NOTE — Telephone Encounter (Signed)
Pt needs refill on xanex  [facility]

## 2016-12-23 NOTE — Telephone Encounter (Signed)
Last dispensed 11/25/2016 30 tabs for 30 days

## 2017-01-04 ENCOUNTER — Other Ambulatory Visit: Payer: Self-pay | Admitting: Internal Medicine

## 2017-01-27 ENCOUNTER — Ambulatory Visit (INDEPENDENT_AMBULATORY_CARE_PROVIDER_SITE_OTHER): Payer: PPO | Admitting: Family

## 2017-01-27 ENCOUNTER — Encounter: Payer: Self-pay | Admitting: Family

## 2017-01-27 VITALS — BP 130/68 | HR 74 | Temp 98.0°F | Resp 16 | Ht 64.0 in | Wt 155.4 lb

## 2017-01-27 DIAGNOSIS — R21 Rash and other nonspecific skin eruption: Secondary | ICD-10-CM | POA: Diagnosis not present

## 2017-01-27 MED ORDER — VALACYCLOVIR HCL 1 G PO TABS: 1000.0000 mg | ORAL_TABLET | Freq: Three times a day (TID) | ORAL | 0 refills | Status: DC

## 2017-01-27 MED ORDER — ALPRAZOLAM 0.25 MG PO TABS: ORAL_TABLET | ORAL | 1 refills | Status: DC

## 2017-01-27 MED ORDER — MUPIROCIN CALCIUM 2 % EX CREA: 1.0000 "application " | TOPICAL_CREAM | Freq: Two times a day (BID) | CUTANEOUS | 0 refills | Status: DC

## 2017-01-27 NOTE — Patient Instructions (Addendum)
Thank you for choosing [facility].  SUMMARY AND INSTRUCTIONS:  Start taking the Valtrex.  Can use the mupirocin cream to cover for infection.  Follow up if symptoms worsen or do not improve.   Follow up:  If your symptoms worsen or fail to improve, please contact our office for further instruction, or in case of emergency go directly to the emergency room at the closest medical facility.

## 2017-01-27 NOTE — Progress Notes (Addendum)
Subjective:    Patient ID: Deanna Olson, female    DOB: 10-12-1925, 83 y.o.   MRN: [id number]  Chief Complaint  Patient presents with  . Rash    has a rash behind and under both of her ears, the rash burns more than itching,    HPI:  Deanna Olson is a 82 y.o. female who  has a past medical history of Allergic rhinitis, cause unspecified; Anxiety state, unspecified; Aortic valve disorders; Eczema; Esophageal reflux; Herpes zoster without mention of complication; Hyperlipidemia; Hypertension; Hypothyroid; Osteoarthrosis, unspecified whether generalized or localized, unspecified site; and Unspecified venous (peripheral) insufficiency. and presents today for an acute office visit.  This is a new problem. Associated symptom of a rash located behind her bilateral ears has been going on for about 2.5 weeks. Modifying factors include a moisturizer cream that she was given at the pharmacy which has not helped very much. Course of the symptoms have gradually worsened.   Allergies  Allergen Reactions  . Sulfonamide Derivatives     REACTION: angioedema    Current Outpatient Prescriptions on File Prior to Visit  Medication Sig Dispense Refill  . clobetasol (TEMOVATE) 0.05 % external solution Use as directed for scale itch    . clotrimazole-betamethasone (LOTRISONE) cream Apply 1 application topically 2 (two) times daily. 45 g 1  . desonide (DESOWEN) 0.05 % cream Apply 1 application topically 2 (two) times daily.    . diclofenac sodium (VOLTAREN) 1 % GEL Apply 2 g topically 3 (three) times daily as needed. 100 g 3  . ketoconazole (NIZORAL) 2 % shampoo USE AS DIRECTED 120 mL 0  . levothyroxine (SYNTHROID, LEVOTHROID) 50 MCG tablet TAKE 1 TABLET BY MOUTH EVERY DAY 90 tablet 3  . lisinopril (PRINIVIL,ZESTRIL) 20 MG tablet Take 1 tablet (20 mg total) by mouth daily. 90 tablet 3  . Multiple Vitamins-Minerals (MULTIVITAMIN WITH MINERALS) tablet Take 1 tablet by mouth daily.    .  NEOMYCIN-POLYMYXIN-HYDROCORTISONE (CORTISPORIN) 1 % SOLN otic solution Place 3 drops into the left ear every 6 (six) hours. 10 mL 0  . omeprazole (PRILOSEC) 20 MG capsule TAKE ONE CAPSULE BY MOUTH EVERY MORNING 90 capsule 2  . pravastatin (PRAVACHOL) 20 MG tablet TAKE 1 TABLET BY MOUTH EVERY DAY 90 tablet 2  . vitamin C (ASCORBIC ACID) 500 MG tablet Take 500 mg by mouth daily.    . Vitamin E 400 UNITS TABS Take 1 tablet by mouth daily.     No current facility-administered medications on file prior to visit.     Past Medical History:  Diagnosis Date  . Allergic rhinitis, cause unspecified   . Anxiety state, unspecified   . Aortic valve disorders    Mild AS 2013 echo   . Eczema   . Esophageal reflux   . Herpes zoster without mention of complication   . Hyperlipidemia   . Hypertension   . Hypothyroid   . Osteoarthrosis, unspecified whether generalized or localized, unspecified site   . Unspecified venous (peripheral) insufficiency       Review of Systems  Constitutional: Negative for chills and fever.  HENT: Negative for ear discharge, ear pain and hearing loss.   Respiratory: Negative for chest tightness and shortness of breath.   Cardiovascular: Negative for chest pain, palpitations and leg swelling.  Skin: Positive for rash.      Objective:    BP 130/68 (BP Location: Right Arm, Patient Position: Sitting, Cuff Size: Normal)   Pulse 74   Temp 98 F (36.7  C) (Oral)   Resp 16   Ht 5\' 4"  (1.626 m)   Wt 155 lb 6.4 oz (70.5 kg)   SpO2 96%   BMI 26.67 kg/m  Nursing note and vital signs reviewed.  Physical Exam  Constitutional: She is oriented to person, place, and time. She appears well-developed and well-nourished. No distress.  HENT:  New-onset vesicular rash located around bilateral ears with redness and mild discharge. Questionable any color or appearance.  Cardiovascular: Normal rate, regular rhythm, normal heart sounds and intact distal pulses.   Pulmonary/Chest:  Effort normal and breath sounds normal.  Neurological: She is alert and oriented to person, place, and time.  Skin: Skin is warm and dry.  Psychiatric: She has a normal mood and affect. Her behavior is normal. Judgment and thought content normal.       Assessment & Plan:   Problem List Items Addressed This Visit      Musculoskeletal and Integument   Rash - Primary    New-onset rash with concern for possible herpes zoster. Start valacyclovir. If symptoms worsen or do not improve consider starting mupirocin cream. Follow-up with dermatology if no improvement.          I have changed Deanna Olson's ALPRAZolam. I am also having her start on valACYclovir and mupirocin cream. Additionally, I am having her maintain her clobetasol, desonide, Vitamin E, vitamin C, multivitamin with minerals, diclofenac sodium, NEOMYCIN-POLYMYXIN-HYDROCORTISONE, clotrimazole-betamethasone, pravastatin, omeprazole, levothyroxine, lisinopril, and ketoconazole.   Meds ordered this encounter  Medications  . valACYclovir (VALTREX) 1000 MG tablet    Sig: Take 1 tablet (1,000 mg total) by mouth 3 (three) times daily.    Dispense:  21 tablet    Refill:  0    Order Specific Question:   Supervising Provider    Answer:   Pricilla Holm A [[id number]]  . mupirocin cream (BACTROBAN) 2 %    Sig: Apply 1 application topically 2 (two) times daily.    Dispense:  15 g    Refill:  0    Order Specific Question:   Supervising Provider    Answer:   Pricilla Holm A [[id number]]  . ALPRAZolam (XANAX) 0.25 MG tablet    Sig: TAKE 1 TABLET BY MOUTH TWICE A DAY AS NEEDED **THIS IS A 30 DAY SUPPLY PER MD    Dispense:  30 tablet    Refill:  1    Fill on or after 12/25/16. Not to exceed 4 additional fills before 04/26/2017.    Order Specific Question:   Supervising Provider    Answer:   Pricilla Holm A [[id number]]     Follow-up: Return if symptoms worsen or fail to improve.  Mauricio Po, FNP

## 2017-01-27 NOTE — Assessment & Plan Note (Signed)
New-onset rash with concern for possible herpes zoster. Start valacyclovir. If symptoms worsen or do not improve consider starting mupirocin cream. Follow-up with dermatology if no improvement.

## 2017-02-28 ENCOUNTER — Telehealth: Payer: Self-pay | Admitting: Internal Medicine

## 2017-02-28 NOTE — Telephone Encounter (Signed)
Patient has called back in regard.  I did inform patient that we do ask for a 24 to 48 hour turn around.  I did offer to schedule patient an appt but she wanted to wait on response from [place].

## 2017-02-28 NOTE — Telephone Encounter (Signed)
Patient requesting refill on alprazolam to be sent to [facility].  [place] [phone].

## 2017-02-28 NOTE — Telephone Encounter (Signed)
Pt called in and said that her ear have flared up again.  She said that she was just her in aug and would like to talk to nurse about the meds she got back in aug and would like to see if they could be refilled?

## 2017-03-01 ENCOUNTER — Other Ambulatory Visit: Payer: Self-pay | Admitting: Family

## 2017-03-01 MED ORDER — VALACYCLOVIR HCL 1 G PO TABS: 1000.0000 mg | ORAL_TABLET | Freq: Three times a day (TID) | ORAL | 0 refills | Status: DC

## 2017-03-01 NOTE — Telephone Encounter (Signed)
Tried calling pt no answer LMOM w/Greg response.../lm,b

## 2017-03-01 NOTE — Telephone Encounter (Signed)
Not due for refill. Rx printed for 1 month with 1 refill on 01/27/17.

## 2017-03-01 NOTE — Telephone Encounter (Signed)
Medication sent to pharmacy. If the symptoms continue to return we will send her to dermatology.

## 2017-03-01 NOTE — Telephone Encounter (Signed)
As per previous phone note not due, 1 refill on rx from last month.

## 2017-03-01 NOTE — Telephone Encounter (Signed)
Check [name] last filled 01/26/17. Pt should have 1 refill left LMOM w/Greg response...Deanna Olson

## 2017-03-02 ENCOUNTER — Other Ambulatory Visit: Payer: Self-pay | Admitting: Family

## 2017-03-03 NOTE — Telephone Encounter (Signed)
Pt called asking why her Xanax has been denied and I informed her because she should still have a refill left, she said the pharmacy states she has no refills, She asked me to call the pharmacy and I did, they never received a Rx for xanax on the 16th. The tech stated they received a Rx for xanax on 8/15, but not 8/16. Can her xanax be resent?  Please advise

## 2017-03-03 NOTE — Telephone Encounter (Signed)
Called [facility] spoke w/Kelly pharmacist clarified if rx was received on 8/16 for the alprazolam. Per Claiborne Billings refill was not sent back. Per chart Marya Amsler approved on 8/16, but rx was not printed, he missed and hit no print. Virgie Dad verbal authorization from 01/27/17. Notified [facility].. .Deanna Olson

## 2017-03-03 NOTE — Telephone Encounter (Signed)
LVM notifying patient of Rx sent in.

## 2017-03-08 ENCOUNTER — Telehealth: Payer: Self-pay | Admitting: Family

## 2017-03-08 NOTE — Telephone Encounter (Signed)
Error

## 2017-03-10 ENCOUNTER — Ambulatory Visit (INDEPENDENT_AMBULATORY_CARE_PROVIDER_SITE_OTHER): Payer: PPO | Admitting: Internal Medicine

## 2017-03-10 ENCOUNTER — Encounter: Payer: Self-pay | Admitting: Internal Medicine

## 2017-03-10 VITALS — BP 130/80 | HR 60 | Temp 97.9°F | Ht 64.0 in | Wt 156.0 lb

## 2017-03-10 DIAGNOSIS — Z23 Encounter for immunization: Secondary | ICD-10-CM | POA: Diagnosis not present

## 2017-03-10 DIAGNOSIS — R21 Rash and other nonspecific skin eruption: Secondary | ICD-10-CM | POA: Diagnosis not present

## 2017-03-10 MED ORDER — CLOTRIMAZOLE-BETAMETHASONE 1-0.05 % EX CREA: 1.0000 "application " | TOPICAL_CREAM | Freq: Two times a day (BID) | CUTANEOUS | 1 refills | Status: DC

## 2017-03-10 NOTE — Patient Instructions (Signed)
We have sent in a new cream for the rash to use twice a day.   If you need to use something in between you can use vaseline.

## 2017-03-11 NOTE — Assessment & Plan Note (Signed)
Referral to dermatology and rx for lotrisone cream. Does not appear to be shingles as the rash did not respond to valtrex.

## 2017-03-11 NOTE — Progress Notes (Signed)
   Subjective:    Patient ID: Deanna Olson, female    DOB: December 13, 1925, 83 y.o.   MRN: [id number]  HPI The patient is an 83 YO female coming in for rash which is not improved on her ears. She was seen about 1 month ago and given cortisone and bacitracin cream to use on the area. This has not helped. They were concerned about shingles and she has done several course of valtrex. It initially helped she thinks but rash is not changes. Is swelling some around her ears. No pain but some tingling with the swelling. Not on her face or arms or legs. No sun exposure. No change in medications or supplements before rash appeared. She is scratching some due to the itching.   Review of Systems  Constitutional: Negative.   HENT: Positive for ear pain. Negative for ear discharge, mouth sores, sinus pain, sinus pressure and sneezing.   Respiratory: Negative for cough, chest tightness and shortness of breath.   Cardiovascular: Negative for chest pain, palpitations and leg swelling.  Gastrointestinal: Negative for abdominal distention, abdominal pain, constipation, diarrhea, nausea and vomiting.  Musculoskeletal: Negative.   Skin: Positive for rash.  Neurological: Negative.   Psychiatric/Behavioral: Negative.       Objective:   Physical Exam  Constitutional: She is oriented to person, place, and time. She appears well-developed and well-nourished.  HENT:  Head: Normocephalic and atraumatic.  Rash on the ears externally and in the ear canal. Some swelling of the ear lobes. Mild stigmata of scratching but no skin breakdown.   Eyes: EOM are normal.  Neck: Normal range of motion.  Cardiovascular: Normal rate and regular rhythm.   Pulmonary/Chest: Effort normal and breath sounds normal. No respiratory distress. She has no wheezes. She has no rales.  Abdominal: Soft. Bowel sounds are normal. She exhibits no distension. There is no tenderness. There is no rebound.  Musculoskeletal: She exhibits no edema.    Neurological: She is alert and oriented to person, place, and time. Coordination normal.  Skin: Skin is warm and dry. Rash noted.   Vitals:   03/10/17 1407  BP: 130/80  Pulse: 60  Temp: 97.9 F (36.6 C)  TempSrc: Oral  SpO2: 99%  Weight: 156 lb (70.8 kg)  Height: 5\' 4"  (1.626 m)      Assessment & Plan:  Flu shot given at visit

## 2017-04-04 ENCOUNTER — Telehealth: Payer: Self-pay | Admitting: Internal Medicine

## 2017-04-04 MED ORDER — ALPRAZOLAM 0.25 MG PO TABS: ORAL_TABLET | ORAL | 1 refills | Status: DC

## 2017-04-04 NOTE — Telephone Encounter (Signed)
Faxed to pharmacy

## 2017-04-04 NOTE — Telephone Encounter (Signed)
Pt called for a refill of her ALPRAZolam (XANAX) 0.25 MG tablet  [facility] [address]  Please advise

## 2017-05-02 ENCOUNTER — Telehealth: Payer: Self-pay | Admitting: Internal Medicine

## 2017-05-02 NOTE — Telephone Encounter (Signed)
Should have refill at pharmacy and should call them.

## 2017-05-02 NOTE — Telephone Encounter (Signed)
Pt called for a refill of her ALPRAZolam (XANAX) 0.25 MG tablet Please advise

## 2017-05-02 NOTE — Telephone Encounter (Signed)
Notified pt w/Md response.../lmb 

## 2017-05-18 DIAGNOSIS — D485 Neoplasm of uncertain behavior of skin: Secondary | ICD-10-CM | POA: Diagnosis not present

## 2017-05-18 DIAGNOSIS — L603 Nail dystrophy: Secondary | ICD-10-CM | POA: Diagnosis not present

## 2017-05-18 DIAGNOSIS — L570 Actinic keratosis: Secondary | ICD-10-CM | POA: Diagnosis not present

## 2017-05-18 DIAGNOSIS — D225 Melanocytic nevi of trunk: Secondary | ICD-10-CM | POA: Diagnosis not present

## 2017-05-18 DIAGNOSIS — L218 Other seborrheic dermatitis: Secondary | ICD-10-CM | POA: Diagnosis not present

## 2017-05-18 DIAGNOSIS — Z85828 Personal history of other malignant neoplasm of skin: Secondary | ICD-10-CM | POA: Diagnosis not present

## 2017-06-03 ENCOUNTER — Telehealth: Payer: Self-pay | Admitting: Internal Medicine

## 2017-06-03 MED ORDER — ALPRAZOLAM 0.25 MG PO TABS: ORAL_TABLET | ORAL | 5 refills | Status: DC

## 2017-06-03 NOTE — Telephone Encounter (Signed)
Pt requesting xanax 

## 2017-06-03 NOTE — Telephone Encounter (Signed)
Notified pt rx sent to pharmacy../lmb 

## 2017-06-03 NOTE — Telephone Encounter (Signed)
Copied from [place] [id number]. Topic: Quick Communication - Rx Refill/Question >> Jun 03, 2017 10:09 AM Yvette Rack wrote: Has the patient contacted their pharmacy? Yes.     (Agent: If no, request that the patient contact the pharmacy for the refill.) ALPRAZolam (XANAX) 0.25 MG tablet  patient has one pill left   Preferred Pharmacy (with phone number or street name): [id number] [address], [place] [phone] (Phone) [phone] (Fax)     Agent: Please be advised that RX refills may take up to 3 business days. We ask that you follow-up with your pharmacy.

## 2017-06-03 NOTE — Telephone Encounter (Signed)
Sent in

## 2017-06-03 NOTE — Telephone Encounter (Signed)
Check [place] registry last filled 05/02/2017.Marland KitchenJohny Chess

## 2017-06-22 ENCOUNTER — Other Ambulatory Visit: Payer: Self-pay | Admitting: Internal Medicine

## 2017-07-06 DIAGNOSIS — L218 Other seborrheic dermatitis: Secondary | ICD-10-CM | POA: Diagnosis not present

## 2017-07-06 DIAGNOSIS — L570 Actinic keratosis: Secondary | ICD-10-CM | POA: Diagnosis not present

## 2017-07-15 IMAGING — DX DG SHOULDER 2+V*R*
3 series · 3 of 3 positions shown · non-contrast
Comparison: Chest x-ray 07/13/2012.

CLINICAL DATA: Pain for 2 days. No known injury. Initial
evaluation.

EXAM:
RIGHT SHOULDER - 2+ VIEW

[grashey]
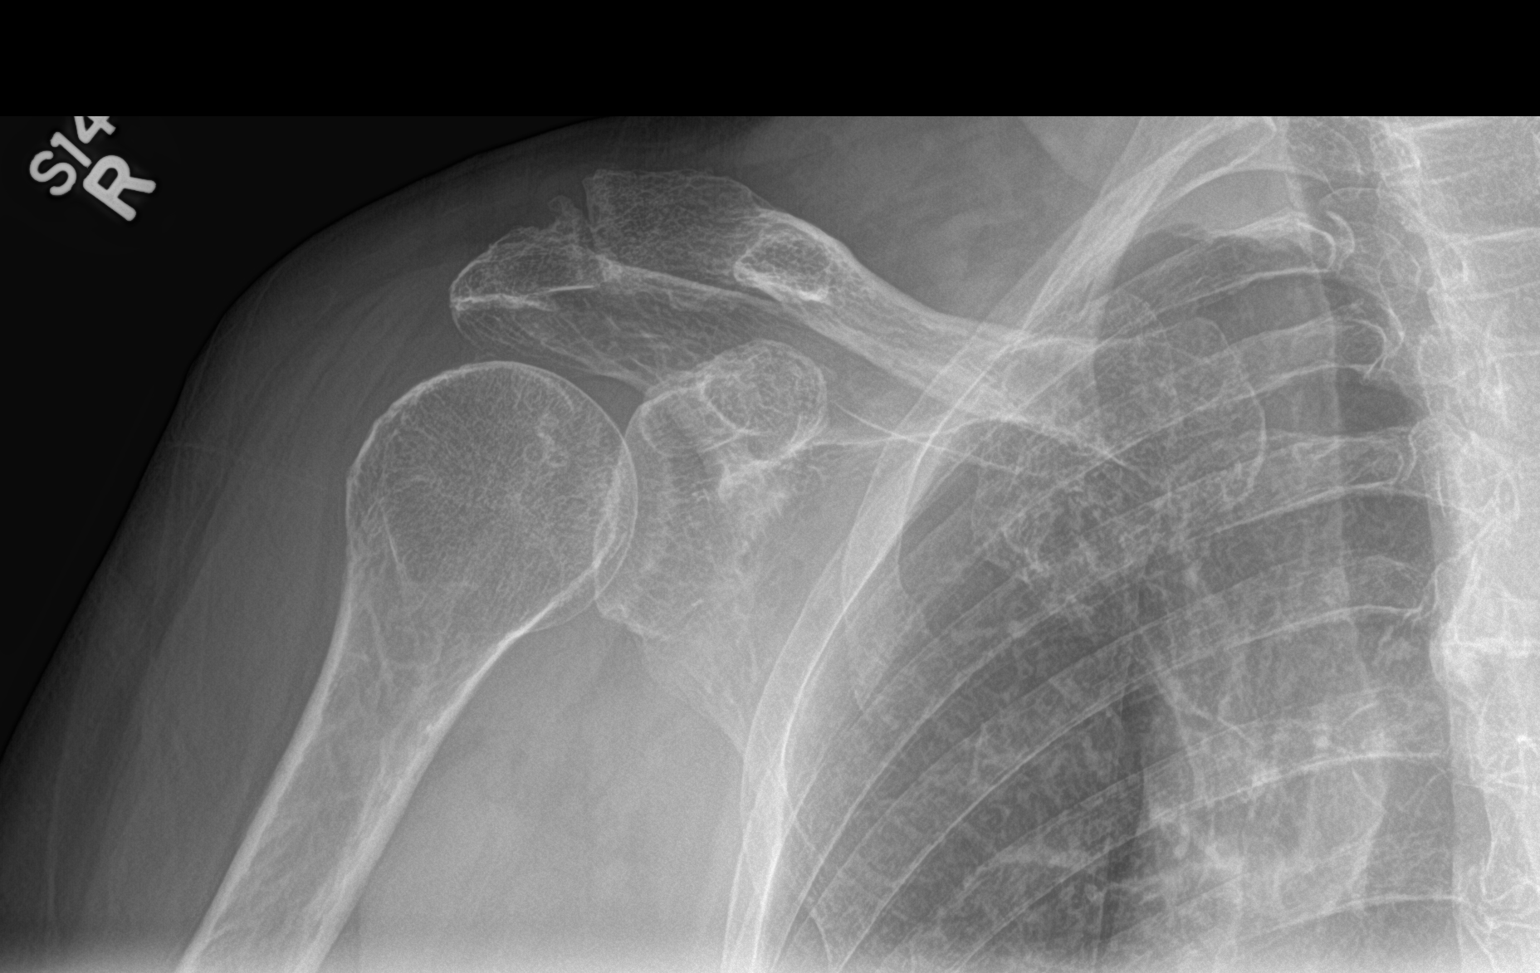

[y view]
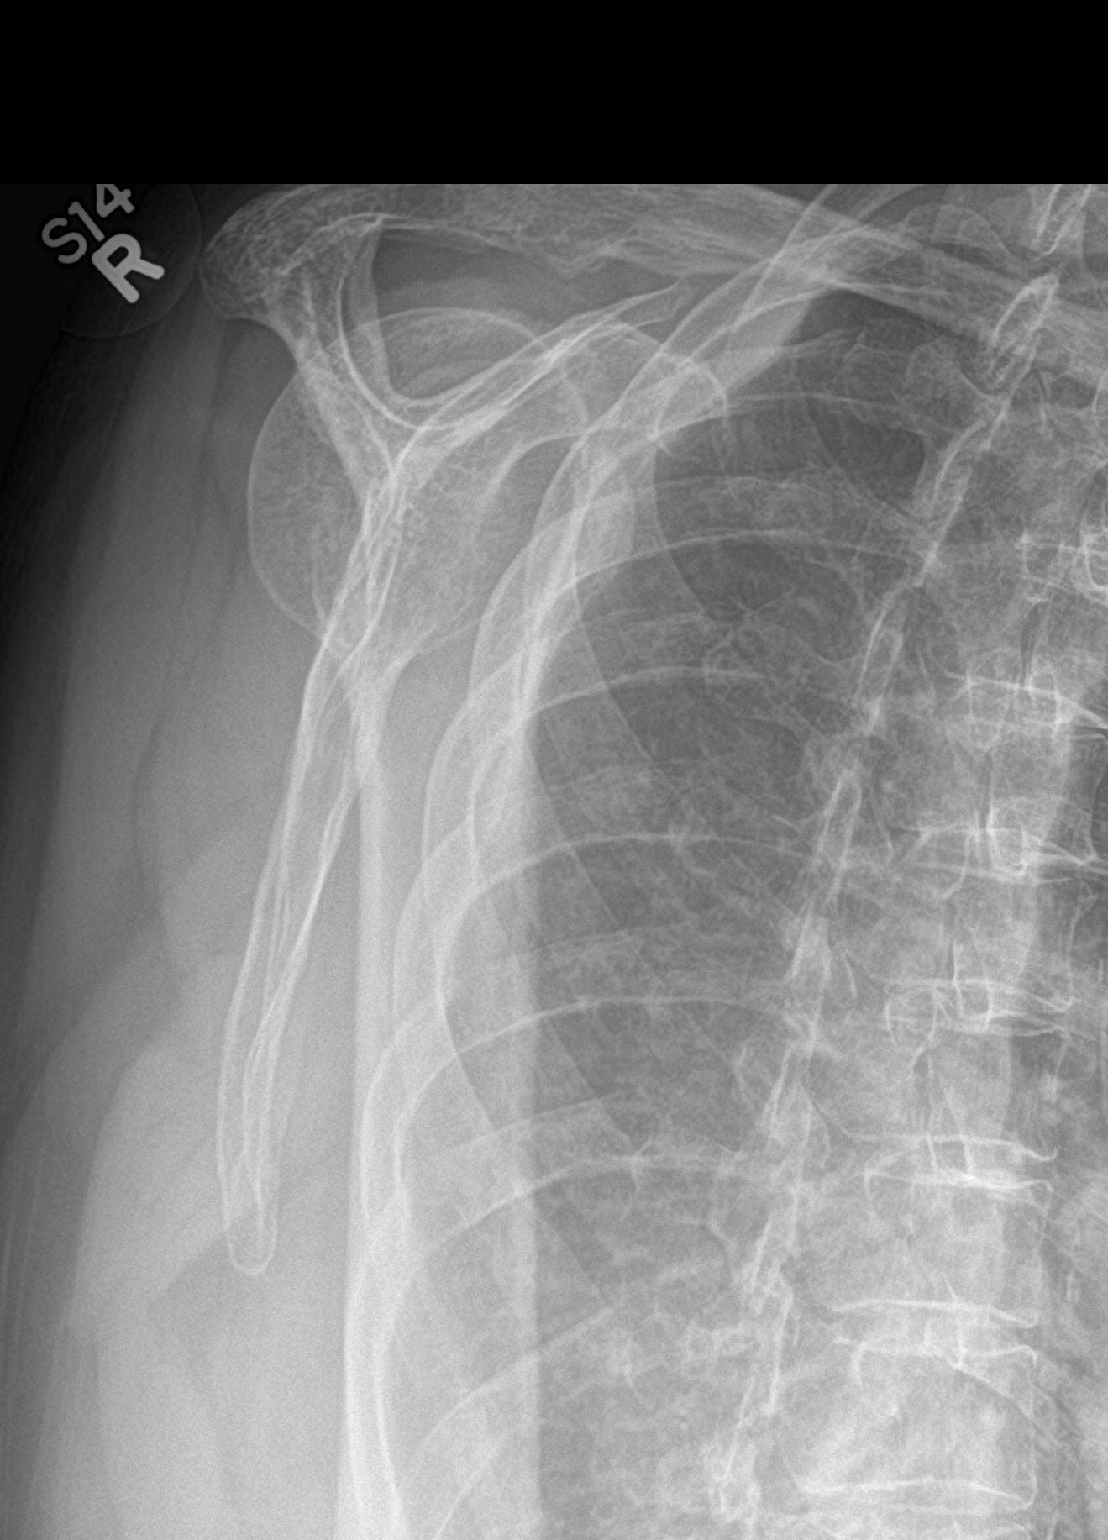

[shoulder axial]
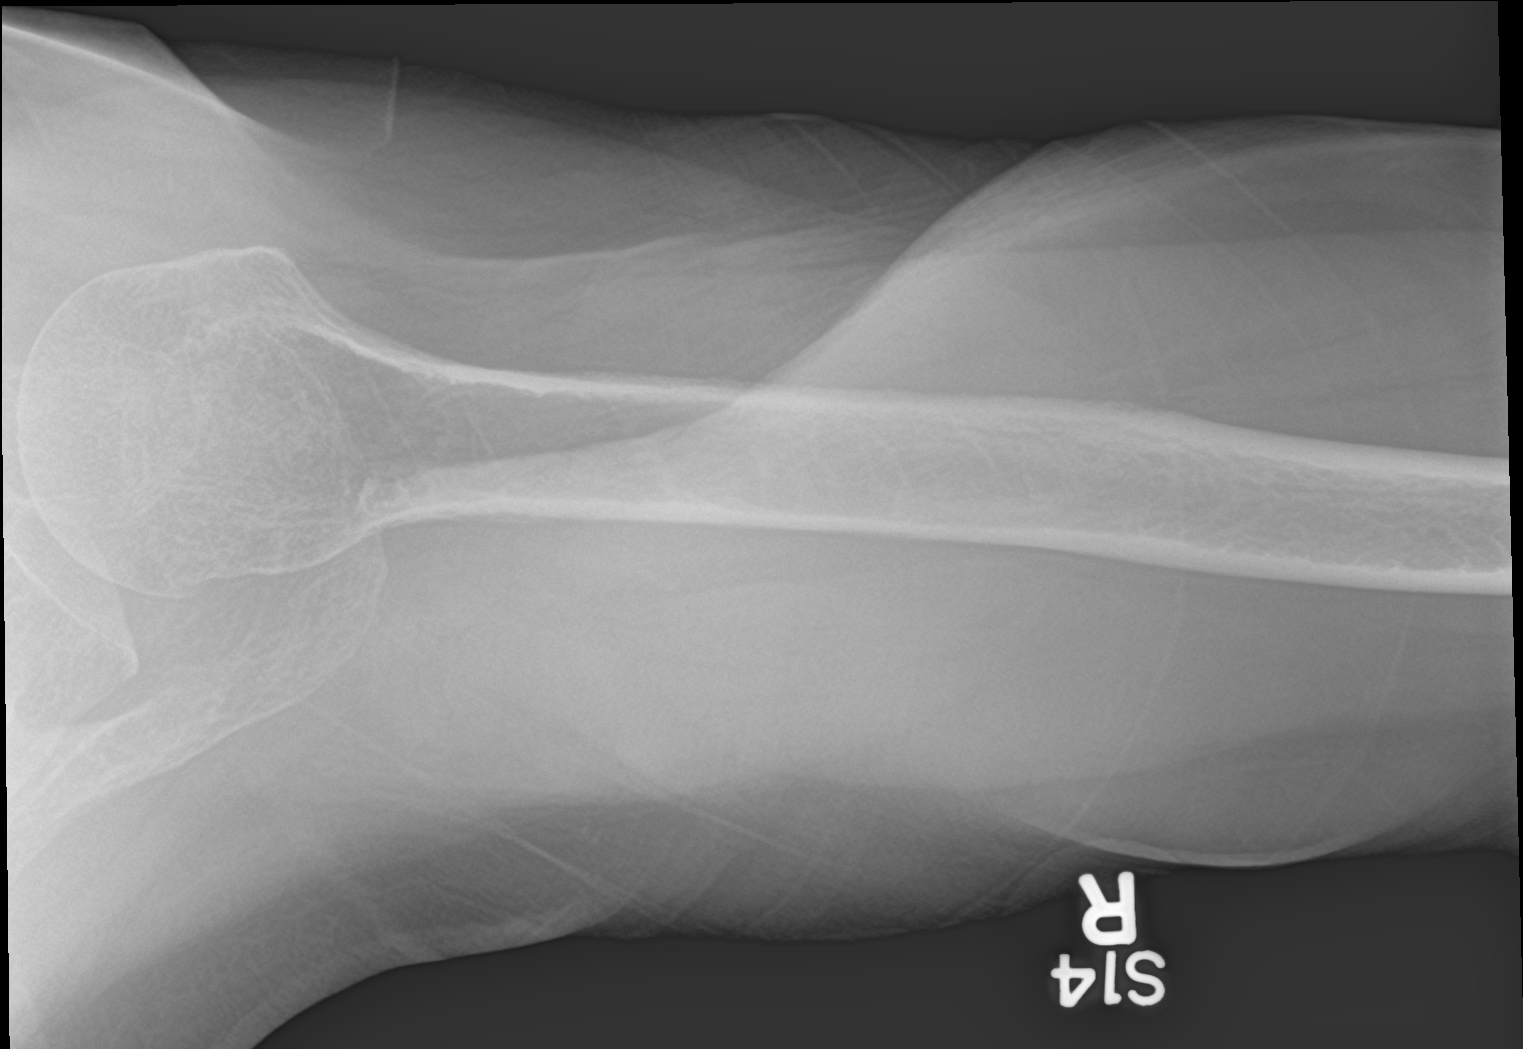

[3 of 3 positions shown; findings below may reference images not displayed]

FINDINGS: Acromioclavicular and glenohumeral degenerative change. Diffuse
osteopenia No evidence of fracture or dislocation. No evidence of
separation.
IMPRESSION: Degenerative change in diffuse osteopenia.  No acute abnormality.

## 2017-07-15 IMAGING — DX DG CERVICAL SPINE 2 OR 3 VIEWS
3 series · 3 of 3 positions shown · non-contrast
Comparison: Chest x-ray 07/13/2012 .

CLINICAL DATA: Pain. No known injury. Pain radiates into right
upper extremity .

EXAM:
CERVICAL SPINE - 2-3 VIEW

[c-spine lat]
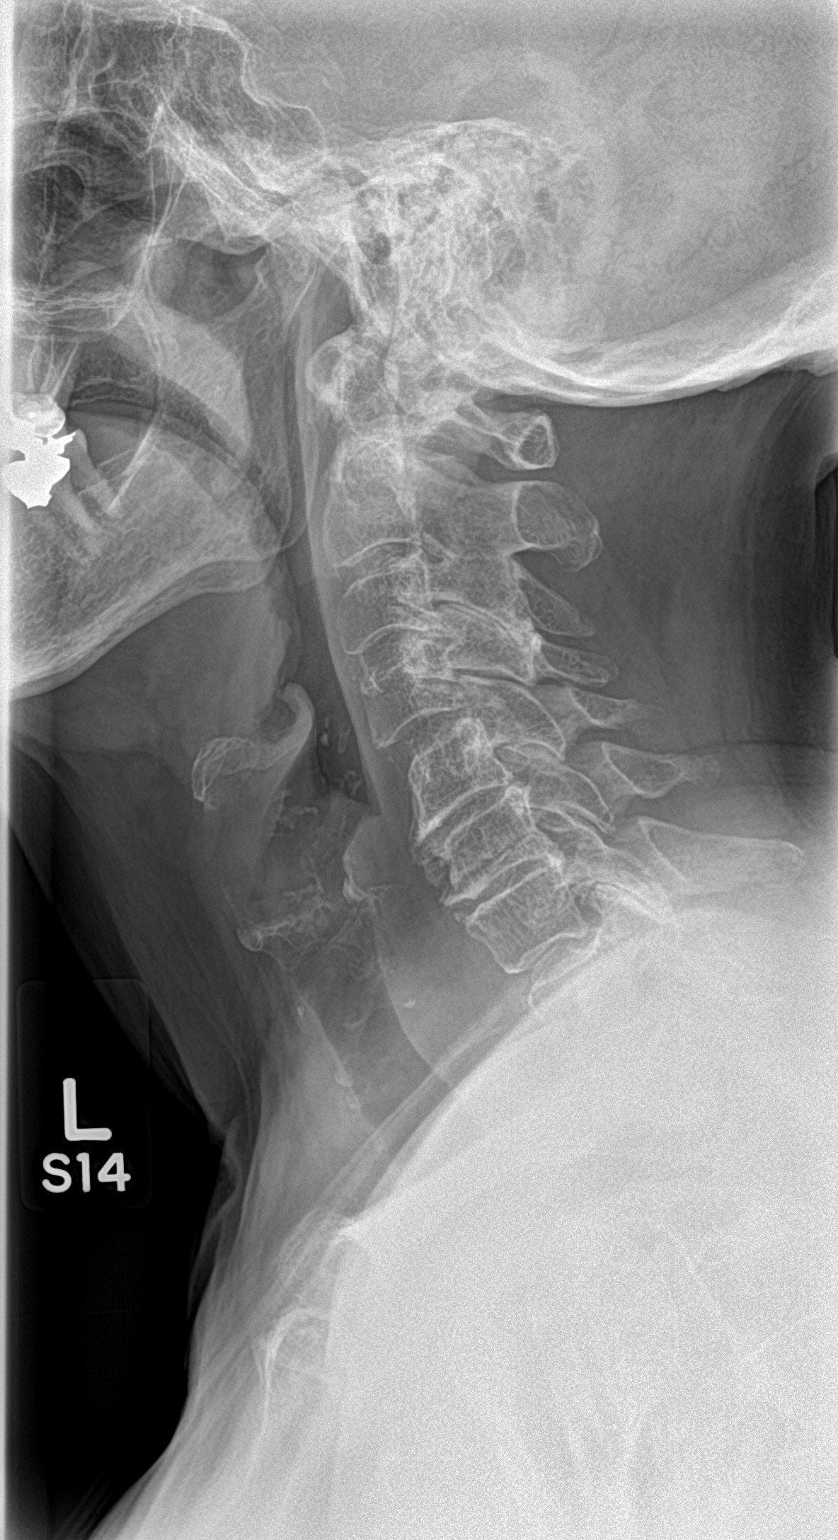

[c-spine ap]
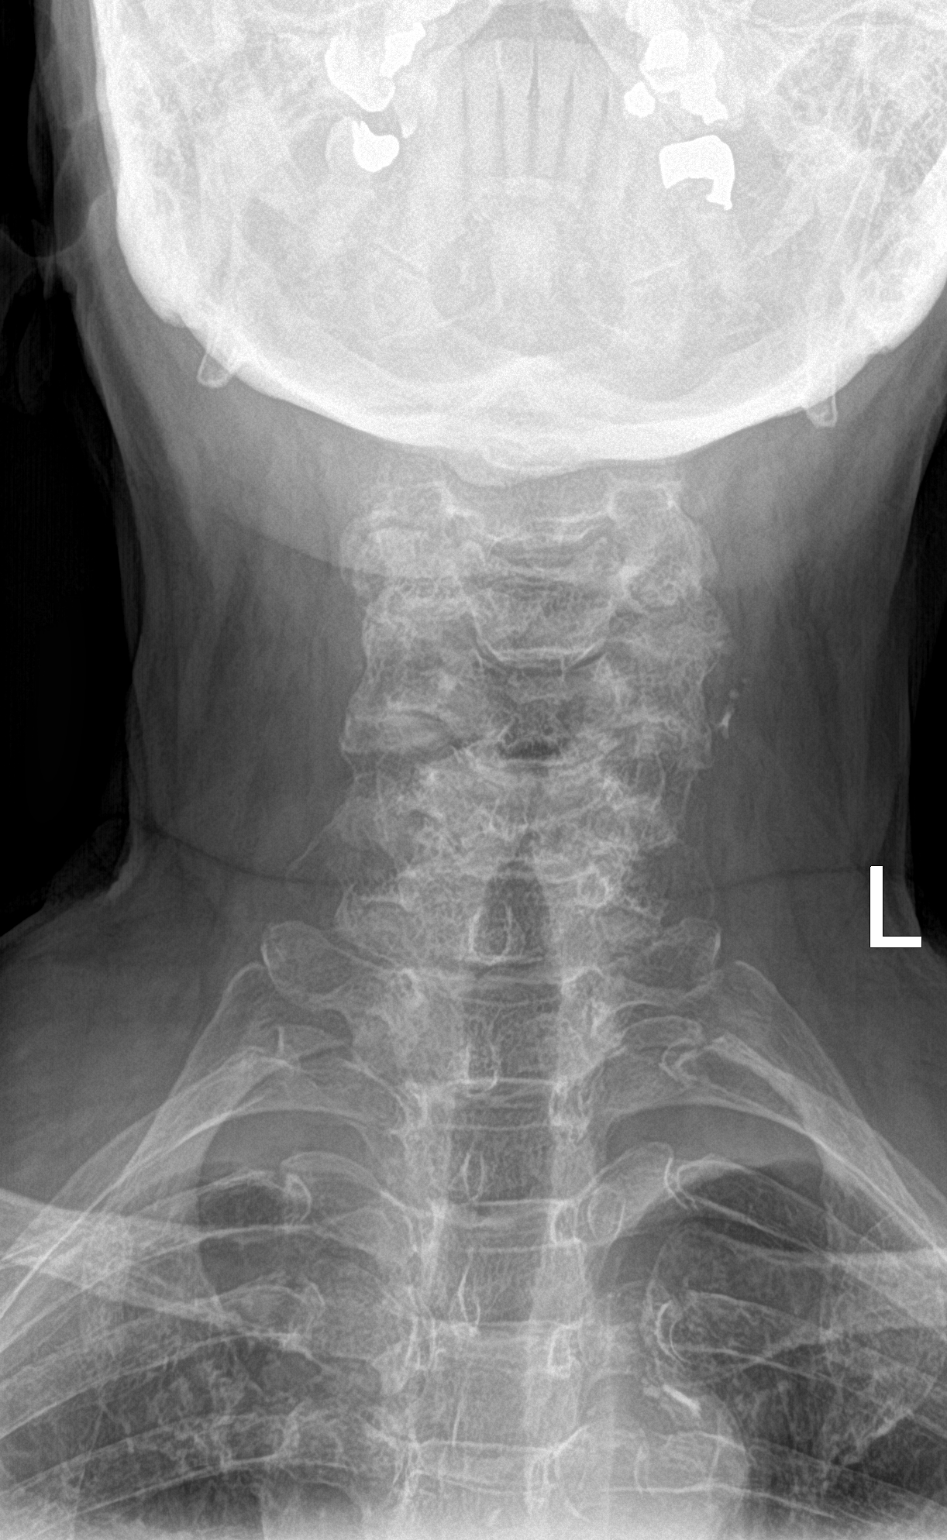

[c-spine open mouth]
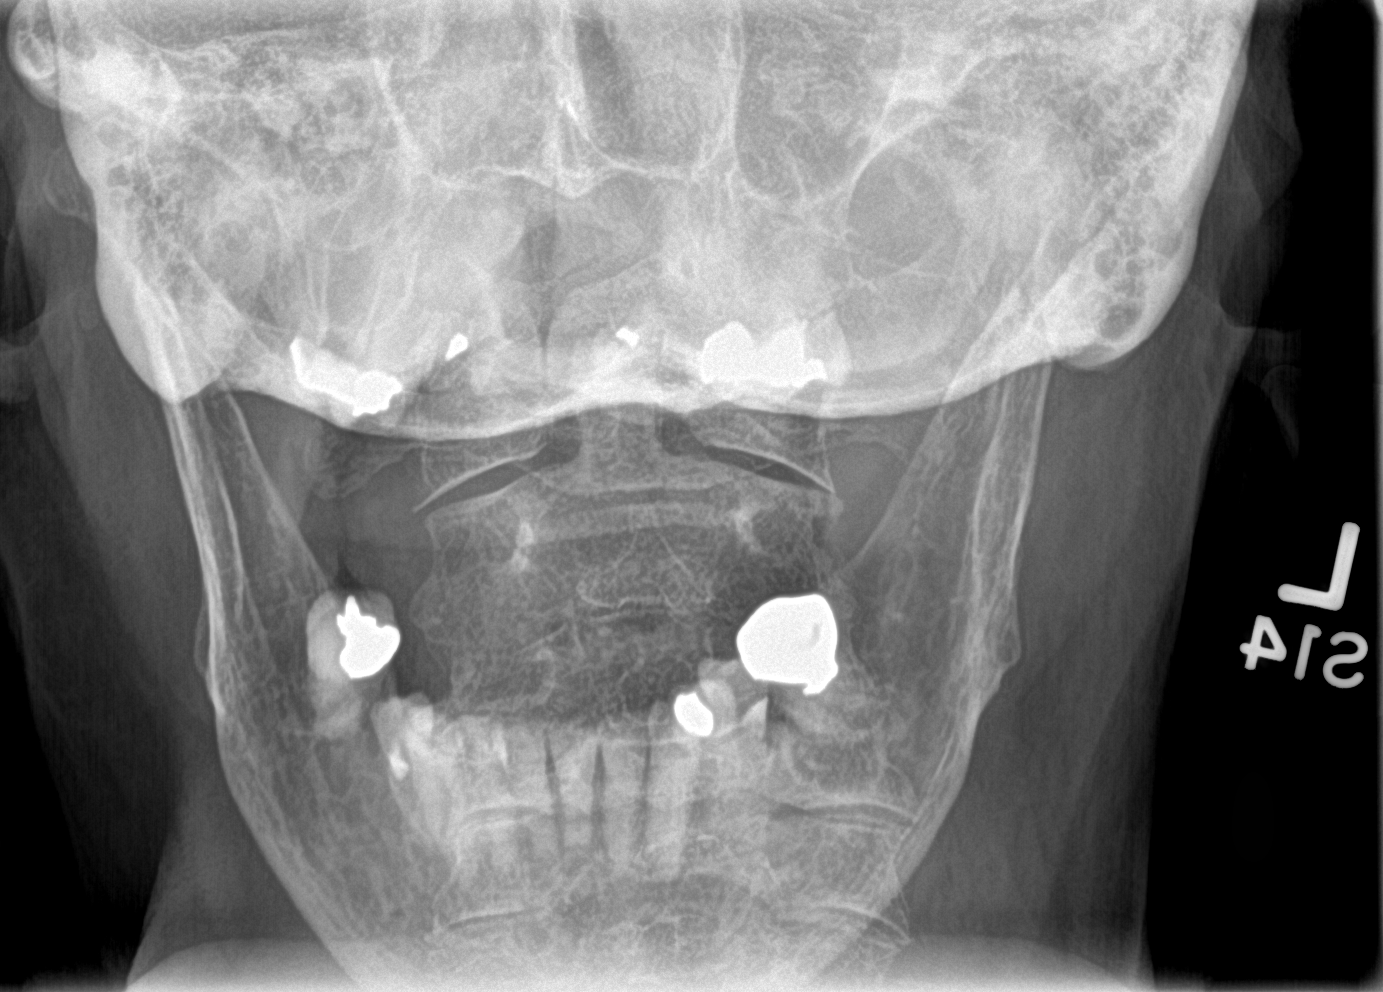

[3 of 3 positions shown; findings below may reference images not displayed]

FINDINGS: Diffuse osteopenia and multilevel degenerative change. Degenerative
disc space loss and endplate osteophytes are most prominent at C5-C6
is C6-C7. Corticated lucency noted the base of C6 is most likely
related to endplate osteophytes. Normal alignment. No evidence of
fracture or dislocation. Left carotid vascular calcification noted
consistent with atherosclerotic vascular disease. Biapical pleural
thickening noted most consistent scarring .
IMPRESSION: 1. Diffuse osteopenia degenerative change. No acute bony
abnormality.
2. Carotid atherosclerotic vascular disease.

## 2017-08-17 NOTE — Progress Notes (Signed)
HPI The patient presents for follow-up of aortic stenosis. His was mild on echo in 2016. She returns for follow-up.  Since I last saw her she has done well.  The patient denies any new symptoms such as chest discomfort, neck or arm discomfort. There has been no new shortness of breath, PND or orthopnea. There have been no reported palpitations, presyncope or syncope.  She cooks for her neighbor who is 58.  She drives and does well.     Allergies  Allergen Reactions  . Sulfonamide Derivatives     REACTION: angioedema    Current Outpatient Medications  Medication Sig Dispense Refill  . ALPRAZolam (XANAX) 0.25 MG tablet TAKE 1 TABLET BY MOUTH TWICE A DAY AS NEEDED **THIS IS A 30 DAY SUPPLY PER MD 30 tablet 5  . clobetasol (TEMOVATE) 0.05 % external solution Use as directed for scale itch    . clotrimazole-betamethasone (LOTRISONE) cream Apply 1 application topically 2 (two) times daily. 45 g 1  . desonide (DESOWEN) 0.05 % cream Apply 1 application topically 2 (two) times daily.    . diclofenac sodium (VOLTAREN) 1 % GEL Apply 2 g topically 3 (three) times daily as needed. 100 g 3  . fluocinonide (LIDEX) 0.05 % external solution APPLY 1ML ON THE SKIN TWICE A DAY  2  . ketoconazole (NIZORAL) 2 % shampoo USE AS DIRECTED 120 mL 0  . ketoconazole (NIZORAL) 2 % shampoo USE AS DIRECTED 120 mL 0  . levothyroxine (SYNTHROID, LEVOTHROID) 50 MCG tablet TAKE 1 TABLET BY MOUTH EVERY DAY 90 tablet 3  . lisinopril (PRINIVIL,ZESTRIL) 20 MG tablet Take 1 tablet (20 mg total) by mouth daily. 90 tablet 3  . Multiple Vitamins-Minerals (MULTIVITAMIN WITH MINERALS) tablet Take 1 tablet by mouth daily.    . mupirocin cream (BACTROBAN) 2 % Apply 1 application topically 2 (two) times daily. 15 g 0  . NEOMYCIN-POLYMYXIN-HYDROCORTISONE (CORTISPORIN) 1 % SOLN otic solution Place 3 drops into the left ear every 6 (six) hours. 10 mL 0  . omeprazole (PRILOSEC) 20 MG capsule TAKE ONE CAPSULE BY MOUTH EVERY MORNING 90  capsule 1  . pravastatin (PRAVACHOL) 20 MG tablet TAKE 1 TABLET BY MOUTH EVERY DAY 90 tablet 1  . valACYclovir (VALTREX) 1000 MG tablet Take 1 tablet (1,000 mg total) by mouth 3 (three) times daily. 21 tablet 0  . vitamin C (ASCORBIC ACID) 500 MG tablet Take 500 mg by mouth daily.    . Vitamin E 400 UNITS TABS Take 1 tablet by mouth daily.     No current facility-administered medications for this visit.     Past Medical History:  Diagnosis Date  . Allergic rhinitis, cause unspecified   . Anxiety state, unspecified   . Aortic valve disorders    Mild AS 2013 echo   . Eczema   . Esophageal reflux   . Herpes zoster without mention of complication   . Hyperlipidemia   . Hypertension   . Hypothyroid   . Osteoarthrosis, unspecified whether generalized or localized, unspecified site   . Unspecified venous (peripheral) insufficiency     Past Surgical History:  Procedure Laterality Date  . CATARACT EXTRACTION, BILATERAL  2013  . VESICOVAGINAL FISTULA CLOSURE W/ TAH      ROS:    Positive for back pain, knee pain, dry mouth.  Otherwise as stated in the HPI and negative for all other systems.  PHYSICAL EXAM BP (!) 142/76 (BP Location: Left Arm, Patient Position: Sitting, Cuff Size: Normal)  Pulse 66   Ht 5\' 4"  (1.626 m)   Wt 160 lb 9.6 oz (72.8 kg)   BMI 27.57 kg/m   GENERAL:  Well appearing NECK:  No jugular venous distention, waveform within normal limits, carotid upstroke brisk and symmetric, no bruits, no thyromegaly LUNGS:  Clear to auscultation bilaterally CHEST:  Unremarkable HEART:  PMI not displaced or sustained,S1 and S2 within normal limits, no S3, no S4, no clicks, no rubs, 3 of 6 apical systolic murmur early to mid peaking, no diastolic murmurs murmurs ABD:  Flat, positive bowel sounds normal in frequency in pitch, no bruits, no rebound, no guarding, no midline pulsatile mass, no hepatomegaly, no splenomegaly EXT:  2 plus pulses throughout, no edema, no cyanosis no  clubbing   EKG: Sinus rhythm, rate 66, premature atrial contractions, left bundle branch block, no change from previous.   08/18/2017   ASSESSMENT AND PLAN  LBBB: This is chronic.  No change in therapy.    HTN: Blood pressure is well controlled.  She will continue the meds as listed.  Aortic stenosis:  This might have progressed slightly.  I will check an echocardiogram. ated.

## 2017-08-18 ENCOUNTER — Ambulatory Visit: Payer: PPO | Admitting: Cardiology

## 2017-08-18 ENCOUNTER — Encounter: Payer: Self-pay | Admitting: Cardiology

## 2017-08-18 VITALS — BP 142/76 | HR 66 | Ht 64.0 in | Wt 160.6 lb

## 2017-08-18 DIAGNOSIS — I359 Nonrheumatic aortic valve disorder, unspecified: Secondary | ICD-10-CM

## 2017-08-18 DIAGNOSIS — I10 Essential (primary) hypertension: Secondary | ICD-10-CM

## 2017-08-18 NOTE — Patient Instructions (Signed)

## 2017-08-26 ENCOUNTER — Other Ambulatory Visit: Payer: Self-pay

## 2017-08-26 ENCOUNTER — Ambulatory Visit (HOSPITAL_COMMUNITY): Payer: PPO | Attending: Cardiovascular Disease

## 2017-08-26 DIAGNOSIS — I5030 Unspecified diastolic (congestive) heart failure: Secondary | ICD-10-CM | POA: Insufficient documentation

## 2017-08-26 DIAGNOSIS — I082 Rheumatic disorders of both aortic and tricuspid valves: Secondary | ICD-10-CM | POA: Diagnosis not present

## 2017-08-26 DIAGNOSIS — I359 Nonrheumatic aortic valve disorder, unspecified: Secondary | ICD-10-CM | POA: Diagnosis not present

## 2017-09-08 ENCOUNTER — Ambulatory Visit (INDEPENDENT_AMBULATORY_CARE_PROVIDER_SITE_OTHER): Payer: PPO | Admitting: Internal Medicine

## 2017-09-08 ENCOUNTER — Encounter: Payer: Self-pay | Admitting: Internal Medicine

## 2017-09-08 DIAGNOSIS — R21 Rash and other nonspecific skin eruption: Secondary | ICD-10-CM

## 2017-09-08 MED ORDER — FLUCONAZOLE 150 MG PO TABS: 150.0000 mg | ORAL_TABLET | Freq: Once | ORAL | 0 refills | Status: AC

## 2017-09-08 MED ORDER — NYSTATIN-TRIAMCINOLONE 100000-0.1 UNIT/GM-% EX OINT: 1.0000 "application " | TOPICAL_OINTMENT | Freq: Two times a day (BID) | CUTANEOUS | 0 refills | Status: DC

## 2017-09-08 NOTE — Patient Instructions (Signed)
We have sent in a pill to take today for the rash called diflucan. We have also sent in a cream to use twice a day on the rashes.

## 2017-09-09 ENCOUNTER — Encounter: Payer: Self-pay | Admitting: Internal Medicine

## 2017-09-09 NOTE — Assessment & Plan Note (Signed)
Fungal rash on the inguinal region and the below the breasts. Rx for diflucan and mycolog cream.

## 2017-09-09 NOTE — Progress Notes (Signed)
   Subjective:    Patient ID: Deanna Olson, female    DOB: [birth year], 84 y.o.   MRN: [id number]  HPI The patient is an 84 YO female coming in for rash on her body. Denies fevers or chills. Started 1-2 weeks ago and overall worsening. Is itching and burning. Does hurt when it rubs against clothes. She has tried many otc creams on it without relief.   Review of Systems  Constitutional: Negative.   Respiratory: Negative for cough, chest tightness and shortness of breath.   Cardiovascular: Negative for chest pain, palpitations and leg swelling.  Gastrointestinal: Negative for abdominal distention, abdominal pain, constipation, diarrhea, nausea and vomiting.  Musculoskeletal: Negative.   Skin: Positive for color change and rash.  Neurological: Negative.   Psychiatric/Behavioral: Negative.       Objective:   Physical Exam  Constitutional: She is oriented to person, place, and time. She appears well-developed and well-nourished.  HENT:  Head: Normocephalic and atraumatic.  Eyes: EOM are normal.  Neck: Normal range of motion.  Cardiovascular: Normal rate and regular rhythm.  Pulmonary/Chest: Effort normal and breath sounds normal. No respiratory distress. She has no wheezes. She has no rales.  Abdominal: Soft.  Neurological: She is alert and oriented to person, place, and time. Coordination normal.  Skin: Skin is warm and dry. Rash noted.  Red rash with satellite lesions bilaterally under the breasts and on the inguinal folds left greater than right.    Vitals:   09/08/17 1604  BP: 140/80  Pulse: 62  Temp: 98.1 F (36.7 C)  TempSrc: Oral  SpO2: 96%  Weight: 161 lb (73 kg)  Height: 5\' 4"  (1.626 m)      Assessment & Plan:

## 2017-10-03 ENCOUNTER — Ambulatory Visit (INDEPENDENT_AMBULATORY_CARE_PROVIDER_SITE_OTHER): Payer: PPO | Admitting: Nurse Practitioner

## 2017-10-03 ENCOUNTER — Encounter: Payer: Self-pay | Admitting: Nurse Practitioner

## 2017-10-03 VITALS — BP 134/60 | HR 68 | Temp 98.2°F | Resp 16 | Ht 64.0 in | Wt 160.8 lb

## 2017-10-03 DIAGNOSIS — J309 Allergic rhinitis, unspecified: Secondary | ICD-10-CM | POA: Diagnosis not present

## 2017-10-03 MED ORDER — FLUTICASONE PROPIONATE 50 MCG/ACT NA SUSP
2.0000 | Freq: Every day | NASAL | 1 refills | Status: DC
Start: 2017-10-03 — End: 2017-10-25

## 2017-10-03 NOTE — Progress Notes (Signed)
Name: Deanna Olson   MRN: [id number]    DOB: Mar 25, 1926   Date:10/03/2017       Progress Note  Subjective  Chief Complaint  Chief Complaint  Patient presents with  . Cough    cough and congestion since last friday, states she has taken OTC medication and saline nasal spray has not helped    HPI  Cough- This is a new problem The cough began on this past Friday after visiting a friend in SNF on Thursday She woke Friday with nasal congestion and stuffiness She c/o ear pressure, sneezing, hoarse voice, postnasal drip, cough with clear to white sputum. She denies fevers, anorexia, headaches, sore throat, chest pain, shortness of breath, wheezing, abdominal pain, nausea, vomiting.  She has tried: Copywriter, advertising plus cold and flu Cough syrup Saline spray With no relief She feels somewhat better today than yesterday. She is afraid she caught a cold at the SNF.   Patient Active Problem List   Diagnosis Date Noted  . Rash 01/27/2017  . Low back pain 07/06/2016  . Right knee pain 07/06/2016  . CKD (chronic kidney disease) stage 3, GFR 30-59 ml/min (HCC) 08/13/2014  . Routine general medical examination at a health care facility 08/13/2014  . Vitamin D deficiency 12/02/2009  . AORTIC VALVE DISEASE 01/24/2008  . DEGENERATIVE JOINT DISEASE 11/05/2007  . Hypothyroidism 10/31/2007  . Essential hypertension 10/31/2007  . Allergic rhinitis 10/31/2007  . Hyperlipidemia 04/12/2007  . Anxiety state 04/12/2007  . GERD 04/12/2007    Social History   Tobacco Use  . Smoking status: Never Smoker  . Smokeless tobacco: Never Used  Substance Use Topics  . Alcohol use: Not on file     Current Outpatient Medications:  .  ALPRAZolam (XANAX) 0.25 MG tablet, TAKE 1 TABLET BY MOUTH TWICE A DAY AS NEEDED **THIS IS A 30 DAY SUPPLY PER MD, Disp: 30 tablet, Rfl: 5 .  clobetasol (TEMOVATE) 0.05 % external solution, Use as directed for scale itch, Disp: , Rfl:  .  clotrimazole-betamethasone  (LOTRISONE) cream, Apply 1 application topically 2 (two) times daily., Disp: 45 g, Rfl: 1 .  desonide (DESOWEN) 0.05 % cream, Apply 1 application topically 2 (two) times daily., Disp: , Rfl:  .  diclofenac sodium (VOLTAREN) 1 % GEL, Apply 2 g topically 3 (three) times daily as needed., Disp: 100 g, Rfl: 3 .  fluocinonide (LIDEX) 0.05 % external solution, APPLY 1ML ON THE SKIN TWICE A DAY, Disp: , Rfl: 2 .  ketoconazole (NIZORAL) 2 % shampoo, USE AS DIRECTED, Disp: 120 mL, Rfl: 0 .  ketoconazole (NIZORAL) 2 % shampoo, USE AS DIRECTED, Disp: 120 mL, Rfl: 0 .  levothyroxine (SYNTHROID, LEVOTHROID) 50 MCG tablet, TAKE 1 TABLET BY MOUTH EVERY DAY, Disp: 90 tablet, Rfl: 3 .  lisinopril (PRINIVIL,ZESTRIL) 20 MG tablet, Take 1 tablet (20 mg total) by mouth daily., Disp: 90 tablet, Rfl: 3 .  Multiple Vitamins-Minerals (MULTIVITAMIN WITH MINERALS) tablet, Take 1 tablet by mouth daily., Disp: , Rfl:  .  mupirocin cream (BACTROBAN) 2 %, Apply 1 application topically 2 (two) times daily., Disp: 15 g, Rfl: 0 .  NEOMYCIN-POLYMYXIN-HYDROCORTISONE (CORTISPORIN) 1 % SOLN otic solution, Place 3 drops into the left ear every 6 (six) hours., Disp: 10 mL, Rfl: 0 .  nystatin-triamcinolone ointment (MYCOLOG), Apply 1 application topically 2 (two) times daily., Disp: 60 g, Rfl: 0 .  omeprazole (PRILOSEC) 20 MG capsule, TAKE ONE CAPSULE BY MOUTH EVERY MORNING, Disp: 90 capsule, Rfl: 1 .  pravastatin (  PRAVACHOL) 20 MG tablet, TAKE 1 TABLET BY MOUTH EVERY DAY, Disp: 90 tablet, Rfl: 1 .  valACYclovir (VALTREX) 1000 MG tablet, Take 1 tablet (1,000 mg total) by mouth 3 (three) times daily., Disp: 21 tablet, Rfl: 0 .  vitamin C (ASCORBIC ACID) 500 MG tablet, Take 500 mg by mouth daily., Disp: , Rfl:  .  Vitamin E 400 UNITS TABS, Take 1 tablet by mouth daily., Disp: , Rfl:   Allergies  Allergen Reactions  . Sulfonamide Derivatives     REACTION: angioedema    ROS  No other specific complaints in a complete review of systems  (except as listed in HPI above).  Objective  Vitals:   10/03/17 1520  BP: 134/60  Pulse: 68  Resp: 16  Temp: 98.2 F (36.8 C)  TempSrc: Oral  SpO2: 95%  Weight: 160 lb 12.8 oz (72.9 kg)  Height: 5\' 4"  (1.626 m)    Body mass index is 27.6 kg/m.  Nursing Note and Vital Signs reviewed.  Physical Exam  Constitutional: Patient appears well-developed and well-nourished. No distress.  HEENT: head atraumatic, normocephalic, pupils equal and reactive to light, EOM's intact, TM's without erythema or bulging,  no maxillary or frontal sinus tenderness , neck supple without lymphadenopathy, oropharynx pink and moist without exudate Cardiovascular: Normal rate, regular rhythm, S1/S2 present. Murmur heard. No BLE edema. Pulmonary/Chest: Effort normal and breath sounds clear. No respiratory distress or retractions. Neurological: She is alert and oriented to person, place, and time.  Coordination, balance, strength, speech and gait are normal.  Skin: Skin is warm and dry. No rash noted. No erythema.  Psychiatric: Patient has a normal mood and affect. behavior is normal. Judgment and thought content normal.  Assessment & Plan   1. Allergic rhinitis, unspecified seasonality, unspecified trigger Patient requested z-pak but we discussed that etiology of her symptoms is likely Viral vs allergic rhinitis at this point and need to use abx appropriately to prevent adverse outcomes including antibiotic resistance Prescription given for flonase- dosing and side effects discussed Also Discussed home management of symptoms, Red flags and when to present for emergency care or RTC including fever >101.38F, chest pain, shortness of breath, new/worsening/un-resolving symptoms,reviewed with patient at time of visit. Follow up and care instructions discussed and provided in AVS. - fluticasone (FLONASE) 50 MCG/ACT nasal spray; Place 2 sprays into both nostrils daily.  Dispense: 16 g; Refill: 1

## 2017-10-03 NOTE — Patient Instructions (Addendum)
I have sent a prescription for flonase nasal spray to your pharmacy- you may start with 2 sprays in each nostril daily then reduce to 1 spray in each nostril daily when your symptoms improve You may also take an over the counter allergy medication such as claritin or zyrtec for your symptoms.  Please follow up if you start to feel worse, develop fevers over 101, or are not getting better by this Thursday as we discussed.  I hope you feel better!   Allergic Rhinitis, Adult Allergic rhinitis is an allergic reaction that affects the mucous membrane inside the nose. It causes sneezing, a runny or stuffy nose, and the feeling of mucus going down the back of the throat (postnasal drip). Allergic rhinitis can be mild to severe. There are two types of allergic rhinitis:  Seasonal. This type is also called hay fever. It happens only during certain seasons.  Perennial. This type can happen at any time of the year.  What are the causes? This condition happens when the body's defense system (immune system) responds to certain harmless substances called allergens as though they were germs.  Seasonal allergic rhinitis is triggered by pollen, which can come from grasses, trees, and weeds. Perennial allergic rhinitis may be caused by:  House dust mites.  Pet dander.  Mold spores.  What are the signs or symptoms? Symptoms of this condition include:  Sneezing.  Runny or stuffy nose (nasal congestion).  Postnasal drip.  Itchy nose.  Tearing of the eyes.  Trouble sleeping.  Daytime sleepiness.  How is this diagnosed? This condition may be diagnosed based on:  Your medical history.  A physical exam.  Tests to check for related conditions, such as: ? Asthma. ? Pink eye. ? Ear infection. ? Upper respiratory infection.  Tests to find out which allergens trigger your symptoms. These may include skin or blood tests.  How is this treated? There is no cure for this condition, but  treatment can help control symptoms. Treatment may include:  Taking medicines that block allergy symptoms, such as antihistamines. Medicine may be given as a shot, nasal spray, or pill.  Avoiding the allergen.  Desensitization. This treatment involves getting ongoing shots until your body becomes less sensitive to the allergen. This treatment may be done if other treatments do not help.  If taking medicine and avoiding the allergen does not work, new, stronger medicines may be prescribed.  Follow these instructions at home:  Find out what you are allergic to. Common allergens include smoke, dust, and pollen.  Avoid the things you are allergic to. These are some things you can do to help avoid allergens: ? Replace carpet with wood, tile, or vinyl flooring. Carpet can trap dander and dust. ? Do not smoke. Do not allow smoking in your home. ? Change your heating and air conditioning filter at least once a month. ? During allergy season:  Keep windows closed as much as possible.  Plan outdoor activities when pollen counts are lowest. This is usually during the evening hours.  When coming indoors, change clothing and shower before sitting on furniture or bedding.  Take over-the-counter and prescription medicines only as told by your health care provider.  Keep all follow-up visits as told by your health care provider. This is important. Contact a health care provider if:  You have a fever.  You develop a persistent cough.  You make whistling sounds when you breathe (you wheeze).  Your symptoms interfere with your normal daily activities.  Get help right away if:  You have shortness of breath. Summary  This condition can be managed by taking medicines as directed and avoiding allergens.  Contact your health care provider if you develop a persistent cough or fever.  During allergy season, keep windows closed as much as possible. This information is not intended to replace  advice given to you by your health care provider. Make sure you discuss any questions you have with your health care provider. Document Released: 02/23/2001 Document Revised: 07/08/2016 Document Reviewed: 07/08/2016 Elsevier Interactive Patient Education  [employer].

## 2017-10-06 ENCOUNTER — Telehealth: Payer: Self-pay

## 2017-10-06 MED ORDER — AZITHROMYCIN 250 MG PO TABS: ORAL_TABLET | ORAL | 0 refills | Status: DC

## 2017-10-06 NOTE — Telephone Encounter (Signed)
Please advise on send medication. Thanks   Copied from [place]. Topic: General - Other >> Oct 06, 2017  8:40 AM Yvette Rack wrote: Reason for CRM: patient calling stating that her cough has gotten worst her mucus is gray looking she would like a shot or the Z-Pac that she asked for   >> Oct 06, 2017  9:08 AM Morphies, Isidoro Donning wrote: Deanna Olson on 10/03/17

## 2017-10-06 NOTE — Telephone Encounter (Signed)
rx for z-pak sent to pt pharmacy Please have her follow up if no improvement after z-pak

## 2017-10-06 NOTE — Telephone Encounter (Signed)
Pt aware.

## 2017-10-09 ENCOUNTER — Other Ambulatory Visit: Payer: Self-pay | Admitting: Internal Medicine

## 2017-10-25 ENCOUNTER — Other Ambulatory Visit: Payer: Self-pay | Admitting: Nurse Practitioner

## 2017-10-25 DIAGNOSIS — J309 Allergic rhinitis, unspecified: Secondary | ICD-10-CM

## 2017-11-03 ENCOUNTER — Other Ambulatory Visit: Payer: Self-pay | Admitting: Internal Medicine

## 2017-11-14 ENCOUNTER — Ambulatory Visit (INDEPENDENT_AMBULATORY_CARE_PROVIDER_SITE_OTHER): Payer: PPO | Admitting: *Deleted

## 2017-11-14 VITALS — BP 126/62 | HR 60 | Resp 18 | Ht 64.0 in | Wt 161.0 lb

## 2017-11-14 DIAGNOSIS — Z0000 Encounter for general adult medical examination without abnormal findings: Secondary | ICD-10-CM

## 2017-11-14 DIAGNOSIS — E2839 Other primary ovarian failure: Secondary | ICD-10-CM | POA: Diagnosis not present

## 2017-11-14 NOTE — Progress Notes (Addendum)
Subjective:   Deanna Olson is a 84 y.o. female who presents for Medicare Annual (Subsequent) preventive examination.  Review of Systems:  No ROS.  Medicare Wellness Visit. Additional risk factors are reflected in the social history.  Cardiac Risk Factors include: advanced age (>60men, >44 women);dyslipidemia;hypertension Sleep patterns: gets up 1-2 times nightly to void and sleeps 7-8 hours nightly.    Home Safety/Smoke Alarms: Feels safe in home. Smoke alarms in place.  Living environment; residence and Firearm Safety: 1-story house/ trailer, no firearms. Lives alone, no needs for DME, good support system Seat Belt Safety/Bike Helmet: Wears seat belt.     Objective:     Vitals: BP 126/62   Pulse 60   Resp 18   Ht 5\' 4"  (1.626 m)   Wt 161 lb (73 kg)   SpO2 100%   BMI 27.64 kg/m   Body mass index is 27.64 kg/m.  Advanced Directives 11/14/2017 08/26/2015  Does Patient Have a Medical Advance Directive? Yes Yes  Type of Paramedic of [place];Living will -  Copy of [place] in Chart? No - copy requested Yes    Tobacco Social History   Tobacco Use  Smoking Status Never Smoker  Smokeless Tobacco Never Used     Counseling given: Not Answered   Past Medical History:  Diagnosis Date  . Allergic rhinitis, cause unspecified   . Anxiety state, unspecified   . Aortic valve disorders    Mild AS 2013 echo   . Eczema   . Esophageal reflux   . Herpes zoster without mention of complication   . Hyperlipidemia   . Hypertension   . Hypothyroid   . Osteoarthrosis, unspecified whether generalized or localized, unspecified site   . Unspecified venous (peripheral) insufficiency    Past Surgical History:  Procedure Laterality Date  . CATARACT EXTRACTION, BILATERAL  2013  . VESICOVAGINAL FISTULA CLOSURE W/ TAH     Family History  Problem Relation Age of Onset  . Stroke Mother 76  . Diabetes Mother   . Diabetes Brother   . Heart  attack Brother 77       s/p open heart surg  . CAD Brother 58  . Cancer Sister        4 types   Social History   Socioeconomic History  . Marital status: Widowed    Spouse name: (wilbur Mcmeekin)  . Number of children: Not on file  . Years of education: Not on file  . Highest education level: Not on file  Occupational History  . Occupation: retired  Scientific laboratory technician  . Financial resource strain: Not hard at all  . Food insecurity:    Worry: Never true    Inability: Never true  . Transportation needs:    Medical: No    Non-medical: No  Tobacco Use  . Smoking status: Never Smoker  . Smokeless tobacco: Never Used  Substance and Sexual Activity  . Alcohol use: Never    Alcohol/week: 0.0 oz    Frequency: Never  . Drug use: Never  . Sexual activity: Never  Lifestyle  . Physical activity:    Days per week: 3 days    Minutes per session: 30 min  . Stress: Not at all  Relationships  . Social connections:    Talks on phone: More than three times a week    Gets together: More than three times a week    Attends religious service: More than 4 times per year  Active member of club or organization: Yes    Attends meetings of clubs or organizations: More than 4 times per year    Relationship status: Widowed  Other Topics Concern  . Not on file  Social History Narrative  . Not on file    Outpatient Encounter Medications as of 11/14/2017  Medication Sig  . ALPRAZolam (XANAX) 0.25 MG tablet TAKE 1 TABLET BY MOUTH TWICE A DAY AS NEEDED **THIS IS A 30 DAY SUPPLY PER MD  . clobetasol (TEMOVATE) 0.05 % external solution Use as directed for scale itch  . desonide (DESOWEN) 0.05 % cream Apply 1 application topically 2 (two) times daily.  . diclofenac sodium (VOLTAREN) 1 % GEL Apply 2 g topically 3 (three) times daily as needed.  . fluocinonide (LIDEX) 0.05 % external solution APPLY 1ML ON THE SKIN TWICE A DAY  . ketoconazole (NIZORAL) 2 % shampoo USE AS DIRECTED  . levothyroxine  (SYNTHROID, LEVOTHROID) 50 MCG tablet Take 1 tablet (50 mcg total) by mouth daily. Need annual appointment for further refills  . lisinopril (PRINIVIL,ZESTRIL) 20 MG tablet Take 1 tablet (20 mg total) by mouth daily.  . Multiple Vitamins-Minerals (MULTIVITAMIN WITH MINERALS) tablet Take 1 tablet by mouth daily.  Marland Kitchen nystatin-triamcinolone ointment (MYCOLOG) Apply 1 application topically 2 (two) times daily.  Marland Kitchen omeprazole (PRILOSEC) 20 MG capsule TAKE ONE CAPSULE BY MOUTH EVERY MORNING  . pravastatin (PRAVACHOL) 20 MG tablet TAKE 1 TABLET BY MOUTH EVERY DAY  . vitamin C (ASCORBIC ACID) 500 MG tablet Take 500 mg by mouth daily.  . Vitamin E 400 UNITS TABS Take 1 tablet by mouth daily.  . NEOMYCIN-POLYMYXIN-HYDROCORTISONE (CORTISPORIN) 1 % SOLN otic solution Place 3 drops into the left ear every 6 (six) hours.  . [DISCONTINUED] azithromycin (ZITHROMAX) 250 MG tablet 2 tablets on day 1, then 1 tablet every day until complete (Patient not taking: Reported on 11/14/2017)  . [DISCONTINUED] clotrimazole-betamethasone (LOTRISONE) cream Apply 1 application topically 2 (two) times daily. (Patient not taking: Reported on 11/14/2017)  . [DISCONTINUED] fluticasone (FLONASE) 50 MCG/ACT nasal spray Place 2 sprays into both nostrils daily. Need annual appointment for further refills (Patient not taking: Reported on 11/14/2017)  . [DISCONTINUED] ketoconazole (NIZORAL) 2 % shampoo USE AS DIRECTED (Patient not taking: Reported on 11/14/2017)  . [DISCONTINUED] mupirocin cream (BACTROBAN) 2 % Apply 1 application topically 2 (two) times daily. (Patient not taking: Reported on 11/14/2017)  . [DISCONTINUED] valACYclovir (VALTREX) 1000 MG tablet Take 1 tablet (1,000 mg total) by mouth 3 (three) times daily. (Patient not taking: Reported on 11/14/2017)   No facility-administered encounter medications on file as of 11/14/2017.     Activities of Daily Living In your present state of health, do you have any difficulty performing the  following activities: 11/14/2017  Hearing? N  Vision? N  Difficulty concentrating or making decisions? N  Walking or climbing stairs? N  Dressing or bathing? N  Doing errands, shopping? N  Preparing Food and eating ? N  Using the Toilet? N  In the past six months, have you accidently leaked urine? Y  Do you have problems with loss of bowel control? N  Managing your Medications? N  Managing your Finances? N  Housekeeping or managing your Housekeeping? N  Some recent data might be hidden    Patient Care Team: Hoyt Koch, MD as PCP - General (Internal Medicine) Druscilla Brownie, MD (Dermatology) Minus Breeding, MD (Cardiology) Shon Hough, MD (Ophthalmology)    Assessment:   This is a routine  wellness examination for Arriyanna. Physical assessment deferred to PCP.   Exercise Activities and Dietary recommendations Current Exercise Habits: Home exercise routine(chair exercises provided), Type of exercise: walking, Time (Minutes): 30, Frequency (Times/Week): 3, Weekly Exercise (Minutes/Week): 90, Intensity: Mild, Exercise limited by: orthopedic condition(s)  Diet (meal preparation, eat out, water intake, caffeinated beverages, dairy products, fruits and vegetables): in general, a "healthy" diet  , well balanced   Reviewed heart healthy diet, encouraged patient to increase daily water intake.   Goals    . Exercise 150 minutes per week (moderate activity)     Walks now 30' 5 days if weather permits Will consider Silver Sneakers     . Patient Stated     I want to increase my physical activity. I would like to try [employer] fitness. Enjoy life and family.       Fall Risk Fall Risk  03/10/2017 08/26/2015 04/30/2015 01/10/2013  Falls in the past year? No Yes Yes No  Number falls in past yr: - 2 or more 2 or more -  Injury with Fall? - No No -  Follow up - Education provided - -  Depression Screen PHQ 2/9 Scores 10/20/2016 08/26/2015 04/30/2015 01/10/2013  PHQ - 2 Score 0 0 0  0  PHQ- 9 Score 0 - - -     Cognitive Function MMSE - Mini Mental State Exam 11/14/2017 08/26/2015  Not completed: - (No Data)  Orientation to time 5 -  Orientation to Place 5 -  Registration 3 -  Attention/ Calculation 5 -  Recall 3 -  Language- name 2 objects 2 -  Language- repeat 1 -  Language- follow 3 step command 3 -  Language- read & follow direction 1 -  Write a sentence 1 -  Copy design 1 -  Total score 30 -        Immunization History  Administered Date(s) Administered  . Influenza Split 03/17/2011, 04/06/2012  . Influenza Whole 04/12/2005, 03/27/2008, 03/26/2009, 03/14/2010  . Influenza, High Dose Seasonal PF 03/18/2016, 03/10/2017  . Influenza,inj,Quad PF,6+ Mos 03/22/2013, 03/22/2014, 02/24/2015  . Pneumococcal Conjugate-13 02/24/2015  . Pneumococcal Polysaccharide-23 07/17/2013     Screening Tests Health Maintenance  Topic Date Due  . TETANUS/TDAP  02/04/1945  . DEXA SCAN  02/03/1993  . INFLUENZA VACCINE  01/12/2018  . PNA vac Low Risk Adult  Completed        Plan:      Continue doing brain stimulating activities (puzzles, reading, adult coloring books, staying active) to keep memory sharp.   Continue to eat heart healthy diet (full of fruits, vegetables, whole grains, lean protein, water--limit salt, fat, and sugar intake) and increase physical activity as tolerated.  I have personally reviewed and noted the following in the patient's chart:   . Medical and social history . Use of alcohol, tobacco or illicit drugs  . Current medications and supplements . Functional ability and status . Nutritional status . Physical activity . Advanced directives . List of other physicians . Vitals . Screenings to include cognitive, depression, and falls . Referrals and appointments  In addition, I have reviewed and discussed with patient certain preventive protocols, quality metrics, and best practice recommendations. A written personalized care plan for  preventive services as well as general preventive health recommendations were provided to patient.     Michiel Cowboy, RN  11/14/2017  Medical screening examination/treatment/procedure(s) were performed by non-physician practitioner and as supervising physician I was immediately available for consultation/collaboration. I agree with  above. Cathlean Cower, MD

## 2017-11-14 NOTE — Patient Instructions (Addendum)
Continue doing brain stimulating activities (puzzles, reading, adult coloring books, staying active) to keep memory sharp.   Continue to eat heart healthy diet (full of fruits, vegetables, whole grains, lean protein, water--limit salt, fat, and sugar intake) and increase physical activity as tolerated.   Deanna Olson , Thank you for taking time to come for your Medicare Wellness Visit. I appreciate your ongoing commitment to your health goals. Please review the following plan we discussed and let me know if I can assist you in the future.   These are the goals we discussed: Goals    . Exercise 150 minutes per week (moderate activity)     Walks now 30' 5 days if weather permits Will consider Silver Sneakers     . Patient Stated     I want to increase my physical activity. I would like to try [employer] fitness. Enjoy life and family.       This is a list of the screening recommended for you and due dates:  Health Maintenance  Topic Date Due  . Tetanus Vaccine  02/04/1945  . DEXA scan (bone density measurement)  02/03/1993  . Flu Shot  01/12/2018  . Pneumonia vaccines  Completed     Fat and Cholesterol Restricted Diet Getting too much fat and cholesterol in your diet may cause health problems. Following this diet helps keep your fat and cholesterol at normal levels. This can keep you from getting sick. What types of fat should I choose?  Choose monosaturated and polyunsaturated fats. These are found in foods such as olive oil, canola oil, flaxseeds, walnuts, almonds, and seeds.  Eat more omega-3 fats. Good choices include salmon, mackerel, sardines, tuna, flaxseed oil, and ground flaxseeds.  Limit saturated fats. These are in animal products such as meats, butter, and cream. They can also be in plant products such as palm oil, palm kernel oil, and coconut oil.  Avoid foods with partially hydrogenated oils in them. These contain trans fats. Examples of foods that have trans fats are stick  margarine, some tub margarines, cookies, crackers, and other baked goods. What general guidelines do I need to follow?  Check food labels. Look for the words "trans fat" and "saturated fat."  When preparing a meal: ? Fill half of your plate with vegetables and green salads. ? Fill one fourth of your plate with whole grains. Look for the word "whole" as the first word in the ingredient list. ? Fill one fourth of your plate with lean protein foods.  Eat more foods that have fiber, like apples, carrots, beans, peas, and barley.  Eat more home-cooked foods. Eat less at restaurants and buffets.  Limit or avoid alcohol.  Limit foods high in starch and sugar.  Limit fried foods.  Cook foods without frying them. Baking, boiling, grilling, and broiling are all great options.  Lose weight if you are overweight. Losing even a small amount of weight can help your overall health. It can also help prevent diseases such as diabetes and heart disease. What foods can I eat? Grains Whole grains, such as whole wheat or whole grain breads, crackers, cereals, and pasta. Unsweetened oatmeal, bulgur, barley, quinoa, or brown rice. Corn or whole wheat flour tortillas. Vegetables Fresh or frozen vegetables (raw, steamed, roasted, or grilled). Green salads. Fruits All fresh, canned (in natural juice), or frozen fruits. Meat and Other Protein Products Ground beef (85% or leaner), grass-fed beef, or beef trimmed of fat. Skinless chicken or [place]. Ground chicken or [place]. Pork  trimmed of fat. All fish and seafood. Eggs. Dried beans, peas, or lentils. Unsalted nuts or seeds. Unsalted canned or dry beans. Dairy Low-fat dairy products, such as skim or 1% milk, 2% or reduced-fat cheeses, low-fat ricotta or cottage cheese, or plain low-fat yogurt. Fats and Oils Tub margarines without trans fats. Light or reduced-fat mayonnaise and salad dressings. Avocado. Olive, canola, sesame, or safflower oils. Natural peanut  or almond butter (choose ones without added sugar and oil). The items listed above may not be a complete list of recommended foods or beverages. Contact your dietitian for more options. What foods are not recommended? Grains White bread. White pasta. White rice. Cornbread. Bagels, pastries, and croissants. Crackers that contain trans fat. Vegetables White potatoes. Corn. Creamed or fried vegetables. Vegetables in a cheese sauce. Fruits Dried fruits. Canned fruit in light or heavy syrup. Fruit juice. Meat and Other Protein Products Fatty cuts of meat. Ribs, chicken wings, bacon, sausage, bologna, salami, chitterlings, fatback, hot dogs, bratwurst, and packaged luncheon meats. Liver and organ meats. Dairy Whole or 2% milk, cream, half-and-half, and cream cheese. Whole milk cheeses. Whole-fat or sweetened yogurt. Full-fat cheeses. Nondairy creamers and whipped toppings. Processed cheese, cheese spreads, or cheese curds. Sweets and Desserts Corn syrup, sugars, honey, and molasses. Candy. Jam and jelly. Syrup. Sweetened cereals. Cookies, pies, cakes, donuts, muffins, and ice cream. Fats and Oils Butter, stick margarine, lard, shortening, ghee, or bacon fat. Coconut, palm kernel, or palm oils. Beverages Alcohol. Sweetened drinks (such as sodas, lemonade, and fruit drinks or punches). The items listed above may not be a complete list of foods and beverages to avoid. Contact your dietitian for more information. This information is not intended to replace advice given to you by your health care provider. Make sure you discuss any questions you have with your health care provider. Document Released: 11/30/2011 Document Revised: 02/05/2016 Document Reviewed: 08/30/2013 Elsevier Interactive Patient Education  [employer].  It is important to avoid accidents which may result in broken bones.  Here are a few ideas on how to make your home safer so you will be less likely to trip or fall.  1. Use  nonskid mats or non slip strips in your shower or tub, on your bathroom floor and around sinks.  If you know that you have spilled water, wipe it up! 2. In the bathroom, it is important to have properly installed grab bars on the walls or on the edge of the tub.  Towel racks are NOT strong enough for you to hold onto or to pull on for support. 3. Stairs and hallways should have enough light.  Add lamps or night lights if you need ore light. 4. It is good to have handrails on both sides of the stairs if possible.  Always fix broken handrails right away. 5. It is important to see the edges of steps.  Paint the edges of outdoor steps white so you can see them better.  Put colored tape on the edge of inside steps. 6. Throw-rugs are dangerous because they can slide.  Removing the rugs is the best idea, but if they must stay, add adhesive carpet tape to prevent slipping. 7. Do not keep things on stairs or in the halls.  Remove small furniture that blocks the halls as it may cause you to trip.  Keep telephone and electrical cords out of the way where you walk. 8. Always were sturdy, rubber-soled shoes for good support.  Never wear just socks, especially on  the stairs.  Socks may cause you to slip or fall.  Do not wear full-length housecoats as you can easily trip on the bottom.  9. Place the things you use the most on the shelves that are the easiest to reach.  If you use a stepstool, make sure it is in good condition.  If you feel unsteady, DO NOT climb, ask for help. 10. If a health professional advises you to use a cane or walker, do not be ashamed.  These items can keep you from falling and breaking your bones.

## 2017-11-21 ENCOUNTER — Ambulatory Visit (INDEPENDENT_AMBULATORY_CARE_PROVIDER_SITE_OTHER)
Admission: RE | Admit: 2017-11-21 | Discharge: 2017-11-21 | Disposition: A | Discharge status: Home / Self Care | Payer: PPO | Source: Ambulatory Visit | Attending: Internal Medicine | Admitting: Internal Medicine

## 2017-11-21 DIAGNOSIS — E2839 Other primary ovarian failure: Secondary | ICD-10-CM | POA: Diagnosis not present

## 2017-11-28 ENCOUNTER — Other Ambulatory Visit: Payer: Self-pay | Admitting: Internal Medicine

## 2017-11-28 ENCOUNTER — Telehealth: Payer: Self-pay

## 2017-11-28 ENCOUNTER — Encounter: Payer: Self-pay | Admitting: Internal Medicine

## 2017-11-28 ENCOUNTER — Telehealth: Payer: Self-pay | Admitting: Internal Medicine

## 2017-11-28 MED ORDER — ALENDRONATE SODIUM 70 MG PO TABS: 70.0000 mg | ORAL_TABLET | ORAL | 3 refills | Status: DC

## 2017-11-28 NOTE — Telephone Encounter (Signed)
Copied from [place] [id number]. Topic: Quick Communication - Rx Refill/Question >> Nov 28, 2017  9:54 AM Deanna Olson B wrote: Medication: ALPRAZolam [name]) 0.25 MG tablet [[id number]]   Has the patient contacted their pharmacy? Yes.   (Agent: If no, request that the patient contact the pharmacy for the refill.) (Agent: If yes, when and what did the pharmacy advise?)  [facility] ([facility] phone number [facility]  Agent: Please be advised that RX refills may take up to 3 business days. We ask that you follow-up with your pharmacy.

## 2017-11-28 NOTE — Telephone Encounter (Signed)
Pt has been informed and expressed understanding.  

## 2017-11-28 NOTE — Telephone Encounter (Signed)
-----   Message from Biagio Borg, MD sent at 11/28/2017  8:23 AM EDT ----- Letter sent, cont same tx except  The test results show that your current treatment is OK, except the bone density testing is consistent osteoporosis.  Please start fosamax 70 mg per week which will serve to reduce your risk of bone fractures.  I will send the prescription, and you should hear from the office as well  Shirron to please inform pt, I will do rx

## 2017-11-28 NOTE — Telephone Encounter (Signed)
Refill of Xanax  LOV 10/03/17  A. Shambley  [facility] 06/03/17  #30  5 refills  [place]

## 2017-11-28 NOTE — Telephone Encounter (Signed)
Control database checked last refill: 10/29/2017 LOV: acute 10/03/2017

## 2017-11-28 NOTE — Telephone Encounter (Signed)
Copied from [place] [phone]. Topic: Quick Communication - Lab Results >> Nov 28, 2017  9:57 AM Oliver Pila B wrote: Pt called and asked about her bone density results, call pt to advise

## 2017-11-30 NOTE — Telephone Encounter (Signed)
Ptr aware med has been sent in.

## 2017-12-07 ENCOUNTER — Other Ambulatory Visit: Payer: Self-pay | Admitting: Family

## 2017-12-14 ENCOUNTER — Other Ambulatory Visit: Payer: Self-pay

## 2017-12-14 MED ORDER — LISINOPRIL 20 MG PO TABS: 20.0000 mg | ORAL_TABLET | Freq: Every day | ORAL | 0 refills | Status: DC

## 2017-12-24 ENCOUNTER — Other Ambulatory Visit: Payer: Self-pay | Admitting: Internal Medicine

## 2017-12-26 ENCOUNTER — Other Ambulatory Visit: Payer: Self-pay | Admitting: Internal Medicine

## 2018-01-03 ENCOUNTER — Other Ambulatory Visit: Payer: Self-pay | Admitting: Internal Medicine

## 2018-01-05 ENCOUNTER — Other Ambulatory Visit: Payer: Self-pay | Admitting: Internal Medicine

## 2018-01-07 ENCOUNTER — Other Ambulatory Visit: Payer: Self-pay | Admitting: Internal Medicine

## 2018-01-13 ENCOUNTER — Telehealth: Payer: Self-pay | Admitting: Internal Medicine

## 2018-01-13 NOTE — Telephone Encounter (Signed)
Copied from [place] [id number] [phone]. Topic: Quick Communication - Rx Refill/Question >> Jan 13, 2018  4:27 PM Neva Seat wrote: lisinopril (PRINIVIL,ZESTRIL) 20 MG tablet  Wanting refills  [id number] [place] [place] - [address] [place] [address] Phone: [phone] Fax: [phone]

## 2018-01-13 NOTE — Telephone Encounter (Signed)
Lisinopril filled on 7/29. [facility] and spoke with [place] who states that the pt came to the pharmacy upset and fussing at the staff because she states she did not have a prescription for synthroid Prescription for Synthroid was sent to the pharmacy on 7/29 along with prescription for Lisinopril.Pt's prescription for Lisinopril is currently ready and available for pickup but pharmacy states that the pt did not pick up the medication when she picked up Levothyroxine today.

## 2018-02-06 ENCOUNTER — Encounter: Payer: Self-pay | Admitting: Internal Medicine

## 2018-02-06 ENCOUNTER — Ambulatory Visit (INDEPENDENT_AMBULATORY_CARE_PROVIDER_SITE_OTHER): Payer: PPO | Admitting: Internal Medicine

## 2018-02-06 VITALS — BP 120/80 | HR 64 | Temp 97.9°F | Ht 64.0 in | Wt 165.0 lb

## 2018-02-06 DIAGNOSIS — M545 Low back pain: Secondary | ICD-10-CM | POA: Diagnosis not present

## 2018-02-06 MED ORDER — KETOROLAC TROMETHAMINE 30 MG/ML IJ SOLN
30.0000 mg | Freq: Once | INTRAMUSCULAR | Status: AC
  Administered 2018-02-06: 30 mg via INTRAMUSCULAR

## 2018-02-06 MED ORDER — METHYLPREDNISOLONE ACETATE 40 MG/ML IJ SUSP
40.0000 mg | Freq: Once | INTRAMUSCULAR | Status: AC
  Administered 2018-02-06: 40 mg via INTRAMUSCULAR

## 2018-02-06 MED ORDER — PREDNISONE 20 MG PO TABS
40.0000 mg | ORAL_TABLET | Freq: Every day | ORAL | 0 refills | Status: DC
Start: 2018-02-06 — End: 2018-06-16

## 2018-02-06 NOTE — Progress Notes (Signed)
   Subjective:    Patient ID: Deanna Olson, female    DOB: [birth year], 84 y.o.   MRN: [id number]  HPI The patient is a 84 YO female coming in for low back pain. Going on for 2-3 days. Worse with trying to stand up. Some with sitting for too long. A little better with standing. She denies injury or overuse at the onset. Denies fevers or chills. Denies falls or trauma. She has tried heating pad which is not helping much. Denies pain down her legs. Denies change in bowels or bladder. Pain is stable since onset. No radiation of the pain.  Review of Systems  Constitutional: Positive for activity change. Negative for appetite change, chills, fatigue, fever and unexpected weight change.  Respiratory: Negative.   Cardiovascular: Negative.   Gastrointestinal: Negative.   Musculoskeletal: Positive for arthralgias, back pain and myalgias. Negative for gait problem and joint swelling.  Skin: Negative.   Neurological: Negative.       Objective:   Physical Exam  Constitutional: She is oriented to person, place, and time. She appears well-developed and well-nourished.  HENT:  Head: Normocephalic and atraumatic.  Eyes: EOM are normal.  Neck: Normal range of motion.  Cardiovascular: Normal rate and regular rhythm.  Pulmonary/Chest: Effort normal and breath sounds normal. No respiratory distress. She has no wheezes. She has no rales.  Musculoskeletal: She exhibits tenderness. She exhibits no edema.  Pain in the lumbar region band like pattern  Neurological: She is alert and oriented to person, place, and time. Coordination normal.  Skin: Skin is warm and dry.   Vitals:   02/06/18 1546  BP: 120/80  Pulse: 64  Temp: 97.9 F (36.6 C)  TempSrc: Oral  SpO2: 95%  Weight: 165 lb (74.8 kg)  Height: 5\' 4"  (1.626 m)      Assessment & Plan:  Depo-medrol 40 mg IM and toradol 30 mg IM given at visit.

## 2018-02-06 NOTE — Patient Instructions (Signed)
We have given you 2 shots today to help with the pain.   We have sent in prednisone to take 2 pills daily starting today for the pain.  Call us back in 2-3 days and let us know how you are doing.

## 2018-02-07 ENCOUNTER — Other Ambulatory Visit: Payer: Self-pay | Admitting: Internal Medicine

## 2018-02-07 NOTE — Assessment & Plan Note (Signed)
Given toradol 30 mg and depo-medrol 40 mg IM at visit as well as prednisone burst for the back pain. Advised she can try tylenol over the counter as well for pain.

## 2018-02-08 ENCOUNTER — Encounter: Payer: Self-pay | Admitting: Internal Medicine

## 2018-02-08 ENCOUNTER — Ambulatory Visit (INDEPENDENT_AMBULATORY_CARE_PROVIDER_SITE_OTHER)
Admission: RE | Admit: 2018-02-08 | Discharge: 2018-02-08 | Disposition: A | Discharge status: Home / Self Care | Payer: PPO | Source: Ambulatory Visit | Attending: Internal Medicine | Admitting: Internal Medicine

## 2018-02-08 ENCOUNTER — Ambulatory Visit (INDEPENDENT_AMBULATORY_CARE_PROVIDER_SITE_OTHER): Payer: PPO | Admitting: Internal Medicine

## 2018-02-08 ENCOUNTER — Telehealth: Payer: Self-pay | Admitting: Internal Medicine

## 2018-02-08 VITALS — BP 130/70 | HR 72 | Temp 97.7°F | Ht 64.0 in | Wt 165.0 lb

## 2018-02-08 DIAGNOSIS — M545 Low back pain, unspecified: Secondary | ICD-10-CM

## 2018-02-08 DIAGNOSIS — S32038A Other fracture of third lumbar vertebra, initial encounter for closed fracture: Secondary | ICD-10-CM | POA: Diagnosis not present

## 2018-02-08 MED ORDER — KETOROLAC TROMETHAMINE 30 MG/ML IJ SOLN
30.0000 mg | Freq: Once | INTRAMUSCULAR | Status: AC
  Administered 2018-02-08: 30 mg via INTRAMUSCULAR

## 2018-02-08 MED ORDER — METHOCARBAMOL 500 MG PO TABS: 500.0000 mg | ORAL_TABLET | Freq: Three times a day (TID) | ORAL | 0 refills | Status: DC | PRN

## 2018-02-08 NOTE — Patient Instructions (Signed)
We have given you another shot for pain today which should help temporarily.   We have sent in a muscle relaxer which you can take up to 3 times per day.  We are checking an x-ray today.

## 2018-02-08 NOTE — Progress Notes (Signed)
   Subjective:    Patient ID: Deanna Olson, female    DOB: 1926-05-30, 84 y.o.   MRN: [id number]  HPI The patient is a 84 YO female coming in for back pain. She was seen 2 days ago and given toradol injection and depo-medrol injection as well as 5 day course of prednisone. She is returning for continuing severe pain. She has noticed no improvement. Still feels better with walking. Hurts to try to stand and sitting. Not sleeping well at all. Pain 10/10 pain. Tried tramadol and ibuprofen and tylenol and heat at home without relief. The injection did help some but wore off as the day went on.   Review of Systems  Constitutional: Positive for activity change. Negative for appetite change, chills, fatigue, fever and unexpected weight change.  Respiratory: Negative.   Cardiovascular: Negative.   Gastrointestinal: Negative.   Musculoskeletal: Positive for arthralgias, back pain, gait problem and myalgias. Negative for joint swelling.  Skin: Negative.       Objective:   Physical Exam  Constitutional: She is oriented to person, place, and time. She appears well-developed and well-nourished. She appears distressed.  HENT:  Head: Normocephalic and atraumatic.  Eyes: EOM are normal.  Neck: Normal range of motion.  Cardiovascular: Normal rate and regular rhythm.  Pulmonary/Chest: Effort normal and breath sounds normal. No respiratory distress. She has no wheezes. She has no rales.  Musculoskeletal: She exhibits tenderness. She exhibits no edema.  Pain lumbar in a band L3-L5 region  Neurological: She is alert and oriented to person, place, and time. Coordination normal.  Skin: Skin is warm and dry.   Vitals:   02/08/18 0943  BP: 130/70  Pulse: 72  Temp: 97.7 F (36.5 C)  TempSrc: Oral  SpO2: 97%  Weight: 165 lb (74.8 kg)  Height: 5\' 4"  (1.626 m)      Assessment & Plan:  Toradol 30 mg IM given at visit.

## 2018-02-08 NOTE — Assessment & Plan Note (Signed)
X-ray lumbar ordered today to rule out compression fracture (has osteoporosis). Rx for methocarbamol today and given toradol IM 30 mg as it was helpful previously.

## 2018-02-08 NOTE — Telephone Encounter (Signed)
Copied from [place] [id number]. Topic: Quick Communication - Other Results >> Feb 08, 2018  4:59 PM Cecelia Byars, NT wrote: Patient called and would like results from the x ray  she had done today as soon as possible .

## 2018-02-09 ENCOUNTER — Other Ambulatory Visit: Payer: Self-pay | Admitting: Internal Medicine

## 2018-02-09 DIAGNOSIS — S32030A Wedge compression fracture of third lumbar vertebra, initial encounter for closed fracture: Secondary | ICD-10-CM

## 2018-02-10 ENCOUNTER — Telehealth: Payer: Self-pay | Admitting: Internal Medicine

## 2018-02-10 DIAGNOSIS — S32030A Wedge compression fracture of third lumbar vertebra, initial encounter for closed fracture: Secondary | ICD-10-CM

## 2018-02-10 NOTE — Telephone Encounter (Signed)
Faxed referral to [facility] They will contact pt Pt's dtr aware

## 2018-02-10 NOTE — Telephone Encounter (Signed)
Copied from [place] [id number]. Topic: Referral - Request >> Feb 10, 2018 10:13 AM Mylinda Latina, NT wrote: Reason for CRM: Patient daughter called and states the patient was referred to a neurosurgery and they have no open appt until Sept 16. She is wondering is there any other providers that the patient can see sooner than Sept 16. Please call patient daughter to with this referral information . CB# [phone] Jeani Hawking) on [facility]

## 2018-02-12 HISTORY — PX: KYPHOPLASTY: SHX5884

## 2018-02-14 DIAGNOSIS — M4807 Spinal stenosis, lumbosacral region: Secondary | ICD-10-CM | POA: Diagnosis not present

## 2018-02-14 DIAGNOSIS — M48061 Spinal stenosis, lumbar region without neurogenic claudication: Secondary | ICD-10-CM | POA: Diagnosis not present

## 2018-02-14 DIAGNOSIS — M5126 Other intervertebral disc displacement, lumbar region: Secondary | ICD-10-CM | POA: Diagnosis not present

## 2018-02-14 DIAGNOSIS — M47896 Other spondylosis, lumbar region: Secondary | ICD-10-CM | POA: Diagnosis not present

## 2018-02-14 DIAGNOSIS — M5136 Other intervertebral disc degeneration, lumbar region: Secondary | ICD-10-CM | POA: Diagnosis not present

## 2018-02-14 NOTE — Telephone Encounter (Signed)
Patient's daughter is calling is was able to get an appointment for her mother was the [facility] on Thursday February 16, 2018.  However, prior to [facility] she does need to come in and pick up th dics of her x-rays ordered by Dr. Sharlet Salina.  Patient's daughter would like to know when to pick these up?  The [facility] is also requesting a MRI.  Patient's daughter would like a refferal for a MRI that is with her mothers network.  Her insurance is Medicare and [employer].  Her appointment with the [facility] is Thursday February 17, 2108 at 3pm.   Patients Daughter, Loren Racer would like a callback [phone]

## 2018-02-14 NOTE — Telephone Encounter (Signed)
Patient has an appointment and has been contacted by Cecille Rubin

## 2018-02-14 NOTE — Telephone Encounter (Signed)
Called patient's daughter and transferred her to medical records regarding the copy of x-rays. She also asked about the MRI order. She states that the patient must have an MRI prior to her visit with the [facility]. Is this something that Dr Sharlet Salina can order for the patient? She said that she would like this done as soon as possible since her appointment is scheduled for this Thursday.

## 2018-02-14 NOTE — Telephone Encounter (Signed)
MRI placed

## 2018-02-16 DIAGNOSIS — M8008XA Age-related osteoporosis with current pathological fracture, vertebra(e), initial encounter for fracture: Secondary | ICD-10-CM | POA: Diagnosis not present

## 2018-02-16 DIAGNOSIS — Z6828 Body mass index (BMI) 28.0-28.9, adult: Secondary | ICD-10-CM | POA: Diagnosis not present

## 2018-02-20 ENCOUNTER — Ambulatory Visit: Payer: Self-pay | Admitting: Internal Medicine

## 2018-02-20 ENCOUNTER — Encounter: Payer: Self-pay | Admitting: Internal Medicine

## 2018-02-20 ENCOUNTER — Ambulatory Visit (INDEPENDENT_AMBULATORY_CARE_PROVIDER_SITE_OTHER): Payer: PPO | Admitting: Internal Medicine

## 2018-02-20 DIAGNOSIS — M8000XA Age-related osteoporosis with current pathological fracture, unspecified site, initial encounter for fracture: Secondary | ICD-10-CM | POA: Diagnosis not present

## 2018-02-20 DIAGNOSIS — F411 Generalized anxiety disorder: Secondary | ICD-10-CM | POA: Diagnosis not present

## 2018-02-20 DIAGNOSIS — R059 Cough, unspecified: Secondary | ICD-10-CM

## 2018-02-20 DIAGNOSIS — R05 Cough: Secondary | ICD-10-CM

## 2018-02-20 DIAGNOSIS — M810 Age-related osteoporosis without current pathological fracture: Secondary | ICD-10-CM | POA: Insufficient documentation

## 2018-02-20 MED ORDER — DOXYCYCLINE HYCLATE 100 MG PO TABS: 100.0000 mg | ORAL_TABLET | Freq: Two times a day (BID) | ORAL | 0 refills | Status: DC

## 2018-02-20 MED ORDER — ALPRAZOLAM 0.5 MG PO TABS: 0.5000 mg | ORAL_TABLET | Freq: Two times a day (BID) | ORAL | 3 refills | Status: DC

## 2018-02-20 NOTE — Assessment & Plan Note (Signed)
Rx for doxycycline given the chronicity and lung changes on exam. Suspect bronchitis. Will be okay for kyphoplasty on Friday.

## 2018-02-20 NOTE — Assessment & Plan Note (Signed)
With lumbar compression fracture. Will try to get prolia or evenity for treatment of her osteoporosis. She has tried fosamax and had fracture while on this.

## 2018-02-20 NOTE — Assessment & Plan Note (Signed)
Will temporarily increase xanax to 0.5 mg BID prn for anxiety or sleep with her recent compression fracture.

## 2018-02-20 NOTE — Progress Notes (Signed)
   Subjective:    Patient ID: Deanna Olson, female    DOB: 1926-01-17, 84 y.o.   MRN: [id number]  HPI The patient is a 84 YO female coming in for several concerns including insomnia (since her vertebral fracture, having severe pain, sleeping on the cough as this is tolerable, she has not been getting relief from xanax to sleep, only sleep a few hours, took a stronger dose of xanax from a relative and this helped her get 5 hours of sleep), and fracture (has osteoporosis, started on fosamax several months ago, has not been taking lately, needs some treatment she can remember to take, getting kyphoplasty on Friday), and cough (coughing up gray sputum for the last several weeks, denies SOB, using incentive spirometer from a friend which she uses, denies fevers or chills, denies chest tightness or pressure, taking delsym which is not helping, she is overall stable since onset, started 2-3 weeks ago).   Review of Systems  Constitutional: Positive for activity change and appetite change. Negative for chills, fatigue, fever and unexpected weight change.  HENT: Negative for congestion, ear discharge, ear pain, postnasal drip, rhinorrhea, sinus pressure, sinus pain, sneezing, sore throat, tinnitus, trouble swallowing and voice change.   Eyes: Negative.   Respiratory: Positive for cough. Negative for chest tightness, shortness of breath and wheezing.   Cardiovascular: Negative.  Negative for chest pain, palpitations and leg swelling.  Gastrointestinal: Negative.  Negative for abdominal distention, abdominal pain, constipation, diarrhea, nausea and vomiting.  Musculoskeletal: Positive for back pain and myalgias. Negative for arthralgias, gait problem, joint swelling, neck pain and neck stiffness.  Skin: Negative.   Neurological: Negative.   Psychiatric/Behavioral: Positive for sleep disturbance.      Objective:   Physical Exam  Constitutional: She is oriented to person, place, and time. She appears  well-developed and well-nourished.  HENT:  Head: Normocephalic and atraumatic.  Eyes: EOM are normal.  Neck: Normal range of motion.  Cardiovascular: Normal rate and regular rhythm.  Pulmonary/Chest: Effort normal and breath sounds normal. No respiratory distress. She has no wheezes. She has no rales.  Some rhonchi in right base, partially clear with coughing  Abdominal: Soft. Bowel sounds are normal. She exhibits no distension. There is no tenderness. There is no rebound.  Musculoskeletal: She exhibits tenderness. She exhibits no edema.  Pain lumbar spine  Neurological: She is alert and oriented to person, place, and time. Coordination normal.  Skin: Skin is warm and dry.  Psychiatric: She has a normal mood and affect.   Vitals:   02/20/18 0933  BP: 122/72  Pulse: 62  Temp: 98.2 F (36.8 C)  TempSrc: Oral  SpO2: 96%  Weight: 158 lb (71.7 kg)  Height: 5\' 4"  (1.626 m)      Assessment & Plan:

## 2018-02-20 NOTE — Telephone Encounter (Signed)
Daughter Deanna Olson called in for her mother.   She was not with her mother.   Her sister was with her mother this morning but is at work now but sister called Deanna Olson prior to going to work and told her about Deanna Olson falling last night.   I'm getting information relayed to me from the sister to [place] to me.  Daughter handles all the medical needs because her mother can't navigate the phone system.   Per daughter pt is alert and oriented and did not lose consciousness.  Per Deanna Olson:  Pt has been sleeping in the den due to a compression fracture she has that is going to be evaluated at the [facility] this Friday.    Pt got up during the night with her walker to go to the bathroom and fell hitting her head on the TV stand.  Deanna Olson did not know where her head hit only that the sister said there was a knot on her head.   She is not on blood thinners per Deanna Olson.   Doesn't know if she is c/o a headache or not.   Sister did not mention it.  There was no bleeding from her head from the fall. Deanna Olson wants to talk with Deanna Olson about the medications Deanna Olson is on and see if she really needs to be on all of them as well as have her head checked.  I scheduled her with Deanna Olson for today at 9:40.   They can be at the office by 9:25 to be registered per [facility].      Reason for Disposition . [1] Age over 61 years AND [2] swelling or bruise  Answer Assessment - Initial Assessment Questions 1. MECHANISM: "How did the injury happen?" For falls, ask: "What height did you fall from?" and "What surface did you fall against?"      She got up during the night with her walker and fell and hit her head on the TV stand.   No blood just a knot.   She has a compression fracture.   She can't get to the bathroom fast enough due to the fracture.   No loss of consciousness.  2. ONSET: "When did the injury happen?" (Minutes or hours ago)      Last night 3. NEUROLOGIC SYMPTOMS: "Was there any loss of consciousness?" "Are there any  other neurological symptoms?"      No 4. MENTAL STATUS: "Does the person know who he is, who you are, and where he is?"      Yes 5. LOCATION: "What part of the head was hit?"      I don't know per daughter. 6. SCALP APPEARANCE: "What does the scalp look like? Is it bleeding now?" If so, ask: "Is it difficult to stop?"      Don't know.    Sister was with number. 7. SIZE: For cuts, bruises, or swelling, ask: "How large is it?" (e.g., inches or centimeters)      Knot 8. PAIN: "Is there any pain?" If so, ask: "How bad is it?"  (e.g., Scale 1-10; or mild, moderate, severe)     Don;t know 9. TETANUS: For any breaks in the skin, ask: "When was the last tetanus booster?"     *No Answer* 10. OTHER SYMPTOMS: "Do you have any other symptoms?" (e.g., neck pain, vomiting)       No 11. PREGNANCY: "Is there any chance you are pregnant?" "When was your last menstrual period?"  Not asked  Protocols used: HEAD INJURY-A-AH

## 2018-02-20 NOTE — Patient Instructions (Signed)
We have sent in alprazolam 0.5 mg to take twice a day as needed.   We have sent in doxycyline to take 1 pill twice a day for 1 week.   We are working on prolia or another one which is slightly better called evenity.

## 2018-02-20 NOTE — Telephone Encounter (Signed)
noted 

## 2018-02-24 DIAGNOSIS — M8008XA Age-related osteoporosis with current pathological fracture, vertebra(e), initial encounter for fracture: Secondary | ICD-10-CM | POA: Diagnosis not present

## 2018-02-27 MED ORDER — GABAPENTIN 300 MG PO CAPS: 300.0000 mg | ORAL_CAPSULE | Freq: Two times a day (BID) | ORAL | 0 refills | Status: DC

## 2018-02-27 NOTE — Telephone Encounter (Signed)
Patient had her procedure Friday. States mother does not feel tingling down her leg but still in pain and has not slept really at all or any sleep that would be of any value for about 2 weeks. Patient wakes up probably every two hours and has to go to the bathroom every time daughter worried about UTI. Daughter wants to know if she can bring in a urine sample or get an in home nurse to help with mother as daughter is having a hard time keeping up with patient since everything has happened. Daughters main concern really is that mother is not sleeping very well at all and is not able to get comfortable at all. Patient is worried that she is not pooping everyday and is using suppositories or stool softeners everyday and states sister gave patient an anima yesterday. Patient is also having a dry mouth and is wondering if the medication for the muscle spasms and the ABX could cause that.

## 2018-02-27 NOTE — Telephone Encounter (Signed)
LVM informing daughter of MD response 

## 2018-02-27 NOTE — Addendum Note (Signed)
Addended by: Pricilla Holm A on: 02/27/2018 04:13 PM   Modules accepted: Orders

## 2018-02-27 NOTE — Telephone Encounter (Signed)
Pt daughter called and stated that pt is in a lot of pain still. Pt daughter states that she is not sleeping and she would like a call back from the nurse. Please advise

## 2018-02-27 NOTE — Telephone Encounter (Signed)
Has she had her procedure yet?

## 2018-02-27 NOTE — Telephone Encounter (Signed)
We could try gabapentin for pain if they want. We have sent this in and she can take 1 pill in the evening for the first 3 days and then 1 pill twice a day as needed for pain. We have put in the order for the urine test but it is unlikely she has a urine infection while on the antibiotic as this likely would have covered any bacteria in her urine.

## 2018-02-28 ENCOUNTER — Other Ambulatory Visit (INDEPENDENT_AMBULATORY_CARE_PROVIDER_SITE_OTHER): Payer: PPO

## 2018-02-28 DIAGNOSIS — S32030A Wedge compression fracture of third lumbar vertebra, initial encounter for closed fracture: Secondary | ICD-10-CM | POA: Diagnosis not present

## 2018-02-28 LAB — URINALYSIS, ROUTINE W REFLEX MICROSCOPIC
BILIRUBIN URINE: NEGATIVE
HGB URINE DIPSTICK: NEGATIVE
KETONES UR: NEGATIVE
LEUKOCYTES UA: NEGATIVE
Nitrite: NEGATIVE
RBC / HPF: NONE SEEN (ref 0–?)
Specific Gravity, Urine: 1.015 (ref 1.000–1.030)
TOTAL PROTEIN, URINE-UPE24: NEGATIVE
URINE GLUCOSE: NEGATIVE
UROBILINOGEN UA: 0.2 (ref 0.0–1.0)
pH: 5.5 (ref 5.0–8.0)

## 2018-03-02 ENCOUNTER — Other Ambulatory Visit: Payer: Self-pay | Admitting: Internal Medicine

## 2018-03-06 ENCOUNTER — Other Ambulatory Visit: Payer: Self-pay | Admitting: Internal Medicine

## 2018-03-07 ENCOUNTER — Telehealth: Payer: Self-pay | Admitting: Internal Medicine

## 2018-03-07 NOTE — Telephone Encounter (Signed)
Yes, this is ok as long as she is not sedated or otherwise "goofy"

## 2018-03-07 NOTE — Telephone Encounter (Signed)
Copied from [place] [id number]. Topic: Quick Communication - See Telephone Encounter >> Mar 07, 2018  4:37 PM Bea Graff, NT wrote: CRM for notification. See Telephone encounter for: 03/07/18. Pts daughter calling and states mom has been on methocarbamol (ROBAXIN) 500 MG tablet  and also on gabapentin and wants to see if its ok for her mom to take both of these medications. Please advise.

## 2018-03-07 NOTE — Telephone Encounter (Signed)
Please advise per Dr. Crawford's absence. Thank you  

## 2018-03-08 NOTE — Telephone Encounter (Signed)
Left Daughter a VM informing her of providers response

## 2018-03-17 DIAGNOSIS — M8008XD Age-related osteoporosis with current pathological fracture, vertebra(e), subsequent encounter for fracture with routine healing: Secondary | ICD-10-CM | POA: Diagnosis not present

## 2018-03-20 ENCOUNTER — Other Ambulatory Visit: Payer: Self-pay | Admitting: Internal Medicine

## 2018-03-22 ENCOUNTER — Other Ambulatory Visit: Payer: Self-pay | Admitting: Internal Medicine

## 2018-03-23 ENCOUNTER — Other Ambulatory Visit: Payer: Self-pay | Admitting: Internal Medicine

## 2018-03-27 ENCOUNTER — Telehealth: Payer: Self-pay

## 2018-03-27 NOTE — Telephone Encounter (Signed)
Copied from [place] [id number]. Topic: Quick Communication - See Telephone Encounter >> Mar 27, 2018  3:54 PM Antonieta Iba C wrote: CRM for notification. See Telephone encounter for: 03/27/18.  Pt's daughter is calling in to inquire. She is taking 2 medications methocarbamol (ROBAXIN) 500 MG tablet  and also gabapentin (NEURONTIN) 300 MG capsule. Daughter would like to know if mom should still be taking medication, if so pt needs a refill on both, please advise.   [facility]armacy[id number] [address], [place] (Phone) [phone] (Fax)   CB: [phone] Jeani Hawking  pt's daughter

## 2018-03-28 NOTE — Telephone Encounter (Signed)
LVM for daughter to call back and let us know if mother is still experiencing back pain. The gabapentin and methocarbamol are as needed medications for back pain

## 2018-03-28 NOTE — Telephone Encounter (Signed)
These are both as needed medications for her back pain. Is she still having back pain?

## 2018-04-04 ENCOUNTER — Other Ambulatory Visit: Payer: Self-pay | Admitting: Internal Medicine

## 2018-04-05 ENCOUNTER — Telehealth: Payer: Self-pay

## 2018-04-05 NOTE — Telephone Encounter (Signed)
Patient needs to start prolia injections per dr Sharlet Salina, recent back fx----insurance summary of benefits received and patient will owe estimated $250 copay---patient advised, she will call back to schedule nurse visit toward the end of November to get started with first prolia injection---can talk with tamara,RN at [facility] office if any further questions

## 2018-04-06 ENCOUNTER — Other Ambulatory Visit: Payer: Self-pay | Admitting: Internal Medicine

## 2018-04-11 ENCOUNTER — Other Ambulatory Visit: Payer: Self-pay | Admitting: Internal Medicine

## 2018-04-14 DIAGNOSIS — H04123 Dry eye syndrome of bilateral lacrimal glands: Secondary | ICD-10-CM | POA: Diagnosis not present

## 2018-04-14 DIAGNOSIS — H353132 Nonexudative age-related macular degeneration, bilateral, intermediate dry stage: Secondary | ICD-10-CM | POA: Diagnosis not present

## 2018-04-14 DIAGNOSIS — Z961 Presence of intraocular lens: Secondary | ICD-10-CM | POA: Diagnosis not present

## 2018-04-14 DIAGNOSIS — H43813 Vitreous degeneration, bilateral: Secondary | ICD-10-CM | POA: Diagnosis not present

## 2018-04-18 ENCOUNTER — Other Ambulatory Visit: Payer: Self-pay | Admitting: Internal Medicine

## 2018-05-15 ENCOUNTER — Other Ambulatory Visit: Payer: Self-pay | Admitting: Internal Medicine

## 2018-05-16 MED ORDER — TRIAMCINOLONE ACETONIDE 0.1 % EX CREA: 1.0000 "application " | TOPICAL_CREAM | Freq: Two times a day (BID) | CUTANEOUS | 0 refills | Status: DC

## 2018-05-16 MED ORDER — NYSTATIN 100000 UNIT/GM EX CREA: 1.0000 "application " | TOPICAL_CREAM | Freq: Two times a day (BID) | CUTANEOUS | 0 refills | Status: DC

## 2018-05-16 NOTE — Addendum Note (Signed)
Addended by: Pricilla Holm A on: 05/16/2018 03:37 PM   Modules accepted: Orders

## 2018-05-16 NOTE — Telephone Encounter (Signed)
Patient states that this medication is very expensive. The patient's daughter spoke with the pharmacy who said that she should check with [place] to see if something else can be called in that is not compounded.  Please advise.

## 2018-05-16 NOTE — Telephone Encounter (Signed)
Sent in two separate creams to use both twice a day.

## 2018-05-19 ENCOUNTER — Other Ambulatory Visit: Payer: Self-pay | Admitting: Internal Medicine

## 2018-05-24 ENCOUNTER — Ambulatory Visit: Payer: Self-pay | Admitting: *Deleted

## 2018-05-24 ENCOUNTER — Other Ambulatory Visit: Payer: Self-pay | Admitting: Internal Medicine

## 2018-05-24 NOTE — Telephone Encounter (Signed)
Copied from [place] [id number] [id number]. Topic: Quick Communication - Rx Refill/Question >> May 24, 2018 10:37 AM Yvette Rack wrote: Medication: ALPRAZolam [name]) 0.5 MG tablet  Has the patient contacted their pharmacy? Yes.   (Agent: If no, request that the patient contact the pharmacy for the refill.) (Agent: If yes, when and what did the pharmacy advise?) they told her to call provider no more refills pt states that she should have more refills that what it says on her bottle  Preferred Pharmacy (with phone number or street name):     [id number] [address], [place] [phone] (Phone) [phone] (Fax)    Agent: Please be advised that RX refills may take up to 3 business days. We ask that you follow-up with your pharmacy.

## 2018-05-24 NOTE — Telephone Encounter (Signed)
Returned call to patient regarding getting her methocarbamol. She stated that she is out of it. When checking her chart, she has a filled prescription at her pharmacy. Her daughter will go by and get it today.

## 2018-05-24 NOTE — Telephone Encounter (Signed)
noted 

## 2018-05-26 ENCOUNTER — Telehealth: Payer: Self-pay | Admitting: Internal Medicine

## 2018-05-26 MED ORDER — ALPRAZOLAM 0.5 MG PO TABS: 0.5000 mg | ORAL_TABLET | Freq: Two times a day (BID) | ORAL | 0 refills | Status: DC

## 2018-05-26 NOTE — Telephone Encounter (Signed)
Copied from [place] [id number]. Topic: Quick Communication - See Telephone Encounter >> May 26, 2018  3:32 PM Bea Graff, NT wrote: CRM for notification. See Telephone encounter for: 05/26/18. Neoma Laming with [facility] calling to ask an additional question for the PA for pts med methocarbamol (ROBAXIN) 500 MG tablet. CB#: [phone] Ref#: [id number]

## 2018-05-26 NOTE — Telephone Encounter (Signed)
Copied from [place] [id number]. Topic: Quick Communication - See Telephone Encounter >> May 26, 2018  7:00 PM [facility] A wrote: CRM for notification. See Telephone encounter for: 05/26/18. Sarah from [employer] is requesting a prior auth for methocarbamol (ROBAXIN) 500 MG tablet [id number]   Please advise [phone] option 3 Reference Number [id number]

## 2018-05-26 NOTE — Telephone Encounter (Signed)
Called pharmacy to see when patients last refill was and they stated 04/17/2018 and that they are on back order at all [facility]. I called patient and informed her that she will have to call other pharmacy's around her to see if they have any in stock and call us back and let us know. Patient stated understanding. Sending to [facility] for when patient calls back

## 2018-05-26 NOTE — Telephone Encounter (Signed)
Have sent in 1 month supply to [facility] but she should still have 2 refills left at prior pharmacy per [place] controlled substance database [facility] for after they get back in stock.

## 2018-05-26 NOTE — Telephone Encounter (Signed)
Pt called and stated Deanna Olson on [address] [facility] has medication in stock and would like for RX to be sent there. Please advise.

## 2018-05-26 NOTE — Telephone Encounter (Signed)
treid calling number several times and it would ring a few times and then go to a dial tone. Will try again Monday

## 2018-05-29 NOTE — Telephone Encounter (Signed)
Approved from 05/27/2018-06/14/2019

## 2018-05-29 NOTE — Telephone Encounter (Signed)
Called back and PA was approved they are faxing an approval letter

## 2018-06-16 ENCOUNTER — Encounter: Payer: Self-pay | Admitting: Internal Medicine

## 2018-06-16 ENCOUNTER — Ambulatory Visit (INDEPENDENT_AMBULATORY_CARE_PROVIDER_SITE_OTHER)
Admission: RE | Admit: 2018-06-16 | Discharge: 2018-06-16 | Disposition: A | Discharge status: Home / Self Care | Payer: PPO | Source: Ambulatory Visit | Attending: Internal Medicine | Admitting: Internal Medicine

## 2018-06-16 ENCOUNTER — Other Ambulatory Visit: Payer: Self-pay | Admitting: Internal Medicine

## 2018-06-16 ENCOUNTER — Ambulatory Visit (INDEPENDENT_AMBULATORY_CARE_PROVIDER_SITE_OTHER): Payer: PPO | Admitting: Internal Medicine

## 2018-06-16 VITALS — BP 140/80 | HR 60 | Temp 97.9°F | Ht 64.0 in | Wt 161.0 lb

## 2018-06-16 DIAGNOSIS — S32030S Wedge compression fracture of third lumbar vertebra, sequela: Secondary | ICD-10-CM | POA: Diagnosis not present

## 2018-06-16 DIAGNOSIS — Z23 Encounter for immunization: Secondary | ICD-10-CM | POA: Diagnosis not present

## 2018-06-16 DIAGNOSIS — S32030A Wedge compression fracture of third lumbar vertebra, initial encounter for closed fracture: Secondary | ICD-10-CM | POA: Diagnosis not present

## 2018-06-16 MED ORDER — OMEPRAZOLE 20 MG PO CPDR: 20.0000 mg | DELAYED_RELEASE_CAPSULE | Freq: Every day | ORAL | 3 refills | Status: DC

## 2018-06-16 MED ORDER — PRAVASTATIN SODIUM 20 MG PO TABS: 20.0000 mg | ORAL_TABLET | Freq: Every day | ORAL | 3 refills | Status: DC

## 2018-06-16 NOTE — Progress Notes (Signed)
   Subjective:   Patient ID: Deanna Olson, female    DOB: [birth year], 85 y.o.   MRN: [id number]  HPI The patient is a 85 YO female coming in for L3 compression fracture follow up. They did decide not to switch to prolia as instructed although she is now taking fosamax regularly. She denies new falls. Pain is gradually improving since 3 months ago. She had a kyphoplasty and is not sure that this was very helpful. She is now able to sit and sleep okay. Pain mostly when standing and walking. She is taking muscle relaxer TID and gabapentin BID and does not notice any benefit in the pain without 1 hour of taking any of these medications. She has taken tylenol rarely and thinks that this helps some. She does have some gait instability but is very careful. She is not driving anymore and this is impacting her independence and she is frustrated about this.   Review of Systems  Constitutional: Positive for activity change. Negative for appetite change, chills, fatigue, fever and unexpected weight change.  HENT: Negative.   Eyes: Negative.   Respiratory: Negative.  Negative for cough, chest tightness and shortness of breath.   Cardiovascular: Negative.  Negative for chest pain, palpitations and leg swelling.  Gastrointestinal: Negative.  Negative for abdominal distention, abdominal pain, constipation, diarrhea, nausea and vomiting.  Musculoskeletal: Positive for arthralgias, back pain and myalgias. Negative for gait problem and joint swelling.  Skin: Negative.   Neurological: Negative.   Psychiatric/Behavioral: Negative.     Objective:  Physical Exam Constitutional:      Appearance: She is well-developed.  HENT:     Head: Normocephalic and atraumatic.  Neck:     Musculoskeletal: Normal range of motion.  Cardiovascular:     Rate and Rhythm: Normal rate and regular rhythm.  Pulmonary:     Effort: Pulmonary effort is normal. No respiratory distress.     Breath sounds: Normal breath sounds. No  wheezing or rales.  Abdominal:     General: Bowel sounds are normal. There is no distension.     Palpations: Abdomen is soft.     Tenderness: There is no abdominal tenderness. There is no rebound.  Musculoskeletal:        General: Tenderness present.     Comments: Pain L2-L5 diffuse and mostly midline  Skin:    General: Skin is warm and dry.  Neurological:     Mental Status: She is alert and oriented to person, place, and time.     Coordination: Coordination normal.     Vitals:   06/16/18 1404  BP: 140/80  Pulse: 60  Temp: 97.9 F (36.6 C)  TempSrc: Oral  SpO2: 95%  Weight: 161 lb (73 kg)  Height: 5\' 4"  (1.626 m)   Flu shot given at visit  Assessment & Plan:  Visit time 40 minutes: greater than 50% of that time was spent in face to face counseling and coordination of care with the patient: counseled about the nature of osteoporosis and compression fracture and she had this on fosamax and the reasons to change to prolia, as well as possible options for pain in the back, possible pluses and minuses of back x-ray and PT.

## 2018-06-16 NOTE — Patient Instructions (Signed)
We will have you stop taking gabapentin and robaxin (methocarbamol). You do not have to taper off you can just stop taking them.    We will have you take tylenol 2 pills in the morning (500 mg each for total 1000 mg) and 2 pill at lunch time (500 mg each for total 1000 mg) and 1 pill tylenol with dinner (500 mg) and can take 1 pill tylenol pm with bedtime.   Call us if doing worse.   We will get physical therapy to the house to work with you.

## 2018-06-16 NOTE — Assessment & Plan Note (Signed)
Ordered x-ray lumbar. Referral for home health to help with safety and mobility. We will have her stop gabapentin and robaxin as they are likely impacting her stability and are not helping patient with pain. Will schedule tylenol 1000 mg TID for pain.

## 2018-06-21 ENCOUNTER — Other Ambulatory Visit: Payer: Self-pay | Admitting: Internal Medicine

## 2018-06-27 DIAGNOSIS — E785 Hyperlipidemia, unspecified: Secondary | ICD-10-CM | POA: Diagnosis not present

## 2018-06-27 DIAGNOSIS — M8580 Other specified disorders of bone density and structure, unspecified site: Secondary | ICD-10-CM | POA: Diagnosis not present

## 2018-06-27 DIAGNOSIS — Z9181 History of falling: Secondary | ICD-10-CM | POA: Diagnosis not present

## 2018-07-12 ENCOUNTER — Other Ambulatory Visit: Payer: Self-pay | Admitting: Internal Medicine

## 2018-07-13 DIAGNOSIS — M8580 Other specified disorders of bone density and structure, unspecified site: Secondary | ICD-10-CM | POA: Diagnosis not present

## 2018-07-13 DIAGNOSIS — E785 Hyperlipidemia, unspecified: Secondary | ICD-10-CM | POA: Diagnosis not present

## 2018-07-13 DIAGNOSIS — Z9181 History of falling: Secondary | ICD-10-CM | POA: Diagnosis not present

## 2018-07-16 ENCOUNTER — Other Ambulatory Visit: Payer: Self-pay | Admitting: Internal Medicine

## 2018-07-17 ENCOUNTER — Other Ambulatory Visit: Payer: Self-pay | Admitting: Internal Medicine

## 2018-07-17 MED ORDER — LEVOTHYROXINE SODIUM 50 MCG PO TABS: 50.0000 ug | ORAL_TABLET | Freq: Every day | ORAL | 0 refills | Status: DC

## 2018-07-17 NOTE — Telephone Encounter (Signed)
Control database checked last refill:05/27/2018 LOV: acute 06/16/2018, 02/20/2018 anxiety  NOV: none

## 2018-07-24 ENCOUNTER — Telehealth: Payer: Self-pay | Admitting: Internal Medicine

## 2018-07-24 NOTE — Telephone Encounter (Signed)
Insurance has been submitted and verified for Prolia. Patient is responsible for a $255 copay. Due anytime.  Okay to schedule... Visit Note: Prolia ($255 copay - okay to give per Gareth Eagle) Visit Type: Nurse Provider: Nurse   She would like to continue to take alendronate (FOSAMAX) 70 MG tablet at this time. She has one pill left. Can a refill of this be sent in for her.

## 2018-08-17 ENCOUNTER — Telehealth: Payer: Self-pay | Admitting: Internal Medicine

## 2018-08-17 NOTE — Telephone Encounter (Signed)
LVM with MD response  

## 2018-08-17 NOTE — Telephone Encounter (Signed)
Copied from [place] [id number]. Topic: Quick Communication - See Telephone Encounter >> Aug 17, 2018  1:52 PM Bea Graff, NT wrote: CRM for notification. See Telephone encounter for: 08/17/18. Pt states that she spoke with a nurse from the hospital on the phone yesterday and they stated she should not take the pravastatin due to her back pain. And she states she has stopped taking the medication. She would like to make sure this is ok.

## 2018-08-17 NOTE — Telephone Encounter (Signed)
Pravastatin should not affect her back pain.

## 2018-09-08 ENCOUNTER — Other Ambulatory Visit: Payer: Self-pay | Admitting: Internal Medicine

## 2018-09-29 ENCOUNTER — Telehealth: Payer: Self-pay | Admitting: Cardiology

## 2018-09-29 NOTE — Telephone Encounter (Signed)
No Smart phone/MyChart/pre reg completed. 09-29-18 ST Patient consents to phone visit. 09-29-18 ST

## 2018-10-01 NOTE — Progress Notes (Signed)
Virtual Visit via Video Note   This visit type was conducted due to national recommendations for restrictions regarding the COVID-19 Pandemic (e.g. social distancing) in an effort to limit this patient's exposure and mitigate transmission in our community.  Due to her co-morbid illnesses, this patient is at least at moderate risk for complications without adequate follow up.  This format is felt to be most appropriate for this patient at this time.  All issues noted in this document were discussed and addressed.  A limited physical exam was performed with this format.  Please refer to the patient's chart for her consent to telehealth for [facility].   Evaluation Performed:  Follow-up visit  Date:  10/02/2018   ID:  Deanna Olson, DOB 1926/02/24, MRN [id number]  Patient Location: Home Provider Location: Home  PCP:  Hoyt Koch, MD  Cardiologist:  Minus Breeding, MD  Electrophysiologist:  None   Chief Complaint:  Aortic Stenosis  History of Present Illness:    Deanna Olson is a 85 y.o. female with who presents for follow-up of aortic stenosis. His was mild on echo in 2016.   Since I last saw her she has done well.  The patient denies any new symptoms such as chest discomfort, neck or arm discomfort. There has been no new shortness of breath, PND or orthopnea. There have been no reported palpitations, presyncope or syncope.  She does her household chores which includes making the bed and cooking.  Her daughters do most of the housekeeping.  She was cooking for an 59 year old neighbor who has since moved to a nursing home.  The patient does not have symptoms concerning for COVID-19 infection (fever, chills, cough, or new shortness of breath).    Past Medical History:  Diagnosis Date  . Allergic rhinitis, cause unspecified   . Anxiety state, unspecified   . Aortic valve disorders    Mild AS 2013 echo   . Eczema   . Esophageal reflux   . Herpes zoster without mention of  complication   . Hyperlipidemia   . Hypertension   . Hypothyroid   . Osteoarthrosis, unspecified whether generalized or localized, unspecified site   . Unspecified venous (peripheral) insufficiency    Past Surgical History:  Procedure Laterality Date  . CATARACT EXTRACTION, BILATERAL  2013  . KYPHOPLASTY  02/2018  . VESICOVAGINAL FISTULA CLOSURE W/ TAH       Current Meds  Medication Sig  . acetaminophen (TYLENOL) 500 MG tablet Take 500 mg by mouth daily as needed for moderate pain.  Marland Kitchen alendronate (FOSAMAX) 70 MG tablet Take 1 tablet (70 mg total) by mouth every 7 (seven) days. Take with a full glass of water on an empty stomach.  . ALPRAZolam (XANAX) 0.5 MG tablet TAKE ONE TABLET BY MOUTH TWICE A DAY  . clobetasol (TEMOVATE) 0.05 % external solution Use as directed for scale itch  . ketoconazole (NIZORAL) 2 % shampoo USE AS DIRECTED  . levothyroxine (SYNTHROID, LEVOTHROID) 50 MCG tablet Take 1 tablet (50 mcg total) by mouth daily. Need annual appointment with labs for further refills  . lisinopril (PRINIVIL,ZESTRIL) 20 MG tablet TAKE ONE TABLET BY MOUTH DAILY  . omeprazole (PRILOSEC) 20 MG capsule Take 1 capsule (20 mg total) by mouth daily. Needs annual visit for further refills  . pravastatin (PRAVACHOL) 20 MG tablet Take 1 tablet (20 mg total) by mouth daily. Need annual visit for further refills  . vitamin C (ASCORBIC ACID) 500 MG tablet Take 500 mg by  mouth daily.  . Vitamin E 400 UNITS TABS Take 1 tablet by mouth daily.     Allergies:   Sulfonamide derivatives   Social History   Tobacco Use  . Smoking status: Never Smoker  . Smokeless tobacco: Never Used  Substance Use Topics  . Alcohol use: Never    Alcohol/week: 0.0 standard drinks    Frequency: Never  . Drug use: Never     Family Hx: The patient's family history includes CAD (age of onset: 64) in her brother; Cancer in her sister; Diabetes in her brother and mother; Heart attack (age of onset: 52) in her brother;  Stroke (age of onset: 17) in her mother.  ROS:   Please see the history of present illness.    As stated in the HPI and negative for all other systems.   Prior CV studies:   The following studies were reviewed today:  Labs  Labs/Other Tests and Data Reviewed:    EKG:  No ECG reviewed.  Recent Labs: No results found for requested labs within last 8760 hours.   Recent Lipid Panel Lab Results  Component Value Date/Time   CHOL 233 (H) 10/20/2016 08:53 AM   TRIG 176.0 (H) 10/20/2016 08:53 AM   HDL 47.40 10/20/2016 08:53 AM   CHOLHDL 5 10/20/2016 08:53 AM   LDLCALC 151 (H) 10/20/2016 08:53 AM   LDLDIRECT 197.0 08/26/2015 11:40 AM    Wt Readings from Last 3 Encounters:  10/02/18 155 lb (70.3 kg)  06/16/18 161 lb (73 kg)  02/20/18 158 lb (71.7 kg)     Objective:    Vital Signs:  BP 117/62   Pulse 65   Ht 5\' 4"  (1.626 m)   Wt 155 lb (70.3 kg)   BMI 26.61 kg/m    VITAL SIGNS:  reviewed GEN:  no acute distress EYES:  sclerae anicteric, EOMI - Extraocular Movements Intact NEURO:  alert and oriented x 3, no obvious focal deficit PSYCH:  normal affect  ASSESSMENT & PLAN:    LBBB: This is chronic.  No change in therapy.   HTN:  The blood pressure is at target.  Continue current therapy.   AORTIC STENOSIS:   We talked about this for quite awhile.  I did draw pictures and illustrated for her daughter.  In the absence of any progressive symptoms no further imaging is indicated.  We talked about shortness of breath, chest discomfort or dizziness or presyncope that could be symptoms of progressive disease.  They understand and no change in therapy is indicated.  I will see her again next year for evaluation.  COVID-19 Education: The signs and symptoms of COVID-19 were discussed with the patient and how to seek care for testing (follow up with PCP or arrange E-visit).  The importance of social distancing was discussed today.  Time:   Today, I have spent 25 minutes with the  patient with telehealth technology discussing the above problems.     Medication Adjustments/Labs and Tests Ordered: Current medicines are reviewed at length with the patient today.  Concerns regarding medicines are outlined above.   Tests Ordered: No orders of the defined types were placed in this encounter.   Medication Changes: No orders of the defined types were placed in this encounter.   Disposition:  Follow up 12 months  Signed, Minus Breeding, MD  10/02/2018 1:45 PM    [facility]

## 2018-10-02 ENCOUNTER — Encounter: Payer: Self-pay | Admitting: Cardiology

## 2018-10-02 ENCOUNTER — Telehealth (INDEPENDENT_AMBULATORY_CARE_PROVIDER_SITE_OTHER): Payer: PPO | Admitting: Cardiology

## 2018-10-02 VITALS — BP 117/62 | HR 65 | Ht 64.0 in | Wt 155.0 lb

## 2018-10-02 DIAGNOSIS — I350 Nonrheumatic aortic (valve) stenosis: Secondary | ICD-10-CM

## 2018-10-02 DIAGNOSIS — Z7189 Other specified counseling: Secondary | ICD-10-CM

## 2018-10-02 DIAGNOSIS — I10 Essential (primary) hypertension: Secondary | ICD-10-CM

## 2018-10-02 NOTE — Patient Instructions (Signed)

## 2018-10-12 ENCOUNTER — Other Ambulatory Visit: Payer: Self-pay | Admitting: Internal Medicine

## 2018-11-17 NOTE — Progress Notes (Signed)
Subjective:   Deanna Olson is a 85 y.o. female who presents for Medicare Annual (Subsequent) preventive examination. I connected with patient by a telephone and verified that I am speaking with the correct person using two identifiers. Patient stated full name and DOB. Patient gave permission to continue with telephonic visit. Patient's location was at home and Nurse's location was at [place] office.   Review of Systems:  No ROS.  Medicare Wellness Virtual Visit.  Visual/audio telehealth visit, UTA vital signs.   See social history for additional risk factors.  Cardiac Risk Factors include: advanced age (>83men, >17 women);dyslipidemia;hypertension Sleep patterns: feels rested on waking, gets up 2-3 times nightly to void and sleeps 7 hours nightly.    Home Safety/Smoke Alarms: Feels safe in home. Smoke alarms in place.  Living environment; residence and Firearm Safety: 1-story house/ trailer, equipment: Walkers, Type: Sales promotion account executive. Lives alone, no needs for DME, good support system Seat Belt Safety/Bike Helmet: Wears seat belt.     Objective:     Vitals: There were no vitals taken for this visit.  There is no height or weight on file to calculate BMI.  Advanced Directives 11/20/2018 11/14/2017 08/26/2015  Does Patient Have a Medical Advance Directive? Yes Yes Yes  Type of Paramedic of [place];Living will [place];Living will -  Copy of [place] in Chart? No - copy requested No - copy requested Yes    Tobacco Social History   Tobacco Use  Smoking Status Never Smoker  Smokeless Tobacco Never Used     Counseling given: Not Answered  Past Medical History:  Diagnosis Date  . Allergic rhinitis, cause unspecified   . Anxiety state, unspecified   . Aortic valve disorders    Mild AS 2013 echo   . Eczema   . Esophageal reflux   . Herpes zoster without mention of complication   . Hyperlipidemia   .  Hypertension   . Hypothyroid   . Osteoarthrosis, unspecified whether generalized or localized, unspecified site   . Unspecified venous (peripheral) insufficiency    Past Surgical History:  Procedure Laterality Date  . CATARACT EXTRACTION, BILATERAL  2013  . KYPHOPLASTY  02/2018  . VESICOVAGINAL FISTULA CLOSURE W/ TAH     Family History  Problem Relation Age of Onset  . Stroke Mother 45  . Diabetes Mother   . Diabetes Brother   . Heart attack Brother 80       s/p open heart surg  . CAD Brother 34  . Cancer Sister        4 types   Social History   Socioeconomic History  . Marital status: Widowed    Spouse name: (wilbur Comer)  . Number of children: 2  . Years of education: Not on file  . Highest education level: Not on file  Occupational History  . Occupation: retired  Scientific laboratory technician  . Financial resource strain: Not hard at all  . Food insecurity:    Worry: Never true    Inability: Never true  . Transportation needs:    Medical: No    Non-medical: No  Tobacco Use  . Smoking status: Never Smoker  . Smokeless tobacco: Never Used  Substance and Sexual Activity  . Alcohol use: Never    Alcohol/week: 0.0 standard drinks    Frequency: Never  . Drug use: Never  . Sexual activity: Never  Lifestyle  . Physical activity:    Days per week: 3  days    Minutes per session: 30 min  . Stress: Not at all  Relationships  . Social connections:    Talks on phone: More than three times a week    Gets together: More than three times a week    Attends religious service: More than 4 times per year    Active member of club or organization: Yes    Attends meetings of clubs or organizations: More than 4 times per year    Relationship status: Widowed  Other Topics Concern  . Not on file  Social History Narrative  . Not on file    Outpatient Encounter Medications as of 11/20/2018  Medication Sig  . acetaminophen (TYLENOL) 500 MG tablet Take 500 mg by mouth daily as needed for  moderate pain.  Marland Kitchen alendronate (FOSAMAX) 70 MG tablet Take 1 tablet (70 mg total) by mouth every 7 (seven) days. Take with a full glass of water on an empty stomach.  . ALPRAZolam (XANAX) 0.5 MG tablet TAKE ONE TABLET BY MOUTH TWICE A DAY  . antiseptic oral rinse (BIOTENE) LIQD 15 mLs by Mouth Rinse route as needed for dry mouth.  Marland Kitchen b complex vitamins capsule Take 1 capsule by mouth daily.  . Cholecalciferol (VITAMIN D3 PO) Take 1 tablet by mouth daily.  . clobetasol (TEMOVATE) 0.05 % external solution Use as directed for scale itch  . diclofenac sodium (VOLTAREN) 1 % GEL Apply 2 g topically as needed.  . diphenhydramine-acetaminophen (TYLENOL PM) 25-500 MG TABS tablet Take 1 tablet by mouth at bedtime as needed.  Marland Kitchen ketoconazole (NIZORAL) 2 % shampoo USE AS DIRECTED  . levothyroxine (SYNTHROID) 50 MCG tablet TAKE ONE TABLET BY MOUTH DAILY. *NEED ANNUAL APPOINTMENT WITH LABS FOR FURTHER REFILLS*  . lisinopril (PRINIVIL,ZESTRIL) 20 MG tablet TAKE ONE TABLET BY MOUTH DAILY  . Multiple Minerals-Vitamins (CALCIUM CITRATE-MAG-MINERALS PO) Take 1 tablet by mouth daily.  Marland Kitchen omeprazole (PRILOSEC) 20 MG capsule Take 1 capsule (20 mg total) by mouth daily. Needs annual visit for further refills  . Potassium Aminobenzoate 500 MG CAPS Take 1 capsule by mouth daily.  . pravastatin (PRAVACHOL) 20 MG tablet Take 1 tablet (20 mg total) by mouth daily. Need annual visit for further refills  . vitamin C (ASCORBIC ACID) 500 MG tablet Take 500 mg by mouth daily.  . Vitamin E 400 UNITS TABS Take 1 tablet by mouth daily.   No facility-administered encounter medications on file as of 11/20/2018.     Activities of Daily Living In your present state of health, do you have any difficulty performing the following activities: 11/20/2018  Hearing? N  Vision? N  Difficulty concentrating or making decisions? N  Walking or climbing stairs? N  Dressing or bathing? N  Doing errands, shopping? N  Preparing Food and eating ? N   Using the Toilet? N  In the past six months, have you accidently leaked urine? N  Do you have problems with loss of bowel control? N  Managing your Medications? N  Managing your Finances? N  Housekeeping or managing your Housekeeping? N  Some recent data might be hidden    Patient Care Team: Hoyt Koch, MD as PCP - General (Internal Medicine) Minus Breeding, MD as PCP - Cardiology (Cardiology) Druscilla Brownie, MD (Dermatology) Shon Hough, MD (Ophthalmology)    Assessment:   This is a routine wellness examination for [place]. Physical assessment deferred to PCP.  Exercise Activities and Dietary recommendations Current Exercise Habits: Home exercise routine, Type of  exercise: walking(PT exercises), Time (Minutes): 30, Frequency (Times/Week): 4, Weekly Exercise (Minutes/Week): 120, Intensity: Mild, Exercise limited by: orthopedic condition(s)  Diet (meal preparation, eat out, water intake, caffeinated beverages, dairy products, fruits and vegetables): in general, a "healthy" diet  , well balanced   Reviewed heart healthy and diabetic diet. Encouraged patient to increase daily water and healthy fluid intake.  Goals    . Patient Stated     Continue to be physically and socially active, enjoy life and family.        Fall Risk Fall Risk  11/20/2018 11/14/2017 03/10/2017 08/26/2015 04/30/2015  Falls in the past year? 0 No No Yes Yes  Number falls in past yr: 0 - - 2 or more 2 or more  Injury with Fall? - - - No No  Risk for fall due to : Impaired mobility - - - -  Follow up - - - Education provided -    Depression Screen PHQ 2/9 Scores 11/20/2018 11/14/2017 10/20/2016 08/26/2015  PHQ - 2 Score 1 0 0 0  PHQ- 9 Score - 0 0 -     Cognitive Function MMSE - Mini Mental State Exam 11/14/2017 08/26/2015  Not completed: - (No Data)  Orientation to time 5 -  Orientation to Place 5 -  Registration 3 -  Attention/ Calculation 5 -  Recall 3 -  Language- name 2 objects 2 -   Language- repeat 1 -  Language- follow 3 step command 3 -  Language- read & follow direction 1 -  Write a sentence 1 -  Copy design 1 -  Total score 30 -       Ad8 score reviewed for issues:  Issues making decisions: no  Less interest in hobbies / activities: no  Repeats questions, stories (family complaining): no  Trouble using ordinary gadgets (microwave, computer, phone):no  Forgets the month or year: no  Mismanaging finances: no  Remembering appts: no  Daily problems with thinking and/or memory: no Ad8 score is= 0  Immunization History  Administered Date(s) Administered  . Influenza Split 03/17/2011, 04/06/2012  . Influenza Whole 04/12/2005, 03/27/2008, 03/26/2009, 03/14/2010  . Influenza, High Dose Seasonal PF 03/18/2016, 03/10/2017, 06/16/2018  . Influenza,inj,Quad PF,6+ Mos 03/22/2013, 03/22/2014, 02/24/2015  . Pneumococcal Conjugate-13 02/24/2015  . Pneumococcal Polysaccharide-23 07/17/2013   Screening Tests Health Maintenance  Topic Date Due  . TETANUS/TDAP  02/21/2019 (Originally 02/04/1945)  . INFLUENZA VACCINE  01/13/2019  . DEXA SCAN  Completed  . PNA vac Low Risk Adult  Completed      Plan:    Reviewed health maintenance screenings with patient today and relevant education, vaccines, and/or referrals were provided.   Continue to eat heart healthy diet (full of fruits, vegetables, whole grains, lean protein, water--limit salt, fat, and sugar intake) and increase physical activity as tolerated.  Continue doing brain stimulating activities (puzzles, reading, adult coloring books, staying active) to keep memory sharp.   I have personally reviewed and noted the following in the patient's chart:   . Medical and social history . Use of alcohol, tobacco or illicit drugs  . Current medications and supplements . Functional ability and status . Nutritional status . Physical activity . Advanced directives . List of other physicians . Screenings to  include cognitive, depression, and falls . Referrals and appointments  In addition, I have reviewed and discussed with patient certain preventive protocols, quality metrics, and best practice recommendations. A written personalized care plan for preventive services as well as general preventive health  recommendations were provided to patient.     Michiel Cowboy, RN  11/20/2018

## 2018-11-20 ENCOUNTER — Ambulatory Visit (INDEPENDENT_AMBULATORY_CARE_PROVIDER_SITE_OTHER): Payer: PPO | Admitting: *Deleted

## 2018-11-20 DIAGNOSIS — Z0000 Encounter for general adult medical examination without abnormal findings: Secondary | ICD-10-CM | POA: Diagnosis not present

## 2018-11-20 NOTE — Patient Instructions (Signed)
If you cannot attend class in person, you can still exercise at home. Video taped versions of AHOY classes are shown on [employer] (GTN) at 8 am and 1 pm Mondays through Fridays. You can also purchase a copy of the AHOY DVD by calling [place] (GTN) [employer]. GTN is available on [employer] channel 13 with a digital cable box and on [employer] channel 31. GTN is also available on [employer], channel 99. To view GTN, go to channel 99, press OK, select [place], then select GTN to start the channel.

## 2018-11-20 NOTE — Progress Notes (Signed)
Medical screening examination/treatment/procedure(s) were performed by non-physician practitioner and as supervising physician I was immediately available for consultation/collaboration. I agree with above. Delmont Prosch A Jairy Angulo, MD 

## 2018-12-06 ENCOUNTER — Other Ambulatory Visit: Payer: Self-pay | Admitting: Internal Medicine

## 2018-12-11 DIAGNOSIS — H04123 Dry eye syndrome of bilateral lacrimal glands: Secondary | ICD-10-CM | POA: Diagnosis not present

## 2018-12-11 DIAGNOSIS — H43813 Vitreous degeneration, bilateral: Secondary | ICD-10-CM | POA: Diagnosis not present

## 2018-12-11 DIAGNOSIS — H353132 Nonexudative age-related macular degeneration, bilateral, intermediate dry stage: Secondary | ICD-10-CM | POA: Diagnosis not present

## 2019-01-06 ENCOUNTER — Other Ambulatory Visit: Payer: Self-pay | Admitting: Internal Medicine

## 2019-01-19 ENCOUNTER — Other Ambulatory Visit: Payer: Self-pay

## 2019-01-19 MED ORDER — ALENDRONATE SODIUM 70 MG PO TABS: 70.0000 mg | ORAL_TABLET | ORAL | 1 refills | Status: DC

## 2019-01-30 ENCOUNTER — Telehealth: Payer: Self-pay | Admitting: Internal Medicine

## 2019-01-30 NOTE — Telephone Encounter (Signed)
Copied from [place] [id number]. Topic: General - Other >> Jan 30, 2019  4:46 PM Keene Breath wrote: Reason for CRM: NP at [facility] called to inform the doctor that they did a PAD screen and the results were moderate deficit:  Left Leg had 0.72, Right Leg had 0.67.  She also said the report will be sent to the office in the next 2-4 weeks.  Any questions, please call at [phone]

## 2019-01-31 NOTE — Telephone Encounter (Signed)
FYI In Dr. Nathanial Millman absence

## 2019-01-31 NOTE — Telephone Encounter (Signed)
can wait for Dr Sharlet Salina

## 2019-02-02 ENCOUNTER — Telehealth: Payer: Self-pay | Admitting: Internal Medicine

## 2019-02-05 NOTE — Telephone Encounter (Signed)
Noted  

## 2019-02-05 NOTE — Telephone Encounter (Signed)
Given age I would likely not change anything with her management. She can have visit if she wants to discuss in detail.

## 2019-02-08 ENCOUNTER — Encounter: Payer: Self-pay | Admitting: Internal Medicine

## 2019-02-08 ENCOUNTER — Ambulatory Visit (INDEPENDENT_AMBULATORY_CARE_PROVIDER_SITE_OTHER)
Admission: RE | Admit: 2019-02-08 | Discharge: 2019-02-08 | Disposition: A | Discharge status: Home / Self Care | Payer: PPO | Source: Ambulatory Visit | Attending: Internal Medicine | Admitting: Internal Medicine

## 2019-02-08 ENCOUNTER — Other Ambulatory Visit: Payer: Self-pay

## 2019-02-08 ENCOUNTER — Other Ambulatory Visit (INDEPENDENT_AMBULATORY_CARE_PROVIDER_SITE_OTHER): Payer: PPO

## 2019-02-08 ENCOUNTER — Ambulatory Visit (INDEPENDENT_AMBULATORY_CARE_PROVIDER_SITE_OTHER): Payer: PPO | Admitting: Internal Medicine

## 2019-02-08 VITALS — BP 150/80 | HR 63 | Temp 98.1°F | Ht 64.0 in | Wt 160.0 lb

## 2019-02-08 DIAGNOSIS — M25562 Pain in left knee: Secondary | ICD-10-CM

## 2019-02-08 DIAGNOSIS — S8992XA Unspecified injury of left lower leg, initial encounter: Secondary | ICD-10-CM | POA: Diagnosis not present

## 2019-02-08 DIAGNOSIS — M545 Low back pain: Secondary | ICD-10-CM | POA: Diagnosis not present

## 2019-02-08 DIAGNOSIS — E559 Vitamin D deficiency, unspecified: Secondary | ICD-10-CM

## 2019-02-08 DIAGNOSIS — S3992XA Unspecified injury of lower back, initial encounter: Secondary | ICD-10-CM | POA: Diagnosis not present

## 2019-02-08 DIAGNOSIS — G8929 Other chronic pain: Secondary | ICD-10-CM | POA: Diagnosis not present

## 2019-02-08 DIAGNOSIS — M25561 Pain in right knee: Secondary | ICD-10-CM

## 2019-02-08 DIAGNOSIS — E039 Hypothyroidism, unspecified: Secondary | ICD-10-CM

## 2019-02-08 DIAGNOSIS — I10 Essential (primary) hypertension: Secondary | ICD-10-CM | POA: Diagnosis not present

## 2019-02-08 DIAGNOSIS — S8991XA Unspecified injury of right lower leg, initial encounter: Secondary | ICD-10-CM | POA: Diagnosis not present

## 2019-02-08 DIAGNOSIS — Z23 Encounter for immunization: Secondary | ICD-10-CM

## 2019-02-08 LAB — COMPREHENSIVE METABOLIC PANEL
ALT: 16 U/L (ref 0–35)
AST: 21 U/L (ref 0–37)
Albumin: 4.3 g/dL (ref 3.5–5.2)
Alkaline Phosphatase: 33 U/L — ABNORMAL LOW (ref 39–117)
BUN: 20 mg/dL (ref 6–23)
CO2: 25 mEq/L (ref 19–32)
Calcium: 9.7 mg/dL (ref 8.4–10.5)
Chloride: 105 mEq/L (ref 96–112)
Creatinine, Ser: 1.04 mg/dL (ref 0.40–1.20)
GFR: 49.67 mL/min — ABNORMAL LOW (ref >60.00)
Glucose, Bld: 105 mg/dL — ABNORMAL HIGH (ref 70–99)
Potassium: 5.1 mEq/L (ref 3.5–5.1)
Sodium: 139 mEq/L (ref 135–145)
Total Bilirubin: 0.4 mg/dL (ref 0.2–1.2)
Total Protein: 7.4 g/dL (ref 6.0–8.3)

## 2019-02-08 LAB — VITAMIN D 25 HYDROXY (VIT D DEFICIENCY, FRACTURES): VITD: 30.01 ng/mL (ref 30.00–100.00)

## 2019-02-08 LAB — LIPID PANEL
Cholesterol: 232 mg/dL — ABNORMAL HIGH (ref 0–200)
HDL: 45.5 mg/dL (ref >39.00)
NonHDL: 186.08
Total CHOL/HDL Ratio: 5
Triglycerides: 368 mg/dL — ABNORMAL HIGH (ref 0.0–149.0)
VLDL: 73.6 mg/dL — ABNORMAL HIGH (ref 0.0–40.0)

## 2019-02-08 LAB — T4, FREE: Free T4: 0.76 ng/dL (ref 0.60–1.60)

## 2019-02-08 LAB — CBC
HCT: 38.2 % (ref 36.0–46.0)
Hemoglobin: 12.6 g/dL (ref 12.0–15.0)
MCHC: 33.1 g/dL (ref 30.0–36.0)
MCV: 95.7 fl (ref 78.0–100.0)
Platelets: 206 10*3/uL (ref 150.0–400.0)
RBC: 3.99 Mil/uL (ref 3.87–5.11)
RDW: 15.4 % (ref 11.5–15.5)
WBC: 7.5 10*3/uL (ref 4.0–10.5)

## 2019-02-08 LAB — TSH: TSH: 4.03 u[IU]/mL (ref 0.35–4.50)

## 2019-02-08 LAB — LDL CHOLESTEROL, DIRECT: Direct LDL: 153 mg/dL

## 2019-02-08 NOTE — Patient Instructions (Signed)
We will check x-ray of the knees and the back today.   We have sent in a cream to use on the ears twice a day.

## 2019-02-08 NOTE — Progress Notes (Signed)
   Subjective:   Patient ID: Deanna Olson, female    DOB: 1926-04-17, 85 y.o.   MRN: [id number]  HPI The patient is a 85 YO female coming in for right knee pain (fell and caught herself partially, hit her knees, using biofreeze which does help temporarily, pain is mostly right below the knee which is where she fell, walking okay but slow and mostly with walker) and left knee pain (s/p fall and hit both knees, using biofreeze which helps temporarily, pain 3/10, uses walker most of the time, can use cane for walking also) as well as rash around her ears (started after a perm given by her daughter a week or so ago, is itching and mild burning, she is not scratching, using some old ketoconazole cream on it and has not noticed a difference).   Review of Systems  Constitutional: Positive for activity change.  HENT: Negative.   Eyes: Negative.   Respiratory: Negative for cough, chest tightness and shortness of breath.   Cardiovascular: Negative for chest pain, palpitations and leg swelling.  Gastrointestinal: Negative for abdominal distention, abdominal pain, constipation, diarrhea, nausea and vomiting.  Musculoskeletal: Positive for arthralgias, back pain and myalgias.  Skin: Positive for color change and rash.  Neurological: Negative.   Psychiatric/Behavioral: Negative.     Objective:  Physical Exam Constitutional:      Appearance: She is well-developed.  HENT:     Head: Normocephalic and atraumatic.  Neck:     Musculoskeletal: Normal range of motion.  Cardiovascular:     Rate and Rhythm: Normal rate and regular rhythm.  Pulmonary:     Effort: Pulmonary effort is normal. No respiratory distress.     Breath sounds: Normal breath sounds. No wheezing or rales.  Abdominal:     General: Bowel sounds are normal. There is no distension.     Palpations: Abdomen is soft.     Tenderness: There is no abdominal tenderness. There is no rebound.  Musculoskeletal:        General: Tenderness present.      Comments: Pain at the superior aspect tibia right more than left, knees without pain to patella or LCL/MCL, ACL/PCL intact bilaterally, no knee effusion bilaterally.   Skin:    General: Skin is warm and dry.     Findings: Rash present.  Neurological:     Mental Status: She is alert and oriented to person, place, and time.     Coordination: Coordination abnormal.     Comments: Cane for ambulation, slow to stand but steady gait once walking  Psychiatric:        Mood and Affect: Mood normal.     Vitals:   02/08/19 1254  BP: (!) 150/80  Pulse: 63  Temp: 98.1 F (36.7 C)  TempSrc: Oral  SpO2: 99%  Weight: 160 lb (72.6 kg)  Height: 5\' 4"  (1.626 m)   Visit time 40 minutes: greater than 50% of that time was spent in face to face counseling and coordination of care with the patient: counseled about right/left knee pain and possible etiologies, as well as need for labs for her chronic conditions and assessment for if the fall was medication induced  Assessment & Plan:  Flu shot given at visit

## 2019-02-09 DIAGNOSIS — M25562 Pain in left knee: Secondary | ICD-10-CM | POA: Insufficient documentation

## 2019-02-09 MED ORDER — TRIAMCINOLONE ACETONIDE 0.1 % EX CREA: 1.0000 "application " | TOPICAL_CREAM | Freq: Two times a day (BID) | CUTANEOUS | 1 refills | Status: DC

## 2019-02-09 NOTE — Assessment & Plan Note (Signed)
Checking x-ray given pain and fall with osteoporosis. Adjust as needed. No signs of ligament tear on exam.

## 2019-02-09 NOTE — Assessment & Plan Note (Signed)
Checking x-ray with fall, she is not having increase in pain but chronic pain. Has osteoporosis and prior compression fracture.

## 2019-02-09 NOTE — Assessment & Plan Note (Signed)
Given osteoporosis needs x-ray with fall and tenderness to tibia.

## 2019-02-09 NOTE — Assessment & Plan Note (Signed)
Checking vitamin D level and adjust as needed.  

## 2019-02-09 NOTE — Assessment & Plan Note (Signed)
Needs TSH and free T4 as no labs in some time. Adjust synthroid 50 mcg daily as needed.

## 2019-02-09 NOTE — Assessment & Plan Note (Signed)
Checking CMP and adjust lisinopril as needed. BP up today due to pain, okay at home.

## 2019-03-03 ENCOUNTER — Other Ambulatory Visit: Payer: Self-pay | Admitting: Internal Medicine

## 2019-03-07 ENCOUNTER — Telehealth: Payer: Self-pay | Admitting: Internal Medicine

## 2019-03-07 NOTE — Telephone Encounter (Signed)
Pt called in to request for refill for ALPRAZolam [name]) 0.5 MG tablet   Pharmacy: [facility] [address] [place]

## 2019-03-08 ENCOUNTER — Other Ambulatory Visit: Payer: Self-pay | Admitting: Internal Medicine

## 2019-03-12 NOTE — Telephone Encounter (Signed)
[place] Controlled Database Checked Last filled: 01/11/19 # 60 LOV w/PCP: 02/08/19 Next appt: None  Please advise in PCP's absence. Thanks!

## 2019-03-20 ENCOUNTER — Other Ambulatory Visit: Payer: Self-pay | Admitting: Internal Medicine

## 2019-05-11 ENCOUNTER — Other Ambulatory Visit: Payer: Self-pay | Admitting: Family

## 2019-05-11 ENCOUNTER — Telehealth: Payer: Self-pay | Admitting: Internal Medicine

## 2019-05-11 NOTE — Telephone Encounter (Signed)
Copied from [place] #[id number]. Topic: Quick Communication - Rx Refill/Question >> May 11, 2019  3:44 PM Leward Quan A wrote: Medication: ALPRAZolam [name]) 0.5 MG tablet   Has the patient contacted their pharmacy? Yes.   (Agent: If no, request that the patient contact the pharmacy for the refill.) (Agent: If yes, when and what did the pharmacy advise?)  Preferred Pharmacy (with phone number or street name): [facility] [address] [place] - [address] [phone] (Phone) [phone] (Fax)    Agent: Please be advised that RX refills may take up to 3 business days. We ask that you follow-up with your pharmacy.

## 2019-05-14 MED ORDER — ALPRAZOLAM 0.5 MG PO TABS: 0.5000 mg | ORAL_TABLET | Freq: Two times a day (BID) | ORAL | 0 refills | Status: DC

## 2019-05-14 NOTE — Telephone Encounter (Signed)
Control database checked last refill:03/13/2019 60 tabs LOV:02/08/2019 [id number]

## 2019-05-18 ENCOUNTER — Other Ambulatory Visit: Payer: Self-pay

## 2019-05-18 ENCOUNTER — Encounter: Payer: Self-pay | Admitting: Family

## 2019-05-18 ENCOUNTER — Ambulatory Visit (INDEPENDENT_AMBULATORY_CARE_PROVIDER_SITE_OTHER): Payer: PPO | Admitting: Family

## 2019-05-18 ENCOUNTER — Other Ambulatory Visit (INDEPENDENT_AMBULATORY_CARE_PROVIDER_SITE_OTHER): Payer: PPO

## 2019-05-18 VITALS — BP 142/82 | HR 64 | Temp 97.7°F | Ht 64.0 in | Wt 161.0 lb

## 2019-05-18 DIAGNOSIS — N390 Urinary tract infection, site not specified: Secondary | ICD-10-CM | POA: Diagnosis not present

## 2019-05-18 DIAGNOSIS — L304 Erythema intertrigo: Secondary | ICD-10-CM | POA: Diagnosis not present

## 2019-05-18 DIAGNOSIS — R7309 Other abnormal glucose: Secondary | ICD-10-CM

## 2019-05-18 LAB — BASIC METABOLIC PANEL
BUN: 19 mg/dL (ref 6–23)
CO2: 23 mEq/L (ref 19–32)
Calcium: 9.4 mg/dL (ref 8.4–10.5)
Chloride: 102 mEq/L (ref 96–112)
Creatinine, Ser: 1.02 mg/dL (ref 0.40–1.20)
GFR: 50.76 mL/min — ABNORMAL LOW (ref >60.00)
Glucose, Bld: 88 mg/dL (ref 70–99)
Potassium: 4.7 mEq/L (ref 3.5–5.1)
Sodium: 136 mEq/L (ref 135–145)

## 2019-05-18 LAB — HEMOGLOBIN A1C: Hgb A1c MFr Bld: 5.2 % (ref 4.6–6.5)

## 2019-05-18 MED ORDER — NYSTATIN-TRIAMCINOLONE 100000-0.1 UNIT/GM-% EX CREA: 1.0000 "application " | TOPICAL_CREAM | Freq: Two times a day (BID) | CUTANEOUS | 1 refills | Status: DC

## 2019-05-18 MED ORDER — FLUCONAZOLE 100 MG PO TABS: 100.0000 mg | ORAL_TABLET | Freq: Every day | ORAL | 0 refills | Status: DC

## 2019-05-18 MED ORDER — CEPHALEXIN 500 MG PO CAPS: 500.0000 mg | ORAL_CAPSULE | Freq: Three times a day (TID) | ORAL | 0 refills | Status: DC

## 2019-05-18 NOTE — Patient Instructions (Signed)
Intertrigo Intertrigo is skin irritation (inflammation) that happens in warm, moist areas of the body. The irritation can cause a rash and make skin raw and itchy. The rash is usually pink or red. It happens mostly between folds of skin or where skin rubs together, such as:  Between the toes.  In the armpits.  In the groin area.  Under the belly.  Under the breasts.  Around the butt area. This condition is not passed from person to person (is not contagious). What are the causes?  Heat, moisture, rubbing, and not enough air movement.  The condition can be made worse by: ? Sweat. ? Bacteria. ? A fungus, such as yeast. What increases the risk?  Moisture in your skin folds.  You are more likely to develop this condition if you: ? Have diabetes. ? Are overweight. ? Are not able to move around. ? Live in a warm and moist climate. ? Wear splints, braces, or other medical devices. ? Are not able to control your pee (urine) or poop (stool). What are the signs or symptoms?  A pink or red skin rash in the skin fold or near the skin fold.  Raw or scaly skin.  Itching.  A burning feeling.  Bleeding.  Leaking fluid.  A bad smell. How is this treated?  Cleaning and drying your skin.  Taking an antibiotic medicine or using an antibiotic skin cream for a bacterial infection.  Using an antifungal cream on your skin or taking pills for an infection that was caused by a fungus, such as yeast.  Using a steroid ointment to stop the itching and irritation.  Separating the skin fold with a clean cotton cloth to absorb moisture and allow air to flow into the area. Follow these instructions at home:  Keep the affected area clean and dry.  Do not scratch your skin.  Stay cool as much as you can. Use an air conditioner or a fan, if you have one.  Apply over-the-counter and prescription medicines only as told by your doctor.  If you were prescribed an antibiotic medicine,  use it as told by your doctor. Do not stop using the antibiotic even if your condition starts to get better.  Keep all follow-up visits as told by your doctor. This is important. How is this prevented?   Stay at a healthy weight.  Take care of your feet. This is very important if you have diabetes. You should: ? Wear shoes that fit well. ? Keep your feet dry. ? Wear clean cotton or wool socks.  Protect the skin in your groin and butt area as told by your doctor. To do this: ? Follow a regular cleaning routine. ? Use creams, powders, or ointments that protect your skin. ? Change protection pads often.  Do not wear tight clothes. Wear clothes that: ? Are loose. ? Take moisture away from your body. ? Are made of cotton.  Wear a bra that gives good support, if needed.  Shower and dry yourself well after being active. Use a hair dryer on a cool setting to dry between skin folds.  Keep your blood sugar under control if you have diabetes. Contact a doctor if:  Your symptoms do not get better with treatment.  Your symptoms get worse or they spread.  You notice more redness and warmth.  You have a fever. Summary  Intertrigo is skin irritation that occurs when folds of skin rub together.  This condition is caused by heat, moisture,   and rubbing.  This condition may be treated by cleaning and drying your skin and with medicines.  Apply over-the-counter and prescription medicines only as told by your doctor.  Keep all follow-up visits as told by your doctor. This is important. This information is not intended to replace advice given to you by your health care provider. Make sure you discuss any questions you have with your health care provider. Document Released: 07/03/2010 Document Revised: 03/09/2018 Document Reviewed: 03/09/2018 Elsevier Patient Education  2020 Elsevier Inc.  

## 2019-05-18 NOTE — Progress Notes (Signed)
Deanna Olson is a 85 y.o. female with the following history as recorded in EpicCare:  Patient Active Problem List   Diagnosis Date Noted  . Acute pain of left knee 02/09/2019  . Compression fracture of L3 lumbar vertebra, sequela 06/16/2018  . Osteoporosis 02/20/2018  . Cough 02/20/2018  . Rash 01/27/2017  . Low back pain 07/06/2016  . Right knee pain 07/06/2016  . CKD (chronic kidney disease) stage 3, GFR 30-59 ml/min 08/13/2014  . Routine general medical examination at a health care facility 08/13/2014  . Vitamin D deficiency 12/02/2009  . AORTIC VALVE DISEASE 01/24/2008  . DEGENERATIVE JOINT DISEASE 11/05/2007  . Hypothyroidism 10/31/2007  . Essential hypertension 10/31/2007  . Allergic rhinitis 10/31/2007  . Hyperlipidemia 04/12/2007  . Anxiety state 04/12/2007  . GERD 04/12/2007    Current Outpatient Medications  Medication Sig Dispense Refill  . acetaminophen (TYLENOL) 500 MG tablet Take 500 mg by mouth daily as needed for moderate pain.    Marland Kitchen alendronate (FOSAMAX) 70 MG tablet Take 1 tablet (70 mg total) by mouth every 7 (seven) days. Take with a full glass of water on an empty stomach. 12 tablet 1  . ALPRAZolam (XANAX) 0.5 MG tablet Take 1 tablet (0.5 mg total) by mouth 2 (two) times daily. 60 tablet 0  . antiseptic oral rinse (BIOTENE) LIQD 15 mLs by Mouth Rinse route as needed for dry mouth.    Marland Kitchen b complex vitamins capsule Take 1 capsule by mouth daily.    . Cholecalciferol (VITAMIN D3 PO) Take 1 tablet by mouth daily.    . clobetasol (TEMOVATE) 0.05 % external solution Use as directed for scale itch    . diclofenac sodium (VOLTAREN) 1 % GEL Apply 2 g topically as needed.    . diphenhydramine-acetaminophen (TYLENOL PM) 25-500 MG TABS tablet Take 1 tablet by mouth at bedtime as needed.    Marland Kitchen ketoconazole (NIZORAL) 2 % shampoo USE AS DIRECTED 120 mL 0  . levothyroxine (SYNTHROID) 50 MCG tablet Take 1 tablet (50 mcg total) by mouth daily before breakfast. 90 tablet 3  .  lisinopril (ZESTRIL) 20 MG tablet TAKE ONE TABLET BY MOUTH DAILY 90 tablet 1  . Multiple Minerals-Vitamins (CALCIUM CITRATE-MAG-MINERALS PO) Take 1 tablet by mouth daily.    Marland Kitchen omeprazole (PRILOSEC) 20 MG capsule Take 1 capsule (20 mg total) by mouth daily. Needs annual visit for further refills 90 capsule 3  . Potassium Aminobenzoate 500 MG CAPS Take 1 capsule by mouth daily.    . pravastatin (PRAVACHOL) 20 MG tablet Take 1 tablet (20 mg total) by mouth daily. Need annual visit for further refills 90 tablet 3  . triamcinolone cream (KENALOG) 0.1 % Apply 1 application topically 2 (two) times daily. 100 g 1  . vitamin C (ASCORBIC ACID) 500 MG tablet Take 500 mg by mouth daily.    . Vitamin E 400 UNITS TABS Take 1 tablet by mouth daily.    . cephALEXin (KEFLEX) 500 MG capsule Take 1 capsule (500 mg total) by mouth 3 (three) times daily. 21 capsule 0  . fluconazole (DIFLUCAN) 100 MG tablet Take 1 tablet (100 mg total) by mouth daily. 10 tablet 0  . nystatin-triamcinolone (MYCOLOG II) cream Apply 1 application topically 2 (two) times daily. 60 g 1   No current facility-administered medications for this visit.     Allergies: Sulfonamide derivatives  Past Medical History:  Diagnosis Date  . Allergic rhinitis, cause unspecified   . Anxiety state, unspecified   . Aortic  valve disorders    Mild AS 2013 echo   . Eczema   . Esophageal reflux   . Herpes zoster without mention of complication   . Hyperlipidemia   . Hypertension   . Hypothyroid   . Osteoarthrosis, unspecified whether generalized or localized, unspecified site   . Unspecified venous (peripheral) insufficiency     Past Surgical History:  Procedure Laterality Date  . CATARACT EXTRACTION, BILATERAL  2013  . KYPHOPLASTY  02/2018  . VESICOVAGINAL FISTULA CLOSURE W/ TAH      Family History  Problem Relation Age of Onset  . Stroke Mother 26  . Diabetes Mother   . Diabetes Brother   . Heart attack Brother 78       s/p open heart  surg  . CAD Brother 8  . Cancer Sister        4 types    Social History   Tobacco Use  . Smoking status: Never Smoker  . Smokeless tobacco: Never Used  Substance Use Topics  . Alcohol use: Never    Alcohol/week: 0.0 standard drinks    Frequency: Never    Subjective:  Accompanied by her daughter today; complaining of painful rash beneath both breasts and in her groin area  X 1 week; denies any new soaps, foods, detergents or medications.  Has been using baby powder and corn starch with no relief; no fever;  Is not diabetic;   Objective:  Vitals:   05/18/19 1107  BP: (!) 142/82  Pulse: 64  Temp: 97.7 F (36.5 C)  TempSrc: Oral  SpO2: 99%  Weight: 161 lb 0.6 oz (73 kg)  Height: 5\' 4"  (1.626 m)    General: Well developed, well nourished, in no acute distress  Skin : Warm and dry. Marked areas of erythema beneath both breasts and in groin area with secondary pustular lesions noted Head: Normocephalic and atraumatic  Lungs: Respirations unlabored; clear to auscultation bilaterally without wheeze, rales, rhonchi  Neurologic: Alert and oriented; speech intact; face symmetrical; moves all extremities well; CNII-XII intact without focal deficit   Assessment:  1. Intertrigo   2. Elevated glucose   3. Urinary tract infection without hematuria, site unspecified     Plan:  1. Rx for Diflucan, Keflex and Mycolog II; discussed importance of keeping area cool and dry; follow-up if area persists; 2. Check BMP, hgba1c today; 3. Will check urine culture per patient request as she has been getting up more frequently at night;   This visit occurred during the SARS-CoV-2 public health emergency.  Safety protocols were in place, including screening questions prior to the visit, additional usage of staff PPE, and extensive cleaning of exam room while observing appropriate contact time as indicated for disinfecting solutions.   No follow-ups on file.  Orders Placed This Encounter   Procedures  . Urine Culture    Standing Status:   Future    Number of Occurrences:   1    Standing Expiration Date:   05/17/2020  . Basic Metabolic Panel (BMET)    Standing Status:   Future    Number of Occurrences:   1    Standing Expiration Date:   05/17/2020  . HgB A1c    Standing Status:   Future    Number of Occurrences:   1    Standing Expiration Date:   05/17/2020    Requested Prescriptions   Signed Prescriptions Disp Refills  . fluconazole (DIFLUCAN) 100 MG tablet 10 tablet 0    Sig:  Take 1 tablet (100 mg total) by mouth daily.  . cephALEXin (KEFLEX) 500 MG capsule 21 capsule 0    Sig: Take 1 capsule (500 mg total) by mouth 3 (three) times daily.  Marland Kitchen nystatin-triamcinolone (MYCOLOG II) cream 60 g 1    Sig: Apply 1 application topically 2 (two) times daily.

## 2019-05-19 LAB — URINE CULTURE
MICRO NUMBER:: 1164847
SPECIMEN QUALITY:: ADEQUATE

## 2019-06-01 ENCOUNTER — Other Ambulatory Visit: Payer: Self-pay | Admitting: Internal Medicine

## 2019-06-01 MED ORDER — TRIAMCINOLONE ACETONIDE 0.1 % EX CREA: 1.0000 "application " | TOPICAL_CREAM | Freq: Two times a day (BID) | CUTANEOUS | 1 refills | Status: DC

## 2019-06-01 NOTE — Telephone Encounter (Signed)
Copied from [place] [id number]. Topic: Quick Communication - Rx Refill/Question >> Jun 01, 2019  2:09 PM Leward Quan A wrote: Medication: triamcinolone cream (KENALOG) 0.1 %, nystatin-triamcinolone (MYCOLOG II) cream  Has the patient contacted their pharmacy? Yes.   (Agent: If no, request that the patient contact the pharmacy for the refill.) (Agent: If yes, when and what did the pharmacy advise?)  Preferred Pharmacy (with phone number or street name): [facility] [address] [place]  Phone:  [phone] Fax:  [phone]     Agent: Please be advised that RX refills may take up to 3 business days. We ask that you follow-up with your pharmacy.

## 2019-06-01 NOTE — Telephone Encounter (Signed)
Requested medication (s) are due for refill today: yes  Requested medication (s) are on the active medication list: yes  Last refill:  05/18/2019  Future visit scheduled: no  Notes to clinic:  Medication not assigned to a protocol, review manually   Requested Prescriptions  Pending Prescriptions Disp Refills   nystatin-triamcinolone (MYCOLOG II) cream 60 g 1    Sig: Apply 1 application topically 2 (two) times daily.      Off-Protocol Failed - 06/01/2019  2:14 PM      Failed - Medication not assigned to a protocol, review manually.      Passed - Valid encounter within last 12 months    Recent Outpatient Visits           2 weeks ago [employer] [place], Marvis Repress, [place]   3 months ago Acute pain of left knee   [place], [place], [place]   11 months ago Compression fracture of L3 lumbar vertebra, sequela   [place], Elizabeth A, MD   1 year ago Age-related osteoporosis with current pathological fracture, initial encounter   [place], Elizabeth A, MD   1 year ago Acute bilateral low back pain without sciatica   [place], Elizabeth A, MD               Signed Prescriptions Disp Refills   triamcinolone cream (KENALOG) 0.1 % 100 g 1    Sig: Apply 1 application topically 2 (two) times daily.      Dermatology:  Corticosteroids Passed - 06/01/2019  2:14 PM      Passed - Valid encounter within last 12 months    Recent Outpatient Visits           2 weeks ago [place], Marvis Repress, [place]   3 months ago Acute pain of left knee   [place], [place], MD   11 months ago Compression fracture of L3 lumbar vertebra, sequela   [place], Elizabeth A, MD   1 year ago  Age-related osteoporosis with current pathological fracture, initial encounter   [facility] -Chuck Hint, MD   1 year ago Acute bilateral low back pain without sciatica   [facility] -Chuck Hint, MD

## 2019-06-09 ENCOUNTER — Other Ambulatory Visit: Payer: Self-pay | Admitting: Internal Medicine

## 2019-07-05 ENCOUNTER — Telehealth: Payer: Self-pay | Admitting: Internal Medicine

## 2019-07-05 NOTE — Telephone Encounter (Signed)
Patient states that she seen Jodi Mourning back in December for chaffing in her groin and under her breast.  States that she was prescribed triamcinolone cream and nystatin.  States she is now out of this medication.  States the areas are getting better but still are a little pink.  States it takes a lot of cream to cover her areas.  Would like to know if Dr. Sharlet Salina could send meds to her pharmacy today?  [facility] [address].  States her daughter is going to pick meds up today for her and patient does not want to leave her house until she gets her [place] in Feb.

## 2019-07-06 MED ORDER — TRIAMCINOLONE ACETONIDE 0.1 % EX CREA: 1.0000 "application " | TOPICAL_CREAM | Freq: Two times a day (BID) | CUTANEOUS | 1 refills | Status: DC

## 2019-07-06 MED ORDER — NYSTATIN-TRIAMCINOLONE 100000-0.1 UNIT/GM-% EX CREA: 1.0000 "application " | TOPICAL_CREAM | Freq: Two times a day (BID) | CUTANEOUS | 1 refills | Status: DC

## 2019-07-08 ENCOUNTER — Other Ambulatory Visit: Payer: Self-pay | Admitting: Internal Medicine

## 2019-07-12 ENCOUNTER — Other Ambulatory Visit: Payer: Self-pay | Admitting: Internal Medicine

## 2019-07-12 ENCOUNTER — Ambulatory Visit: Payer: PPO

## 2019-07-13 NOTE — Telephone Encounter (Signed)
Check [place] registry last filled 08/02/2017.Marland KitchenJohny Olson

## 2019-07-17 ENCOUNTER — Ambulatory Visit: Payer: PPO

## 2019-07-21 ENCOUNTER — Ambulatory Visit: Payer: PPO | Attending: Internal Medicine

## 2019-07-21 DIAGNOSIS — Z23 Encounter for immunization: Secondary | ICD-10-CM | POA: Insufficient documentation

## 2019-07-21 NOTE — Progress Notes (Signed)
   Covid-19 Vaccination Clinic  Name:  Deanna Olson    MRN: [id number] DOB: 03/06/1926  07/21/2019  Deanna Olson was observed post Covid-19 immunization for 15 minutes without incidence. She was provided with Vaccine Information Sheet and instruction to access the V-Safe system.   Deanna Olson was instructed to call 911 with any severe reactions post vaccine: Marland Kitchen Difficulty breathing  . Swelling of your face and throat  . A fast heartbeat  . A bad rash all over your body  . Dizziness and weakness    Immunizations Administered    Name Date Dose VIS Date Route   Pfizer COVID-19 Vaccine 07/21/2019  1:38 PM 0.3 mL 05/25/2019 Intramuscular   Manufacturer: [place]   Lot: [id number]   [place]: [id number]

## 2019-08-15 ENCOUNTER — Ambulatory Visit: Payer: PPO | Attending: Internal Medicine

## 2019-08-15 DIAGNOSIS — Z23 Encounter for immunization: Secondary | ICD-10-CM | POA: Insufficient documentation

## 2019-08-15 NOTE — Progress Notes (Signed)
   Covid-19 Vaccination Clinic  Name:  Deanna Olson    MRN: [id number] DOB: [birth year]  08/15/2019  [name] was observed post Covid-19 immunization for 15 minutes without incident. She was provided with Vaccine Information Sheet and instruction to access the V-Safe system.   [name] was instructed to call 911 with any severe reactions post vaccine: Marland Kitchen Difficulty breathing  . Swelling of face and throat  . A fast heartbeat  . A bad rash all over body  . Dizziness and weakness   Immunizations Administered    Name Date Dose VIS Date Route   Pfizer COVID-19 Vaccine 08/15/2019  1:32 PM 0.3 mL 05/25/2019 Intramuscular   Manufacturer: [place]   Lot: [id number]   [place]: [id number]

## 2019-08-28 ENCOUNTER — Other Ambulatory Visit: Payer: Self-pay | Admitting: Internal Medicine

## 2019-08-28 NOTE — Telephone Encounter (Signed)
    1.Medication Requested:ketoconazole (NIZORAL) 2 % shampoo  2. Pharmacy (Name, Street, [place]): [facility] [place], [place] [address]  3. On Med List: YES  4. Last Visit with PCP: 05/18/19  5. Next visit date with PCP: N/A   Agent: Please be advised that RX refills may take up to 3 business days. We ask that you follow-up with your pharmacy.

## 2019-08-30 MED ORDER — KETOCONAZOLE 2 % EX SHAM: MEDICATED_SHAMPOO | CUTANEOUS | 0 refills | Status: DC

## 2019-08-30 NOTE — Telephone Encounter (Signed)
° °  Pharmacy requesting more specific directions on ketoconazole (NIZORAL) 2 % shampoo. Insurance requires frequency of use  Please call pharmacy to update

## 2019-08-30 NOTE — Telephone Encounter (Signed)
Reviewed chart pt is up-to-date sent refills to [facility].../lmb  

## 2019-08-31 MED ORDER — KETOCONAZOLE 2 % EX SHAM: MEDICATED_SHAMPOO | CUTANEOUS | 0 refills | Status: DC

## 2019-08-31 NOTE — Addendum Note (Signed)
Addended by: Karle Barr on: 08/31/2019 02:22 PM   Modules accepted: Orders

## 2019-08-31 NOTE — Telephone Encounter (Signed)
rx has been resent with updated sig per PCP.

## 2019-08-31 NOTE — Telephone Encounter (Signed)
2 times per week

## 2019-09-10 ENCOUNTER — Other Ambulatory Visit: Payer: Self-pay | Admitting: Internal Medicine

## 2019-09-24 DIAGNOSIS — Z961 Presence of intraocular lens: Secondary | ICD-10-CM | POA: Diagnosis not present

## 2019-09-24 DIAGNOSIS — H43813 Vitreous degeneration, bilateral: Secondary | ICD-10-CM | POA: Diagnosis not present

## 2019-09-24 DIAGNOSIS — H5213 Myopia, bilateral: Secondary | ICD-10-CM | POA: Diagnosis not present

## 2019-09-24 DIAGNOSIS — H353132 Nonexudative age-related macular degeneration, bilateral, intermediate dry stage: Secondary | ICD-10-CM | POA: Diagnosis not present

## 2019-11-16 ENCOUNTER — Ambulatory Visit (INDEPENDENT_AMBULATORY_CARE_PROVIDER_SITE_OTHER): Payer: PPO | Admitting: Internal Medicine

## 2019-11-16 ENCOUNTER — Other Ambulatory Visit: Payer: Self-pay

## 2019-11-16 ENCOUNTER — Telehealth: Payer: Self-pay

## 2019-11-16 ENCOUNTER — Encounter: Payer: Self-pay | Admitting: Internal Medicine

## 2019-11-16 DIAGNOSIS — R21 Rash and other nonspecific skin eruption: Secondary | ICD-10-CM

## 2019-11-16 MED ORDER — CLOBETASOL PROPIONATE 0.05 % EX OINT: 1.0000 "application " | TOPICAL_OINTMENT | Freq: Two times a day (BID) | CUTANEOUS | 11 refills | Status: DC

## 2019-11-16 MED ORDER — FLUCONAZOLE 150 MG PO TABS: 150.0000 mg | ORAL_TABLET | Freq: Once | ORAL | 0 refills | Status: AC

## 2019-11-16 NOTE — Telephone Encounter (Signed)
Spoke with pharmacist Loletha Grayer he is going to dispense 60 grams of the medication Clobetasol. Dr. Sharlet Salina is aware

## 2019-11-16 NOTE — Patient Instructions (Signed)
We have sent in diflucan to take for the rash.  We have sent in the stronger cream clobetasol to use on the rash under the breast and around the groin and around the ears.

## 2019-11-16 NOTE — Progress Notes (Signed)
   Subjective:   Patient ID: Deanna Olson, female    DOB: 1926-04-26, 86 y.o.   MRN: [id number]  HPI The patient is a 86 YO female coming in for rash. Started years ago and was getting some better in the past. She has it under the breasts and around the groin and behind the ears. She is using ketoconazole shampoo 1-2 times per week and this has helped with the rash on the scalp. There is some burning pain with the rash and no itching currently. She is using cornstarch to help with moisture but knows that moisture is in these areas. Has had before.   Review of Systems  Constitutional: Negative.   HENT: Negative.   Eyes: Negative.   Respiratory: Negative for cough, chest tightness and shortness of breath.   Cardiovascular: Negative for chest pain, palpitations and leg swelling.  Gastrointestinal: Negative for abdominal distention, abdominal pain, constipation, diarrhea, nausea and vomiting.  Musculoskeletal: Negative.   Skin: Positive for rash.  Neurological: Negative.   Psychiatric/Behavioral: Negative.     Objective:  Physical Exam Constitutional:      Appearance: She is well-developed.  HENT:     Head: Normocephalic and atraumatic.  Cardiovascular:     Rate and Rhythm: Normal rate and regular rhythm.  Pulmonary:     Effort: Pulmonary effort is normal. No respiratory distress.     Breath sounds: Normal breath sounds. No wheezing or rales.  Abdominal:     General: Bowel sounds are normal. There is no distension.     Palpations: Abdomen is soft.     Tenderness: There is no abdominal tenderness. There is no rebound.  Musculoskeletal:     Cervical back: Normal range of motion.  Skin:    General: Skin is warm and dry.     Findings: Rash present.     Comments: Rash consistent with fungal under the breasts, around groin folds. Rash consistent with contact dermatitis behind ears bilateral  Neurological:     Mental Status: She is alert and oriented to person, place, and time.   Coordination: Coordination normal.     Vitals:   11/16/19 1341  BP: 136/80  Pulse: 70  Temp: 97.9 F (36.6 C)  SpO2: 99%  Weight: 159 lb 3.2 oz (72.2 kg)  Height: 5\' 4"  (1.626 m)   This visit occurred during the SARS-CoV-2 public health emergency.  Safety protocols were in place, including screening questions prior to the visit, additional usage of staff PPE, and extensive cleaning of exam room while observing appropriate contact time as indicated for disinfecting solutions.   Assessment & Plan:

## 2019-11-17 NOTE — Assessment & Plan Note (Signed)
Rx diflucan and clobetasol ointment to use for severe rash. Keep using cornstarch for moisture reduction and can air dry after showers.

## 2019-12-19 ENCOUNTER — Other Ambulatory Visit: Payer: Self-pay | Admitting: Internal Medicine

## 2019-12-27 ENCOUNTER — Ambulatory Visit (INDEPENDENT_AMBULATORY_CARE_PROVIDER_SITE_OTHER): Payer: PPO | Admitting: Internal Medicine

## 2019-12-27 ENCOUNTER — Ambulatory Visit (INDEPENDENT_AMBULATORY_CARE_PROVIDER_SITE_OTHER): Payer: PPO

## 2019-12-27 ENCOUNTER — Encounter: Payer: Self-pay | Admitting: Internal Medicine

## 2019-12-27 ENCOUNTER — Other Ambulatory Visit: Payer: Self-pay

## 2019-12-27 VITALS — BP 136/82 | HR 65 | Temp 98.8°F | Ht 64.0 in | Wt 160.0 lb

## 2019-12-27 DIAGNOSIS — R21 Rash and other nonspecific skin eruption: Secondary | ICD-10-CM

## 2019-12-27 DIAGNOSIS — S32030A Wedge compression fracture of third lumbar vertebra, initial encounter for closed fracture: Secondary | ICD-10-CM | POA: Diagnosis not present

## 2019-12-27 DIAGNOSIS — G629 Polyneuropathy, unspecified: Secondary | ICD-10-CM

## 2019-12-27 DIAGNOSIS — M545 Low back pain, unspecified: Secondary | ICD-10-CM

## 2019-12-27 MED ORDER — GABAPENTIN 100 MG PO CAPS: 100.0000 mg | ORAL_CAPSULE | Freq: Every day | ORAL | 3 refills | Status: DC

## 2019-12-27 MED ORDER — TRIAMCINOLONE ACETONIDE 0.1 % EX CREA: 1.0000 "application " | TOPICAL_CREAM | Freq: Two times a day (BID) | CUTANEOUS | 11 refills | Status: DC

## 2019-12-27 NOTE — Assessment & Plan Note (Signed)
In legs with burning at night time. Prior L3 compression fracture likely cause. Previously has tolerated gabapentin well. Rx gabapentin 100 mg QHS. Can adjust dosing as needed.

## 2019-12-27 NOTE — Patient Instructions (Addendum)
We have sent in triamcinolone to use twice a day on the rash long term.  For tylenol do not go over 3000 mg in 24 hours.  We have sent in gabapentin to take 1 pill in the evening for the tingling in the bed.

## 2019-12-27 NOTE — Assessment & Plan Note (Signed)
Checking x-ray lumbar today for any progression given worsening pain and new tingling at night time.

## 2019-12-27 NOTE — Progress Notes (Signed)
   Subjective:   Patient ID: Deanna Olson, female    DOB: 01/08/1926, 86 y.o.   MRN: [id number]  HPI The patient is a 86 YO female coming in for low back pain (more in the left side, also in the middle, using lidocaine patches with decent success for pain, also using tylenol BID if needed, denies new falls, does have some increase in burning pain in her legs at night time) and leg burning at night time (mostly at night time, overall worsening, she is bothered by this and would like to try something, did have this bad after compression fracture but gradually improved after kyphoplasty, has been on gabapentin in the past with good relief) and rash (rash behind ears has gone completely after diflucan pill and use of clobetasol ointment, her rash under breasts and inguinal folds are much improved, once she ran out of clobetasol ointment has just been using a moisture guard otc with reasonable success, denies pain and itching is gone since last visit 1 month ago).   Review of Systems  Constitutional: Negative.   HENT: Negative.   Eyes: Negative.   Respiratory: Negative for cough, chest tightness and shortness of breath.   Cardiovascular: Negative for chest pain, palpitations and leg swelling.  Gastrointestinal: Negative for abdominal distention, abdominal pain, constipation, diarrhea, nausea and vomiting.  Musculoskeletal: Positive for arthralgias and back pain.  Skin: Positive for rash.  Neurological: Positive for numbness.       Tingling pain in legs night time  Psychiatric/Behavioral: Negative.     Objective:  Physical Exam Constitutional:      Appearance: She is well-developed.  HENT:     Head: Normocephalic and atraumatic.  Cardiovascular:     Rate and Rhythm: Normal rate and regular rhythm.  Pulmonary:     Effort: Pulmonary effort is normal. No respiratory distress.     Breath sounds: Normal breath sounds. No wheezing or rales.  Abdominal:     General: Bowel sounds are normal. There  is no distension.     Palpations: Abdomen is soft.     Tenderness: There is no abdominal tenderness. There is no rebound.  Musculoskeletal:        General: Tenderness present.     Cervical back: Normal range of motion.  Skin:    General: Skin is warm and dry.     Findings: Rash present.     Comments: Rash behind ears bilaterally is gone, rash under breasts is much improved and some scaling of the skin but no redness, inguinal rash also improved with some scaling but no redness  Neurological:     Mental Status: She is alert and oriented to person, place, and time.     Coordination: Coordination normal.     Vitals:   12/27/19 1056  BP: 136/82  Pulse: 65  Temp: 98.8 F (37.1 C)  TempSrc: Oral  SpO2: 97%  Weight: 160 lb (72.6 kg)  Height: 5\' 4"  (1.626 m)    This visit occurred during the SARS-CoV-2 public health emergency.  Safety protocols were in place, including screening questions prior to the visit, additional usage of staff PPE, and extensive cleaning of exam room while observing appropriate contact time as indicated for disinfecting solutions.   Assessment & Plan:

## 2019-12-27 NOTE — Assessment & Plan Note (Signed)
Has done well, needs long term management. Rx triamcinolone ointment to use BID long term. Uses air drying to help.

## 2020-01-07 ENCOUNTER — Telehealth: Payer: Self-pay | Admitting: Internal Medicine

## 2020-01-07 NOTE — Telephone Encounter (Signed)
    Patient's daughter Jeani Hawking calling to discuss imaging results

## 2020-01-09 NOTE — Telephone Encounter (Signed)
Pt's daughter has been informed and expressed understanding.  

## 2020-01-11 ENCOUNTER — Other Ambulatory Visit: Payer: Self-pay | Admitting: Internal Medicine

## 2020-01-11 NOTE — Telephone Encounter (Signed)
1.Medication Requested:ALPRAZolam (XANAX) 0.5 MG tablet  2. Pharmacy (Name, [place], [place]):[facility] [place], [place]  3. On Med List: Yes   4. Last Visit with PCP: 7.15.21   5. Next visit date with PCP: n/a   Agent: Please be advised that RX refills may take up to 3 business days. We ask that you follow-up with your pharmacy.

## 2020-01-11 NOTE — Telephone Encounter (Signed)
11/12/2019 Alprazolam 0.5 Mg Tablet 60#  Last ov 11/16/19 Next ov n/s

## 2020-01-29 ENCOUNTER — Telehealth: Payer: Self-pay

## 2020-01-29 NOTE — Telephone Encounter (Deleted)
Error

## 2020-02-05 ENCOUNTER — Other Ambulatory Visit: Payer: Self-pay

## 2020-02-05 ENCOUNTER — Encounter: Payer: Self-pay | Admitting: Family

## 2020-02-05 ENCOUNTER — Ambulatory Visit (INDEPENDENT_AMBULATORY_CARE_PROVIDER_SITE_OTHER): Payer: PPO | Admitting: Family

## 2020-02-05 VITALS — BP 124/80 | HR 98 | Temp 98.4°F | Ht 64.0 in | Wt 161.0 lb

## 2020-02-05 DIAGNOSIS — L989 Disorder of the skin and subcutaneous tissue, unspecified: Secondary | ICD-10-CM

## 2020-02-05 DIAGNOSIS — L304 Erythema intertrigo: Secondary | ICD-10-CM

## 2020-02-05 MED ORDER — TRIAMCINOLONE ACETONIDE 0.1 % EX CREA: 1.0000 "application " | TOPICAL_CREAM | Freq: Two times a day (BID) | CUTANEOUS | 1 refills | Status: DC

## 2020-02-05 MED ORDER — FLUCONAZOLE 100 MG PO TABS: 100.0000 mg | ORAL_TABLET | Freq: Every day | ORAL | 0 refills | Status: DC

## 2020-02-05 MED ORDER — NYSTATIN-TRIAMCINOLONE 100000-0.1 UNIT/GM-% EX CREA: 1.0000 "application " | TOPICAL_CREAM | Freq: Two times a day (BID) | CUTANEOUS | 0 refills | Status: DC

## 2020-02-05 NOTE — Progress Notes (Signed)
Deanna Olson is a 86 y.o. female with the following history as recorded in [employer]:  Patient Active Problem List   Diagnosis Date Noted  . Neuropathy 12/27/2019  . Acute pain of left knee 02/09/2019  . Osteoporosis 02/20/2018  . Cough 02/20/2018  . Rash 01/27/2017  . Low back pain 07/06/2016  . Right knee pain 07/06/2016  . CKD (chronic kidney disease) stage 3, GFR 30-59 ml/min 08/13/2014  . Routine general medical examination at a health care facility 08/13/2014  . Vitamin D deficiency 12/02/2009  . AORTIC VALVE DISEASE 01/24/2008  . DEGENERATIVE JOINT DISEASE 11/05/2007  . Hypothyroidism 10/31/2007  . Essential hypertension 10/31/2007  . Allergic rhinitis 10/31/2007  . Hyperlipidemia 04/12/2007  . Anxiety state 04/12/2007  . GERD 04/12/2007    Current Outpatient Medications  Medication Sig Dispense Refill  . acetaminophen (TYLENOL) 500 MG tablet Take 500 mg by mouth daily as needed for moderate pain.    Marland Kitchen alendronate (FOSAMAX) 70 MG tablet TAKE 1 TABLET BY MOUTH ONCE WEEKLY ON AN EMPTY STOMACH BEFORE BREAKFAST. REMAIN UPRIGHT FOR 30 MINUTES & TAKE WITH 8 OUNCES OF WATER 8 tablet 0  . ALPRAZolam (XANAX) 0.5 MG tablet TAKE ONE TABLET BY MOUTH TWICE A DAY 60 tablet 5  . b complex vitamins capsule Take 1 capsule by mouth daily.    . Cholecalciferol (VITAMIN D3 PO) Take 1 tablet by mouth daily.    . diclofenac sodium (VOLTAREN) 1 % GEL Apply 2 g topically as needed.    . diphenhydramine-acetaminophen (TYLENOL PM) 25-500 MG TABS tablet Take 1 tablet by mouth at bedtime as needed.    . gabapentin (NEURONTIN) 100 MG capsule Take 1 capsule (100 mg total) by mouth at bedtime. 90 capsule 3  . ketoconazole (NIZORAL) 2 % shampoo Use twice weekly. 120 mL 0  . levothyroxine (SYNTHROID) 50 MCG tablet Take 1 tablet (50 mcg total) by mouth daily before breakfast. 90 tablet 3  . lisinopril (ZESTRIL) 20 MG tablet TAKE ONE TABLET BY MOUTH DAILY 90 tablet 0  . Multiple Minerals-Vitamins (CALCIUM  CITRATE-MAG-MINERALS PO) Take 1 tablet by mouth daily.    Marland Kitchen omeprazole (PRILOSEC) 20 MG capsule TAKE ONE CAPSULE BY MOUTH DAILY ON AN EMPTY STOMACH 90 capsule 0  . Potassium Aminobenzoate 500 MG CAPS Take 1 capsule by mouth daily.    . pravastatin (PRAVACHOL) 20 MG tablet TAKE ONE TABLET BY MOUTH DAILY 90 tablet 0  . triamcinolone cream (KENALOG) 0.1 % Apply 1 application topically 2 (two) times daily. 100 g 1  . vitamin C (ASCORBIC ACID) 500 MG tablet Take 500 mg by mouth daily.    . Vitamin E 400 UNITS TABS Take 1 tablet by mouth daily.    . fluconazole (DIFLUCAN) 100 MG tablet Take 1 tablet (100 mg total) by mouth daily. 7 tablet 0  . nystatin-triamcinolone (MYCOLOG II) cream Apply 1 application topically 2 (two) times daily. 60 g 0   No current facility-administered medications for this visit.    Allergies: Sulfonamide derivatives  Past Medical History:  Diagnosis Date  . Allergic rhinitis, cause unspecified   . Anxiety state, unspecified   . Aortic valve disorders    Mild AS 2013 echo   . Eczema   . Esophageal reflux   . Herpes zoster without mention of complication   . Hyperlipidemia   . Hypertension   . Hypothyroid   . Osteoarthrosis, unspecified whether generalized or localized, unspecified site   . Unspecified venous (peripheral) insufficiency  Past Surgical History:  Procedure Laterality Date  . CATARACT EXTRACTION, BILATERAL  2013  . KYPHOPLASTY  02/2018  . VESICOVAGINAL FISTULA CLOSURE W/ TAH      Family History  Problem Relation Age of Onset  . Stroke Mother 10  . Diabetes Mother   . Diabetes Brother   . Heart attack Brother 62       s/p open heart surg  . CAD Brother 16  . Cancer Sister        4 types    Social History   Tobacco Use  . Smoking status: Never Smoker  . Smokeless tobacco: Never Used  Substance Use Topics  . Alcohol use: Never    Alcohol/week: 0.0 standard drinks    Subjective:  Accompanied by daughter; history of intertrigo; feels  like symptoms have flared up again; last treated in June and did very well; requesting refill on Diflucan;   Objective:  Vitals:   02/05/20 1218  BP: 124/80  Pulse: 98  Temp: 98.4 F (36.9 C)  TempSrc: Oral  SpO2: 98%  Weight: 161 lb (73 kg)  Height: 5\' 4"  (1.626 m)    General: Well developed, well nourished, in no acute distress  Skin : Warm and dry. Scabbed lesion noted on right cheek Red, erythematous rash noted beneath both breasts/ inguinal regions Head: Normocephalic and atraumatic  Eyes: Sclera and conjunctiva clear; pupils round and reactive to light; extraocular movements intact  Lungs: Respirations unlabored;  Neurologic: Alert and oriented; speech intact; face symmetrical; moves all extremities well; CNII-XII intact without focal deficit   Assessment:  1. Intertrigo   2. Skin lesion     Plan:  1. Rx for Diflucan 100 mg qd x 5 days, Mycolog II apply bid to affected area; 2. Concern for basal cell on right cheek- refer to dermatology;  This visit occurred during the SARS-CoV-2 public health emergency.  Safety protocols were in place, including screening questions prior to the visit, additional usage of staff PPE, and extensive cleaning of exam room while observing appropriate contact time as indicated for disinfecting solutions.     No follow-ups on file.  Orders Placed This Encounter  Procedures  . Ambulatory referral to Dermatology    Referral Priority:   Routine    Referral Type:   Consultation    Referral Reason:   Specialty Services Required    Requested Specialty:   Dermatology    Number of Visits Requested:   1    Requested Prescriptions   Signed Prescriptions Disp Refills  . triamcinolone cream (KENALOG) 0.1 % 100 g 1    Sig: Apply 1 application topically 2 (two) times daily.  . fluconazole (DIFLUCAN) 100 MG tablet 7 tablet 0    Sig: Take 1 tablet (100 mg total) by mouth daily.  Marland Kitchen nystatin-triamcinolone (MYCOLOG II) cream 60 g 0    Sig: Apply 1  application topically 2 (two) times daily.

## 2020-02-05 NOTE — Patient Instructions (Signed)
Intertrigo Intertrigo is skin irritation (inflammation) that happens in warm, moist areas of the body. The irritation can cause a rash and make skin raw and itchy. The rash is usually pink or red. It happens mostly between folds of skin or where skin rubs together, such as:  Between the toes.  In the armpits.  In the groin area.  Under the belly.  Under the breasts.  Around the butt area. This condition is not passed from person to person (is not contagious). What are the causes?  Heat, moisture, rubbing, and not enough air movement.  The condition can be made worse by: ? Sweat. ? Bacteria. ? A fungus, such as yeast. What increases the risk?  Moisture in your skin folds.  You are more likely to develop this condition if you: ? Have diabetes. ? Are overweight. ? Are not able to move around. ? Live in a warm and moist climate. ? Wear splints, braces, or other medical devices. ? Are not able to control your pee (urine) or poop (stool). What are the signs or symptoms?  A pink or red skin rash in the skin fold or near the skin fold.  Raw or scaly skin.  Itching.  A burning feeling.  Bleeding.  Leaking fluid.  A bad smell. How is this treated?  Cleaning and drying your skin.  Taking an antibiotic medicine or using an antibiotic skin cream for a bacterial infection.  Using an antifungal cream on your skin or taking pills for an infection that was caused by a fungus, such as yeast.  Using a steroid ointment to stop the itching and irritation.  Separating the skin fold with a clean cotton cloth to absorb moisture and allow air to flow into the area. Follow these instructions at home:  Keep the affected area clean and dry.  Do not scratch your skin.  Stay cool as much as you can. Use an air conditioner or a fan, if you have one.  Apply over-the-counter and prescription medicines only as told by your doctor.  If you were prescribed an antibiotic medicine,  use it as told by your doctor. Do not stop using the antibiotic even if your condition starts to get better.  Keep all follow-up visits as told by your doctor. This is important. How is this prevented?   Stay at a healthy weight.  Take care of your feet. This is very important if you have diabetes. You should: ? Wear shoes that fit well. ? Keep your feet dry. ? Wear clean cotton or wool socks.  Protect the skin in your groin and butt area as told by your doctor. To do this: ? Follow a regular cleaning routine. ? Use creams, powders, or ointments that protect your skin. ? Change protection pads often.  Do not wear tight clothes. Wear clothes that: ? Are loose. ? Take moisture away from your body. ? Are made of cotton.  Wear a bra that gives good support, if needed.  Shower and dry yourself well after being active. Use a hair dryer on a cool setting to dry between skin folds.  Keep your blood sugar under control if you have diabetes. Contact a doctor if:  Your symptoms do not get better with treatment.  Your symptoms get worse or they spread.  You notice more redness and warmth.  You have a fever. Summary  Intertrigo is skin irritation that occurs when folds of skin rub together.  This condition is caused by heat, moisture,   and rubbing.  This condition may be treated by cleaning and drying your skin and with medicines.  Apply over-the-counter and prescription medicines only as told by your doctor.  Keep all follow-up visits as told by your doctor. This is important. This information is not intended to replace advice given to you by your health care provider. Make sure you discuss any questions you have with your health care provider. Document Revised: 03/09/2018 Document Reviewed: 03/09/2018 Elsevier Patient Education  2020 Elsevier Inc.  

## 2020-02-08 ENCOUNTER — Telehealth: Payer: Self-pay | Admitting: Internal Medicine

## 2020-02-08 NOTE — Telephone Encounter (Signed)
   Patient saw NP, Valere Dross on 8/26. She is calling to report diarrhea. She feels the fluconazole (DIFLUCAN) 100 MG tablet is causing the diarrhea  Seeking advice

## 2020-02-08 NOTE — Telephone Encounter (Signed)
Tried to reach patient x 4 times but line is busy.

## 2020-02-08 NOTE — Telephone Encounter (Signed)
Stop the Diflucan and let's see if the diarrhea resolves over the weekend. She may only be able to tolerate 1 dose of Diflucan like she has taken in the past.

## 2020-02-13 NOTE — Telephone Encounter (Signed)
Spoke with patient and symptoms resolved.

## 2020-02-17 ENCOUNTER — Other Ambulatory Visit: Payer: Self-pay | Admitting: Internal Medicine

## 2020-03-15 ENCOUNTER — Other Ambulatory Visit: Payer: Self-pay | Admitting: Internal Medicine

## 2020-03-25 DIAGNOSIS — L570 Actinic keratosis: Secondary | ICD-10-CM | POA: Diagnosis not present

## 2020-03-25 DIAGNOSIS — L218 Other seborrheic dermatitis: Secondary | ICD-10-CM | POA: Diagnosis not present

## 2020-03-25 DIAGNOSIS — L304 Erythema intertrigo: Secondary | ICD-10-CM | POA: Diagnosis not present

## 2020-04-01 ENCOUNTER — Telehealth: Payer: Self-pay | Admitting: Internal Medicine

## 2020-04-01 MED ORDER — LEVOTHYROXINE SODIUM 50 MCG PO TABS: 50.0000 ug | ORAL_TABLET | Freq: Every day | ORAL | 1 refills | Status: DC

## 2020-04-01 NOTE — Telephone Encounter (Signed)
   Patient requesting refill for levothyroxine (SYNTHROID) 50 MCG tablet to [facility], [address]

## 2020-04-18 DIAGNOSIS — H43813 Vitreous degeneration, bilateral: Secondary | ICD-10-CM | POA: Diagnosis not present

## 2020-04-18 DIAGNOSIS — H353132 Nonexudative age-related macular degeneration, bilateral, intermediate dry stage: Secondary | ICD-10-CM | POA: Diagnosis not present

## 2020-04-18 DIAGNOSIS — H04123 Dry eye syndrome of bilateral lacrimal glands: Secondary | ICD-10-CM | POA: Diagnosis not present

## 2020-04-18 DIAGNOSIS — Z961 Presence of intraocular lens: Secondary | ICD-10-CM | POA: Diagnosis not present

## 2020-04-26 ENCOUNTER — Ambulatory Visit: Payer: PPO | Attending: Internal Medicine

## 2020-04-26 DIAGNOSIS — Z23 Encounter for immunization: Secondary | ICD-10-CM

## 2020-04-26 NOTE — Progress Notes (Signed)
   Covid-19 Vaccination Clinic  Name:  Deanna Olson    MRN: [id number] DOB: 21-Jun-1925  04/26/2020  Deanna Olson was observed post Covid-19 immunization for 15 minutes without incident. She was provided with Vaccine Information Sheet and instruction to access the V-Safe system.   Deanna Olson was instructed to call 911 with any severe reactions post vaccine: Marland Kitchen Difficulty breathing  . Swelling of face and throat  . A fast heartbeat  . A bad rash all over body  . Dizziness and weakness   Immunizations Administered    Name Date Dose VIS Date Route   Pfizer COVID-19 Vaccine 04/26/2020 11:28 AM 0.3 mL 04/02/2020 Intramuscular   Manufacturer: [place]   Lot: [id number]   [place]: [id number]

## 2020-04-30 ENCOUNTER — Telehealth: Payer: Self-pay | Admitting: Internal Medicine

## 2020-04-30 NOTE — Telephone Encounter (Signed)
   Heather a FNP with [employer] had a visit with the patient today and found that the patient has a heart murmer and the patient was not aware of this. Patient is asmptomatic at this time and Nira Conn just wants to make sure someone follows up on this at the next visit in the office. Heather# [phone]

## 2020-04-30 NOTE — Telephone Encounter (Signed)
Please advise  Heather a FNP with [employer] had a visit with the patient today and found that the patient has a heart murmer and the patient was not aware of this. Patient is asmptomatic at this time and Nira Conn just wants to make sure someone follows up on this at the next visit in the office. Heather# [phone]

## 2020-05-01 NOTE — Telephone Encounter (Signed)
Noted  

## 2020-05-20 DIAGNOSIS — L304 Erythema intertrigo: Secondary | ICD-10-CM | POA: Diagnosis not present

## 2020-05-20 DIAGNOSIS — D485 Neoplasm of uncertain behavior of skin: Secondary | ICD-10-CM | POA: Diagnosis not present

## 2020-06-16 ENCOUNTER — Other Ambulatory Visit: Payer: Self-pay

## 2020-06-16 ENCOUNTER — Telehealth (INDEPENDENT_AMBULATORY_CARE_PROVIDER_SITE_OTHER): Payer: PPO | Admitting: Family Medicine

## 2020-06-16 DIAGNOSIS — R059 Cough, unspecified: Secondary | ICD-10-CM

## 2020-06-16 MED ORDER — BENZONATATE 100 MG PO CAPS: 100.0000 mg | ORAL_CAPSULE | Freq: Three times a day (TID) | ORAL | 0 refills | Status: DC | PRN

## 2020-06-16 NOTE — Progress Notes (Signed)
Virtual Visit via Telephone Note  I connected with Deanna Olson on 06/16/20 at  3:00 PM EST by telephone and verified that I am speaking with the correct person using two identifiers.   I discussed the limitations, risks, security and privacy concerns of performing an evaluation and management service by telephone and the availability of in person appointments. I also discussed with the patient that there may be a patient responsible charge related to this service. The patient expressed understanding and agreed to proceed.  Location patient: home, [place] Location provider: work or home office Participants present for the call: patient, provider Patient did not have a visit with me in the prior 7 days to address this/these issue(s).   History of Present Illness:  Acute telemedicine visit for a Cough : -Onset:7-8 days ago -Symptoms include: nasal congestion, sore throat, cough -symptoms pretty mild, but is feeling better - mucus is clear but lingering mild cough -Denies:fevers, cp, sob, ha, NVD, malaise -Has tried:several otc medications -Pertinent past medical history: HTN, hypothyroidism, gerd, AR, CKD -Pertinent medication allergies:sulfa -COVID-19 vaccine status: fully vaccinated + booster; had flu shot   Observations/Objective: Patient sounds cheerful and well on the phone. I do not appreciate any SOB. Speech and thought processing are grossly intact. Patient reported vitals:  Assessment and Plan:  Cough  -we discussed possible serious and likely etiologies, options for evaluation and workup, limitations of telemedicine visit vs in person visit, treatment, treatment risks and precautions. Pt prefers to treat via telemedicine empirically rather than in person at this moment.  Query viral upper respiratory illness, possible mild breakthrough Covid versus other.  She reports she is actually doing much better at this point and denies any severe symptoms or fatigue or malaise.  She has  opted for a cough medication, Tessalon and monitoring.  We did talk about the possibility of Covid, but she is not sure how helpful testing will be at this point.  She will consider testing if she can find it. Scheduled follow up with PCP offered: Agrees to follow-up if needed. Advised to seek prompt in person care if worsening, new symptoms arise, or if is not improving with treatment. Advised of options for inperson care in case PCP office not available. Did let the patient know that I only do telemedicine shifts for [facility] on Tuesdays and Thursdays and advised a follow up visit with PCP or at an [facility] if has further questions or concerns.   Follow Up Instructions:  I did not refer this patient for an OV with me in the next 24 hours for this/these issue(s).  I discussed the assessment and treatment plan with the patient. The patient was provided an opportunity to ask questions and all were answered. The patient agreed with the plan and demonstrated an understanding of the instructions.   I spent 12 minutes on the date of this visit in the care of this patient. See summary of tasks completed to properly care for this patient in the detailed notes above which include review of PMH, medications, allergies, evaluation of the patient and ordering and instructing patient on testing and care options.     Terressa Koyanagi, DO

## 2020-08-12 ENCOUNTER — Other Ambulatory Visit: Payer: Self-pay

## 2020-08-14 ENCOUNTER — Encounter: Payer: Self-pay | Admitting: Internal Medicine

## 2020-08-14 ENCOUNTER — Other Ambulatory Visit: Payer: Self-pay

## 2020-08-14 ENCOUNTER — Ambulatory Visit (INDEPENDENT_AMBULATORY_CARE_PROVIDER_SITE_OTHER): Payer: PPO | Admitting: Internal Medicine

## 2020-08-14 VITALS — BP 132/70 | HR 62 | Temp 98.0°F | Resp 18 | Ht 64.0 in | Wt 155.4 lb

## 2020-08-14 DIAGNOSIS — H9193 Unspecified hearing loss, bilateral: Secondary | ICD-10-CM | POA: Diagnosis not present

## 2020-08-14 DIAGNOSIS — N183 Chronic kidney disease, stage 3 unspecified: Secondary | ICD-10-CM

## 2020-08-14 DIAGNOSIS — E782 Mixed hyperlipidemia: Secondary | ICD-10-CM | POA: Diagnosis not present

## 2020-08-14 DIAGNOSIS — E039 Hypothyroidism, unspecified: Secondary | ICD-10-CM

## 2020-08-14 DIAGNOSIS — I10 Essential (primary) hypertension: Secondary | ICD-10-CM

## 2020-08-14 LAB — COMPREHENSIVE METABOLIC PANEL
ALT: 12 U/L (ref 0–35)
AST: 20 U/L (ref 0–37)
Albumin: 4.1 g/dL (ref 3.5–5.2)
Alkaline Phosphatase: 42 U/L (ref 39–117)
BUN: 26 mg/dL — ABNORMAL HIGH (ref 6–23)
CO2: 27 mEq/L (ref 19–32)
Calcium: 9.8 mg/dL (ref 8.4–10.5)
Chloride: 106 mEq/L (ref 96–112)
Creatinine, Ser: 1 mg/dL (ref 0.40–1.20)
GFR: 48.9 mL/min — ABNORMAL LOW (ref >60.00)
Glucose, Bld: 100 mg/dL — ABNORMAL HIGH (ref 70–99)
Potassium: 4.9 mEq/L (ref 3.5–5.1)
Sodium: 138 mEq/L (ref 135–145)
Total Bilirubin: 0.4 mg/dL (ref 0.2–1.2)
Total Protein: 7.4 g/dL (ref 6.0–8.3)

## 2020-08-14 LAB — CBC
HCT: 37 % (ref 36.0–46.0)
Hemoglobin: 12.4 g/dL (ref 12.0–15.0)
MCHC: 33.4 g/dL (ref 30.0–36.0)
MCV: 93 fl (ref 78.0–100.0)
Platelets: 191 10*3/uL (ref 150.0–400.0)
RBC: 3.98 Mil/uL (ref 3.87–5.11)
RDW: 15.5 % (ref 11.5–15.5)
WBC: 6.8 10*3/uL (ref 4.0–10.5)

## 2020-08-14 LAB — TSH: TSH: 2.82 u[IU]/mL (ref 0.35–4.50)

## 2020-08-14 LAB — LIPID PANEL
Cholesterol: 237 mg/dL — ABNORMAL HIGH (ref 0–200)
HDL: 47.4 mg/dL (ref >39.00)
NonHDL: 189.54
Total CHOL/HDL Ratio: 5
Triglycerides: 347 mg/dL — ABNORMAL HIGH (ref 0.0–149.0)
VLDL: 69.4 mg/dL — ABNORMAL HIGH (ref 0.0–40.0)

## 2020-08-14 LAB — LDL CHOLESTEROL, DIRECT: Direct LDL: 161 mg/dL

## 2020-08-14 NOTE — Patient Instructions (Addendum)
We will check the labs today and get you in with the hearing specialist.    Health Maintenance, Female Adopting a healthy lifestyle and getting preventive care are important in promoting health and wellness. Ask your health care provider about:  The right schedule for you to have regular tests and exams.  Things you can do on your own to prevent diseases and keep yourself healthy. What should I know about diet, weight, and exercise? Eat a healthy diet  Eat a diet that includes plenty of vegetables, fruits, low-fat dairy products, and lean protein.  Do not eat a lot of foods that are high in solid fats, added sugars, or sodium.   Maintain a healthy weight Body mass index (BMI) is used to identify weight problems. It estimates body fat based on height and weight. Your health care provider can help determine your BMI and help you achieve or maintain a healthy weight. Get regular exercise Get regular exercise. This is one of the most important things you can do for your health. Most adults should:  Exercise for at least 150 minutes each week. The exercise should increase your heart rate and make you sweat (moderate-intensity exercise).  Do strengthening exercises at least twice a week. This is in addition to the moderate-intensity exercise.  Spend less time sitting. Even light physical activity can be beneficial. Watch cholesterol and blood lipids Have your blood tested for lipids and cholesterol at 85 years of age, then have this test every 5 years. Have your cholesterol levels checked more often if:  Your lipid or cholesterol levels are high.  You are older than 85 years of age.  You are at high risk for heart disease. What should I know about cancer screening? Depending on your health history and family history, you may need to have cancer screening at various ages. This may include screening for:  Breast cancer.  Cervical cancer.  Colorectal cancer.  Skin cancer.  Lung  cancer. What should I know about heart disease, diabetes, and high blood pressure? Blood pressure and heart disease  High blood pressure causes heart disease and increases the risk of stroke. This is more likely to develop in people who have high blood pressure readings, are of African descent, or are overweight.  Have your blood pressure checked: ? Every 3-5 years if you are 76-71 years of age. ? Every year if you are 55 years old or older. Diabetes Have regular diabetes screenings. This checks your fasting blood sugar level. Have the screening done:  Once every three years after age 70 if you are at a normal weight and have a low risk for diabetes.  More often and at a younger age if you are overweight or have a high risk for diabetes. What should I know about preventing infection? Hepatitis B If you have a higher risk for hepatitis B, you should be screened for this virus. Talk with your health care provider to find out if you are at risk for hepatitis B infection. Hepatitis C Testing is recommended for:  Everyone born from [birth year] through 1963.  Anyone with known risk factors for hepatitis C. Sexually transmitted infections (STIs)  Get screened for STIs, including gonorrhea and chlamydia, if: ? You are sexually active and are younger than 85 years of age. ? You are older than 85 years of age and your health care provider tells you that you are at risk for this type of infection. ? Your sexual activity has changed since you were  last screened, and you are at increased risk for chlamydia or gonorrhea. Ask your health care provider if you are at risk.  Ask your health care provider about whether you are at high risk for HIV. Your health care provider may recommend a prescription medicine to help prevent HIV infection. If you choose to take medicine to prevent HIV, you should first get tested for HIV. You should then be tested every 3 months for as long as you are taking the  medicine. Pregnancy  If you are about to stop having your period (premenopausal) and you may become pregnant, seek counseling before you get pregnant.  Take 400 to 800 micrograms (mcg) of folic acid every day if you become pregnant.  Ask for birth control (contraception) if you want to prevent pregnancy. Osteoporosis and menopause Osteoporosis is a disease in which the bones lose minerals and strength with aging. This can result in bone fractures. If you are 79 years old or older, or if you are at risk for osteoporosis and fractures, ask your health care provider if you should:  Be screened for bone loss.  Take a calcium or vitamin D supplement to lower your risk of fractures.  Be given hormone replacement therapy (HRT) to treat symptoms of menopause. Follow these instructions at home: Lifestyle  Do not use any products that contain nicotine or tobacco, such as cigarettes, e-cigarettes, and chewing tobacco. If you need help quitting, ask your health care provider.  Do not use street drugs.  Do not share needles.  Ask your health care provider for help if you need support or information about quitting drugs. Alcohol use  Do not drink alcohol if: ? Your health care provider tells you not to drink. ? You are pregnant, may be pregnant, or are planning to become pregnant.  If you drink alcohol: ? Limit how much you use to 0-1 drink a day. ? Limit intake if you are breastfeeding.  Be aware of how much alcohol is in your drink. In the [place] one drink equals one 12 oz bottle of beer (355 mL), one 5 oz glass of wine (148 mL), or one 1 oz glass of hard liquor (44 mL). General instructions  Schedule regular health, dental, and eye exams.  Stay current with your vaccines.  Tell your health care provider if: ? You often feel depressed. ? You have ever been abused or do not feel safe at home. Summary  Adopting a healthy lifestyle and getting preventive care are important in  promoting health and wellness.  Follow your health care provider's instructions about healthy diet, exercising, and getting tested or screened for diseases.  Follow your health care provider's instructions on monitoring your cholesterol and blood pressure. This information is not intended to replace advice given to you by your health care provider. Make sure you discuss any questions you have with your health care provider. Document Revised: 05/24/2018 Document Reviewed: 05/24/2018 Elsevier Patient Education  2021 [employer].

## 2020-08-14 NOTE — Progress Notes (Unsigned)
   Subjective:   Patient ID: Deanna Olson, female    DOB: [birth year], 87 y.o.   MRN: [id number]  HPI The patient is a 87 YO female coming in for hearing problems (cannot hear well, family thinks she may need hearing aid, she is willing to evaluate) and follow up blood pressure (taking lisinopril 20 mg daily and BP at goal, denies lightheadedness or chest pains or headaches) and cholesterol (taking pravastatin 20 mg daily, denies chest pains or stroke symptoms) and thyroid (taking synthroid 50 mcg daily, denies missing doses, denies heat or cold intolerance).  Review of Systems  Constitutional: Negative.   HENT: Positive for hearing loss.   Eyes: Negative.   Respiratory: Negative for cough, chest tightness and shortness of breath.   Cardiovascular: Negative for chest pain, palpitations and leg swelling.  Gastrointestinal: Negative for abdominal distention, abdominal pain, constipation, diarrhea, nausea and vomiting.  Musculoskeletal: Negative.   Skin: Negative.   Neurological: Negative.   Psychiatric/Behavioral: Negative.     Objective:  Physical Exam Constitutional:      Appearance: She is well-developed and well-nourished.  HENT:     Head: Normocephalic and atraumatic.  Eyes:     Extraocular Movements: EOM normal.  Cardiovascular:     Rate and Rhythm: Normal rate and regular rhythm.  Pulmonary:     Effort: Pulmonary effort is normal. No respiratory distress.     Breath sounds: Normal breath sounds. No wheezing or rales.  Abdominal:     General: Bowel sounds are normal. There is no distension.     Palpations: Abdomen is soft.     Tenderness: There is no abdominal tenderness. There is no rebound.  Musculoskeletal:        General: No edema.     Cervical back: Normal range of motion.  Skin:    General: Skin is warm and dry.  Neurological:     Mental Status: She is alert and oriented to person, place, and time.     Coordination: Coordination normal.  Psychiatric:        Mood  and Affect: Mood and affect normal.     Vitals:   08/14/20 1414  BP: 132/70  Pulse: 62  Resp: 18  Temp: 98 F (36.7 C)  TempSrc: Oral  SpO2: 98%  Weight: 155 lb 6.4 oz (70.5 kg)  Height: 5\' 4"  (1.626 m)    This visit occurred during the SARS-CoV-2 public health emergency.  Safety protocols were in place, including screening questions prior to the visit, additional usage of staff PPE, and extensive cleaning of exam room while observing appropriate contact time as indicated for disinfecting solutions.   Assessment & Plan:

## 2020-08-15 DIAGNOSIS — H9193 Unspecified hearing loss, bilateral: Secondary | ICD-10-CM | POA: Insufficient documentation

## 2020-08-15 NOTE — Assessment & Plan Note (Signed)
Checking TSH and adjust synthroid 50 mcg daily if needed.

## 2020-08-15 NOTE — Assessment & Plan Note (Signed)
Checking CMP and BP at goal.

## 2020-08-15 NOTE — Assessment & Plan Note (Signed)
Referral to audiology.

## 2020-08-15 NOTE — Assessment & Plan Note (Signed)
Checking lipid panel and adjust pravastatin 20 mg daily as needed. 

## 2020-08-15 NOTE — Assessment & Plan Note (Signed)
BP at goal on lisinopril 20 mg daily. Checking CMP and adjust as needed.

## 2020-09-09 ENCOUNTER — Other Ambulatory Visit: Payer: Self-pay | Admitting: Internal Medicine

## 2020-09-09 NOTE — Telephone Encounter (Signed)
Patient calling requesting a refil

## 2020-09-09 NOTE — Telephone Encounter (Signed)
Unable to get in contact with the patient in regards to her Xanax. No option to leave a voicemail due to the phone constantly ringing.

## 2020-09-09 NOTE — Telephone Encounter (Signed)
Patient is requesting a call back in regards to ALPRAZolam Deanna Olson) 0.5 MG tablet. She can be reached at [phone].

## 2020-11-04 ENCOUNTER — Telehealth: Payer: Self-pay | Admitting: Internal Medicine

## 2020-11-04 NOTE — Progress Notes (Signed)
  Chronic Care Management   Note  11/04/2020 Name: Deanna Olson MRN: [id number] DOB: 1926/05/04  Deanna Olson is a 87 y.o. year old female who is a primary care patient of Hoyt Koch, MD. I reached out to [name] by phone today in response to a referral sent by Deanna Olson's PCP, Hoyt Koch, MD.   Deanna Olson was given information about Chronic Care Management services today including:  1. CCM service includes personalized support from designated clinical staff supervised by her physician, including individualized plan of care and coordination with other care providers 2. 24/7 contact phone numbers for assistance for urgent and routine care needs. 3. Service will only be billed when office clinical staff spend 20 minutes or more in a month to coordinate care. 4. Only one practitioner may furnish and bill the service in a calendar month. 5. The patient may stop CCM services at any time (effective at the end of the month) by phone call to the office staff.   Patient agreed to services and verbal consent obtained.   Follow up plan:   [place]

## 2020-12-05 NOTE — Progress Notes (Signed)
Chronic Care Management Pharmacy Note  12/08/2020 Name:  Deanna Olson MRN:  [id number] DOB:  09-Sep-1925  Summary: - Patient reports that she is no longer taking alendronate, did not have any AE or issues when taking before, just never refilled mediation - Reviewed blood pressure goal for patient (140/90), advised to check blood pressure at least once weekly   Recommendations/Changes made from today's visit: - Patient to hold vitamin E supplementation now that she is taking PreserVision AERDS 2 which has 112m of vitamin E in a serving  - Patient to hold advil liquid gels, used acetaminophen and voltaren gel in setting of CKD - Patient to restart taking alenodronate for osteoporosis, recheck vitamin D level with next PCP appointment   Subjective: Deanna Olson an 87y.o. year old female who is a primary patient of Deanna Koch MD.  The CCM team was consulted for assistance with disease management and care coordination needs.    Engaged with patient by telephone for initial visit in response to provider referral for pharmacy case management and/or care coordination services.   Consent to Services:  The patient was given the following information about Chronic Care Management services today, agreed to services, and gave verbal consent: 1. CCM service includes personalized support from designated clinical staff supervised by the primary care provider, including individualized plan of care and coordination with other care providers 2. 24/7 contact phone numbers for assistance for urgent and routine care needs. 3. Service will only be billed when office clinical staff spend 20 minutes or more in a month to coordinate care. 4. Only one practitioner may furnish and bill the service in a calendar month. 5.The patient may stop CCM services at any time (effective at the end of the month) by phone call to the office staff. 6. The patient will be responsible for cost sharing (co-pay) of up to  20% of the service fee (after annual deductible is met). Patient agreed to services and consent obtained.  Patient Care Team: Deanna Koch MD as PCP - General (Internal Medicine) HMinus Breeding MD as PCP - Cardiology (Cardiology) LDruscilla Brownie MD (Dermatology) SShon Hough MD (Ophthalmology) SDelice BisonDDarnelle Maffucci RH[facility]as Pharmacist (Pharmacist)  Recent office visits: 08/14/2020 - PCP visit - follow up on hearing issue concerns and blood pressure - referred to audiology - no changes to medications   Recent consult visits: 06/16/2020 - Dr. KMaudie Mercury- televideo visit for cough (ongoing for 7-8 days) - benzonatate prescribed   05/20/2020 - Dr. AOuida Sills- Dermatology  04/18/2020 - Dr. SKathrin Penner- Ophthalmology  03/25/2020 - Dr. AOuida Sills- Dermatology   [facility] Cent[facility]its: None in previous 6 months  Objective:  Lab Results  Component Value Date   CREATININE 1.00 08/14/2020   BUN 26 (H) 08/14/2020   GFR 48.90 (L) 08/14/2020   GFRNONAA 45.69 04/03/2010   GFRAA 69 07/29/2008   NA 138 08/14/2020   K 4.9 08/14/2020   CALCIUM 9.8 08/14/2020   CO2 27 08/14/2020   GLUCOSE 100 (H) 08/14/2020    Lab Results  Component Value Date/Time   HGBA1C 5.2 05/18/2019 11:38 AM   HGBA1C 5.4 07/04/2006 10:15 AM   GFR 48.90 (L) 08/14/2020 02:47 PM   GFR 50.76 (L) 05/18/2019 11:38 AM    Last diabetic Eye exam:  No results found for: HMDIABEYEEXA  Last diabetic Foot exam:  No results found for: HMDIABFOOTEX   Lab Results  Component Value Date   CHOL 237 (H) 08/14/2020   HDL 47.40  08/14/2020   LDLCALC 151 (H) 10/20/2016   LDLDIRECT 161.0 08/14/2020   TRIG 347.0 (H) 08/14/2020   CHOLHDL 5 08/14/2020    Hepatic Function Latest Ref Rng & Units 08/14/2020 02/08/2019 10/20/2016  Total Protein 6.0 - 8.3 g/dL 7.4 7.4 7.3  Albumin 3.5 - 5.2 g/dL 4.1 4.3 4.3  AST 0 - 37 U/L '20 21 20  ' ALT 0 - 35 U/L '12 16 14  ' Alk Phosphatase 39 - 117 U/L 42 33(L) 50  Total Bilirubin 0.2 - 1.2 mg/dL 0.4 0.4 0.5   Bilirubin, Direct 0.0 - 0.3 mg/dL - - -    Lab Results  Component Value Date/Time   TSH 2.82 08/14/2020 02:47 PM   TSH 4.03 02/08/2019 01:34 PM   FREET4 0.76 02/08/2019 01:34 PM   FREET4 0.65 10/20/2016 08:53 AM    CBC Latest Ref Rng & Units 08/14/2020 02/08/2019 08/13/2014  WBC 4.0 - 10.5 K/uL 6.8 7.5 6.6  Hemoglobin 12.0 - 15.0 g/dL 12.4 12.6 13.4  Hematocrit 36.0 - 46.0 % 37.0 38.2 40.5  Platelets 150.0 - 400.0 K/uL 191.0 206.0 250.0    Lab Results  Component Value Date/Time   VD25OH 30.01 02/08/2019 01:34 PM   VD25OH 37.20 08/26/2015 11:40 AM    Clinical ASCVD: No  The ASCVD Risk score ([place]., et al., 2013) failed to calculate for the following reasons:   The 2013 ASCVD risk score is only valid for ages 25 to 85    Depression screen PHQ 2/9 12/27/2019 11/20/2018 11/14/2017  Decreased Interest 0 0 0  Down, Depressed, Hopeless 0 1 0  PHQ - 2 Score 0 1 0  Altered sleeping - - 0  Tired, decreased energy - - 0  Change in appetite - - 0  Feeling bad or failure about yourself  - - 0  Trouble concentrating - - 0  Moving slowly or fidgety/restless - - 0  Suicidal thoughts - - 0  PHQ-9 Score - - 0  Difficult doing work/chores - - Not difficult at all   Social History   Tobacco Use  Smoking Status Never  Smokeless Tobacco Never   BP Readings from Last 3 Encounters:  08/14/20 132/70  02/05/20 124/80  12/27/19 136/82   Pulse Readings from Last 3 Encounters:  08/14/20 62  02/05/20 98  12/27/19 65   Wt Readings from Last 3 Encounters:  08/14/20 155 lb 6.4 oz (70.5 kg)  02/05/20 161 lb (73 kg)  12/27/19 160 lb (72.6 kg)   BMI Readings from Last 3 Encounters:  08/14/20 26.67 kg/m  02/05/20 27.64 kg/m  12/27/19 27.46 kg/m    Assessment/Interventions: Review of patient past medical history, allergies, medications, health status, including review of consultants reports, laboratory and other test data, was performed as part of comprehensive evaluation and  provision of chronic care management services.   SDOH:  (Social Determinants of Health) assessments and interventions performed: Yes  SDOH Screenings   Alcohol Screen: Not on file  Depression (PHQ2-9): Low Risk    PHQ-2 Score: 0  Financial Resource Strain: Not on file  Food Insecurity: Not on file  Housing: Not on file  Physical Activity: Not on file  Social Connections: Not on file  Stress: Not on file  Tobacco Use: Low Risk    Smoking Tobacco Use: Never   Smokeless Tobacco Use: Never  Transportation Needs: Not on file    CCM Care Plan  Allergies  Allergen Reactions   Sulfonamide Derivatives     REACTION: angioedema  Medications Reviewed Today     Reviewed by Tomasa Blase, [facility] (Pharmacist) on 12/08/20 at 1630  Med List Status: <None>   Medication Order Taking? Sig Documenting Provider Last Dose Status Informant  acetaminophen (TYLENOL) 500 MG tablet [id number] Yes Take 500 mg by mouth daily as needed for moderate pain. [provider] Taking Active   alendronate (FOSAMAX) 70 MG tablet [id number] No TAKE 1 TABLET BY MOUTH ONCE WEEKLY ON AN EMPTY STOMACH BEFORE BREAKFAST. REMAIN UPRIGHT FOR 30 MINUTES & TAKE WITH 8 OUNCES OF WATER  Patient not taking: Reported on 12/08/2020   Hoyt Koch, MD Not Taking Active   ALPRAZolam [name]) 0.5 MG tablet [id number] Yes TAKE ONE TABLET BY MOUTH TWICE A DAY  Patient taking differently: Take 0.5 mg by mouth at bedtime as needed.   Hoyt Koch, MD Taking Active   b complex vitamins capsule [id number] Yes Take 1 capsule by mouth daily. [provider] Taking Active Self  Cholecalciferol (VITAMIN D3 PO) [id number] Yes Take 2,000 Units by mouth daily. [provider] Taking Active Self  diclofenac sodium (VOLTAREN) 1 % GEL [id number] Yes Apply 2 g topically as needed. [provider] Taking Active Self  diphenhydramine-acetaminophen (TYLENOL PM) 25-500 MG TABS tablet [id number] Yes Take 1  tablet by mouth at bedtime as needed. [provider] Taking Active   gabapentin (NEURONTIN) 100 MG capsule [id number] Yes Take 1 capsule (100 mg total) by mouth at bedtime. Hoyt Koch, MD Taking Active   Glucos-Chond-MSM-Bor-D3-Hyalur (MOVE FREE JOINT HEALTH ADV + D) TABS [id number] Yes Take 1 tablet by mouth daily. [provider] Taking Active   Ibuprofen (ADVIL LIQUI-GELS MINIS) 200 MG CAPS [id number] Yes Take 1 capsule by mouth daily as needed. [provider] Taking Active   ketoconazole (NIZORAL) 2 % shampoo [id number] Yes USE TO SHAMPOO TWICE WEEKLY  Patient taking differently: Apply 1 application topically once a week.   Hoyt Koch, MD Taking Active   levothyroxine (SYNTHROID) 50 MCG tablet [id number] Yes TAKE 1 TABLET BY MOUTH DAILY BEFORE BREAKFAST Hoyt Koch, MD Taking Active   Lidocaine 4 % [facility] [id number] Yes Apply 1 patch topically daily. [provider] Taking Active   lisinopril (ZESTRIL) 20 MG tablet [id number] Yes TAKE ONE TABLET BY MOUTH DAILY Hoyt Koch, MD Taking Active   Multiple Minerals-Vitamins (CALCIUM-MAGNESIUM-ZINC-D3 PO) [id number] Yes Take 3 tablets by mouth. Calcium 1071m , Magnesium 4073m Zing 1541m D3 400 units (in 1 serving)  1 serving = 3 tables [provider] Taking Active   Multiple Vitamins-Minerals (PRESERVISION AREDS 2+MULTI VIT PO) [id number] Take 1 capsule by mouth daily. [provider] Taking Active   Omega-3 Fatty Acids (OMEGA-3 2100 PO) [id number] Take 1 capsule by mouth. [provider] Taking Active   omeprazole (PRILOSEC) 20 MG capsule [id number] TAKE ONE CAPSULE BY MOUTH DAILY ON AN EMPTY STOMACH CraHoyt KochD Taking Active   Polyethyl Glycol-Propyl Glycol 0.4-0.3 % SOLN [id number] Apply 1 drop to eye daily as needed. [provider] Taking Active   POTASSIUM GLUCONATE PO [id number] Take 1 tablet by mouth daily.  [provider] Taking Active   pravastatin (PRAVACHOL) 20 MG tablet [id number] TAKE ONE TABLET BY MOUTH DAILY CraHoyt KochD Taking Active   triamcinolone cream (KENALOG) 0.1 % [id number] Apply 1 application topically 2 (two) times daily.  Patient taking differently: Apply 1 application topically daily.   [place]  Taking Active   vitamin C (ASCORBIC ACID) 500 MG tablet [id number] Yes Take 500 mg by mouth daily. [provider] Taking Active   Vitamin E 400 UNITS TABS [id number] Yes Take 1 tablet by mouth daily. [provider] Taking Active   Xylitol [facility]) [id number] Yes Use as directed 1 tablet in the mouth or throat at bedtime. [provider] Taking Active             Patient Active Problem List   Diagnosis Date Noted   Bilateral hearing loss 08/15/2020   Neuropathy 12/27/2019   Osteoporosis 02/20/2018   Cough 02/20/2018   Rash 01/27/2017   Low back pain 07/06/2016   Right knee pain 07/06/2016   CKD (chronic kidney disease) stage 3, GFR 30-59 ml/min ([place]) 08/13/2014   Routine general medical examination at a health care facility 08/13/2014   Vitamin D deficiency 12/02/2009   AORTIC VALVE DISEASE 01/24/2008   DEGENERATIVE JOINT DISEASE 11/05/2007   Hypothyroidism 10/31/2007   Essential hypertension 10/31/2007   Allergic rhinitis 10/31/2007   Hyperlipidemia 04/12/2007   Anxiety state 04/12/2007   GERD 04/12/2007    Immunization History  Administered Date(s) Administered   Fluad Quad(high Dose 65+) 02/08/2019, 03/13/2020   Influenza Split 03/17/2011, 04/06/2012   Influenza Whole 04/12/2005, 03/27/2008, 03/26/2009, 03/14/2010   Influenza, High Dose Seasonal PF 03/18/2016, 03/10/2017, 06/16/2018   Influenza,inj,Quad PF,6+ Mos 03/22/2013, 03/22/2014, 02/24/2015   PFIZER(Purple Top)SARS-COV-2 Vaccination 07/21/2019, 08/15/2019, 04/26/2020   Pneumococcal Conjugate-13 02/24/2015   Pneumococcal  Polysaccharide-23 07/17/2013    Conditions to be addressed/monitored:  Hypertension, Hyperlipidemia, Hypothyroidism, Osteoporosis, Neuropathy, GERD, CKD, and Insomnia   Care Plan : CCM Care Plan  Updates made by Tomasa Blase, [place] since 12/08/2020 12:00 AM     Problem: HTN, HLD, Osteoporosis, Hypothyroidism, GERD, Insomnia, CKD, Neuropathy   Priority: High  Onset Date: 12/08/2020     Long-Range Goal: Disease Management   Start Date: 12/08/2020  Expected End Date: 06/09/2021  This Visit's Progress: On track  Priority: High  Note:    Current Barriers:  Unable to independently monitor therapeutic efficacy Does not adhere to prescribed medication regimen  Pharmacist Clinical Goal(s):  Patient will achieve adherence to monitoring guidelines and medication adherence to achieve therapeutic efficacy achieve control of LDL  as evidenced by lipid panel maintain control of blood pressure as evidenced by blood pressure log  through collaboration with PharmD and provider.   Interventions: 1:1 collaboration with Hoyt Koch, MD regarding development and update of comprehensive plan of care as evidenced by provider attestation and co-signature Inter-disciplinary care team collaboration (see longitudinal plan of care) Comprehensive medication review performed; medication list updated in electronic medical record  Hypertension (BP goal <140/90) -Controlled -Current treatment: Lisinopril 45m daily  -Medications previously tried: furosemide   -Current office readings: 132/70, 124/80, 136/82 -Current dietary habits: cooks her own food, follows sodium reduced diet  -Current exercise habits: limited but will go out grocery shopping/ walk in house when she can -Denies hypotensive/hypertensive symptoms -Educated on BP goals and benefits of medications for prevention of heart attack, stroke and kidney damage; Daily salt intake goal < 2300 mg; Importance of home blood pressure  monitoring; Proper BP monitoring technique; Symptoms of hypotension and importance of maintaining adequate hydration; -Counseled to monitor BP at home at least once weekly, document, and provide log at future appointments -Counseled on diet and exercise extensively Recommended to continue current medication  Hyperlipidemia: (LDL goal < 100) -Not ideally controlled - Last LDL 161  mg/dL (08/14/2020) -Current treatment: Pravastatin 47m daily  Omega 3 fatty acids - 21068mdaily  -Medications previously tried: fenofibrate, simvastatin   -Current dietary patterns: moderated fried/fatty food intake  -Current exercise habits: limited  -Educated on Cholesterol goals;  Benefits of statin for ASCVD risk reduction; Importance of limiting foods high in cholesterol; -Counseled on diet and exercise extensively Recommended to continue current medication  Insomnia (Goal: promotion of quality sleep ) -Controlled -Current treatment: Alprazolam 0.88m51mightly  Tylenol PM - 1 tablet nightly   -Medications previously tried/failed: n/a -Educated on possible interactions of alprazolam with alcohol - patient does not drink alcohol -Recommended to continue current medication Educated on potential side effects for alprazolam, advised to use the smallest effective dose  Osteoporosis (Goal Prevention of Fractures) -Not ideally controlled -Last DEXA Scan: 11/21/2017   T-Score Right femoral neck: -2.0  T-Score Left femoral neck: -2.4  T-Score lumbar spine: -2.3  10-year probability of major osteoporotic fracture: 14.9%  10-year probability of hip fracture: 5.3% -Patient is a candidate for pharmacologic treatment due to T-Score -1.0 to -2.5 and 10-year risk of hip fracture > 3% -Current treatment  Alendronate 60m63mily (started 11/2017) - not taking at this time  Vitamin D3 2000 units daily  Calcium/Magnesium/ Zinc/VitD3 - 3 tablets daily -total daily dose of Ca 1000mg40m 400mg,60m188mg, 3m00 units  -  Estimated CrCl = 39.248 mL/min - appropriate to continue alendronate  -Medications previously tried: n/a  -Recommend 1200 mg of calcium daily from dietary and supplemental sources. Counseled on oral bisphosphonate administration: take in the morning, 30 minutes prior to food with 6-8 oz of water. Do not lie down for at least 30 minutes after taking. -Counseled on importance of adherence to prescribed medications, patient agreeable to restart alendronate   GERD (Goal: Prevention of stomach acid / flares) -Controlled -Current treatment  Omeprazole 20mg da9m -Medications previously tried: n/a  -Recommended to continue current medication  Neuropathy (Goal: pain control/ prevention of disease progression ) -Controlled -Current treatment  Gabapentin 100mg at 42mime  -Medications previously tried: methocarbamol   -Recommended to continue current medication  Hypothyroidism (Goal: Maintenance of euthyroid levels ) -Controlled - Last TSH level 2.82 uIU/mL (08/14/2020) -Current treatment  Levothyroxine 50mcg dai47m-Medications previously tried: n/a  -Recommended to continue current medication  Chronic Kidney Disease (Goal: Prevention of disease progression) -stable -Last eGFR - 48.90 mL/min  -Recommended for patient to stop use of Advil liquid gels, instead advised for patient to use diclofenac gel and acetaminophen as needed for pain   Health Maintenance -Vaccine gaps: tetanus, shingles, and COVID booster  -Current therapy:  Ketoconazole 2% shampoo - used once weekly  Triamcinolone 0.1% cream - applied once daily  Vitamin B complex - 1 tablet daily  Potassium Gluconate - 1 tablet daily  Diclofenac 1% gel - 2g to affected area up to 4 times daily  APAP 500mg - dai588ms needed for pain Lidocaine 4% patch  - 1 patch daily  Move Free (Glucosamine, Chondroitin, MSM, D3) - 1 tablet daily  Xylitol melts - 1 tablet nightly for dry mouth Systane Dry Eye drops - 1 drop into each eye daily  as needed PreserVision AERDs 2 - 1 tablet daily   Vitamin C 500mg daily 28mamin E 400 units daily  -Educated on Herbal supplement research is limited and benefits usually cannot be proven Cost vs benefit of each product must be carefully weighed by individual consumer Supplements may interfere with prescription drugs -  Patient is satisfied with current therapy and denies issues -Recommended For patient to discontinue Vit E supplement as she is receiving total of 136m daily for AERDS 2 supplement - would also recommend for patient to have Vit D level checked with next PCP visit   Patient Goals/Self-Care Activities Patient will:  - take medications as prescribed check blood pressure at least once weekly, document, and provide at future appointments collaborate with provider on medication access solutions  Follow Up Plan: Telephone follow up appointment with care management team member scheduled for: The patient has been provided with contact information for the care management team and has been advised to call with any health related questions or concerns.     Medication Assistance: None required.  Patient affirms current coverage meets needs.  Compliance/Adherence/Medication fill history: Care Gaps: Tetanus, Shingles, and COVID booster   [facility]:  [id number] [id number] [place]Phone: [phone]: [phone] [id number] [place]: [phone]: [phone] Uses pill box? No - feels she is able to manage without  Pt endorses 90% compliance  Care Plan and Follow Up Patient Decision:  Patient agrees to Care Plan and Follow-up.  Plan: Telephone follow up appointment with care management team member scheduled for:  3 months  and The patient has been provided with contact information for the  care management team and has been advised to call with any health related questions or concerns.   DaTomasa BlasePharmD Clinical Pharmacist, [place]

## 2020-12-08 ENCOUNTER — Ambulatory Visit (INDEPENDENT_AMBULATORY_CARE_PROVIDER_SITE_OTHER): Payer: PPO

## 2020-12-08 ENCOUNTER — Other Ambulatory Visit: Payer: Self-pay

## 2020-12-08 DIAGNOSIS — I10 Essential (primary) hypertension: Secondary | ICD-10-CM

## 2020-12-08 DIAGNOSIS — E039 Hypothyroidism, unspecified: Secondary | ICD-10-CM

## 2020-12-08 DIAGNOSIS — K219 Gastro-esophageal reflux disease without esophagitis: Secondary | ICD-10-CM

## 2020-12-08 DIAGNOSIS — N183 Chronic kidney disease, stage 3 unspecified: Secondary | ICD-10-CM

## 2020-12-08 DIAGNOSIS — E782 Mixed hyperlipidemia: Secondary | ICD-10-CM

## 2020-12-08 DIAGNOSIS — G629 Polyneuropathy, unspecified: Secondary | ICD-10-CM

## 2020-12-08 DIAGNOSIS — M8000XA Age-related osteoporosis with current pathological fracture, unspecified site, initial encounter for fracture: Secondary | ICD-10-CM

## 2020-12-08 NOTE — Patient Instructions (Signed)
Visit Information   PATIENT GOALS:   Goals Addressed             This Visit's Progress    Prevent Falls and Broken Bones-Osteoporosis       Timeframe:  Long-Range Goal Priority:  High Start Date:  12/08/2020                      Expected End Date: 06/09/2021                     Follow Up Date 03/10/2021    - always use handrails on the stairs - always wear shoes or slippers with non-slip sole - get at least 10 minutes of activity every day - keep cell phone with me always - make an emergency alert plan in case I fall - use a cane or walker    Why is this important?   When you fall, there are 3 things that control if a bone breaks or not.  These are the fall itself, how hard and the direction that you fall and how fragile your bones are.  Preventing falls is very important for you because of fragile bones.          Track and Manage My Blood Pressure-Hypertension       Timeframe:  Long-Range Goal Priority:  High Start Date:   12/08/2020                          Expected End Date:   06/09/2021                   Follow Up Date 03/10/2021   - check blood pressure weekly - choose a place to take my blood pressure (home, clinic or office, retail store) - write blood pressure results in a log or diary    Why is this important?   You won't feel high blood pressure, but it can still hurt your blood vessels.  High blood pressure can cause heart or kidney problems. It can also cause a stroke.  Making lifestyle changes like losing a little weight or eating less salt will help.  Checking your blood pressure at home and at different times of the day can help to control blood pressure.  If the doctor prescribes medicine remember to take it the way the doctor ordered.  Call the office if you cannot afford the medicine or if there are questions about it.            Consent to CCM Services: Ms. Janota was given information about Chronic Care Management services today including:   CCM service includes personalized support from designated clinical staff supervised by her physician, including individualized plan of care and coordination with other care providers 24/7 contact phone numbers for assistance for urgent and routine care needs. Service will only be billed when office clinical staff spend 20 minutes or more in a month to coordinate care. Only one practitioner may furnish and bill the service in a calendar month. The patient may stop CCM services at any time (effective at the end of the month) by phone call to the office staff. The patient will be responsible for cost sharing (co-pay) of up to 20% of the service fee (after annual deductible is met).  Patient agreed to services and verbal consent obtained.   Patient verbalizes understanding of instructions provided today and agrees to view in [place].  Telephone follow up appointment with care management team member scheduled for: 3 months  The patient has been provided with contact information for the care management team and has been advised to call with any health related questions or concerns.   Tomasa Blase, PharmD Clinical Pharmacist, [place]   CLINICAL CARE PLAN: Patient Care Plan: CCM Care Plan     Problem Identified: HTN, HLD, Osteoporosis, Hypothyroidism, GERD, Insomnia, CKD, Neuropathy   Priority: High  Onset Date: 12/08/2020     Long-Range Goal: Disease Management   Start Date: 12/08/2020  Expected End Date: 06/09/2021  This Visit's Progress: On track  Priority: High  Note:    Current Barriers:  Unable to independently monitor therapeutic efficacy Does not adhere to prescribed medication regimen  Pharmacist Clinical Goal(s):  Patient will achieve adherence to monitoring guidelines and medication adherence to achieve therapeutic efficacy achieve control of LDL  as evidenced by lipid panel maintain control of blood pressure as evidenced by blood pressure log  through  collaboration with PharmD and provider.   Interventions: 1:1 collaboration with Hoyt Koch, MD regarding development and update of comprehensive plan of care as evidenced by provider attestation and co-signature Inter-disciplinary care team collaboration (see longitudinal plan of care) Comprehensive medication review performed; medication list updated in electronic medical record  Hypertension (BP goal <140/90) -Controlled -Current treatment: Lisinopril 37m daily  -Medications previously tried: furosemide   -Current office readings: 132/70, 124/80, 136/82 -Current dietary habits: cooks her own food, follows sodium reduced diet  -Current exercise habits: limited but will go out grocery shopping/ walk in house when she can -Denies hypotensive/hypertensive symptoms -Educated on BP goals and benefits of medications for prevention of heart attack, stroke and kidney damage; Daily salt intake goal < 2300 mg; Importance of home blood pressure monitoring; Proper BP monitoring technique; Symptoms of hypotension and importance of maintaining adequate hydration; -Counseled to monitor BP at home at least once weekly, document, and provide log at future appointments -Counseled on diet and exercise extensively Recommended to continue current medication  Hyperlipidemia: (LDL goal < 100) -Not ideally controlled - Last LDL 161 mg/dL (08/14/2020) -Current treatment: Pravastatin 253mdaily  Omega 3 fatty acids - 210079maily  -Medications previously tried: fenofibrate, simvastatin   -Current dietary patterns: moderated fried/fatty food intake  -Current exercise habits: limited  -Educated on Cholesterol goals;  Benefits of statin for ASCVD risk reduction; Importance of limiting foods high in cholesterol; -Counseled on diet and exercise extensively Recommended to continue current medication  Insomnia (Goal: promotion of quality sleep ) -Controlled -Current treatment: Alprazolam 0.5mg68mightly  Tylenol PM - 1 tablet nightly   -Medications previously tried/failed: n/a -Educated on possible interactions of alprazolam with alcohol - patient does not drink alcohol -Recommended to continue current medication Educated on potential side effects for alprazolam, advised to use the smallest effective dose  Osteoporosis (Goal Prevention of Fractures) -Not ideally controlled -Last DEXA Scan: 11/21/2017   T-Score Right femoral neck: -2.0  T-Score Left femoral neck: -2.4  T-Score lumbar spine: -2.3  10-year probability of major osteoporotic fracture: 14.9%  10-year probability of hip fracture: 5.3% -Patient is a candidate for pharmacologic treatment due to T-Score -1.0 to -2.5 and 10-year risk of hip fracture > 3% -Current treatment  Alendronate 70mg78mly (started 11/2017) - not taking at this time  Vitamin D3 2000 units daily  Calcium/Magnesium/ Zinc/VitD3 - 3 tablets daily -total daily dose of Ca 1000mg,75m400mg, 26m5mg, D108m0 units  -  Estimated CrCl = 39.248 mL/min - appropriate to continue alendronate  -Medications previously tried: n/a  -Recommend 1200 mg of calcium daily from dietary and supplemental sources. Counseled on oral bisphosphonate administration: take in the morning, 30 minutes prior to food with 6-8 oz of water. Do not lie down for at least 30 minutes after taking. -Counseled on importance of adherence to prescribed medications, patient agreeable to restart alendronate   GERD (Goal: Prevention of stomach acid / flares) -Controlled -Current treatment  Omeprazole 35m daily  -Medications previously tried: n/a  -Recommended to continue current medication  Neuropathy (Goal: pain control/ prevention of disease progression ) -Controlled -Current treatment  Gabapentin 1064mat bedtime  -Medications previously tried: methocarbamol   -Recommended to continue current medication  Hypothyroidism (Goal: Maintenance of euthyroid levels ) -Controlled - Last TSH  level 2.82 uIU/mL (08/14/2020) -Current treatment  Levothyroxine 5061mdaily  -Medications previously tried: n/a  -Recommended to continue current medication  Chronic Kidney Disease (Goal: Prevention of disease progression) -stable -Last eGFR - 48.90 mL/min  -Recommended for patient to stop use of Advil liquid gels, instead advised for patient to use diclofenac gel and acetaminophen as needed for pain   Health Maintenance -Vaccine gaps: tetanus, shingles, and COVID booster  -Current therapy:  Ketoconazole 2% shampoo - used once weekly  Triamcinolone 0.1% cream - applied once daily  Vitamin B complex - 1 tablet daily  Potassium Gluconate - 1 tablet daily  Diclofenac 1% gel - 2g to affected area up to 4 times daily  APAP 500m1mdaily as needed for pain Lidocaine 4% patch  - 1 patch daily  Move Free (Glucosamine, Chondroitin, MSM, D3) - 1 tablet daily  Xylitol melts - 1 tablet nightly for dry mouth Systane Dry Eye drops - 1 drop into each eye daily as needed PreserVision AERDs 2 - 1 tablet daily   Vitamin C 500mg69mly  Vitamin E 400 units daily  -Educated on Herbal supplement research is limited and benefits usually cannot be proven Cost vs benefit of each product must be carefully weighed by individual consumer Supplements may interfere with prescription drugs -Patient is satisfied with current therapy and denies issues -Recommended For patient to discontinue Vit E supplement as she is receiving total of 180mg 21my for AERDS 2 supplement - would also recommend for patient to have Vit D level checked with next PCP visit   Patient Goals/Self-Care Activities Patient will:  - take medications as prescribed check blood pressure at least once weekly, document, and provide at future appointments collaborate with provider on medication access solutions  Follow Up Plan: Telephone follow up appointment with care management team member scheduled for: The patient has been provided with  contact information for the care management team and has been advised to call with any health related questions or concerns.

## 2020-12-12 ENCOUNTER — Other Ambulatory Visit: Payer: Self-pay | Admitting: Internal Medicine

## 2021-01-22 ENCOUNTER — Other Ambulatory Visit: Payer: Self-pay | Admitting: Internal Medicine

## 2021-02-13 ENCOUNTER — Ambulatory Visit (INDEPENDENT_AMBULATORY_CARE_PROVIDER_SITE_OTHER): Payer: PPO

## 2021-02-13 VITALS — BP 140/69 | HR 75 | Ht 64.0 in | Wt 157.0 lb

## 2021-02-13 DIAGNOSIS — Z0000 Encounter for general adult medical examination without abnormal findings: Secondary | ICD-10-CM

## 2021-02-13 NOTE — Progress Notes (Addendum)
I connected with Deanna Olson today by telephone and verified that I am speaking with the correct person using two identifiers. Location patient: home Location provider: work Persons participating in the virtual visit: patient, provider.   I discussed the limitations, risks, security and privacy concerns of performing an evaluation and management service by telephone and the availability of in person appointments. I also discussed with the patient that there may be a patient responsible charge related to this service. The patient expressed understanding and verbally consented to this telephonic visit.    Interactive audio and video telecommunications were attempted between this provider and patient, however failed, due to patient having technical difficulties OR patient did not have access to video capability.  We continued and completed visit with audio only.  Some vital signs may be absent or patient reported.   Time Spent with patient on telephone encounter: 30 minutes  Subjective:   Deanna Olson is a 87 y.o. female who presents for Medicare Annual (Subsequent) preventive examination.  Review of Systems     Cardiac Risk Factors include: advanced age (>64mn, >>6women);dyslipidemia;hypertension;family history of premature cardiovascular disease     Objective:    Today's Vitals   02/13/21 0912 02/13/21 0913  BP: 135/63 140/69  Pulse: 80 75  Weight: 157 lb (71.2 kg)   Height: '5\' 4"'$  (1.626 m)    Body mass index is 26.95 kg/m.  Advanced Directives 02/13/2021 11/20/2018 11/14/2017 08/26/2015  Does Patient Have a Medical Advance Directive? No Yes Yes Yes  Type of Advance Directive - [place] will [place] will -  Does patient want to make changes to medical advance directive? No - Patient declined - - -  Copy of [place]? - No - copy requested No - copy requested Yes    Current Medications  (verified) Outpatient Encounter Medications as of 02/13/2021  Medication Sig   acetaminophen (TYLENOL) 500 MG tablet Take 500 mg by mouth daily as needed for moderate pain.   alendronate (FOSAMAX) 70 MG tablet TAKE 1 TABLET BY MOUTH ONCE WEEKLY ON AN EMPTY STOMACH BEFORE BREAKFAST. REMAIN UPRIGHT FOR 30 MINUTES & TAKE WITH 8 OUNCES OF WATER   ALPRAZolam (XANAX) 0.5 MG tablet TAKE ONE TABLET BY MOUTH TWICE A DAY (Patient taking differently: Take 0.5 mg by mouth at bedtime as needed.)   b complex vitamins capsule Take 1 capsule by mouth daily.   Cholecalciferol (VITAMIN D3 PO) Take 2,000 Units by mouth daily.   diclofenac sodium (VOLTAREN) 1 % GEL Apply 2 g topically as needed.   diphenhydramine-acetaminophen (TYLENOL PM) 25-500 MG TABS tablet Take 1 tablet by mouth at bedtime as needed.   gabapentin (NEURONTIN) 100 MG capsule Take 1 capsule (100 mg total) by mouth at bedtime.   Glucos-Chond-MSM-Bor-D3-Hyalur (MOVE FREE JOINT HEALTH ADV + D) TABS Take 1 tablet by mouth daily.   Ibuprofen 200 MG CAPS Take 1 capsule by mouth daily as needed.   ketoconazole (NIZORAL) 2 % shampoo USE TO SHAMPOO TWICE WEEKLY (Patient taking differently: Apply 1 application topically once a week.)   levothyroxine (SYNTHROID) 50 MCG tablet TAKE 1 TABLET BY MOUTH DAILY BEFORE BREAKFAST   Lidocaine 4 % PTCH Apply 1 patch topically daily.   lisinopril (ZESTRIL) 20 MG tablet TAKE ONE TABLET BY MOUTH DAILY   Multiple Minerals-Vitamins (CALCIUM-MAGNESIUM-ZINC-D3 PO) Take 3 tablets by mouth. Calcium '1000mg'$  , Magnesium '400mg'$ , Zing '15mg'$  , D3 400 units (in 1 serving)  1 serving =  3 tables   Multiple Vitamins-Minerals (PRESERVISION AREDS 2+MULTI VIT PO) Take 1 capsule by mouth daily.   Omega-3 Fatty Acids (OMEGA-3 2100 PO) Take 1 capsule by mouth.   omeprazole (PRILOSEC) 20 MG capsule TAKE ONE CAPSULE BY MOUTH DAILY ON AN EMPTY STOMACH   Polyethyl Glycol-Propyl Glycol 0.4-0.3 % SOLN Apply 1 drop to eye daily as needed.   POTASSIUM  GLUCONATE PO Take 1 tablet by mouth daily.   pravastatin (PRAVACHOL) 20 MG tablet TAKE ONE TABLET BY MOUTH DAILY   triamcinolone cream (KENALOG) 0.1 % Apply 1 application topically daily.   vitamin C (ASCORBIC ACID) 500 MG tablet Take 500 mg by mouth daily.   Xylitol (XYLIMELTS MT) Use as directed 1 tablet in the mouth or throat at bedtime.   No facility-administered encounter medications on file as of 02/13/2021.    Allergies (verified) Sulfonamide derivatives   History: Past Medical History:  Diagnosis Date   Allergic rhinitis, cause unspecified    Anxiety state, unspecified    Aortic valve disorders    Mild AS 2013 echo    Eczema    Esophageal reflux    Herpes zoster without mention of complication    Hyperlipidemia    Hypertension    Hypothyroid    Osteoarthrosis, unspecified whether generalized or localized, unspecified site    Unspecified venous (peripheral) insufficiency    Past Surgical History:  Procedure Laterality Date   CATARACT EXTRACTION, BILATERAL  2013   KYPHOPLASTY  02/2018   VESICOVAGINAL FISTULA CLOSURE W/ TAH     Family History  Problem Relation Age of Onset   Stroke Mother 36   Diabetes Mother    Diabetes Brother    Heart attack Brother 69       s/p open heart surg   CAD Brother 31   Cancer Sister        4 types   Social History   Socioeconomic History   Marital status: Widowed    Spouse name: (wilbur Kyne)   Number of children: 2   Years of education: Not on file   Highest education level: Not on file  Occupational History   Occupation: retired  Tobacco Use   Smoking status: Never   Smokeless tobacco: Never  Vaping Use   Vaping Use: Never used  Substance and Sexual Activity   Alcohol use: Never    Alcohol/week: 0.0 standard drinks   Drug use: Never   Sexual activity: Never  Other Topics Concern   Not on file  Social History Narrative   Not on file   Social Determinants of Health   Financial Resource Strain: Low Risk     Difficulty of Paying Living Expenses: Not hard at all  Food Insecurity: No Food Insecurity   Worried About Charity fundraiser in the Last Year: Never true   [place] in the Last Year: Never true  Transportation Needs: No Transportation Needs   Lack of Transportation (Medical): No   Lack of Transportation (Non-Medical): No  Physical Activity: Sufficiently Active   Days of Exercise per Week: 5 days   Minutes of Exercise per Session: 30 min  Stress: No Stress Concern Present   Feeling of Stress : Not at all  Social Connections: Moderately Integrated   Frequency of Communication with Friends and Family: More than three times a week   Frequency of Social Gatherings with Friends and Family: More than three times a week   Attends Religious Services: More than 4 times per  year   Active Member of Clubs or Organizations: Yes   Attends Archivist Meetings: More than 4 times per year   Marital Status: Widowed    Tobacco Counseling Counseling given: Not Answered   Clinical Intake:  Pre-visit preparation completed: Yes  Pain : No/denies pain     Nutritional Risks: None Diabetes: No  How often do you need to have someone help you when you read instructions, pamphlets, or other written materials from your doctor or pharmacy?: 1 - Never What is the last grade level you completed in school?: High School Graduate  Diabetic? no  Interpreter Needed?: No  Information entered by :: Lisette Abu, LPN   Activities of Daily Living In your present state of health, do you have any difficulty performing the following activities: 02/13/2021 08/14/2020  Hearing? Y N  Vision? N N  Difficulty concentrating or making decisions? N N  Walking or climbing stairs? Y N  Dressing or bathing? N N  Doing errands, shopping? Y N  Preparing Food and eating ? N -  Using the Toilet? N -  In the past six months, have you accidently leaked urine? Y -  Comment wears protection -  Do you  have problems with loss of bowel control? N -  Managing your Medications? N -  Managing your Finances? N -  Housekeeping or managing your Housekeeping? N -  Some recent data might be hidden    Patient Care Team: Hoyt Koch, MD as PCP - General (Internal Medicine) Minus Breeding, MD as PCP - Cardiology (Cardiology) Druscilla Brownie, MD (Dermatology) Shon Hough, MD (Ophthalmology) Delice Bison Darnelle Maffucci, [facility] as Pharmacist (Pharmacist)  Indicate any recent Medical Services you may have received from other than [facility] providers in the past year (date may be approximate).     Assessment:   This is a routine wellness examination for Deanna Olson.  Hearing/Vision screen Hearing Screening - Comments:: Patient has decreased hearing. Waiting to fine cheaper hearing aids. Vision Screening - Comments:: Patient wears glasses. Annual eye exam done in October 2021 with Dr. Shon Hough.  Dietary issues and exercise activities discussed: Current Exercise Habits: Home exercise routine, Type of exercise: walking, Time (Minutes): 30, Frequency (Times/Week): 5, Weekly Exercise (Minutes/Week): 150, Intensity: Mild, Exercise limited by: orthopedic condition(s)   Goals Addressed   None   Depression Screen PHQ 2/9 Scores 02/13/2021 12/27/2019 11/20/2018 11/14/2017 10/20/2016 08/26/2015 04/30/2015  PHQ - 2 Score 0 0 1 0 0 0 0  PHQ- 9 Score - - - 0 0 - -    Fall Risk Fall Risk  02/13/2021 11/16/2019 11/20/2018 11/14/2017 03/10/2017  Falls in the past year? 0 1 0 No No  Number falls in past yr: 0 0 0 - -  Injury with Fall? 0 0 - - -  Risk for fall due to : No Fall Risks - Impaired mobility - -  Follow up Falls evaluation completed - - - -    FALL RISK PREVENTION PERTAINING TO THE HOME:  Any stairs in or around the home? No  If so, are there any without handrails? No  Home free of loose throw rugs in walkways, pet beds, electrical cords, etc? Yes  Adequate lighting in your home to reduce risk of falls?  Yes   ASSISTIVE DEVICES UTILIZED TO PREVENT FALLS:  Life alert? Yes  Use of a cane, walker or w/c? Yes  Grab bars in the bathroom? Yes  Shower chair or bench in shower? Yes  Elevated toilet  seat or a handicapped toilet? Yes   TIMED UP AND GO:  Was the test performed? No .  Length of time to ambulate 10 feet: n/a sec.   Gait slow and steady with assistive device  Cognitive Function: Normal cognitive status assessed by direct observation by this Nurse Health Advisor. No abnormalities found.   MMSE - Mini Mental State Exam 11/14/2017 08/26/2015  Not completed: - (No Data)  Orientation to time 5 -  Orientation to Place 5 -  Registration 3 -  Attention/ Calculation 5 -  Recall 3 -  Language- name 2 objects 2 -  Language- repeat 1 -  Language- follow 3 step command 3 -  Language- read & follow direction 1 -  Write a sentence 1 -  Copy design 1 -  Total score 30 -        Immunizations Immunization History  Administered Date(s) Administered   Fluad Quad(high Dose 65+) 02/08/2019, 03/13/2020   Influenza Split 03/17/2011, 04/06/2012   Influenza Whole 04/12/2005, 03/27/2008, 03/26/2009, 03/14/2010   Influenza, High Dose Seasonal PF 03/18/2016, 03/10/2017, 06/16/2018   Influenza,inj,Quad PF,6+ Mos 03/22/2013, 03/22/2014, 02/24/2015   PFIZER(Purple Top)SARS-COV-2 Vaccination 07/21/2019, 08/15/2019, 04/26/2020   Pneumococcal Conjugate-13 02/24/2015   Pneumococcal Polysaccharide-23 07/17/2013    TDAP status: Due, Education has been provided regarding the importance of this vaccine. Advised may receive this vaccine at local pharmacy or Health Dept. Aware to provide a copy of the vaccination record if obtained from local pharmacy or Health Dept. Verbalized acceptance and understanding. (Declined)  Flu Vaccine status: Up to date  Pneumococcal vaccine status: Up to date  Covid-19 vaccine status: Completed vaccines  Qualifies for Shingles Vaccine? Yes   Zostavax completed No    Shingrix Completed?: No.    Education has been provided regarding the importance of this vaccine. Patient has been advised to call insurance company to determine out of pocket expense if they have not yet received this vaccine. Advised may also receive vaccine at local pharmacy or Health Dept. Verbalized acceptance and understanding.  Screening Tests Health Maintenance  Topic Date Due   TETANUS/TDAP  Never done   Zoster Vaccines- Shingrix (1 of 2) Never done   COVID-19 Vaccine (4 - Booster for [employer] series) 08/24/2020   INFLUENZA VACCINE  01/12/2021   DEXA SCAN  Completed   PNA vac Low Risk Adult  Completed   HPV VACCINES  Aged Out    Health Maintenance  Health Maintenance Due  Topic Date Due   TETANUS/TDAP  Never done   Zoster Vaccines- Shingrix (1 of 2) Never done   COVID-19 Vaccine (4 - Booster for [employer] series) 08/24/2020   INFLUENZA VACCINE  01/12/2021    Colorectal cancer screening: No longer required.   Mammogram status: No longer required due to age.  Bone Density status: Completed 11/21/2017. Results reflect: Bone density results: OSTEOPENIA. Repeat every 2-3 years.  Lung Cancer Screening: (Low Dose CT Chest recommended if Age 9-80 years, 30 pack-year currently smoking OR have quit w/in 15years.) does not qualify.   Lung Cancer Screening Referral: no  Additional Screening:  Hepatitis C Screening: does not qualify; Completed no  Vision Screening: Recommended annual ophthalmology exams for early detection of glaucoma and other disorders of the eye. Is the patient up to date with their annual eye exam?  Yes  Who is the provider or what is the name of the office in which the patient attends annual eye exams? Shon Hough, MD If pt is not established with a  provider, would they like to be referred to a provider to establish care? No .   Dental Screening: Recommended annual dental exams for proper oral hygiene  Community Resource Referral / Chronic Care  Management: CRR required this visit?  No   CCM required this visit?  No      Plan:     I have personally reviewed and noted the following in the patient's chart:   Medical and social history Use of alcohol, tobacco or illicit drugs  Current medications and supplements including opioid prescriptions.  Functional ability and status Nutritional status Physical activity Advanced directives List of other physicians Hospitalizations, surgeries, and ER visits in previous 12 months Vitals Screenings to include cognitive, depression, and falls Referrals and appointments  In addition, I have reviewed and discussed with patient certain preventive protocols, quality metrics, and best practice recommendations. A written personalized care plan for preventive services as well as general preventive health recommendations were provided to patient.     Sheral Flow, LPN   [id number]   Nurse Notes:  Patient provided vital signs. Hearing Screening - Comments:: Patient has decreased hearing. Waiting to fine cheaper hearing aids. Vision Screening - Comments:: Patient wears glasses. Annual eye exam done in October 2021 with Dr. Shon Hough.

## 2021-02-13 NOTE — Patient Instructions (Signed)
Deanna Olson , Thank you for taking time to come for your Medicare Wellness Visit. I appreciate your ongoing commitment to your health goals. Please review the following plan we discussed and let me know if I can assist you in the future.   Screening recommendations/referrals: Colonoscopy: not a candidate for screening due to age 87: not a candidate for screening due to age Bone Density: 11/21/2017; due every 2-3 years Recommended yearly ophthalmology/optometry visit for glaucoma screening and checkup Recommended yearly dental visit for hygiene and checkup  Vaccinations: Influenza vaccine: 03/13/2020 Pneumococcal vaccine: 07/17/2013, 02/24/2015 Tdap vaccine: never done Shingles vaccine: never done   Covid-19: 07/21/2019, 08/15/2019, 04/26/2020  Advanced directives: Advance directive discussed with you today. Even though you declined this today please call our office should you change your mind and we can give you the proper paperwork for you to fill out.  Conditions/risks identified: Yes; Client understands the importance of follow-up with providers by attending scheduled visits and discussed goals to eat healthier, increase physical activity, exercise the brain, socialize more, get enough sleep and make time for laughter.  Next appointment: Please schedule your next Medicare Wellness Visit with your Nurse Health Advisor in 1 year by calling [phone].   Preventive Care 52 Years and Older, Female Preventive care refers to lifestyle choices and visits with your health care provider that can promote health and wellness. What does preventive care include? A yearly physical exam. This is also called an annual well check. Dental exams once or twice a year. Routine eye exams. Ask your health care provider how often you should have your eyes checked. Personal lifestyle choices, including: Daily care of your teeth and gums. Regular physical activity. Eating a healthy diet. Avoiding tobacco  and drug use. Limiting alcohol use. Practicing safe sex. Taking low-dose aspirin every day. Taking vitamin and mineral supplements as recommended by your health care provider. What happens during an annual well check? The services and screenings done by your health care provider during your annual well check will depend on your age, overall health, lifestyle risk factors, and family history of disease. Counseling  Your health care provider may ask you questions about your: Alcohol use. Tobacco use. Drug use. Emotional well-being. Home and relationship well-being. Sexual activity. Eating habits. History of falls. Memory and ability to understand (cognition). Work and work Statistician. Reproductive health. Screening  You may have the following tests or measurements: Height, weight, and BMI. Blood pressure. Lipid and cholesterol levels. These may be checked every 5 years, or more frequently if you are over 84 years old. Skin check. Lung cancer screening. You may have this screening every year starting at age 76 if you have a 30-pack-year history of smoking and currently smoke or have quit within the past 15 years. Fecal occult blood test (FOBT) of the stool. You may have this test every year starting at age 67. Flexible sigmoidoscopy or colonoscopy. You may have a sigmoidoscopy every 5 years or a colonoscopy every 10 years starting at age 4. Hepatitis C blood test. Hepatitis B blood test. Sexually transmitted disease (STD) testing. Diabetes screening. This is done by checking your blood sugar (glucose) after you have not eaten for a while (fasting). You may have this done every 1-3 years. Bone density scan. This is done to screen for osteoporosis. You may have this done starting at age 21. Mammogram. This may be done every 1-2 years. Talk to your health care provider about how often you should have regular mammograms. Talk with  your health care provider about your test results,  treatment options, and if necessary, the need for more tests. Vaccines  Your health care provider may recommend certain vaccines, such as: Influenza vaccine. This is recommended every year. Tetanus, diphtheria, and acellular pertussis (Tdap, Td) vaccine. You may need a Td booster every 10 years. Zoster vaccine. You may need this after age 37. Pneumococcal 13-valent conjugate (PCV13) vaccine. One dose is recommended after age 28. Pneumococcal polysaccharide (PPSV23) vaccine. One dose is recommended after age 36. Talk to your health care provider about which screenings and vaccines you need and how often you need them. This information is not intended to replace advice given to you by your health care provider. Make sure you discuss any questions you have with your health care provider. Document Released: 06/27/2015 Document Revised: 02/18/2016 Document Reviewed: 04/01/2015 Elsevier Interactive Patient Education  2017 [employer] Prevention in the Home Falls can cause injuries. They can happen to people of all ages. There are many things you can do to make your home safe and to help prevent falls. What can I do on the outside of my home? Regularly fix the edges of walkways and driveways and fix any cracks. Remove anything that might make you trip as you walk through a door, such as a raised step or threshold. Trim any bushes or trees on the path to your home. Use bright outdoor lighting. Clear any walking paths of anything that might make someone trip, such as rocks or tools. Regularly check to see if handrails are loose or broken. Make sure that both sides of any steps have handrails. Any raised decks and porches should have guardrails on the edges. Have any leaves, snow, or ice cleared regularly. Use sand or salt on walking paths during winter. Clean up any spills in your garage right away. This includes oil or grease spills. What can I do in the bathroom? Use night  lights. Install grab bars by the toilet and in the tub and shower. Do not use towel bars as grab bars. Use non-skid mats or decals in the tub or shower. If you need to sit down in the shower, use a plastic, non-slip stool. Keep the floor dry. Clean up any water that spills on the floor as soon as it happens. Remove soap buildup in the tub or shower regularly. Attach bath mats securely with double-sided non-slip rug tape. Do not have throw rugs and other things on the floor that can make you trip. What can I do in the bedroom? Use night lights. Make sure that you have a light by your bed that is easy to reach. Do not use any sheets or blankets that are too big for your bed. They should not hang down onto the floor. Have a firm chair that has side arms. You can use this for support while you get dressed. Do not have throw rugs and other things on the floor that can make you trip. What can I do in the kitchen? Clean up any spills right away. Avoid walking on wet floors. Keep items that you use a lot in easy-to-reach places. If you need to reach something above you, use a strong step stool that has a grab bar. Keep electrical cords out of the way. Do not use floor polish or wax that makes floors slippery. If you must use wax, use non-skid floor wax. Do not have throw rugs and other things on the floor that can make you trip.  What can I do with my stairs? Do not leave any items on the stairs. Make sure that there are handrails on both sides of the stairs and use them. Fix handrails that are broken or loose. Make sure that handrails are as long as the stairways. Check any carpeting to make sure that it is firmly attached to the stairs. Fix any carpet that is loose or worn. Avoid having throw rugs at the top or bottom of the stairs. If you do have throw rugs, attach them to the floor with carpet tape. Make sure that you have a light switch at the top of the stairs and the bottom of the stairs. If  you do not have them, ask someone to add them for you. What else can I do to help prevent falls? Wear shoes that: Do not have high heels. Have rubber bottoms. Are comfortable and fit you well. Are closed at the toe. Do not wear sandals. If you use a stepladder: Make sure that it is fully opened. Do not climb a closed stepladder. Make sure that both sides of the stepladder are locked into place. Ask someone to hold it for you, if possible. Clearly mark and make sure that you can see: Any grab bars or handrails. First and last steps. Where the edge of each step is. Use tools that help you move around (mobility aids) if they are needed. These include: Canes. Walkers. Scooters. Crutches. Turn on the lights when you go into a dark area. Replace any light bulbs as soon as they burn out. Set up your furniture so you have a clear path. Avoid moving your furniture around. If any of your floors are uneven, fix them. If there are any pets around you, be aware of where they are. Review your medicines with your doctor. Some medicines can make you feel dizzy. This can increase your chance of falling. Ask your doctor what other things that you can do to help prevent falls. This information is not intended to replace advice given to you by your health care provider. Make sure you discuss any questions you have with your health care provider. Document Released: 03/27/2009 Document Revised: 11/06/2015 Document Reviewed: 07/05/2014 Elsevier Interactive Patient Education  2017 [employer].

## 2021-02-19 ENCOUNTER — Other Ambulatory Visit: Payer: Self-pay | Admitting: Internal Medicine

## 2021-03-03 ENCOUNTER — Other Ambulatory Visit: Payer: Self-pay | Admitting: Internal Medicine

## 2021-03-05 ENCOUNTER — Other Ambulatory Visit: Payer: Self-pay | Admitting: Internal Medicine

## 2021-03-05 NOTE — Telephone Encounter (Signed)
Patient demanding refill  now Gabapentin  Please call

## 2021-03-18 ENCOUNTER — Other Ambulatory Visit: Payer: Self-pay | Admitting: Internal Medicine

## 2021-03-28 ENCOUNTER — Other Ambulatory Visit: Payer: Self-pay | Admitting: Internal Medicine

## 2021-04-13 ENCOUNTER — Other Ambulatory Visit: Payer: Self-pay | Admitting: Internal Medicine

## 2021-04-20 DIAGNOSIS — H353132 Nonexudative age-related macular degeneration, bilateral, intermediate dry stage: Secondary | ICD-10-CM | POA: Diagnosis not present

## 2021-04-20 DIAGNOSIS — H52203 Unspecified astigmatism, bilateral: Secondary | ICD-10-CM | POA: Diagnosis not present

## 2021-04-20 DIAGNOSIS — Z961 Presence of intraocular lens: Secondary | ICD-10-CM | POA: Diagnosis not present

## 2021-04-20 DIAGNOSIS — H524 Presbyopia: Secondary | ICD-10-CM | POA: Diagnosis not present

## 2021-04-30 ENCOUNTER — Other Ambulatory Visit: Payer: Self-pay | Admitting: Internal Medicine

## 2021-05-10 ENCOUNTER — Other Ambulatory Visit: Payer: Self-pay | Admitting: Internal Medicine

## 2021-05-11 ENCOUNTER — Other Ambulatory Visit: Payer: Self-pay

## 2021-05-11 ENCOUNTER — Ambulatory Visit (INDEPENDENT_AMBULATORY_CARE_PROVIDER_SITE_OTHER): Payer: PPO

## 2021-05-11 DIAGNOSIS — I10 Essential (primary) hypertension: Secondary | ICD-10-CM

## 2021-05-11 DIAGNOSIS — E782 Mixed hyperlipidemia: Secondary | ICD-10-CM

## 2021-05-11 DIAGNOSIS — G629 Polyneuropathy, unspecified: Secondary | ICD-10-CM

## 2021-05-11 NOTE — Patient Instructions (Signed)
Visit Information  Following are the goals we discussed today:   Track and Manage My Blood Pressure - HTN  Timeframe:  Long-Range Goal Priority:  High Start Date:   12/08/2020                          Expected End Date:   06/09/2022                   Follow Up Date 06/22/2021   - check blood pressure weekly - choose a place to take my blood pressure (home, clinic or office, retail store) - write blood pressure results in a log or diary    Why is this important?   You won't feel high blood pressure, but it can still hurt your blood vessels.  High blood pressure can cause heart or kidney problems. It can also cause a stroke.  Making lifestyle changes like losing a little weight or eating less salt will help.  Checking your blood pressure at home and at different times of the day can help to control blood pressure.  If the doctor prescribes medicine remember to take it the way the doctor ordered.  Call the office if you cannot afford the medicine or if there are questions about it.     Prevent Falls / Broken Bones - Osteoporosis   Timeframe:  Long-Range Goal Priority:  High Start Date:   12/08/2020                          Expected End Date:   06/09/2022                   Follow Up Date 06/22/2021   - always use handrails on the stairs - always wear shoes or slippers with non-slip sole - get at least 10 minutes of activity every day - keep cell phone with me always - make an emergency alert plan in case I fall - use a cane or walker    Why is this important?   When you fall, there are 3 things that control if a bone breaks or not.  These are the fall itself, how hard and the direction that you fall and how fragile your bones are.  Preventing falls is very important for you because of fragile bones.      Plan: Telephone follow up appointment with care management team member scheduled for:  6 weeks  The patient has been provided with contact information for the care management  team and has been advised to call with any health related questions or concerns.   Tomasa Blase, PharmD Clinical Pharmacist, [facility]   Please call the care guide team at [phone] if you need to cancel or reschedule your appointment.   Patient verbalizes understanding of instructions provided today and agrees to view in [place].

## 2021-05-11 NOTE — Progress Notes (Signed)
Chronic Care Management Pharmacy Note  05/11/2021 Name:  Deanna Olson MRN:  [id number] DOB:  July 14, 1925  Summary: -Patient confirms that she has restarted alendronate since last visit, denies any issues since restarting  -Reports to worsening numbness / tingling in her legs from her knees to her feet  -Has not been monitoring BP at home, will have daughter check BP when she is over to visit and record   Recommendations/Changes made from today's visit: -Recommended for patient to increase gabapentin dosage due to worsening neuropathy  - advised for patient to increase to 239m nightly for 1 week, should pain persist she can increase up to 3081mnightly  -Advised for patient to increase use of voltaren gel  - up to 4 times daily as needed  -Patient to record blood pressures when daughter checks - to call office soul BP be >140/90  Subjective: DoJayleen Scagliones an [age].o. year old female who is a primary patient of CrHoyt KochMD.  The CCM team was consulted for assistance with disease management and care coordination needs.    Engaged with patient by telephone for initial visit in response to provider referral for pharmacy case management and/or care coordination services.   Consent to Services:  The patient was given the following information about Chronic Care Management services today, agreed to services, and gave verbal consent: 1. CCM service includes personalized support from designated clinical staff supervised by the primary care provider, including individualized plan of care and coordination with other care providers 2. 24/7 contact phone numbers for assistance for urgent and routine care needs. 3. Service will only be billed when office clinical staff spend 20 minutes or more in a month to coordinate care. 4. Only one practitioner may furnish and bill the service in a calendar month. 5.The patient may stop CCM services at any time (effective at the end of the month) by phone  call to the office staff. 6. The patient will be responsible for cost sharing (co-pay) of up to 20% of the service fee (after annual deductible is met). Patient agreed to services and consent obtained.  Patient Care Team: CrHoyt KochMD as PCP - General (Internal Medicine) HoMinus BreedingMD as PCP - Cardiology (Cardiology) LuDruscilla BrownieMD (Dermatology) StShon HoughMD (Ophthalmology) SzDelice BisonaDarnelle MaffucciRPPhysicians [facility] Pharmacist (Pharmacist)  Recent office visits: No changes since last visit   Recent consult visits: No changes since last visit   Hospital visits: None in previous 6 months  Objective:  Lab Results  Component Value Date   CREATININE 1.00 08/14/2020   BUN 26 (H) 08/14/2020   GFR 48.90 (L) 08/14/2020   GFRNONAA 45.69 04/03/2010   GFRAA 69 07/29/2008   NA 138 08/14/2020   K 4.9 08/14/2020   CALCIUM 9.8 08/14/2020   CO2 27 08/14/2020   GLUCOSE 100 (H) 08/14/2020    Lab Results  Component Value Date/Time   HGBA1C 5.2 05/18/2019 11:38 AM   HGBA1C 5.4 07/04/2006 10:15 AM   GFR 48.90 (L) 08/14/2020 02:47 PM   GFR 50.76 (L) 05/18/2019 11:38 AM    Last diabetic Eye exam:  No results found for: HMDIABEYEEXA  Last diabetic Foot exam:  No results found for: HMDIABFOOTEX   Lab Results  Component Value Date   CHOL 237 (H) 08/14/2020   HDL 47.40 08/14/2020   LDLCALC 151 (H) 10/20/2016   LDLDIRECT 161.0 08/14/2020   TRIG 347.0 (H) 08/14/2020   CHOLHDL 5 08/14/2020    Hepatic Function  Latest Ref Rng & Units 08/14/2020 02/08/2019 10/20/2016  Total Protein 6.0 - 8.3 g/dL 7.4 7.4 7.3  Albumin 3.5 - 5.2 g/dL 4.1 4.3 4.3  AST 0 - 37 U/L '20 21 20  ' ALT 0 - 35 U/L '12 16 14  ' Alk Phosphatase 39 - 117 U/L 42 33(L) 50  Total Bilirubin 0.2 - 1.2 mg/dL 0.4 0.4 0.5  Bilirubin, Direct 0.0 - 0.3 mg/dL - - -    Lab Results  Component Value Date/Time   TSH 2.82 08/14/2020 02:47 PM   TSH 4.03 02/08/2019 01:34 PM   FREET4 0.76 02/08/2019 01:34 PM   FREET4 0.65  10/20/2016 08:53 AM    CBC Latest Ref Rng & Units 08/14/2020 02/08/2019 08/13/2014  WBC 4.0 - 10.5 K/uL 6.8 7.5 6.6  Hemoglobin 12.0 - 15.0 g/dL 12.4 12.6 13.4  Hematocrit 36.0 - 46.0 % 37.0 38.2 40.5  Platelets 150.0 - 400.0 K/uL 191.0 206.0 250.0    Lab Results  Component Value Date/Time   VD25OH 30.01 02/08/2019 01:34 PM   VD25OH 37.20 08/26/2015 11:40 AM    Clinical ASCVD: No  The ASCVD Risk score (Arnett DK, et al., 2019) failed to calculate for the following reasons:   The 2019 ASCVD risk score is only valid for ages 47 to 46    Depression screen PHQ 2/9 02/13/2021 12/27/2019 11/20/2018  Decreased Interest 0 0 0  Down, Depressed, Hopeless 0 0 1  PHQ - 2 Score 0 0 1  Altered sleeping - - -  Tired, decreased energy - - -  Change in appetite - - -  Feeling bad or failure about yourself  - - -  Trouble concentrating - - -  Moving slowly or fidgety/restless - - -  Suicidal thoughts - - -  PHQ-9 Score - - -  Difficult doing work/chores - - -   Social History   Tobacco Use  Smoking Status Never  Smokeless Tobacco Never   BP Readings from Last 3 Encounters:  02/13/21 140/69  08/14/20 132/70  02/05/20 124/80   Pulse Readings from Last 3 Encounters:  02/13/21 75  08/14/20 62  02/05/20 98   Wt Readings from Last 3 Encounters:  02/13/21 157 lb (71.2 kg)  08/14/20 155 lb 6.4 oz (70.5 kg)  02/05/20 161 lb (73 kg)   BMI Readings from Last 3 Encounters:  02/13/21 26.95 kg/m  08/14/20 26.67 kg/m  02/05/20 27.64 kg/m    Assessment/Interventions: Review of patient past medical history, allergies, medications, health status, including review of consultants reports, laboratory and other test data, was performed as part of comprehensive evaluation and provision of chronic care management services.   SDOH:  (Social Determinants of Health) assessments and interventions performed: Yes  SDOH Screenings   Alcohol Screen: Low Risk    Last Alcohol Screening Score (AUDIT): 0   Depression (PHQ2-9): Low Risk    PHQ-2 Score: 0  Financial Resource Strain: Low Risk    Difficulty of Paying Living Expenses: Not hard at all  Food Insecurity: No Food Insecurity   Worried About Charity fundraiser in the Last Year: Never true   Ran Out of Food in the Last Year: Never true  Housing: Low Risk    Last Housing Risk Score: 0  Physical Activity: Sufficiently Active   Days of Exercise per Week: 5 days   Minutes of Exercise per Session: 30 min  Social Connections: Moderately Integrated   Frequency of Communication with Friends and Family: More than three times a  week   Frequency of Social Gatherings with Friends and Family: More than three times a week   Attends Religious Services: More than 4 times per year   Active Member of Clubs or Organizations: Yes   Attends Archivist Meetings: More than 4 times per year   Marital Status: Widowed  Stress: No Stress Concern Present   Feeling of Stress : Not at all  Tobacco Use: Low Risk    Smoking Tobacco Use: Never   Smokeless Tobacco Use: Never   Passive Exposure: Not on file  Transportation Needs: No Transportation Needs   Lack of Transportation (Medical): No   Lack of Transportation (Non-Medical): No    CCM Care Plan  Allergies  Allergen Reactions   Sulfonamide Derivatives     REACTION: angioedema    Medications Reviewed Today     Reviewed by Tomasa Blase, [facility] (Pharmacist) on 05/11/21 at 1557  Med List Status: <None>   Medication Order Taking? Sig Documenting Provider Last Dose Status Informant  acetaminophen (TYLENOL) 500 MG tablet [id number] Yes Take 500 mg by mouth daily as needed for moderate pain. [provider] Taking Active   alendronate (FOSAMAX) 70 MG tablet [id number] Yes TAKE 1 TABLET BY MOUTH ONCE WEEKLY ON AN EMPTY STOMACH BEFORE BREAKFAST. REMAIN UPRIGHT FOR 30 MINUTES AND TAKE WITH 8 OUNCES OF WATER Hoyt Koch, MD Taking Active   ALPRAZolam [name]) 0.5 MG tablet  [id number] Yes TAKE ONE TABLET BY MOUTH TWICE A DAY  Patient taking differently: Take 0.5 mg by mouth at bedtime.   Hoyt Koch, MD Taking Active   b complex vitamins capsule [id number] Yes Take 1 capsule by mouth daily. [provider] Taking Active Self  betamethasone dipropionate 0.05 % lotion [id number] Yes Apply topically daily as needed. Uses on scalp and ears [provider] Taking Active   Cholecalciferol (VITAMIN D3 PO) [id number] Yes Take 2,000 Units by mouth daily. [provider] Taking Active Self  diclofenac sodium (VOLTAREN) 1 % GEL [id number] Yes Apply 2 g topically as needed. [provider] Taking Active Self  diphenhydramine-acetaminophen (TYLENOL PM) 25-500 MG TABS tablet [id number] Yes Take 1 tablet by mouth at bedtime as needed. [provider] Taking Active   gabapentin (NEURONTIN) 100 MG capsule [id number] Yes TAKE ONE CAPSULE BY MOUTH AT BEDTIME Janith Lima, MD Taking Active   Glucos-Chond-MSM-Bor-D3-Hyalur (MOVE FREE JOINT HEALTH ADV + D) TABS [id number] Yes Take 1 tablet by mouth daily. [provider] Taking Active   ketoconazole (NIZORAL) 2 % shampoo [id number] Yes USE TO SHAMPOO TWICE WEEKLY  Patient taking differently: Apply 1 application topically once a week.   Hoyt Koch, MD Taking Active   levothyroxine (SYNTHROID) 50 MCG tablet [id number] Yes TAKE 1 TABLET BY MOUTH DAILY BEFORE BREAKFAST Hoyt Koch, MD Taking Active   Lidocaine 4 % [facility] [id number]  Apply 1 patch topically daily. [provider]  Active   lisinopril (ZESTRIL) 20 MG tablet [id number] Yes TAKE ONE TABLET BY MOUTH DAILY Hoyt Koch, MD Taking Active   Multiple Minerals-Vitamins (CALCIUM-MAGNESIUM-ZINC-D3 PO) [id number] Yes Take 3 tablets by mouth. Calcium 1010m , Magnesium 4020m Zing 156m D3 400 units (in 1 serving)  1 serving = 3 tables [provider] Taking Active   Multiple  Vitamins-Minerals (PRESERVISION AREDS 2+MULTI VIT PO) [id number] Take 1 capsule by mouth daily. [provider] Taking Active   Omega-3 Fatty Acids (OMEGA-3 2100 PO) [id number] Take 1 capsule by mouth.  [provider] Taking Active   omeprazole (PRILOSEC) 20 MG capsule [id number] Yes TAKE ONE CAPSULE BY MOUTH DAILY ON AN EMPTY STOMACH Hoyt Koch, MD Taking Active   Polyethyl Glycol-Propyl Glycol 0.4-0.3 % SOLN [id number] Yes Apply 1 drop to eye daily as needed. [provider] Taking Active   POTASSIUM GLUCONATE PO [id number] Yes Take 1 tablet by mouth daily. [provider] Taking Active   pravastatin (PRAVACHOL) 20 MG tablet [id number] Yes TAKE ONE TABLET BY MOUTH DAILY Hoyt Koch, MD Taking Active   triamcinolone cream (KENALOG) 0.1 % [id number] Yes APPLY 1 APPLICATION TOPICALLY DAILY Hoyt Koch, MD Taking Active   vitamin C (ASCORBIC ACID) 500 MG tablet [id number] Yes Take 500 mg by mouth daily. [provider] Taking Active   Xylitol [facility] [place]) [id number] Yes Use as directed 1 tablet in the mouth or throat at bedtime. [provider] Taking Active             Patient Active Problem List   Diagnosis Date Noted   Bilateral hearing loss 08/15/2020   Neuropathy 12/27/2019   Osteoporosis 02/20/2018   Cough 02/20/2018   Rash 01/27/2017   Low back pain 07/06/2016   Right knee pain 07/06/2016   CKD (chronic kidney disease) stage 3, GFR 30-59 ml/min ([place]) 08/13/2014   Routine general medical examination at a health care facility 08/13/2014   Vitamin D deficiency 12/02/2009   AORTIC VALVE DISEASE 01/24/2008   DEGENERATIVE JOINT DISEASE 11/05/2007   Hypothyroidism 10/31/2007   Essential hypertension 10/31/2007   Allergic rhinitis 10/31/2007   Hyperlipidemia 04/12/2007   Anxiety state 04/12/2007   GERD 04/12/2007    Immunization History  Administered Date(s) Administered   Fluad Quad(high Dose  65+) 02/08/2019, 03/13/2020   Influenza Split 03/17/2011, 04/06/2012   Influenza Whole 04/12/2005, 03/27/2008, 03/26/2009, 03/14/2010   Influenza, High Dose Seasonal PF 03/18/2016, 03/10/2017, 06/16/2018   Influenza,inj,Quad PF,6+ Mos 03/22/2013, 03/22/2014, 02/24/2015   PFIZER(Purple Top)SARS-COV-2 Vaccination 07/21/2019, 08/15/2019, 04/26/2020   Pneumococcal Conjugate-13 02/24/2015   Pneumococcal Polysaccharide-23 07/17/2013    Conditions to be addressed/monitored:  Hypertension, Hyperlipidemia, Hypothyroidism, Osteoporosis, Neuropathy, GERD, CKD, and Insomnia   Care Plan : CCM Care Plan  Updates made by Tomasa Blase, [place] since 05/11/2021 12:00 AM     Problem: HTN, HLD, Osteoporosis, Hypothyroidism, GERD, Insomnia, CKD, Neuropathy   Priority: High  Onset Date: 12/08/2020     Long-Range Goal: Disease Management   Start Date: 12/08/2020  Expected End Date: 05/11/2022  This Visit's Progress: Not on track  Recent Progress: On track  Priority: High  Note:    Current Barriers:  Unable to independently monitor therapeutic efficacy Does not adhere to prescribed medication regimen  Pharmacist Clinical Goal(s):  Patient will achieve adherence to monitoring guidelines and medication adherence to achieve therapeutic efficacy achieve control of LDL  as evidenced by lipid panel maintain control of blood pressure as evidenced by blood pressure log  through collaboration with PharmD and provider.   Interventions: 1:1 collaboration with Hoyt Koch, MD regarding development and update of comprehensive plan of care as evidenced by provider attestation and co-signature Inter-disciplinary care team collaboration (see longitudinal plan of care) Comprehensive medication review performed; medication list updated in electronic medical record  Hypertension (BP goal <140/90) -Controlled -Current treatment: Lisinopril 59m daily  -Medications previously tried: furosemide    -Current office readings: unknown at this time, has not been checking recently  -Current dietary habits: cooks her own food, follows sodium reduced diet  -  Current exercise habits: limited but will go out grocery shopping/ walk in house when she can -Denies hypotensive/hypertensive symptoms -Educated on BP goals and benefits of medications for prevention of heart attack, stroke and kidney damage; Daily salt intake goal < 2300 mg; Importance of home blood pressure monitoring; Proper BP monitoring technique; Symptoms of hypotension and importance of maintaining adequate hydration; -Counseled to monitor BP at home at least once weekly, document, and provide log at future appointments -Counseled on diet and exercise extensively Recommended to continue current medication  Hyperlipidemia: (LDL goal < 100) -Not ideally controlled Lab Results  Component Value Date   LDLCALC 151 (H) 10/20/2016  -Current treatment: Pravastatin 76m daily  Omega 3 fatty acids - 21029mdaily  -Medications previously tried: fenofibrate, simvastatin   -Current dietary patterns: moderated fried/fatty food intake  -Current exercise habits: limited  -Educated on Cholesterol goals;  Benefits of statin for ASCVD risk reduction; Importance of limiting foods high in cholesterol; -Counseled on diet and exercise extensively Recommended to continue current medication - check LDL with next PCP appointment   Insomnia (Goal: promotion of quality sleep ) -Controlled -Current treatment: Alprazolam 0.55m109mightly  Tylenol PM - 1 tablet nightly   -Medications previously tried/failed: n/a -Educated on possible interactions of alprazolam with alcohol - patient does not drink alcohol -Recommended to continue current medication Educated on potential side effects for alprazolam, advised to use the smallest effective dose  Osteoporosis (Goal Prevention of Fractures) -Stable  -Last DEXA Scan: 11/21/2017   T-Score Right femoral  neck: -2.0  T-Score Left femoral neck: -2.4  T-Score lumbar spine: -2.3  10-year probability of major osteoporotic fracture: 14.9%  10-year probability of hip fracture: 5.3% -Patient is a candidate for pharmacologic treatment due to T-Score -1.0 to -2.5 and 10-year risk of hip fracture > 3% -Current treatment  Alendronate 62m18mily (started 11/2017) -  Vitamin D3 2000 units daily  Calcium/Magnesium/ Zinc/VitD3 - 3 tablets daily -total daily dose of Ca 1000mg50m 400mg,77m155mg, 30m00 units  - Estimated CrCl = 39.248 mL/min - appropriate to continue alendronate  -Medications previously tried: n/a  -Recommend 1200 mg of calcium daily from dietary and supplemental sources. Counseled on oral bisphosphonate administration: take in the morning, 30 minutes prior to food with 6-8 oz of water. Do not lie down for at least 30 minutes after taking. -Counseled on importance of adherence to prescribed medications, patient agreeable to restart alendronate   GERD (Goal: Prevention of stomach acid / flares) -Controlled -Current treatment  Omeprazole 20mg da67m -Medications previously tried: n/a  -Recommended to continue current medication  Neuropathy (Goal: pain control/ prevention of disease progression ) -Not ideally controlled  -Current treatment  Gabapentin 100mg at 12mime  -Medications previously tried: methocarbamol   -Recommended to increase gabapentin to 200mg nigh555mx 1 week, patient to increase further should neuropathy persist up to 300mg night43m Hypothyroidism (Goal: Maintenance of euthyroid levels ) -Controlled - Last TSH level 2.82 uIU/mL (08/14/2020) -Current treatment  Levothyroxine 50mcg daily67medications previously tried: n/a  -Recommended to continue current medication  Chronic Kidney Disease (Goal: Prevention of disease progression) -stable -Last eGFR - 48.90 mL/min  -Recommended for patient to stop use of Advil liquid gels, instead advised for patient to use  diclofenac gel and acetaminophen as needed for pain   Health Maintenance -Vaccine gaps: tetanus, shingles, and COVID booster  -Current therapy:  Ketoconazole 2% shampoo - used once weekly  Triamcinolone 0.1% cream - applied once daily  Vitamin B complex - 1 tablet daily  Potassium Gluconate - 1 tablet daily  Diclofenac 1% gel - 2g to affected area up to 4 times daily  APAP 557m - daily as needed for pain Lidocaine 4% patch  - 1 patch daily  Move Free (Glucosamine, Chondroitin, MSM, D3) - 1 tablet daily  Xylitol melts - 1 tablet nightly for dry mouth Systane Dry Eye drops - 1 drop into each eye daily as needed PreserVision AERDs 2 - 1 tablet daily   Vitamin C 507mdaily  Vitamin E 400 units daily  -Educated on Herbal supplement research is limited and benefits usually cannot be proven Cost vs benefit of each product must be carefully weighed by individual consumer Supplements may interfere with prescription drugs -Patient is satisfied with current therapy and denies issues -Recommended for patient to have Vit D level checked with next PCP visit   Patient Goals/Self-Care Activities Patient will:  - take medications as prescribed check blood pressure at least once weekly, document, and provide at future appointments collaborate with provider on medication access solutions  Follow Up Plan: Telephone follow up appointment with care management team member scheduled for: 6 weeks  The patient has been provided with contact information for the care management team and has been advised to call with any health related questions or concerns.      Medication Assistance: None required.  Patient affirms current coverage meets needs.  Compliance/Adherence/Medication fill history: Care Gaps: Tetanus, Shingles, and COVID booster   [facility]  [facility] [id number] [address]one:  [phone]: [id number] [address]C [place]one: [phone]: [phone] pill box? No - feels she is able to manage without  Pt endorses 90% compliance  Care Plan and Follow Up Patient Decision:  Patient agrees to Care Plan and Follow-up.  Plan: Telephone follow up appointment with care management team member scheduled for:  6 weeks and The patient has been provided with contact information for the care management team and has been advised to call with any health related questions or concerns.   DanTomasa BlaseharmD Clinical Pharmacist, [place]

## 2021-05-13 DIAGNOSIS — I10 Essential (primary) hypertension: Secondary | ICD-10-CM

## 2021-05-13 DIAGNOSIS — E782 Mixed hyperlipidemia: Secondary | ICD-10-CM

## 2021-06-22 ENCOUNTER — Ambulatory Visit (INDEPENDENT_AMBULATORY_CARE_PROVIDER_SITE_OTHER): Payer: PPO

## 2021-06-22 DIAGNOSIS — G629 Polyneuropathy, unspecified: Secondary | ICD-10-CM

## 2021-06-22 DIAGNOSIS — I10 Essential (primary) hypertension: Secondary | ICD-10-CM

## 2021-06-22 NOTE — Patient Instructions (Signed)
Visit Information  Following are the goals we discussed today:   Track and Manage My Blood Pressure  Timeframe:  Long-Range Goal Priority:  High Start Date:   12/08/2020                          Expected End Date:   06/09/2022                   Follow Up Date 09/21/2021   - check blood pressure weekly - choose a place to take my blood pressure (home, clinic or office, retail store) - write blood pressure results in a log or diary    Why is this important?   You won't feel high blood pressure, but it can still hurt your blood vessels.  High blood pressure can cause heart or kidney problems. It can also cause a stroke.  Making lifestyle changes like losing a little weight or eating less salt will help.  Checking your blood pressure at home and at different times of the day can help to control blood pressure.  If the doctor prescribes medicine remember to take it the way the doctor ordered.  Call the office if you cannot afford the medicine or if there are questions about it.  Prevent Falls and Broken Bones   Timeframe:  Long-Range Goal Priority:  High Start Date:   12/08/2020                          Expected End Date:   06/09/2022                   Follow Up Date 4/102023   - always use handrails on the stairs - always wear shoes or slippers with non-slip sole - get at least 10 minutes of activity every day - keep cell phone with me always - make an emergency alert plan in case I fall - use a cane or walker    Why is this important?   When you fall, there are 3 things that control if a bone breaks or not.  These are the fall itself, how hard and the direction that you fall and how fragile your bones are.  Preventing falls is very important for you because of fragile bones.       Plan: Telephone follow up appointment with care management team member scheduled for:  3 months  The patient has been provided with contact information for the care management team and has been  advised to call with any health related questions or concerns.   Tomasa Blase, PharmD Clinical Pharmacist, [facility]   Please call the care guide team at [phone] if you need to cancel or reschedule your appointment.   Patient verbalizes understanding of instructions provided today and agrees to view in [place].

## 2021-06-22 NOTE — Progress Notes (Signed)
Chronic Care Management Pharmacy Note  06/22/2021 Name:  Deanna Olson MRN:  [id number] DOB:  [birth year]  Summary: -Patient notes that she is still having issues with neuropathy at night - tried to increase gabapentin up to 322m nightly, but notes that she did not find it helped with neuropathy - decreased to 2069mnightly -Patient continues with voltaren gel application prior to bedtime, find mild relief from use  -Able to check blood pressure when her daughter comes to visit her, most recently was 126/68 HR 65 in left arm, and 156/61 HR 66 in the right arm - patient denies symptoms of hypo/hypertension  Recommendations/Changes made from today's visit: -Recommending no changes to medications at this time, advised for patient to continue monitoring neuropathy - should pain levels increase reach out for further evaluation -Continue monitoring BP when able, patient to reach out should BP average >140/90  Subjective: DoAvri Olson an [age].o. year old female who is a primary patient of CrHoyt KochMD.  The CCM team was consulted for assistance with disease management and care coordination needs.    Engaged with patient by telephone for follow up visit in response to provider referral for pharmacy case management and/or care coordination services.   Consent to Services:  The patient was given the following information about Chronic Care Management services today, agreed to services, and gave verbal consent: 1. CCM service includes personalized support from designated clinical staff supervised by the primary care provider, including individualized plan of care and coordination with other care providers 2. 24/7 contact phone numbers for assistance for urgent and routine care needs. 3. Service will only be billed when office clinical staff spend 20 minutes or more in a month to coordinate care. 4. Only one practitioner may furnish and bill the service in a calendar month. 5.The patient may  stop CCM services at any time (effective at the end of the month) by phone call to the office staff. 6. The patient will be responsible for cost sharing (co-pay) of up to 20% of the service fee (after annual deductible is met). Patient agreed to services and consent obtained.  Patient Care Team: CrHoyt KochMD as PCP - General (Internal Medicine) HoMinus BreedingMD as PCP - Cardiology (Cardiology) LuDruscilla BrownieMD (Dermatology) StShon HoughMD (Ophthalmology) SzDelice BisonaDarnelle MaffucciRPRidge[facility] Pharmacist (Pharmacist)  Recent office visits: No changes since last visit   Recent consult visits: No changes since last visit   Hospital visits: None in previous 6 months  Objective:  Lab Results  Component Value Date   CREATININE 1.00 08/14/2020   BUN 26 (H) 08/14/2020   GFR 48.90 (L) 08/14/2020   GFRNONAA 45.69 04/03/2010   GFRAA 69 07/29/2008   NA 138 08/14/2020   K 4.9 08/14/2020   CALCIUM 9.8 08/14/2020   CO2 27 08/14/2020   GLUCOSE 100 (H) 08/14/2020    Lab Results  Component Value Date/Time   HGBA1C 5.2 05/18/2019 11:38 AM   HGBA1C 5.4 07/04/2006 10:15 AM   GFR 48.90 (L) 08/14/2020 02:47 PM   GFR 50.76 (L) 05/18/2019 11:38 AM    Last diabetic Eye exam:  No results found for: HMDIABEYEEXA  Last diabetic Foot exam:  No results found for: HMDIABFOOTEX   Lab Results  Component Value Date   CHOL 237 (H) 08/14/2020   HDL 47.40 08/14/2020   LDLCALC 151 (H) 10/20/2016   LDLDIRECT 161.0 08/14/2020   TRIG 347.0 (H) 08/14/2020   CHOLHDL 5 08/14/2020  Hepatic Function Latest Ref Rng & Units 08/14/2020 02/08/2019 10/20/2016  Total Protein 6.0 - 8.3 g/dL 7.4 7.4 7.3  Albumin 3.5 - 5.2 g/dL 4.1 4.3 4.3  AST 0 - 37 U/L '20 21 20  ' ALT 0 - 35 U/L '12 16 14  ' Alk Phosphatase 39 - 117 U/L 42 33(L) 50  Total Bilirubin 0.2 - 1.2 mg/dL 0.4 0.4 0.5  Bilirubin, Direct 0.0 - 0.3 mg/dL - - -    Lab Results  Component Value Date/Time   TSH 2.82 08/14/2020 02:47 PM   TSH  4.03 02/08/2019 01:34 PM   FREET4 0.76 02/08/2019 01:34 PM   FREET4 0.65 10/20/2016 08:53 AM    CBC Latest Ref Rng & Units 08/14/2020 02/08/2019 08/13/2014  WBC 4.0 - 10.5 K/uL 6.8 7.5 6.6  Hemoglobin 12.0 - 15.0 g/dL 12.4 12.6 13.4  Hematocrit 36.0 - 46.0 % 37.0 38.2 40.5  Platelets 150.0 - 400.0 K/uL 191.0 206.0 250.0    Lab Results  Component Value Date/Time   VD25OH 30.01 02/08/2019 01:34 PM   VD25OH 37.20 08/26/2015 11:40 AM    Clinical ASCVD: No  The ASCVD Risk score (Arnett DK, et al., 2019) failed to calculate for the following reasons:   The 2019 ASCVD risk score is only valid for ages 47 to 48    Depression screen PHQ 2/9 02/13/2021 12/27/2019 11/20/2018  Decreased Interest 0 0 0  Down, Depressed, Hopeless 0 0 1  PHQ - 2 Score 0 0 1  Altered sleeping - - -  Tired, decreased energy - - -  Change in appetite - - -  Feeling bad or failure about yourself  - - -  Trouble concentrating - - -  Moving slowly or fidgety/restless - - -  Suicidal thoughts - - -  PHQ-9 Score - - -  Difficult doing work/chores - - -   Social History   Tobacco Use  Smoking Status Never  Smokeless Tobacco Never   BP Readings from Last 3 Encounters:  02/13/21 140/69  08/14/20 132/70  02/05/20 124/80   Pulse Readings from Last 3 Encounters:  02/13/21 75  08/14/20 62  02/05/20 98   Wt Readings from Last 3 Encounters:  02/13/21 157 lb (71.2 kg)  08/14/20 155 lb 6.4 oz (70.5 kg)  02/05/20 161 lb (73 kg)   BMI Readings from Last 3 Encounters:  02/13/21 26.95 kg/m  08/14/20 26.67 kg/m  02/05/20 27.64 kg/m    Assessment/Interventions: Review of patient past medical history, allergies, medications, health status, including review of consultants reports, laboratory and other test data, was performed as part of comprehensive evaluation and provision of chronic care management services.   SDOH:  (Social Determinants of Health) assessments and interventions performed: Yes  SDOH Screenings    Alcohol Screen: Low Risk    Last Alcohol Screening Score (AUDIT): 0  Depression (PHQ2-9): Low Risk    PHQ-2 Score: 0  Financial Resource Strain: Low Risk    Difficulty of Paying Living Expenses: Not hard at all  Food Insecurity: No Food Insecurity   Worried About Charity fundraiser in the Last Year: Never true   Ran Out of Food in the Last Year: Never true  Housing: Low Risk    Last Housing Risk Score: 0  Physical Activity: Sufficiently Active   Days of Exercise per Week: 5 days   Minutes of Exercise per Session: 30 min  Social Connections: Moderately Integrated   Frequency of Communication with Friends and Family: More than three  times a week   Frequency of Social Gatherings with Friends and Family: More than three times a week   Attends Religious Services: More than 4 times per year   Active Member of Clubs or Organizations: Yes   Attends Archivist Meetings: More than 4 times per year   Marital Status: Widowed  Stress: No Stress Concern Present   Feeling of Stress : Not at all  Tobacco Use: Low Risk    Smoking Tobacco Use: Never   Smokeless Tobacco Use: Never   Passive Exposure: Not on file  Transportation Needs: No Transportation Needs   Lack of Transportation (Medical): No   Lack of Transportation (Non-Medical): No    CCM Care Plan  Allergies  Allergen Reactions   Sulfonamide Derivatives     REACTION: angioedema    Medications Reviewed Today     Reviewed by Tomasa Blase, [facility] (Pharmacist) on 06/22/21 at 1006  Med List Status: <None>   Medication Order Taking? Sig Documenting Provider Last Dose Status Informant  acetaminophen (TYLENOL) 500 MG tablet [id number] Yes Take 500 mg by mouth daily as needed for moderate pain. [provider] Taking Active   alendronate (FOSAMAX) 70 MG tablet [id number] Yes TAKE 1 TABLET BY MOUTH ONCE WEEKLY ON AN EMPTY STOMACH BEFORE BREAKFAST. REMAIN UPRIGHT FOR 30 MINUTES AND TAKE WITH 8 OUNCES OF WATER Hoyt Koch, MD Taking Active   ALPRAZolam [name]) 0.5 MG tablet [id number] Yes TAKE ONE TABLET BY MOUTH TWICE A DAY  Patient taking differently: Take 0.5 mg by mouth at bedtime.   Hoyt Koch, MD Taking Active   b complex vitamins capsule [id number] Yes Take 1 capsule by mouth daily. [provider] Taking Active Self  betamethasone dipropionate 0.05 % lotion [id number] Yes Apply topically daily as needed. Uses on scalp and ears [provider] Taking Active   Cholecalciferol (VITAMIN D3 PO) [id number] Yes Take 2,000 Units by mouth daily. [provider] Taking Active Self  diclofenac sodium (VOLTAREN) 1 % GEL [id number] Yes Apply 2 g topically as needed. [provider] Taking Active Self  diphenhydramine-acetaminophen (TYLENOL PM) 25-500 MG TABS tablet [id number] Yes Take 1 tablet by mouth at bedtime as needed. [provider] Taking Active   gabapentin (NEURONTIN) 100 MG capsule [id number] Yes TAKE ONE CAPSULE BY MOUTH AT BEDTIME  Patient taking differently: Take 200 mg by mouth at bedtime.   Janith Lima, MD Taking Active   Glucos-Chond-MSM-Bor-D3-Hyalur (MOVE FREE JOINT HEALTH ADV + D) TABS [id number] Yes Take 1 tablet by mouth daily. [provider] Taking Active   ketoconazole (NIZORAL) 2 % shampoo [id number] Yes USE TO SHAMPOO TWICE WEEKLY  Patient taking differently: Apply 1 application topically once a week.   Hoyt Koch, MD Taking Active   levothyroxine (SYNTHROID) 50 MCG tablet [id number] Yes TAKE 1 TABLET BY MOUTH DAILY BEFORE BREAKFAST Hoyt Koch, MD Taking Active   Lidocaine 4 % [facility] [id number] Yes Apply 1 patch topically daily. [provider] Taking Active   lisinopril (ZESTRIL) 20 MG tablet [id number] Yes TAKE ONE TABLET BY MOUTH DAILY Hoyt Koch, MD Taking Active   Multiple Minerals-Vitamins (CALCIUM-MAGNESIUM-ZINC-D3 PO) [id number] Yes Take 3 tablets by mouth. Calcium 1030m ,  Magnesium 4048m Zing 155m D3 400 units (in 1 serving)  1 serving = 3 tables [provider] Taking Active   Multiple Vitamins-Minerals (PRESERVISION AREDS 2+MULTI VIT PO) [id number] Take 1 capsule by mouth daily. [provider] Taking Active  Omega-3 Fatty Acids (OMEGA-3 2100 PO) [id number] Yes Take 1 capsule by mouth. [provider] Taking Active   omeprazole (PRILOSEC) 20 MG capsule [id number] Yes TAKE ONE CAPSULE BY MOUTH DAILY ON AN EMPTY STOMACH Hoyt Koch, MD Taking Active   Polyethyl Glycol-Propyl Glycol 0.4-0.3 % SOLN [id number] Yes Apply 1 drop to eye daily as needed. [provider] Taking Active   POTASSIUM GLUCONATE PO [id number] Yes Take 1 tablet by mouth daily. [provider] Taking Active   pravastatin (PRAVACHOL) 20 MG tablet [id number] Yes TAKE ONE TABLET BY MOUTH DAILY Hoyt Koch, MD Taking Active   triamcinolone cream (KENALOG) 0.1 % [id number] Yes APPLY 1 APPLICATION TOPICALLY DAILY Hoyt Koch, MD Taking Active   vitamin C (ASCORBIC ACID) 500 MG tablet [id number] Yes Take 500 mg by mouth daily. [provider] Taking Active   Xylitol [facility]) [id number] Yes Use as directed 1 tablet in the mouth or throat at bedtime. [provider] Taking Active             Patient Active Problem List   Diagnosis Date Noted   Bilateral hearing loss 08/15/2020   Neuropathy 12/27/2019   Osteoporosis 02/20/2018   Cough 02/20/2018   Rash 01/27/2017   Low back pain 07/06/2016   Right knee pain 07/06/2016   CKD (chronic kidney disease) stage 3, GFR 30-59 ml/min ([place]) 08/13/2014   Routine general medical examination at a health care facility 08/13/2014   Vitamin D deficiency 12/02/2009   AORTIC VALVE DISEASE 01/24/2008   DEGENERATIVE JOINT DISEASE 11/05/2007   Hypothyroidism 10/31/2007   Essential hypertension 10/31/2007   Allergic rhinitis 10/31/2007   Hyperlipidemia 04/12/2007    Anxiety state 04/12/2007   GERD 04/12/2007    Immunization History  Administered Date(s) Administered   Fluad Quad(high Dose 65+) 02/08/2019, 03/13/2020   Influenza Split 03/17/2011, 04/06/2012   Influenza Whole 04/12/2005, 03/27/2008, 03/26/2009, 03/14/2010   Influenza, High Dose Seasonal PF 03/18/2016, 03/10/2017, 06/16/2018   Influenza,inj,Quad PF,6+ Mos 03/22/2013, 03/22/2014, 02/24/2015   PFIZER(Purple Top)SARS-COV-2 Vaccination 07/21/2019, 08/15/2019, 04/26/2020   Pneumococcal Conjugate-13 02/24/2015   Pneumococcal Polysaccharide-23 07/17/2013    Conditions to be addressed/monitored:  Hypertension, Hyperlipidemia, Hypothyroidism, Osteoporosis, Neuropathy, GERD, CKD, and Insomnia   Care Plan : CCM Care Plan  Updates made by Tomasa Blase, RPH since 06/22/2021 12:00 AM     Problem: HTN, HLD, Osteoporosis, Hypothyroidism, GERD, Insomnia, CKD, Neuropathy   Priority: High  Onset Date: 12/08/2020     Long-Range Goal: Disease Management   Start Date: 12/08/2020  Expected End Date: 05/11/2022  This Visit's Progress: On track  Recent Progress: Not on track  Priority: High  Note:    Current Barriers:  Unable to independently monitor therapeutic efficacy Does not adhere to prescribed medication regimen  Pharmacist Clinical Goal(s):  Patient will achieve adherence to monitoring guidelines and medication adherence to achieve therapeutic efficacy achieve control of LDL  as evidenced by lipid panel maintain control of blood pressure as evidenced by blood pressure log  through collaboration with PharmD and provider.   Interventions: 1:1 collaboration with Hoyt Koch, MD regarding development and update of comprehensive plan of care as evidenced by provider attestation and co-signature Inter-disciplinary care team collaboration (see longitudinal plan of care) Comprehensive medication review performed; medication list updated in electronic medical record  Hypertension  (BP goal <140/90) -Controlled -Current treatment: Lisinopril 18m daily  -Medications previously tried: furosemide   -Current office readings: only able to check when daughter visits her -  most recently was 126/68 HR of 65 in left arm and 156/61 HR 66 in right arm  -Current dietary habits: cooks her own food, follows sodium reduced diet  -Current exercise habits: limited but will go out grocery shopping/ walk in house when she can -Denies hypotensive/hypertensive symptoms -Educated on BP goals and benefits of medications for prevention of heart attack, stroke and kidney damage; Daily salt intake goal < 2300 mg; Importance of home blood pressure monitoring; Proper BP monitoring technique; Symptoms of hypotension and importance of maintaining adequate hydration; -Counseled to monitor BP at home at least once weekly, document, and provide log at future appointments -Counseled on diet and exercise extensively Recommended to continue current medication  Hyperlipidemia: (LDL goal < 100) -Not ideally controlled Lab Results  Component Value Date   LDLCALC 151 (H) 10/20/2016  -Current treatment: Pravastatin 12m daily  Omega 3 fatty acids - 21062mdaily  -Medications previously tried: fenofibrate, simvastatin   -Current dietary patterns: moderated fried/fatty food intake  -Current exercise habits: limited  -Educated on Cholesterol goals;  Benefits of statin for ASCVD risk reduction; Importance of limiting foods high in cholesterol; -Counseled on diet and exercise extensively Recommended to continue current medication - check LDL with next PCP appointment   Insomnia (Goal: promotion of quality sleep ) -Controlled -Current treatment: Alprazolam 0.11m52mightly  Tylenol PM - 1 tablet nightly   -Medications previously tried/failed: n/a -Educated on possible interactions of alprazolam with alcohol - patient does not drink alcohol -Recommended to continue current medication Educated on  potential side effects for alprazolam, advised to use the smallest effective dose  Osteoporosis (Goal Prevention of Fractures) -Stable  -Last DEXA Scan: 11/21/2017   T-Score Right femoral neck: -2.0  T-Score Left femoral neck: -2.4  T-Score lumbar spine: -2.3  10-year probability of major osteoporotic fracture: 14.9%  10-year probability of hip fracture: 5.3% -Patient is a candidate for pharmacologic treatment due to T-Score -1.0 to -2.5 and 10-year risk of hip fracture > 3% -Current treatment  Alendronate 31m9mily (started 11/2017) -  Vitamin D3 2000 units daily  Calcium/Magnesium/ Zinc/VitD3 - 3 tablets daily -total daily dose of Ca 1000mg30m 400mg,88m111mg, 46m00 units  - Estimated CrCl = 39.248 mL/min - appropriate to continue alendronate  -Medications previously tried: n/a  -Recommend 1200 mg of calcium daily from dietary and supplemental sources. Counseled on oral bisphosphonate administration: take in the morning, 30 minutes prior to food with 6-8 oz of water. Do not lie down for at least 30 minutes after taking. -Counseled on importance of adherence to prescribed medications, patient agreeable to restart alendronate   GERD (Goal: Prevention of stomach acid / flares) -Controlled -Current treatment  Omeprazole 20mg da62m -Medications previously tried: n/a  -Recommended to continue current medication  Neuropathy (Goal: pain control/ prevention of disease progression ) -Not ideally controlled  -Current treatment  Gabapentin 200mg at 24mime  -Medications previously tried: methocarbamol   -Recommended too continue current medications, reach out to office should pain levels increase / are no longer adequately controlled   Hypothyroidism (Goal: Maintenance of euthyroid levels ) -Controlled Lab Results  Component Value Date   TSH 2.82 08/14/2020  -Current treatment  Levothyroxine 50mcg dai89m-Medications previously tried: n/a  -Recommended to continue current  medication  Chronic Kidney Disease (Goal: Prevention of disease progression) -stable -Last eGFR - 48.90 mL/min  -Recommended for patient to stop use of Advil liquid gels, instead advised for patient to use diclofenac gel and acetaminophen as  needed for pain   Health Maintenance -Vaccine gaps: tetanus, shingles, and COVID booster  -Current therapy:  Ketoconazole 2% shampoo - used once weekly  Triamcinolone 0.1% cream - applied once daily  Vitamin B complex - 1 tablet daily  Potassium Gluconate - 1 tablet daily  Diclofenac 1% gel - 2g to affected area up to 4 times daily  APAP 549m - daily as needed for pain Lidocaine 4% patch  - 1 patch daily  Move Free (Glucosamine, Chondroitin, MSM, D3) - 1 tablet daily  Xylitol melts - 1 tablet nightly for dry mouth Systane Dry Eye drops - 1 drop into each eye daily as needed PreserVision AERDs 2 - 1 tablet daily   Vitamin C 5075mdaily  Vitamin E 400 units daily  -Educated on Herbal supplement research is limited and benefits usually cannot be proven Cost vs benefit of each product must be carefully weighed by individual consumer Supplements may interfere with prescription drugs -Patient is satisfied with current therapy and denies issues -Recommended for patient to have Vit D level checked with next PCP visit   Patient Goals/Self-Care Activities Patient will:  - take medications as prescribed check blood pressure at least once weekly, document, and provide at future appointments collaborate with provider on medication access solutions  Follow Up Plan: Telephone follow up appointment with care management team member scheduled for: 3 months  The patient has been provided with contact information for the care management team and has been advised to call with any health related questions or concerns.       Medication Assistance: None required.  Patient affirms current coverage meets needs.  Compliance/Adherence/Medication fill  history: Care Gaps: Tetanus, Shingles, and COVID booster   [facility]  [facility] [id number] [address] [place]hone: [phone]: [id number] [address]one: [phone]: [phone] pill box? No - feels she is able to manage without  Pt endorses 90% compliance  Care Plan and Follow Up Patient Decision:  Patient agrees to Care Plan and Follow-up.  Plan: Telephone follow up appointment with care management team member scheduled for: 3 months and The patient has been provided with contact information for the care management team and has been advised to call with any health related questions or concerns.   DanTomasa BlaseharmD Clinical Pharmacist, [place]

## 2021-06-24 ENCOUNTER — Telehealth: Payer: Self-pay | Admitting: Internal Medicine

## 2021-06-24 NOTE — Telephone Encounter (Signed)
1.Medication Requested: betamethasone dipropionate 0.05 % lotion  2. Pharmacy (Name[facility] [place]): [id number] - [place], [place]  Phone:  [phone] Fax:  [phone]   3. On Med List: yes  4. Last Visit with PCP: 03.03.22  5. Next visit date with PCP: n/a   Agent: Please be advised that RX refills may take up to 3 business days. We ask that you follow-up with your pharmacy.

## 2021-06-26 MED ORDER — GABAPENTIN 100 MG PO CAPS: 100.0000 mg | ORAL_CAPSULE | Freq: Every day | ORAL | 0 refills | Status: DC

## 2021-06-26 NOTE — Telephone Encounter (Signed)
Pharmacy called stating patient is currently 38 days in out of her 90 day gabapentin medication and is out  Patient saw Linna Hoff ([facility] pharmacist) on 06-22-2021   On pt summary Dan stated "Patient notes that she is still having issues with neuropathy at night - tried to increase gabapentin up to 300mg  nightly, but notes that she did not find it helped with neuropathy - decreased to 200mg  nightly"  Pharmacy is requesting a new rx w/ directions of Take 2-3 capsule by mouth at bedtime, in order to refill patient's medication  Please advise

## 2021-06-26 NOTE — Telephone Encounter (Signed)
1.Medication Requested: gabapentin (NEURONTIN) 100 MG capsule  betamethasone dipropionate 0.05 % lotion  2. Pharmacy (Name[facility]): [id number] - [place], [place]  Phone:      [phone] Fax:           [phone]  3. On Med List: YES  4. Last Visit with PCP: 03.03.22  5. Next visit date with PCP: N/A     Agent: Please be advised that RX refills may take up to 3 business days. We ask that you follow-up with your pharmacy.

## 2021-06-26 NOTE — Telephone Encounter (Signed)
Refill has been sent to the pt's pharmacy  

## 2021-06-26 NOTE — Telephone Encounter (Signed)
Gabapentin was sent in for 1 year supply back in September can call pharmacy for refill

## 2021-06-26 NOTE — Addendum Note (Signed)
Addended by: Thomes Cake on: 06/26/2021 03:02 PM   Modules accepted: Orders

## 2021-06-26 NOTE — Telephone Encounter (Signed)
Can we call patient and find out how she is taking this?

## 2021-06-26 NOTE — Telephone Encounter (Signed)
See below

## 2021-06-29 MED ORDER — GABAPENTIN 300 MG PO CAPS: 300.0000 mg | ORAL_CAPSULE | Freq: Every day | ORAL | 1 refills | Status: DC

## 2021-06-29 NOTE — Telephone Encounter (Signed)
Pt is aware that of her medication change. No other questions or concerns.

## 2021-06-29 NOTE — Telephone Encounter (Signed)
Spoke with the pt and she stated that she has been taking 200 mg of gabapentin at bedtime. She has ran out of her medication and pharmacy will not refill it due to her being 30 days into her 90 day supply. I advised the pt that Dr. Sharlet Salina prescribed 100 mg at bedtime. She then stated that doesn't work so she double up. She is currently out of gabapentin. Her daughter brought her Gabapentin 300 mg over the weekend and it helped. She would like her dose increased to 300 mg.

## 2021-06-29 NOTE — Addendum Note (Signed)
Addended by: Pricilla Holm A on: 06/29/2021 11:43 AM   Modules accepted: Orders

## 2021-06-29 NOTE — Telephone Encounter (Signed)
Patient calling in  Patient wants to speak to nurse about refilling her gabapentin.. says she contacted pharmacy & they advised her it was too soon to pick up.. patient says she has been taking it twice daily instead of the written 1 time daily  Please call patient [phone]

## 2021-06-29 NOTE — Telephone Encounter (Signed)
Sent in 300 mg QHS gabapentin.

## 2021-07-14 DIAGNOSIS — I10 Essential (primary) hypertension: Secondary | ICD-10-CM | POA: Diagnosis not present

## 2021-07-14 DIAGNOSIS — G629 Polyneuropathy, unspecified: Secondary | ICD-10-CM

## 2021-07-23 ENCOUNTER — Other Ambulatory Visit: Payer: Self-pay | Admitting: Internal Medicine

## 2021-08-16 ENCOUNTER — Other Ambulatory Visit: Payer: Self-pay | Admitting: Internal Medicine

## 2021-08-19 ENCOUNTER — Telehealth: Payer: Self-pay | Admitting: Internal Medicine

## 2021-08-19 NOTE — Telephone Encounter (Signed)
1.Medication Requested: betamethasone dipropionate 0.05 % lotion ? ?2. Pharmacy (Name, Street, [facility]): [facility] [id number] - [place], [place]  ?Phone:  [phone] ?Fax:  [phone] ? ? ?3. On Med List: yes ? ?4. Last Visit with PCP: 03.03.22 ? ?5. Next visit date with PCP: ? ? ?Agent: Please be advised that RX refills may take up to 3 business days. We ask that you follow-up with your pharmacy.  ?

## 2021-08-20 NOTE — Telephone Encounter (Signed)
We have prescribed her triamcinolone in the past. Where is she using betamethasone on (body location) as this is not safe for ongoing use depending on body area.  ?

## 2021-08-21 NOTE — Telephone Encounter (Signed)
Spoke with the pt and she stated that she does not need a refill of her betamethasone. She state that she is using her triamcinolone as prescribed. No other questions or concerns.  ?

## 2021-09-03 ENCOUNTER — Other Ambulatory Visit: Payer: Self-pay | Admitting: Internal Medicine

## 2021-09-10 ENCOUNTER — Telehealth: Payer: Self-pay

## 2021-09-10 ENCOUNTER — Other Ambulatory Visit: Payer: Self-pay | Admitting: Internal Medicine

## 2021-09-10 NOTE — Telephone Encounter (Signed)
Pt is requesting a refill for: ?omeprazole (PRILOSEC) 20 MG capsule ? ?Pharmacy: ?[facility] [id number] [place], [place] ? ?LOV 08/14/20 ? ?

## 2021-09-10 NOTE — Telephone Encounter (Signed)
Refill has been sen in  ?

## 2021-09-21 ENCOUNTER — Ambulatory Visit (INDEPENDENT_AMBULATORY_CARE_PROVIDER_SITE_OTHER): Payer: PPO

## 2021-09-21 DIAGNOSIS — E039 Hypothyroidism, unspecified: Secondary | ICD-10-CM

## 2021-09-21 DIAGNOSIS — G629 Polyneuropathy, unspecified: Secondary | ICD-10-CM

## 2021-09-21 DIAGNOSIS — E782 Mixed hyperlipidemia: Secondary | ICD-10-CM

## 2021-09-21 DIAGNOSIS — I10 Essential (primary) hypertension: Secondary | ICD-10-CM

## 2021-09-21 NOTE — Patient Instructions (Signed)
Visit Information ? ?Following are the goals we discussed today:  ? ?Manage My Medicine  ? ?Timeframe:  Long-Range Goal ?Priority:  High ?Start Date:   12/08/2020                          ?Expected End Date:   06/09/2022                  ? ?Follow Up Date 03/2022 ?  ?- always use handrails on the stairs ?- always wear shoes or slippers with non-slip sole ?- get at least 10 minutes of activity every day ?- keep cell phone with me always ?- make an emergency alert plan in case I fall ?- use a cane or walker  ?  ?Why is this important?   ?When you fall, there are 3 things that control if a bone breaks or not.  ?These are the fall itself, how hard and the direction that you fall and how fragile your bones are.  ?Preventing falls is very important for you because of fragile bones.   ? ?Plan: Telephone follow up appointment with care management team member scheduled for:  6 months ?The patient has been provided with contact information for the care management team and has been advised to call with any health related questions or concerns.  ? ?Tomasa Blase, PharmD ?Clinical Pharmacist, [place]  ? ?Please call the care guide team at [phone] if you need to cancel or reschedule your appointment.  ? ?Patient verbalizes understanding of instructions and care plan provided today and agrees to view in [place]. Active MyChart status confirmed with patient.   ? ?

## 2021-09-21 NOTE — Progress Notes (Cosign Needed)
? ?Chronic Care Management ?Pharmacy Note ? ?09/21/2021 ?Name:  Deanna Olson MRN:  [id number] DOB:  1925-08-30 ? ?Summary: ?-Patient confirms compliance to current medications, denies any issues or concerns with current medications  ?-Notes that she has not recently been checking BP at home, but daughter does help with medications and checks BP for her - plans to restarting checking  ? ?Recommendations/Changes made from today's visit: ?-Recommending no changes to medications at this time, advised for patient to continue monitoring neuropathy - should pain levels increase reach out for further evaluation ?-Continue monitoring BP when able, patient to reach out should BP average >140/90 ? ?Subjective: ?[name] is an 88 y.o. year old female who is a primary patient of Hoyt Koch, MD.  The CCM team was consulted for assistance with disease management and care coordination needs.   ? ?Engaged with patient by telephone for follow up visit in response to provider referral for pharmacy case management and/or care coordination services.  ? ?Consent to Services:  ?The patient was given the following information about Chronic Care Management services today, agreed to services, and gave verbal consent: 1. CCM service includes personalized support from designated clinical staff supervised by the primary care provider, including individualized plan of care and coordination with other care providers 2. 24/7 contact phone numbers for assistance for urgent and routine care needs. 3. Service will only be billed when office clinical staff spend 20 minutes or more in a month to coordinate care. 4. Only one practitioner may furnish and bill the service in a calendar month. 5.The patient may stop CCM services at any time (effective at the end of the month) by phone call to the office staff. 6. The patient will be responsible for cost sharing (co-pay) of up to 20% of the service fee (after annual deductible is met). Patient  agreed to services and consent obtained. ? ?Patient Care Team: ?Hoyt Koch, MD as PCP - General (Internal Medicine) ?Minus Breeding, MD as PCP - Cardiology (Cardiology) ?Druscilla Brownie, MD (Dermatology) ?Shon Hough, MD (Ophthalmology) ?Tomasa Blase, [facility] as Pharmacist (Pharmacist) ? ?Recent office visits: ?No changes since last visit  ? ?Recent consult visits: ?No changes since last visit  ? ?Hospital visits: ?None in previous 6 months ? ?Objective: ? ?Lab Results  ?Component Value Date  ? CREATININE 1.00 08/14/2020  ? BUN 26 (H) 08/14/2020  ? GFR 48.90 (L) 08/14/2020  ? GFRNONAA 45.69 04/03/2010  ? GFRAA 69 07/29/2008  ? NA 138 08/14/2020  ? K 4.9 08/14/2020  ? CALCIUM 9.8 08/14/2020  ? CO2 27 08/14/2020  ? GLUCOSE 100 (H) 08/14/2020  ? ? ?Lab Results  ?Component Value Date/Time  ? HGBA1C 5.2 05/18/2019 11:38 AM  ? HGBA1C 5.4 07/04/2006 10:15 AM  ? GFR 48.90 (L) 08/14/2020 02:47 PM  ? GFR 50.76 (L) 05/18/2019 11:38 AM  ?  ?Last diabetic Eye exam:  ?No results found for: HMDIABEYEEXA  ?Last diabetic Foot exam:  ?No results found for: HMDIABFOOTEX  ? ?Lab Results  ?Component Value Date  ? CHOL 237 (H) 08/14/2020  ? HDL 47.40 08/14/2020  ? LDLCALC 151 (H) 10/20/2016  ? LDLDIRECT 161.0 08/14/2020  ? TRIG 347.0 (H) 08/14/2020  ? CHOLHDL 5 08/14/2020  ? ? ? ?  Latest Ref Rng & Units 08/14/2020  ?  2:47 PM 02/08/2019  ?  1:34 PM 10/20/2016  ?  8:53 AM  ?Hepatic Function  ?Total Protein 6.0 - 8.3 g/dL 7.4   7.4   7.3    ?  Albumin 3.5 - 5.2 g/dL 4.1   4.3   4.3    ?AST 0 - 37 U/L '20   21   20    ' ?ALT 0 - 35 U/L '12   16   14    ' ?Alk Phosphatase 39 - 117 U/L 42   33   50    ?Total Bilirubin 0.2 - 1.2 mg/dL 0.4   0.4   0.5    ? ? ?Lab Results  ?Component Value Date/Time  ? TSH 2.82 08/14/2020 02:47 PM  ? TSH 4.03 02/08/2019 01:34 PM  ? FREET4 0.76 02/08/2019 01:34 PM  ? FREET4 0.65 10/20/2016 08:53 AM  ? ? ? ?  Latest Ref Rng & Units 08/14/2020  ?  2:47 PM 02/08/2019  ?  1:34 PM 08/13/2014  ? 12:01 PM  ?CBC  ?WBC  4.0 - 10.5 K/uL 6.8   7.5   6.6    ?Hemoglobin 12.0 - 15.0 g/dL 12.4   12.6   13.4    ?Hematocrit 36.0 - 46.0 % 37.0   38.2   40.5    ?Platelets 150.0 - 400.0 K/uL 191.0   206.0   250.0    ? ? ?Lab Results  ?Component Value Date/Time  ? VD25OH 30.01 02/08/2019 01:34 PM  ? VD25OH 37.20 08/26/2015 11:40 AM  ? ? ?Clinical ASCVD: No  ?The ASCVD Risk score (Arnett DK, et al., 2019) failed to calculate for the following reasons: ?  The 2019 ASCVD risk score is only valid for ages 93 to 51   ? ? ?  02/13/2021  ?  9:05 AM 12/27/2019  ? 11:07 AM 11/20/2018  ? 10:27 AM  ?Depression screen PHQ 2/9  ?Decreased Interest 0 0 0  ?Down, Depressed, Hopeless 0 0 1  ?PHQ - 2 Score 0 0 1  ? ?Social History  ? ?Tobacco Use  ?Smoking Status Never  ?Smokeless Tobacco Never  ? ?BP Readings from Last 3 Encounters:  ?02/13/21 140/69  ?08/14/20 132/70  ?02/05/20 124/80  ? ?Pulse Readings from Last 3 Encounters:  ?02/13/21 75  ?08/14/20 62  ?02/05/20 98  ? ?Wt Readings from Last 3 Encounters:  ?02/13/21 157 lb (71.2 kg)  ?08/14/20 155 lb 6.4 oz (70.5 kg)  ?02/05/20 161 lb (73 kg)  ? ?BMI Readings from Last 3 Encounters:  ?02/13/21 26.95 kg/m?  ?08/14/20 26.67 kg/m?  ?02/05/20 27.64 kg/m?  ? ? ?Assessment/Interventions: Review of patient past medical history, allergies, medications, health status, including review of consultants reports, laboratory and other test data, was performed as part of comprehensive evaluation and provision of chronic care management services.  ? ?SDOH:  (Social Determinants of Health) assessments and interventions performed: Yes ? ?SDOH Screenings  ? ?Alcohol Screen: Low Risk   ? Last Alcohol Screening Score (AUDIT): 0  ?Depression (PHQ2-9): Low Risk   ? PHQ-2 Score: 0  ?Financial Resource Strain: Low Risk   ? Difficulty of Paying Living Expenses: Not hard at all  ?Food Insecurity: No Food Insecurity  ? Worried About Charity fundraiser in the Last Year: Never true  ? Ran Out of Food in the Last Year: Never true  ?Housing:  Low Risk   ? Last Housing Risk Score: 0  ?Physical Activity: Sufficiently Active  ? Days of Exercise per Week: 5 days  ? Minutes of Exercise per Session: 30 min  ?Social Connections: Moderately Integrated  ? Frequency of Communication with Friends and Family: More than three times a week  ? Frequency  of Social Gatherings with Friends and Family: More than three times a week  ? Attends Religious Services: More than 4 times per year  ? Active Member of Clubs or Organizations: Yes  ? Attends Archivist Meetings: More than 4 times per year  ? Marital Status: Widowed  ?Stress: No Stress Concern Present  ? Feeling of Stress : Not at all  ?Tobacco Use: Low Risk   ? Smoking Tobacco Use: Never  ? Smokeless Tobacco Use: Never  ? Passive Exposure: Not on file  ?Transportation Needs: No Transportation Needs  ? Lack of Transportation (Medical): No  ? Lack of Transportation (Non-Medical): No  ? ? ?CCM Care Plan ? ?Allergies  ?Allergen Reactions  ? Sulfonamide Derivatives   ?  REACTION: angioedema  ? ? ?Medications Reviewed Today   ? ? Reviewed by Tomasa Blase, [facility] (Pharmacist) on 09/21/21 at 1022  Med List Status: <None>  ? ?Medication Order Taking? Sig Documenting Provider Last Dose Status Informant  ?acetaminophen (TYLENOL) 500 MG tablet [id number] Yes Take 500 mg by mouth daily as needed for moderate pain. [provider] Taking Active   ?alendronate (FOSAMAX) 70 MG tablet [id number] Yes TAKE 1 TABLET BY MOUTH ONCE WEEKLY ON AN EMPTY STOMACH BEFORE BREAKFAST. REMAIN UPRIGHT FOR 30 MINUTES AND TAKE WITH 8 OUNCES OF WATER Hoyt Koch, MD Taking Active   ?ALPRAZolam (XANAX) 0.5 MG tablet [id number] Yes TAKE ONE TABLET BY MOUTH TWICE A DAY  ?Patient taking differently: Take 0.5 mg by mouth at bedtime.  ? Hoyt Koch, MD Taking Active   ?b complex vitamins capsule [id number] Yes Take 1 capsule by mouth daily. [provider] Taking Active Self  ?betamethasone dipropionate 0.05 %  lotion [id number] Yes Apply topically daily as needed. Uses on scalp and ears [provider] Taking Active   ?Cholecalciferol (VITAMIN D3 PO) [id number] Yes Take 2,000 Units by mouth daily. Provid

## 2021-09-26 ENCOUNTER — Other Ambulatory Visit: Payer: Self-pay | Admitting: Internal Medicine

## 2021-09-30 ENCOUNTER — Other Ambulatory Visit: Payer: Self-pay | Admitting: Internal Medicine

## 2021-10-02 ENCOUNTER — Other Ambulatory Visit: Payer: Self-pay | Admitting: Internal Medicine

## 2021-10-08 ENCOUNTER — Other Ambulatory Visit: Payer: Self-pay | Admitting: Internal Medicine

## 2021-10-08 ENCOUNTER — Telehealth: Payer: Self-pay | Admitting: Internal Medicine

## 2021-10-08 NOTE — Telephone Encounter (Signed)
1.Medication Requested: ?triamcinolone cream (KENALOG) 0.1 % ?2. Pharmacy (Name, Street, [place]): ?[id number] - [place], [place] Phone:  [phone]  ?Fax:  [phone]  ?  ? ?3. On Med List: yes ? ?4. Last Visit with PCP: ? ?5. Next visit date with PCP: ? ?Pt states she is going to pharmacy today and is requesting that if possible, to fill it today.  ? ?Agent: Please be advised that RX refills may take up to 3 business days. We ask that you follow-up with your pharmacy.  ?

## 2021-10-08 NOTE — Telephone Encounter (Signed)
Addressed via rx refill pool.  ?

## 2021-10-11 ENCOUNTER — Other Ambulatory Visit: Payer: Self-pay | Admitting: Internal Medicine

## 2021-10-11 DIAGNOSIS — I10 Essential (primary) hypertension: Secondary | ICD-10-CM

## 2021-10-11 DIAGNOSIS — E782 Mixed hyperlipidemia: Secondary | ICD-10-CM | POA: Diagnosis not present

## 2021-10-11 DIAGNOSIS — E039 Hypothyroidism, unspecified: Secondary | ICD-10-CM | POA: Diagnosis not present

## 2021-10-12 ENCOUNTER — Telehealth: Payer: Self-pay | Admitting: Internal Medicine

## 2021-10-12 NOTE — Telephone Encounter (Signed)
Patients needs the following medication filled - Betanethsone, kenalog and fosamax sent to [facility] ?

## 2021-10-13 MED ORDER — TRIAMCINOLONE ACETONIDE 0.1 % EX CREA
TOPICAL_CREAM | Freq: Two times a day (BID) | CUTANEOUS | 11 refills | Status: DC
Start: 2021-10-13 — End: 2021-12-27

## 2021-10-13 MED ORDER — ALENDRONATE SODIUM 70 MG PO TABS: 70.0000 mg | ORAL_TABLET | ORAL | 3 refills | Status: DC

## 2021-10-13 MED ORDER — BETAMETHASONE DIPROPIONATE 0.05 % EX LOTN: TOPICAL_LOTION | Freq: Every day | CUTANEOUS | 2 refills | Status: DC | PRN

## 2021-10-13 NOTE — Telephone Encounter (Signed)
Sent to pharmacy on file 

## 2021-10-14 NOTE — Telephone Encounter (Signed)
Pt called in requesting a refill on Ketoconazole Shampoo.  ? ?Please send to [facility].  ?

## 2021-10-15 MED ORDER — KETOCONAZOLE 2 % EX SHAM: MEDICATED_SHAMPOO | CUTANEOUS | 0 refills | Status: DC

## 2021-10-15 NOTE — Telephone Encounter (Signed)
Pt daughter called in and states pt needs shampoo for tomorrow and asked that it be expedited.  ?

## 2021-10-15 NOTE — Telephone Encounter (Signed)
Refill has been sent to the patient's pharmacy.  

## 2021-10-15 NOTE — Addendum Note (Signed)
Addended by: Thomes Cake on: 10/15/2021 04:35 PM ? ? Modules accepted: Orders ? ?

## 2021-10-22 ENCOUNTER — Telehealth: Payer: Self-pay | Admitting: Internal Medicine

## 2021-10-22 ENCOUNTER — Encounter: Payer: Self-pay | Admitting: Family Medicine

## 2021-10-22 ENCOUNTER — Telehealth (INDEPENDENT_AMBULATORY_CARE_PROVIDER_SITE_OTHER): Payer: PPO | Admitting: Family Medicine

## 2021-10-22 DIAGNOSIS — J029 Acute pharyngitis, unspecified: Secondary | ICD-10-CM | POA: Diagnosis not present

## 2021-10-22 DIAGNOSIS — R051 Acute cough: Secondary | ICD-10-CM

## 2021-10-22 MED ORDER — BENZONATATE 200 MG PO CAPS: 200.0000 mg | ORAL_CAPSULE | Freq: Two times a day (BID) | ORAL | 0 refills | Status: DC | PRN

## 2021-10-22 NOTE — Telephone Encounter (Signed)
PT calls today in regards to getting a medication. PT has had a cough for the last couple of days with white mucus build up. No signs of fever and no pain. Had taken robitussin and delsym and had thought it was done. PT said she can not make it in for an appointment because she is not able to drive. If anything could be prescribed out she said she could have her daughter pick it up for her. She would like the prescription sent out to the [facility] on [place]: [phone] ?

## 2021-10-22 NOTE — Telephone Encounter (Signed)
Called pt. LVM asking her to give our office a call to schedule a visit. Office number was provided. ? ? ?If pt calls back please schedule the patient a virtual visit with [place] in order for her to receive the correct mediation.  ?

## 2021-10-22 NOTE — Progress Notes (Signed)
? ? ?Virtual telephone visit ? ? ? ?Virtual Visit via Telephone Note  ? ?This visit type was conducted due to national recommendations for restrictions regarding the COVID-19 Pandemic (e.g. social distancing) in an effort to limit this patient's exposure and mitigate transmission in our community. Due to her co-morbid illnesses, this patient is at least at moderate risk for complications without adequate follow up. This format is felt to be most appropriate for this patient at this time. The patient did not have access to video technology or had technical difficulties with video requiring transitioning to audio format only (telephone). Physical exam was limited to content and character of the telephone converstion. CMA was able to get the patient set up on a telephone visit. ? ? ?Patient location: Home. Patient and provider in visit ?Provider location: Office ? ?I discussed the limitations of evaluation and management by telemedicine and the availability of in person appointments. The patient expressed understanding and agreed to proceed.  ? ?Visit Date: 10/22/2021 ? ?Today's healthcare provider: Harland Dingwall, NP-C  ? ? ? ?Subjective:  ? ? Patient ID: Deanna Olson, female    DOB: 01-05-1926, 88 y.o.   MRN: [id number] ? ?Chief Complaint  ?Patient presents with  ? Cough  ?  Productive cough with white sputum since Monday. Coughing has been keeping her up at night. Has been trying Delsym and Robitussin.  ? Sore Throat  ?  Started today  ? ? ?HPI ? ?Complains of a 4 day history of worsening cough productive of white sputum. Cough is keeping her awake.  ?Developed a sore throat this morning.  ? ?No fever, chills, body aches, dizziness, headache, chest pain, palpitations, shortness of breath, abdominal pain, N/V/D or LE edema.  ? ?Her daughter bought her Delsym and she started taking it today.  ? ? ? ?Past Medical History:  ?Diagnosis Date  ? Allergic rhinitis, cause unspecified   ? Anxiety state, unspecified   ? Aortic  valve disorders   ? Mild AS 2013 echo   ? Eczema   ? Esophageal reflux   ? Herpes zoster without mention of complication   ? Hyperlipidemia   ? Hypertension   ? Hypothyroid   ? Osteoarthrosis, unspecified whether generalized or localized, unspecified site   ? Unspecified venous (peripheral) insufficiency   ? ? ?Past Surgical History:  ?Procedure Laterality Date  ? CATARACT EXTRACTION, BILATERAL  2013  ? KYPHOPLASTY  02/2018  ? VESICOVAGINAL FISTULA CLOSURE W/ TAH    ? ? ?Family History  ?Problem Relation Age of Onset  ? Stroke Mother 52  ? Diabetes Mother   ? Diabetes Brother   ? Heart attack Brother 59  ?     s/p open heart surg  ? CAD Brother 67  ? Cancer Sister   ?     4 types  ? ? ?Social History  ? ?Socioeconomic History  ? Marital status: Widowed  ?  Spouse name: (wilbur Santiesteban)  ? Number of children: 2  ? Years of education: Not on file  ? Highest education level: Not on file  ?Occupational History  ? Occupation: retired  ?Tobacco Use  ? Smoking status: Never  ? Smokeless tobacco: Never  ?Vaping Use  ? Vaping Use: Never used  ?Substance and Sexual Activity  ? Alcohol use: Never  ?  Alcohol/week: 0.0 standard drinks  ? Drug use: Never  ? Sexual activity: Never  ?Other Topics Concern  ? Not on file  ?Social History Narrative  ? Not  on file  ? ?Social Determinants of Health  ? ?Financial Resource Strain: Low Risk   ? Difficulty of Paying Living Expenses: Not hard at all  ?Food Insecurity: No Food Insecurity  ? Worried About Charity fundraiser in the Last Year: Never true  ? Ran Out of Food in the Last Year: Never true  ?Transportation Needs: No Transportation Needs  ? Lack of Transportation (Medical): No  ? Lack of Transportation (Non-Medical): No  ?Physical Activity: Sufficiently Active  ? Days of Exercise per Week: 5 days  ? Minutes of Exercise per Session: 30 min  ?Stress: No Stress Concern Present  ? Feeling of Stress : Not at all  ?Social Connections: Moderately Integrated  ? Frequency of Communication  with Friends and Family: More than three times a week  ? Frequency of Social Gatherings with Friends and Family: More than three times a week  ? Attends Religious Services: More than 4 times per year  ? Active Member of Clubs or Organizations: Yes  ? Attends Archivist Meetings: More than 4 times per year  ? Marital Status: Widowed  ?Intimate Partner Violence: Not At Risk  ? Fear of Current or Ex-Partner: No  ? Emotionally Abused: No  ? Physically Abused: No  ? Sexually Abused: No  ? ? ?Outpatient Medications Prior to Visit  ?Medication Sig Dispense Refill  ? acetaminophen (TYLENOL) 500 MG tablet Take 500 mg by mouth daily as needed for moderate pain.    ? alendronate (FOSAMAX) 70 MG tablet Take 1 tablet (70 mg total) by mouth once a week. Take with a full glass of water on an empty stomach. 12 tablet 3  ? ALPRAZolam (XANAX) 0.5 MG tablet TAKE ONE TABLET BY MOUTH TWICE A DAY (Patient taking differently: Take 0.5 mg by mouth at bedtime.) 60 tablet 3  ? b complex vitamins capsule Take 1 capsule by mouth daily.    ? betamethasone dipropionate 0.05 % lotion Apply topically daily as needed. Uses on scalp and ears 60 mL 2  ? Cholecalciferol (VITAMIN D3 PO) Take 2,000 Units by mouth daily.    ? diclofenac sodium (VOLTAREN) 1 % GEL Apply 2 g topically as needed.    ? diphenhydramine-acetaminophen (TYLENOL PM) 25-500 MG TABS tablet Take 1 tablet by mouth at bedtime as needed.    ? gabapentin (NEURONTIN) 300 MG capsule Take 1 capsule (300 mg total) by mouth at bedtime. 90 capsule 1  ? Glucos-Chond-MSM-Bor-D3-Hyalur (MOVE FREE JOINT HEALTH ADV + D) TABS Take 1 tablet by mouth daily.    ? ketoconazole (NIZORAL) 2 % shampoo USE TO SHAMPOO TWICE WEEKLY 100 mL 0  ? levothyroxine (SYNTHROID) 50 MCG tablet Take 1 tablet (50 mcg total) by mouth daily before breakfast. VISIT OVERDUE!!!! 30 tablet 0  ? Lidocaine 4 % PTCH Apply 1 patch topically daily.    ? lisinopril (ZESTRIL) 20 MG tablet TAKE ONE TABLET BY MOUTH DAILY 90  tablet 1  ? Multiple Minerals-Vitamins (CALCIUM-MAGNESIUM-ZINC-D3 PO) Take 3 tablets by mouth. Calcium '1000mg'$  , Magnesium '400mg'$ , Zing '15mg'$  , D3 400 units (in 1 serving)  ?1 serving = 3 tables    ? Multiple Vitamins-Minerals (PRESERVISION AREDS 2+MULTI VIT PO) Take 1 capsule by mouth daily.    ? Omega-3 Fatty Acids (OMEGA-3 2100 PO) Take 1 capsule by mouth.    ? omeprazole (PRILOSEC) 20 MG capsule TAKE ONE CAPSULE BY MOUTH DAILY ON AN EMPTY STOMACH 90 capsule 1  ? Polyethyl Glycol-Propyl Glycol 0.4-0.3 % SOLN Apply  1 drop to eye daily as needed.    ? POTASSIUM GLUCONATE PO Take 1 tablet by mouth daily.    ? pravastatin (PRAVACHOL) 20 MG tablet TAKE ONE TABLET BY MOUTH DAILY 90 tablet 1  ? triamcinolone cream (KENALOG) 0.1 % Apply topically 2 (two) times daily. 80 g 11  ? vitamin C (ASCORBIC ACID) 500 MG tablet Take 500 mg by mouth daily.    ? Xylitol (XYLIMELTS MT) Use as directed 1 tablet in the mouth or throat at bedtime.    ? ?No facility-administered medications prior to visit.  ? ? ?Allergies  ?Allergen Reactions  ? Sulfonamide Derivatives   ?  REACTION: angioedema  ? ? ?ROS ?Pertinent positives and negatives in the history of present illness. ? ?   ?Objective:  ?  ?Physical Exam ? ?There were no vitals taken for this visit. ?Wt Readings from Last 3 Encounters:  ?02/13/21 157 lb (71.2 kg)  ?08/14/20 155 lb 6.4 oz (70.5 kg)  ?02/05/20 161 lb (73 kg)  ? ?Speaking in complete sentences without difficulty. ? ?   ?Assessment & Plan:  ? ?Problem List Items Addressed This Visit   ? ?  ? Other  ? Cough - Primary  ? Relevant Medications  ? benzonatate (TESSALON) 200 MG capsule  ? ?Other Visit Diagnoses   ? ? Acute pharyngitis, unspecified etiology      ? ?  ? ?We discussed taking a COVID test which she has at home.  She will let us know if it is positive. ?She appears to have a viral illness.  Tessalon prescribed.  She may take over-the-counter Delsym.  Also recommended salt water gargles and Tylenol for pain.  Follow-up  if worsening ? ?I am having Jayelyn Hone start on benzonatate. I am also having her maintain her vitamin C, acetaminophen, Cholecalciferol (VITAMIN D3 PO), b complex vitamins, diphenhydramine-acetaminophen

## 2021-10-22 NOTE — Telephone Encounter (Signed)
Okay to schedule virtual ?

## 2021-10-24 ENCOUNTER — Other Ambulatory Visit: Payer: Self-pay | Admitting: Internal Medicine

## 2021-10-26 DIAGNOSIS — L218 Other seborrheic dermatitis: Secondary | ICD-10-CM | POA: Diagnosis not present

## 2021-10-26 DIAGNOSIS — L570 Actinic keratosis: Secondary | ICD-10-CM | POA: Diagnosis not present

## 2021-10-29 ENCOUNTER — Telehealth: Payer: Self-pay | Admitting: Internal Medicine

## 2021-10-29 NOTE — Telephone Encounter (Signed)
Pt called in and states she was rx benzonatate (TESSALON) 200 MG capsule but thinks she needs something stronger. No fever. Cough is still really bad, and this medication is making her drowsy.   If something else is called in, please contact the pt.   [facility] [id number] [place], [place] Phone:  [phone]  Fax:  [phone]

## 2021-10-30 MED ORDER — GUAIFENESIN-DM 100-10 MG/5ML PO SYRP: 5.0000 mL | ORAL_SOLUTION | ORAL | 0 refills | Status: AC | PRN

## 2021-10-30 NOTE — Telephone Encounter (Signed)
Patient notified

## 2021-10-30 NOTE — Telephone Encounter (Signed)
Ok for robitussin DM -= done erx 

## 2021-10-30 NOTE — Telephone Encounter (Signed)
Patients daughter called back and said that her mom really needs something else sent in to get her through the weekend.  She does not know if she needs an antibiotic or just something stronger for cough.  Please advise

## 2021-11-02 ENCOUNTER — Ambulatory Visit (INDEPENDENT_AMBULATORY_CARE_PROVIDER_SITE_OTHER): Payer: PPO | Admitting: Internal Medicine

## 2021-11-02 ENCOUNTER — Encounter: Payer: Self-pay | Admitting: Internal Medicine

## 2021-11-02 VITALS — BP 146/88 | HR 74 | Temp 98.9°F | Ht 64.0 in | Wt 152.0 lb

## 2021-11-02 DIAGNOSIS — R062 Wheezing: Secondary | ICD-10-CM | POA: Insufficient documentation

## 2021-11-02 DIAGNOSIS — R051 Acute cough: Secondary | ICD-10-CM | POA: Diagnosis not present

## 2021-11-02 DIAGNOSIS — I10 Essential (primary) hypertension: Secondary | ICD-10-CM

## 2021-11-02 MED ORDER — HYDROCODONE BIT-HOMATROP MBR 5-1.5 MG/5ML PO SOLN: 5.0000 mL | Freq: Four times a day (QID) | ORAL | 0 refills | Status: AC | PRN

## 2021-11-02 MED ORDER — DOXYCYCLINE HYCLATE 100 MG PO TABS: 100.0000 mg | ORAL_TABLET | Freq: Two times a day (BID) | ORAL | 0 refills | Status: DC

## 2021-11-02 MED ORDER — ALBUTEROL SULFATE HFA 108 (90 BASE) MCG/ACT IN AERS: 2.0000 | INHALATION_SPRAY | Freq: Four times a day (QID) | RESPIRATORY_TRACT | 0 refills | Status: DC | PRN

## 2021-11-02 MED ORDER — PREDNISONE 10 MG PO TABS: ORAL_TABLET | ORAL | 0 refills | Status: DC

## 2021-11-02 NOTE — Assessment & Plan Note (Signed)
BP Readings from Last 3 Encounters:  11/02/21 (!) 146/88  02/13/21 140/69  08/14/20 132/70   Mild uncontrolled today , likely situational,, pt to continue medical treatment lisinopril 20 for now

## 2021-11-02 NOTE — Patient Instructions (Signed)
Please take all new medication as prescribed - the antibiotic, cough medicine, prednisone, and inhaler as needed  Please continue all other medications as before, and refills have been done if requested.  Please have the pharmacy call with any other refills you may need.  Please keep your appointments with your specialists as you may have planned   

## 2021-11-02 NOTE — Assessment & Plan Note (Signed)
Mild with mild sob, for short course low dose prednisone, albuterol hfa prn, to f/u any worsening symptoms or concerns

## 2021-11-02 NOTE — Assessment & Plan Note (Signed)
Exam c/w primarily URI symptoms now with worsening scant prod cough - will hold on cxr for now, but for antibx, cough med prn

## 2021-11-02 NOTE — Progress Notes (Signed)
Patient ID: Deanna Olson, female   DOB: [birth year], 88 y.o.   MRN: [id number]        Chief Complaint: follow up cough, wheezing, elevated BP       HPI:  Deanna Olson is a 88 y.o. female here with daughter with 2 wks URI symptoms including fever, pain, pressure, headache, general weakness and malaise, and d/c, with mild ST and scant prod cough, but pt denies chest pain, orthopnea, PND, increased LE swelling, palpitations, dizziness or syncope until wheezing start last pm with mild sob.  BP usually better controlled   Wt Readings from Last 3 Encounters:  11/02/21 152 lb (68.9 kg)  02/13/21 157 lb (71.2 kg)  08/14/20 155 lb 6.4 oz (70.5 kg)   BP Readings from Last 3 Encounters:  11/02/21 (!) 146/88  02/13/21 140/69  08/14/20 132/70         Past Medical History:  Diagnosis Date   Allergic rhinitis, cause unspecified    Anxiety state, unspecified    Aortic valve disorders    Mild AS 2013 echo    Eczema    Esophageal reflux    Herpes zoster without mention of complication    Hyperlipidemia    Hypertension    Hypothyroid    Osteoarthrosis, unspecified whether generalized or localized, unspecified site    Unspecified venous (peripheral) insufficiency    Past Surgical History:  Procedure Laterality Date   CATARACT EXTRACTION, BILATERAL  2013   KYPHOPLASTY  02/2018   VESICOVAGINAL FISTULA CLOSURE W/ TAH      reports that she has never smoked. She has never used smokeless tobacco. She reports that she does not drink alcohol and does not use drugs. family history includes CAD (age of onset: 44) in her brother; Cancer in her sister; Diabetes in her brother and mother; Heart attack (age of onset: 72) in her brother; Stroke (age of onset: 16) in her mother. Allergies  Allergen Reactions   Sulfonamide Derivatives     REACTION: angioedema   Current Outpatient Medications on File Prior to Visit  Medication Sig Dispense Refill   acetaminophen (TYLENOL) 500 MG tablet Take 500 mg by mouth  daily as needed for moderate pain.     alendronate (FOSAMAX) 70 MG tablet Take 1 tablet (70 mg total) by mouth once a week. Take with a full glass of water on an empty stomach. 12 tablet 3   ALPRAZolam (XANAX) 0.5 MG tablet TAKE ONE TABLET BY MOUTH TWICE A DAY (Patient taking differently: Take 0.5 mg by mouth at bedtime.) 60 tablet 3   b complex vitamins capsule Take 1 capsule by mouth daily.     benzonatate (TESSALON) 200 MG capsule Take 1 capsule (200 mg total) by mouth 2 (two) times daily as needed for cough. 20 capsule 0   betamethasone dipropionate 0.05 % lotion Apply topically daily as needed. Uses on scalp and ears 60 mL 2   Cholecalciferol (VITAMIN D3 PO) Take 2,000 Units by mouth daily.     diclofenac sodium (VOLTAREN) 1 % GEL Apply 2 g topically as needed.     diphenhydramine-acetaminophen (TYLENOL PM) 25-500 MG TABS tablet Take 1 tablet by mouth at bedtime as needed.     gabapentin (NEURONTIN) 300 MG capsule Take 1 capsule (300 mg total) by mouth at bedtime. 90 capsule 1   Glucos-Chond-MSM-Bor-D3-Hyalur (MOVE FREE JOINT HEALTH ADV + D) TABS Take 1 tablet by mouth daily.     guaiFENesin-dextromethorphan (ROBITUSSIN DM) 100-10 MG/5ML syrup Take 5 mLs by mouth  every 4 (four) hours as needed for cough. 118 mL 0   ketoconazole (NIZORAL) 2 % shampoo USE TO SHAMPOO TWICE WEEKLY 100 mL 0   levothyroxine (SYNTHROID) 50 MCG tablet TAKE ONE TABLET BY MOUTH DAILY BEFORE BREAKFAST  **VISIT OVERDUE** 30 tablet 0   Lidocaine 4 % PTCH Apply 1 patch topically daily.     lisinopril (ZESTRIL) 20 MG tablet TAKE ONE TABLET BY MOUTH DAILY 90 tablet 1   Multiple Minerals-Vitamins (CALCIUM-MAGNESIUM-ZINC-D3 PO) Take 3 tablets by mouth. Calcium '1000mg'$  , Magnesium '400mg'$ , Zing '15mg'$  , D3 400 units (in 1 serving)  1 serving = 3 tables     Multiple Vitamins-Minerals (PRESERVISION AREDS 2+MULTI VIT PO) Take 1 capsule by mouth daily.     Omega-3 Fatty Acids (OMEGA-3 2100 PO) Take 1 capsule by mouth.     omeprazole  (PRILOSEC) 20 MG capsule TAKE ONE CAPSULE BY MOUTH DAILY ON AN EMPTY STOMACH 90 capsule 1   Polyethyl Glycol-Propyl Glycol 0.4-0.3 % SOLN Apply 1 drop to eye daily as needed.     POTASSIUM GLUCONATE PO Take 1 tablet by mouth daily.     pravastatin (PRAVACHOL) 20 MG tablet TAKE ONE TABLET BY MOUTH DAILY 90 tablet 1   triamcinolone cream (KENALOG) 0.1 % Apply topically 2 (two) times daily. 80 g 11   vitamin C (ASCORBIC ACID) 500 MG tablet Take 500 mg by mouth daily.     Xylitol (XYLIMELTS MT) Use as directed 1 tablet in the mouth or throat at bedtime.     No current facility-administered medications on file prior to visit.        ROS:  All others reviewed and negative.  Objective        PE:  BP (!) 146/88 (BP Location: Left Arm, Patient Position: Sitting, Cuff Size: Large)   Pulse 74   Temp 98.9 F (37.2 C) (Oral)   Ht '5\' 4"'$  (1.626 m)   Wt 152 lb (68.9 kg)   SpO2 94%   BMI 26.09 kg/m                 Constitutional: Pt appears in NAD               HENT: Head: NCAT.                Right Ear: External ear normal.                 Left Ear: External ear normal. Bilat tm's with mild erythema.  Max sinus areas mild tender.  Pharynx with mild erythema, no exudate               Eyes: . Pupils are equal, round, and reactive to light. Conjunctivae and EOM are normal               Nose: without d/c or deformity               Neck: Neck supple. Gross normal ROM               Cardiovascular: Normal rate and regular rhythm.                 Pulmonary/Chest: Effort normal and breath sounds without rales but with few bilateral wheezing.                               Neurological: Pt is alert. At baseline orientation, motor grossly intact  Skin: Skin is warm. No rashes, no other new lesions, LE edema - none               Psychiatric: Pt behavior is normal without agitation   Micro: none  Cardiac tracings I have personally interpreted today:  none  Pertinent Radiological findings  (summarize): none   Lab Results  Component Value Date   WBC 6.8 08/14/2020   HGB 12.4 08/14/2020   HCT 37.0 08/14/2020   PLT 191.0 08/14/2020   GLUCOSE 100 (H) 08/14/2020   CHOL 237 (H) 08/14/2020   TRIG 347.0 (H) 08/14/2020   HDL 47.40 08/14/2020   LDLDIRECT 161.0 08/14/2020   LDLCALC 151 (H) 10/20/2016   ALT 12 08/14/2020   AST 20 08/14/2020   NA 138 08/14/2020   K 4.9 08/14/2020   CL 106 08/14/2020   CREATININE 1.00 08/14/2020   BUN 26 (H) 08/14/2020   CO2 27 08/14/2020   TSH 2.82 08/14/2020   HGBA1C 5.2 05/18/2019   Assessment/Plan:  Natalyah Cummiskey is a 88 y.o. White or Caucasian [1] female with  has a past medical history of Allergic rhinitis, cause unspecified, Anxiety state, unspecified, Aortic valve disorders, Eczema, Esophageal reflux, Herpes zoster without mention of complication, Hyperlipidemia, Hypertension, Hypothyroid, Osteoarthrosis, unspecified whether generalized or localized, unspecified site, and Unspecified venous (peripheral) insufficiency.  Cough Exam c/w primarily URI symptoms now with worsening scant prod cough - will hold on cxr for now, but for antibx, cough med prn  Wheezing Mild with mild sob, for short course low dose prednisone, albuterol hfa prn, to f/u any worsening symptoms or concerns  Essential hypertension BP Readings from Last 3 Encounters:  11/02/21 (!) 146/88  02/13/21 140/69  08/14/20 132/70   Mild uncontrolled today , likely situational,, pt to continue medical treatment lisinopril 20 for now  Followup: Return if symptoms worsen or fail to improve.  Cathlean Cower, MD 11/02/2021 7:29 PM [facility]

## 2021-11-21 ENCOUNTER — Other Ambulatory Visit: Payer: Self-pay | Admitting: Internal Medicine

## 2021-12-10 DIAGNOSIS — H353132 Nonexudative age-related macular degeneration, bilateral, intermediate dry stage: Secondary | ICD-10-CM | POA: Diagnosis not present

## 2021-12-22 ENCOUNTER — Other Ambulatory Visit: Payer: Self-pay | Admitting: Internal Medicine

## 2021-12-25 ENCOUNTER — Inpatient Hospital Stay (HOSPITAL_COMMUNITY)
Admission: EM | Admit: 2021-12-25 | Discharge: 2021-12-27 | DRG: 558 | Disposition: A | Discharge status: Home Health | Payer: PPO | Attending: Internal Medicine | Admitting: Internal Medicine

## 2021-12-25 ENCOUNTER — Emergency Department (HOSPITAL_COMMUNITY): Payer: PPO

## 2021-12-25 ENCOUNTER — Encounter (HOSPITAL_COMMUNITY): Payer: Self-pay

## 2021-12-25 DIAGNOSIS — Z8249 Family history of ischemic heart disease and other diseases of the circulatory system: Secondary | ICD-10-CM | POA: Diagnosis not present

## 2021-12-25 DIAGNOSIS — Y92009 Unspecified place in unspecified non-institutional (private) residence as the place of occurrence of the external cause: Secondary | ICD-10-CM

## 2021-12-25 DIAGNOSIS — R0902 Hypoxemia: Secondary | ICD-10-CM | POA: Diagnosis not present

## 2021-12-25 DIAGNOSIS — S4992XA Unspecified injury of left shoulder and upper arm, initial encounter: Secondary | ICD-10-CM | POA: Diagnosis not present

## 2021-12-25 DIAGNOSIS — R000 Tachycardia, unspecified: Secondary | ICD-10-CM | POA: Diagnosis not present

## 2021-12-25 DIAGNOSIS — E860 Dehydration: Secondary | ICD-10-CM | POA: Diagnosis not present

## 2021-12-25 DIAGNOSIS — Z9842 Cataract extraction status, left eye: Secondary | ICD-10-CM

## 2021-12-25 DIAGNOSIS — Z833 Family history of diabetes mellitus: Secondary | ICD-10-CM

## 2021-12-25 DIAGNOSIS — T796XXA Traumatic ischemia of muscle, initial encounter: Secondary | ICD-10-CM | POA: Diagnosis not present

## 2021-12-25 DIAGNOSIS — E782 Mixed hyperlipidemia: Secondary | ICD-10-CM | POA: Diagnosis not present

## 2021-12-25 DIAGNOSIS — W19XXXA Unspecified fall, initial encounter: Secondary | ICD-10-CM

## 2021-12-25 DIAGNOSIS — K219 Gastro-esophageal reflux disease without esophagitis: Secondary | ICD-10-CM | POA: Diagnosis not present

## 2021-12-25 DIAGNOSIS — M6282 Rhabdomyolysis: Principal | ICD-10-CM | POA: Diagnosis present

## 2021-12-25 DIAGNOSIS — I10 Essential (primary) hypertension: Secondary | ICD-10-CM | POA: Diagnosis present

## 2021-12-25 DIAGNOSIS — N1831 Chronic kidney disease, stage 3a: Secondary | ICD-10-CM | POA: Diagnosis present

## 2021-12-25 DIAGNOSIS — F411 Generalized anxiety disorder: Secondary | ICD-10-CM | POA: Diagnosis present

## 2021-12-25 DIAGNOSIS — E039 Hypothyroidism, unspecified: Secondary | ICD-10-CM | POA: Diagnosis not present

## 2021-12-25 DIAGNOSIS — I129 Hypertensive chronic kidney disease with stage 1 through stage 4 chronic kidney disease, or unspecified chronic kidney disease: Secondary | ICD-10-CM | POA: Diagnosis not present

## 2021-12-25 DIAGNOSIS — W010XXA Fall on same level from slipping, tripping and stumbling without subsequent striking against object, initial encounter: Secondary | ICD-10-CM | POA: Diagnosis present

## 2021-12-25 DIAGNOSIS — Z823 Family history of stroke: Secondary | ICD-10-CM | POA: Diagnosis not present

## 2021-12-25 DIAGNOSIS — Z9841 Cataract extraction status, right eye: Secondary | ICD-10-CM | POA: Diagnosis not present

## 2021-12-25 DIAGNOSIS — Y9301 Activity, walking, marching and hiking: Secondary | ICD-10-CM | POA: Diagnosis present

## 2021-12-25 DIAGNOSIS — S43402A Unspecified sprain of left shoulder joint, initial encounter: Secondary | ICD-10-CM | POA: Diagnosis not present

## 2021-12-25 DIAGNOSIS — M79602 Pain in left arm: Secondary | ICD-10-CM | POA: Diagnosis not present

## 2021-12-25 DIAGNOSIS — S40012A Contusion of left shoulder, initial encounter: Secondary | ICD-10-CM | POA: Diagnosis present

## 2021-12-25 LAB — CBC
HCT: 40.5 % (ref 36.0–46.0)
Hemoglobin: 13.2 g/dL (ref 12.0–15.0)
MCH: 30.3 pg (ref 26.0–34.0)
MCHC: 32.6 g/dL (ref 30.0–36.0)
MCV: 93.1 fL (ref 80.0–100.0)
Platelets: 148 10*3/uL — ABNORMAL LOW (ref 150–400)
RBC: 4.35 MIL/uL (ref 3.87–5.11)
RDW: 15.1 % (ref 11.5–15.5)
WBC: 7.5 10*3/uL (ref 4.0–10.5)
nRBC: 0 % (ref 0.0–0.2)

## 2021-12-25 LAB — COMPREHENSIVE METABOLIC PANEL
ALT: 50 U/L — ABNORMAL HIGH (ref 0–44)
AST: 183 U/L — ABNORMAL HIGH (ref 15–41)
Albumin: 3.9 g/dL (ref 3.5–5.0)
Alkaline Phosphatase: 39 U/L (ref 38–126)
Anion gap: 14 (ref 5–15)
BUN: 18 mg/dL (ref 8–23)
CO2: 20 mmol/L — ABNORMAL LOW (ref 22–32)
Calcium: 8.7 mg/dL — ABNORMAL LOW (ref 8.9–10.3)
Chloride: 102 mmol/L (ref 98–111)
Creatinine, Ser: 1.07 mg/dL — ABNORMAL HIGH (ref 0.44–1.00)
GFR, Estimated: 48 mL/min — ABNORMAL LOW (ref >60)
Glucose, Bld: 114 mg/dL — ABNORMAL HIGH (ref 70–99)
Potassium: 4.4 mmol/L (ref 3.5–5.1)
Sodium: 136 mmol/L (ref 135–145)
Total Bilirubin: 0.7 mg/dL (ref 0.3–1.2)
Total Protein: 6.5 g/dL (ref 6.5–8.1)

## 2021-12-25 LAB — CK: Total CK: 6114 U/L — ABNORMAL HIGH (ref 38–234)

## 2021-12-25 MED ORDER — SODIUM BICARBONATE 8.4 % IV SOLN
Freq: Once | INTRAVENOUS | Status: AC
  Filled 2021-12-25: qty 1000

## 2021-12-25 MED ORDER — SODIUM BICARBONATE 8.4 % IV SOLN
Freq: Once | INTRAVENOUS | Status: DC
  Filled 2021-12-25: qty 1000

## 2021-12-25 MED ORDER — SODIUM CHLORIDE 0.9 % IV BOLUS
1000.0000 mL | Freq: Once | INTRAVENOUS | Status: AC
  Administered 2021-12-25: 1000 mL via INTRAVENOUS

## 2021-12-25 MED ORDER — SODIUM CHLORIDE 0.9 % IV SOLN: INTRAVENOUS | Status: DC

## 2021-12-25 MED ORDER — TRAMADOL HCL 50 MG PO TABS
50.0000 mg | ORAL_TABLET | Freq: Once | ORAL | Status: AC
  Administered 2021-12-25: 50 mg via ORAL
  Filled 2021-12-25: qty 1

## 2021-12-25 NOTE — ED Notes (Signed)
Family at bedside updated.  

## 2021-12-25 NOTE — ED Triage Notes (Signed)
Pt comes from home, found in floor by daughter, fell at 730am , c/o of L shoulder and arm pain. Possible dislocation. Redness to face

## 2021-12-25 NOTE — Discharge Instructions (Signed)
It was our pleasure to provide your ER care today - we hope that you feel better.  Fall precautions.  Use great care/caution and walker  to help minimize risk of falling.   No fracture was seen on xrays, although there is the possibility of soft tissue or rotator cuff injury. Wear sling for comfort/support for the next few days. Ice/coldpack to sore area.  Take acetaminophen as need.   Follow up with primary care doctor this coming week.  Return to ER if worse, new symptoms, new/severe pain, fevers, chest pain, trouble breathing, weak/fainting, or other concern.

## 2021-12-25 NOTE — ED Notes (Signed)
Pt ambulated with walker, pt complained of pain. Pt needed assistance due to injured arm.

## 2021-12-25 NOTE — ED Notes (Signed)
Received verbal report from Bailey B RN at this time 

## 2021-12-25 NOTE — ED Provider Notes (Signed)
[facility] EMERGENCY DEPARTMENT Provider Note   CSN: [id number] Arrival date & time: 12/25/21  1904     History  Chief Complaint  Patient presents with   Fall   Shoulder Injury    Deanna Olson is a 88 y.o. female.  Pt s/p fall at home earlier today. Indicates feels as thought she tripped, falling towards left side, c/o left shoulder and upper arm pain post fall. Denies faintness or dizziness prior to fall. No head injury or loc w fall. No headache. No neck or back pain. Denies lower extremity pain or other injury. Skin intact. No anticoag use.   The history is provided by the patient, the EMS personnel and medical records.       Home Medications Prior to Admission medications   Medication Sig Start Date End Date Taking? Authorizing Provider  acetaminophen (TYLENOL) 500 MG tablet Take 500 mg by mouth daily as needed for moderate pain.    [provider]  albuterol (VENTOLIN HFA) 108 (90 Base) MCG/ACT inhaler Inhale 2 puffs into the lungs every 6 (six) hours as needed for wheezing or shortness of breath. 11/02/21   Biagio Borg, MD  alendronate (FOSAMAX) 70 MG tablet Take 1 tablet (70 mg total) by mouth once a week. Take with a full glass of water on an empty stomach. 10/13/21   Hoyt Koch, MD  ALPRAZolam [name]) 0.5 MG tablet TAKE ONE TABLET BY MOUTH TWICE A DAY Patient taking differently: Take 0.5 mg by mouth at bedtime. 05/11/21   Hoyt Koch, MD  b complex vitamins capsule Take 1 capsule by mouth daily.    [provider]  benzonatate (TESSALON) 200 MG capsule Take 1 capsule (200 mg total) by mouth 2 (two) times daily as needed for cough. 10/22/21   Henson, Vickie L, NP-C  betamethasone dipropionate 0.05 % lotion Apply topically daily as needed. Uses on scalp and ears 10/13/21   Hoyt Koch, MD  Cholecalciferol (VITAMIN D3 PO) Take 2,000 Units by mouth daily.    [provider]  diclofenac sodium (VOLTAREN)  1 % GEL Apply 2 g topically as needed.    [provider]  diphenhydramine-acetaminophen (TYLENOL PM) 25-500 MG TABS tablet Take 1 tablet by mouth at bedtime as needed.    [provider]  doxycycline (VIBRA-TABS) 100 MG tablet Take 1 tablet (100 mg total) by mouth 2 (two) times daily. 11/02/21   Biagio Borg, MD  gabapentin (NEURONTIN) 300 MG capsule TAKE ONE CAPSULE BY MOUTH EVERY NIGHT AT BEDTIME 12/22/21   Hoyt Koch, MD  Glucos-Chond-MSM-Bor-D3-Hyalur (MOVE FREE JOINT HEALTH ADV + D) TABS Take 1 tablet by mouth daily.    [provider]  guaiFENesin-dextromethorphan (ROBITUSSIN DM) 100-10 MG/5ML syrup Take 5 mLs by mouth every 4 (four) hours as needed for cough. 10/30/21   Biagio Borg, MD  ketoconazole (NIZORAL) 2 % shampoo USE TO SHAMPOO TWICE WEEKLY 10/15/21   Hoyt Koch, MD  levothyroxine (SYNTHROID) 50 MCG tablet TAKE 1 TABLET BY MOUTH DAILY BEFORE BREAKFAST--OVERDUE OFFICE VISIT 12/22/21   Hoyt Koch, MD  Lidocaine 4 % PTCH Apply 1 patch topically daily.    [provider]  lisinopril (ZESTRIL) 20 MG tablet TAKE ONE TABLET BY MOUTH DAILY 09/03/21   Hoyt Koch, MD  Multiple Minerals-Vitamins (CALCIUM-MAGNESIUM-ZINC-D3 PO) Take 3 tablets by mouth. Calcium '1000mg'$  , Magnesium '400mg'$ , Zing '15mg'$  , D3 400 units (in 1 serving)  1 serving = 3 tables  [provider]  Multiple Vitamins-Minerals (PRESERVISION AREDS 2+MULTI VIT PO) Take 1 capsule by mouth daily.    [provider]  Omega-3 Fatty Acids (OMEGA-3 2100 PO) Take 1 capsule by mouth.    [provider]  omeprazole (PRILOSEC) 20 MG capsule TAKE ONE CAPSULE BY MOUTH DAILY ON AN EMPTY STOMACH 09/10/21   Hoyt Koch, MD  Polyethyl Glycol-Propyl Glycol 0.4-0.3 % SOLN Apply 1 drop to eye daily as needed.    [provider]  POTASSIUM GLUCONATE PO Take 1 tablet by mouth daily.    [provider]  pravastatin (PRAVACHOL)  20 MG tablet TAKE ONE TABLET BY MOUTH DAILY 09/03/21   Hoyt Koch, MD  predniSONE (DELTASONE) 10 MG tablet 2 tabs by mouth per day for 5 days 11/02/21   Biagio Borg, MD  triamcinolone cream (KENALOG) 0.1 % Apply topically 2 (two) times daily. 10/13/21   Hoyt Koch, MD  vitamin C (ASCORBIC ACID) 500 MG tablet Take 500 mg by mouth daily.    [provider]  Xylitol (XYLIMELTS MT) Use as directed 1 tablet in the mouth or throat at bedtime.    [provider]      Allergies    Sulfonamide derivatives    Review of Systems   Review of Systems  Constitutional:  Negative for fever.  HENT:  Negative for nosebleeds.   Eyes:  Negative for pain and visual disturbance.  Respiratory:  Negative for shortness of breath.   Cardiovascular:  Negative for chest pain.  Gastrointestinal:  Negative for abdominal pain, nausea and vomiting.  Genitourinary:  Negative for dysuria and flank pain.  Musculoskeletal:  Negative for back pain and neck pain.  Skin:  Negative for wound.  Neurological:  Negative for weakness, numbness and headaches.  Hematological:  Does not bruise/bleed easily.    Physical Exam Updated Vital Signs BP 131/79   Pulse 65   Temp 99.5 F (37.5 C) (Rectal)   Resp 18   SpO2 95%  Physical Exam Vitals and nursing note reviewed.  Constitutional:      Appearance: Normal appearance. She is well-developed.  HENT:     Head: Atraumatic.     Nose: Nose normal.     Mouth/Throat:     Mouth: Mucous membranes are moist.  Eyes:     General: No scleral icterus.    Conjunctiva/sclera: Conjunctivae normal.     Pupils: Pupils are equal, round, and reactive to light.  Neck:     Trachea: No tracheal deviation.  Cardiovascular:     Rate and Rhythm: Normal rate and regular rhythm.     Pulses: Normal pulses.     Heart sounds: Normal heart sounds. No murmur heard.    No friction rub. No gallop.  Pulmonary:     Effort: Pulmonary effort is normal. No  respiratory distress.     Breath sounds: Normal breath sounds.  Chest:     Chest wall: No tenderness.  Abdominal:     General: Bowel sounds are normal. There is no distension.     Palpations: Abdomen is soft.     Tenderness: There is no abdominal tenderness.  Genitourinary:    Comments: No cva tenderness.  Musculoskeletal:        General: No swelling.     Cervical back: Normal range of motion and neck supple. No rigidity or tenderness. No muscular tenderness.     Comments: CTLS spine, non tender, aligned, no step off. Tenderness left shoulder and  humerus. Mild soft tissue swelling. Compartments of arm are soft and not tense. Distal pulse palp. Otherwise good rom bil extremities without pain or other focal bony tenderness.   Skin:    General: Skin is warm and dry.     Findings: No rash.  Neurological:     Mental Status: She is alert.     Comments: Alert, speech normal. GCS 15. Motor/sens grossly intact bil.   Psychiatric:        Mood and Affect: Mood normal.     ED Results / Procedures / Treatments   Labs (all labs ordered are listed, but only abnormal results are displayed) Results for orders placed or performed during the hospital encounter of 12/25/21  Comprehensive metabolic panel  Result Value Ref Range   Sodium 136 135 - 145 mmol/L   Potassium 4.4 3.5 - 5.1 mmol/L   Chloride 102 98 - 111 mmol/L   CO2 20 (L) 22 - 32 mmol/L   Glucose, Bld 114 (H) 70 - 99 mg/dL   BUN 18 8 - 23 mg/dL   Creatinine, Ser 1.07 (H) 0.44 - 1.00 mg/dL   Calcium 8.7 (L) 8.9 - 10.3 mg/dL   Total Protein 6.5 6.5 - 8.1 g/dL   Albumin 3.9 3.5 - 5.0 g/dL   AST 183 (H) 15 - 41 U/L   ALT 50 (H) 0 - 44 U/L   Alkaline Phosphatase 39 38 - 126 U/L   Total Bilirubin 0.7 0.3 - 1.2 mg/dL   GFR, Estimated 48 (L) >60 mL/min   Anion gap 14 5 - 15  CBC  Result Value Ref Range   WBC 7.5 4.0 - 10.5 K/uL   RBC 4.35 3.87 - 5.11 MIL/uL   Hemoglobin 13.2 12.0 - 15.0 g/dL   HCT 40.5 36.0 - 46.0 %   MCV 93.1  80.0 - 100.0 fL   MCH 30.3 26.0 - 34.0 pg   MCHC 32.6 30.0 - 36.0 g/dL   RDW 15.1 11.5 - 15.5 %   Platelets 148 (L) 150 - 400 K/uL   nRBC 0.0 0.0 - 0.2 %  CK  Result Value Ref Range   Total CK 6,114 (H) 38 - 234 U/L   DG Humerus Left  Result Date: 12/25/2021 CLINICAL DATA:  Trauma, fall EXAM: LEFT HUMERUS - 2+ VIEW COMPARISON:  None Available. FINDINGS: There is no evidence of fracture or other focal bone lesions. Soft tissues are unremarkable. IMPRESSION: No fracture is seen in the left humerus. Electronically Signed   By: Elmer Picker M.D.   On: 12/25/2021 20:23   DG Shoulder Left  Result Date: 12/25/2021 CLINICAL DATA:  Trauma, fall EXAM: LEFT SHOULDER - 2+ VIEW COMPARISON:  None Available. FINDINGS: No fracture or dislocation is seen. Evaluation is limited without axillary view. Small bony spurs are seen in left AC joint. There are no abnormal soft tissue calcifications. IMPRESSION: No displaced fracture or dislocation is seen in left shoulder. Electronically Signed   By: Elmer Picker M.D.   On: 12/25/2021 20:23      EKG EKG Interpretation  Date/Time:  Friday December 25 2021 19:31:16 EDT Ventricular Rate:  74 PR Interval:  194 QRS Duration: 118 QT Interval:  406 QTC Calculation: 450 R Axis:   -60 Text Interpretation: Normal sinus rhythm Left axis deviation Non-specific intra-ventricular conduction block Non-specific ST-t changes Confirmed by Lajean Saver [phone]) on 12/25/2021 7:42:20 PM  Radiology DG Humerus Left  Result Date: 12/25/2021 CLINICAL DATA:  Trauma, fall EXAM: LEFT HUMERUS -  2+ VIEW COMPARISON:  None Available. FINDINGS: There is no evidence of fracture or other focal bone lesions. Soft tissues are unremarkable. IMPRESSION: No fracture is seen in the left humerus. Electronically Signed   By: Elmer Picker M.D.   On: 12/25/2021 20:23   DG Shoulder Left  Result Date: 12/25/2021 CLINICAL DATA:  Trauma, fall EXAM: LEFT SHOULDER - 2+ VIEW COMPARISON:   None Available. FINDINGS: No fracture or dislocation is seen. Evaluation is limited without axillary view. Small bony spurs are seen in left AC joint. There are no abnormal soft tissue calcifications. IMPRESSION: No displaced fracture or dislocation is seen in left shoulder. Electronically Signed   By: Elmer Picker M.D.   On: 12/25/2021 20:23    Procedures Procedures    Medications Ordered in ED Medications  sodium bicarbonate 150 mEq in dextrose 5 % 1,150 mL infusion (has no administration in time range)  traMADol (ULTRAM) tablet 50 mg (50 mg Oral Given 12/25/21 2104)  sodium chloride 0.9 % bolus 1,000 mL (0 mLs Intravenous Stopped 12/25/21 2314)    ED Course/ Medical Decision Making/ A&P                           Medical Decision Making Problems Addressed: Contusion of left shoulder, initial encounter: acute illness or injury Dehydration: acute illness or injury with systemic symptoms Fall from slip, trip, or stumble, initial encounter: acute illness or injury with systemic symptoms that poses a threat to life or bodily functions Sprain of left shoulder, unspecified shoulder sprain type, initial encounter: acute illness or injury Traumatic rhabdomyolysis, initial encounter [facility]): acute illness or injury with systemic symptoms that poses a threat to life or bodily functions  Amount and/or Complexity of Data Reviewed Independent Historian:     Details: family, hx External Data Reviewed: notes. Labs: ordered. Decision-making details documented in ED Course. Radiology: ordered and independent interpretation performed. Decision-making details documented in ED Course. ECG/medicine tests: ordered and independent interpretation performed. Decision-making details documented in ED Course. Discussion of management or test interpretation with external provider(s): Admitting medicine team.  Risk Prescription drug management. Decision regarding hospitalization.   Iv ns. Continuous  pulse ox and cardiac monitoring. Labs ordered/sent. Imaging ordered.   Reviewed nursing notes and prior charts for additional history. External reports reviewed. Additional history from: family.   Cardiac monitor: sinus rhythm, rate 70.  Reviewed nursing notes and prior charts for additional history. Additional hx from EMS.  Xrays reviewed/interpreted by me - no fx.  Po fluids/food.   Family subsequently arrives, notes pt had been on floor for many hours today - pt was able to get into other room, lie on side, but remained on floor until family member came over this evening.  Will give ivf, check labs.   Labs reviewed/interpreted by me - hco3 sl low ?dehydration. Ck is very high. Ivf bolus. Hco3 infusion.  UA pending.   Will consult hospitalists for admission.         Final Clinical Impression(s) / ED Diagnoses Final diagnoses:  Fall from slip, trip, or stumble, initial encounter  Contusion of left shoulder, initial encounter  Sprain of left shoulder, unspecified shoulder sprain type, initial encounter  Traumatic rhabdomyolysis, initial encounter [facility])  Dehydration    Rx / DC Orders ED Discharge Orders     None         Lajean Saver, MD 12/25/21 2329

## 2021-12-26 ENCOUNTER — Encounter (HOSPITAL_COMMUNITY): Payer: Self-pay | Admitting: Internal Medicine

## 2021-12-26 ENCOUNTER — Other Ambulatory Visit: Payer: Self-pay

## 2021-12-26 DIAGNOSIS — Z9841 Cataract extraction status, right eye: Secondary | ICD-10-CM | POA: Diagnosis not present

## 2021-12-26 DIAGNOSIS — W19XXXA Unspecified fall, initial encounter: Secondary | ICD-10-CM

## 2021-12-26 DIAGNOSIS — E782 Mixed hyperlipidemia: Secondary | ICD-10-CM | POA: Diagnosis present

## 2021-12-26 DIAGNOSIS — Z8249 Family history of ischemic heart disease and other diseases of the circulatory system: Secondary | ICD-10-CM | POA: Diagnosis not present

## 2021-12-26 DIAGNOSIS — Z823 Family history of stroke: Secondary | ICD-10-CM | POA: Diagnosis not present

## 2021-12-26 DIAGNOSIS — T796XXA Traumatic ischemia of muscle, initial encounter: Secondary | ICD-10-CM

## 2021-12-26 DIAGNOSIS — M6282 Rhabdomyolysis: Secondary | ICD-10-CM | POA: Diagnosis not present

## 2021-12-26 DIAGNOSIS — K219 Gastro-esophageal reflux disease without esophagitis: Secondary | ICD-10-CM

## 2021-12-26 DIAGNOSIS — Y9301 Activity, walking, marching and hiking: Secondary | ICD-10-CM | POA: Diagnosis present

## 2021-12-26 DIAGNOSIS — E039 Hypothyroidism, unspecified: Secondary | ICD-10-CM | POA: Diagnosis not present

## 2021-12-26 DIAGNOSIS — W010XXA Fall on same level from slipping, tripping and stumbling without subsequent striking against object, initial encounter: Secondary | ICD-10-CM | POA: Diagnosis present

## 2021-12-26 DIAGNOSIS — Z833 Family history of diabetes mellitus: Secondary | ICD-10-CM | POA: Diagnosis not present

## 2021-12-26 DIAGNOSIS — Z9842 Cataract extraction status, left eye: Secondary | ICD-10-CM | POA: Diagnosis not present

## 2021-12-26 DIAGNOSIS — I10 Essential (primary) hypertension: Secondary | ICD-10-CM

## 2021-12-26 DIAGNOSIS — Y92009 Unspecified place in unspecified non-institutional (private) residence as the place of occurrence of the external cause: Secondary | ICD-10-CM

## 2021-12-26 DIAGNOSIS — I129 Hypertensive chronic kidney disease with stage 1 through stage 4 chronic kidney disease, or unspecified chronic kidney disease: Secondary | ICD-10-CM | POA: Diagnosis not present

## 2021-12-26 DIAGNOSIS — N1831 Chronic kidney disease, stage 3a: Secondary | ICD-10-CM

## 2021-12-26 DIAGNOSIS — F411 Generalized anxiety disorder: Secondary | ICD-10-CM | POA: Diagnosis not present

## 2021-12-26 DIAGNOSIS — S40012A Contusion of left shoulder, initial encounter: Secondary | ICD-10-CM | POA: Diagnosis not present

## 2021-12-26 DIAGNOSIS — S43402A Unspecified sprain of left shoulder joint, initial encounter: Secondary | ICD-10-CM | POA: Diagnosis not present

## 2021-12-26 DIAGNOSIS — E860 Dehydration: Secondary | ICD-10-CM | POA: Diagnosis not present

## 2021-12-26 LAB — CBC WITH DIFFERENTIAL/PLATELET
Abs Immature Granulocytes: 0.04 10*3/uL (ref 0.00–0.07)
Basophils Absolute: 0.1 10*3/uL (ref 0.0–0.1)
Basophils Relative: 1 %
Eosinophils Absolute: 0 10*3/uL (ref 0.0–0.5)
Eosinophils Relative: 0 %
HCT: 37.2 % (ref 36.0–46.0)
Hemoglobin: 11.9 g/dL — ABNORMAL LOW (ref 12.0–15.0)
Immature Granulocytes: 1 %
Lymphocytes Relative: 20 %
Lymphs Abs: 1.2 10*3/uL (ref 0.7–4.0)
MCH: 30.5 pg (ref 26.0–34.0)
MCHC: 32 g/dL (ref 30.0–36.0)
MCV: 95.4 fL (ref 80.0–100.0)
Monocytes Absolute: 0.8 10*3/uL (ref 0.1–1.0)
Monocytes Relative: 13 %
Neutro Abs: 3.9 10*3/uL (ref 1.7–7.7)
Neutrophils Relative %: 65 %
Platelets: 134 10*3/uL — ABNORMAL LOW (ref 150–400)
RBC: 3.9 MIL/uL (ref 3.87–5.11)
RDW: 15.4 % (ref 11.5–15.5)
WBC: 6 10*3/uL (ref 4.0–10.5)
nRBC: 0 % (ref 0.0–0.2)

## 2021-12-26 LAB — COMPREHENSIVE METABOLIC PANEL
ALT: 60 U/L — ABNORMAL HIGH (ref 0–44)
AST: 225 U/L — ABNORMAL HIGH (ref 15–41)
Albumin: 3.1 g/dL — ABNORMAL LOW (ref 3.5–5.0)
Alkaline Phosphatase: 34 U/L — ABNORMAL LOW (ref 38–126)
Anion gap: 11 (ref 5–15)
BUN: 17 mg/dL (ref 8–23)
CO2: 24 mmol/L (ref 22–32)
Calcium: 8.2 mg/dL — ABNORMAL LOW (ref 8.9–10.3)
Chloride: 104 mmol/L (ref 98–111)
Creatinine, Ser: 1.03 mg/dL — ABNORMAL HIGH (ref 0.44–1.00)
GFR, Estimated: 51 mL/min — ABNORMAL LOW (ref >60)
Glucose, Bld: 120 mg/dL — ABNORMAL HIGH (ref 70–99)
Potassium: 3.4 mmol/L — ABNORMAL LOW (ref 3.5–5.1)
Sodium: 139 mmol/L (ref 135–145)
Total Bilirubin: 0.5 mg/dL (ref 0.3–1.2)
Total Protein: 5.9 g/dL — ABNORMAL LOW (ref 6.5–8.1)

## 2021-12-26 LAB — URINALYSIS, ROUTINE W REFLEX MICROSCOPIC
Bilirubin Urine: NEGATIVE
Glucose, UA: NEGATIVE mg/dL
Ketones, ur: NEGATIVE mg/dL
Nitrite: NEGATIVE
Protein, ur: 30 mg/dL — AB
Specific Gravity, Urine: 1.016 (ref 1.005–1.030)
pH: 6 (ref 5.0–8.0)

## 2021-12-26 LAB — CK: Total CK: 10124 U/L — ABNORMAL HIGH (ref 38–234)

## 2021-12-26 LAB — MAGNESIUM: Magnesium: 1.7 mg/dL (ref 1.7–2.4)

## 2021-12-26 MED ORDER — LISINOPRIL 20 MG PO TABS
20.0000 mg | ORAL_TABLET | Freq: Every day | ORAL | Status: DC
  Administered 2021-12-26 – 2021-12-27 (×2): 20 mg via ORAL
  Filled 2021-12-26 (×2): qty 1

## 2021-12-26 MED ORDER — ACETAMINOPHEN 325 MG PO TABS
650.0000 mg | ORAL_TABLET | Freq: Four times a day (QID) | ORAL | Status: DC | PRN
  Administered 2021-12-26: 650 mg via ORAL
  Filled 2021-12-26: qty 2

## 2021-12-26 MED ORDER — LEVOTHYROXINE SODIUM 50 MCG PO TABS
50.0000 ug | ORAL_TABLET | Freq: Every day | ORAL | Status: DC
  Administered 2021-12-26 – 2021-12-27 (×2): 50 ug via ORAL
  Filled 2021-12-26 (×2): qty 1

## 2021-12-26 MED ORDER — POLYETHYLENE GLYCOL 3350 17 G PO PACK: 17.0000 g | PACK | Freq: Every day | ORAL | Status: DC | PRN

## 2021-12-26 MED ORDER — FENTANYL CITRATE PF 50 MCG/ML IJ SOSY: 12.5000 ug | PREFILLED_SYRINGE | INTRAMUSCULAR | Status: DC | PRN

## 2021-12-26 MED ORDER — ONDANSETRON HCL 4 MG/2ML IJ SOLN: 4.0000 mg | Freq: Four times a day (QID) | INTRAMUSCULAR | Status: DC | PRN

## 2021-12-26 MED ORDER — SODIUM CHLORIDE 0.9 % IV SOLN: INTRAVENOUS | Status: DC

## 2021-12-26 MED ORDER — FENTANYL CITRATE PF 50 MCG/ML IJ SOSY
25.0000 ug | PREFILLED_SYRINGE | INTRAMUSCULAR | Status: DC | PRN
  Administered 2021-12-26 (×2): 25 ug via INTRAVENOUS
  Filled 2021-12-26 (×2): qty 1

## 2021-12-26 MED ORDER — ALPRAZOLAM 0.25 MG PO TABS
0.2500 mg | ORAL_TABLET | Freq: Every evening | ORAL | Status: AC | PRN
  Administered 2021-12-26: 0.25 mg via ORAL
  Filled 2021-12-26: qty 1

## 2021-12-26 MED ORDER — ACETAMINOPHEN 650 MG RE SUPP: 650.0000 mg | Freq: Four times a day (QID) | RECTAL | Status: DC | PRN

## 2021-12-26 MED ORDER — POTASSIUM CHLORIDE CRYS ER 20 MEQ PO TBCR
40.0000 meq | EXTENDED_RELEASE_TABLET | Freq: Once | ORAL | Status: AC
  Administered 2021-12-26: 40 meq via ORAL
  Filled 2021-12-26: qty 2

## 2021-12-26 MED ORDER — POTASSIUM CHLORIDE CRYS ER 20 MEQ PO TBCR
20.0000 meq | EXTENDED_RELEASE_TABLET | ORAL | Status: AC
  Administered 2021-12-26: 20 meq via ORAL
  Filled 2021-12-26: qty 1

## 2021-12-26 MED ORDER — HYDRALAZINE HCL 20 MG/ML IJ SOLN: 10.0000 mg | Freq: Four times a day (QID) | INTRAMUSCULAR | Status: DC | PRN

## 2021-12-26 MED ORDER — GABAPENTIN 300 MG PO CAPS
300.0000 mg | ORAL_CAPSULE | Freq: Every day | ORAL | Status: DC
  Administered 2021-12-26: 300 mg via ORAL
  Filled 2021-12-26: qty 1

## 2021-12-26 MED ORDER — ENOXAPARIN SODIUM 40 MG/0.4ML IJ SOSY
40.0000 mg | PREFILLED_SYRINGE | Freq: Every day | INTRAMUSCULAR | Status: DC
  Administered 2021-12-26 – 2021-12-27 (×2): 40 mg via SUBCUTANEOUS
  Filled 2021-12-26 (×2): qty 0.4

## 2021-12-26 MED ORDER — PANTOPRAZOLE SODIUM 40 MG PO TBEC
40.0000 mg | DELAYED_RELEASE_TABLET | Freq: Every day | ORAL | Status: DC
  Administered 2021-12-26 – 2021-12-27 (×2): 40 mg via ORAL
  Filled 2021-12-26 (×2): qty 1

## 2021-12-26 MED ORDER — PRAVASTATIN SODIUM 10 MG PO TABS
20.0000 mg | ORAL_TABLET | Freq: Every day | ORAL | Status: DC
  Administered 2021-12-26 – 2021-12-27 (×2): 20 mg via ORAL
  Filled 2021-12-26 (×2): qty 2

## 2021-12-26 MED ORDER — ONDANSETRON HCL 4 MG PO TABS: 4.0000 mg | ORAL_TABLET | Freq: Four times a day (QID) | ORAL | Status: DC | PRN

## 2021-12-26 NOTE — Progress Notes (Signed)
  PROGRESS NOTE  Patient admitted earlier this morning. See H&P.   Patient presents after a fall at home.  She states that she was wearing slippers and socks, thinks that her foot came out of slippers causing her to fall.  Did not hit her head or lose consciousness.  She was unable to get up the whole day until her family found her later that evening.  Patient complains of pain in her left shoulder.  Ate breakfast this morning without any issues.  Continue IV fluids for rhabdomyolysis, monitor CK PT recommending home health Potassium replaced today  Left voicemail for daughter Zacarias Pontes to call us back for update   Status is: Inpatient Remains inpatient appropriate because: IVF    Dessa Phi, DO [facility] 12/26/2021, 1:36 PM  Available via Epic secure chat 7am-7pm After these hours, please refer to coverage provider listed on [email or web address]

## 2021-12-26 NOTE — Assessment & Plan Note (Signed)
.   Continuing home regimen of lipid lowering therapy.  

## 2021-12-26 NOTE — Assessment & Plan Note (Signed)
.   Resume patients home regimen of oral antihypertensives . Titrate antihypertensive regimen as necessary to achieve adequate BP control . PRN intravenous antihypertensives for excessively elevated blood pressure   

## 2021-12-26 NOTE — H&P (Signed)
History and Physical    Patient: Deanna Olson MRN: [id number] DOA: 12/25/2021  Date of Service: the patient was seen and examined on 12/26/2021  Patient coming from: Home  Chief Complaint:  Chief Complaint  Patient presents with   Fall   Shoulder Injury    HPI:   88 year old female with past medical history of chronic kidney disease stage IIIa, hypothyroidism, hypertension, hyperlipidemia, gastroesophageal reflux disease who presents to [facility] emergency department after being found down in her place of residence by her daughter.  Patient explains that approximately 7:30 in the morning on 7/14 she was walking in her home when she tripped on an object.  Patient denies any lightheadedness or loss of consciousness or focal weakness or palpitations that precipitated her fall.  Patient fell and landed on her left shoulder resulting in left upper extremity pain.  Patient describes severe pain of the right shoulder, sharp in quality, radiating distally and worse with any movement of the affected extremity.  Throughout the morning into the afternoon/evening the patient remained lying on the floor and was unable to get up.  That evening, the patient's daughter found her mother lying on the floor.  Upon further questioning patient denies any recent fever, change in oral intake, diarrhea, shortness of breath, cough, chest pain, sick contacts or recent travel.  Upon evaluation in the emergency department X-ray imaging of the left shoulder and humerus revealed no evidence of fracture.  There was substantial pain and swelling at the site status post fall a creatine kinase was obtained and found to be elevated at 6114 suggestive of early rhabdomyolysis.  Patient was initiated on a bicarbonate infusion and the hospitalist group was then called to assess the patient for admission to the hospital.  Review of Systems: Review of Systems  Musculoskeletal:  Positive for falls, joint pain and  myalgias.  Neurological:  Positive for weakness.  All other systems reviewed and are negative.    Past Medical History:  Diagnosis Date   Allergic rhinitis, cause unspecified    Anxiety state, unspecified    Aortic valve disorders    Mild AS 2013 echo    Eczema    Esophageal reflux    Herpes zoster without mention of complication    Hyperlipidemia    Hypertension    Hypothyroid    Osteoarthrosis, unspecified whether generalized or localized, unspecified site    Unspecified venous (peripheral) insufficiency     Past Surgical History:  Procedure Laterality Date   CATARACT EXTRACTION, BILATERAL  2013   KYPHOPLASTY  02/2018   VESICOVAGINAL FISTULA CLOSURE W/ TAH      Social History:  reports that she has never smoked. She has never used smokeless tobacco. She reports that she does not drink alcohol and does not use drugs.  Allergies  Allergen Reactions   Sulfonamide Derivatives     REACTION: angioedema    Family History  Problem Relation Age of Onset   Stroke Mother 44   Diabetes Mother    Diabetes Brother    Heart attack Brother 58       s/p open heart surg   CAD Brother 50   Cancer Sister        4 types    Prior to Admission medications   Medication Sig Start Date End Date Taking? Authorizing Provider  acetaminophen (TYLENOL) 500 MG tablet Take 500 mg by mouth daily as needed for moderate pain.    [provider]  albuterol (VENTOLIN HFA) 108 (90  Base) MCG/ACT inhaler Inhale 2 puffs into the lungs every 6 (six) hours as needed for wheezing or shortness of breath. 11/02/21   Biagio Borg, MD  alendronate (FOSAMAX) 70 MG tablet Take 1 tablet (70 mg total) by mouth once a week. Take with a full glass of water on an empty stomach. 10/13/21   Hoyt Koch, MD  ALPRAZolam [name]) 0.5 MG tablet TAKE ONE TABLET BY MOUTH TWICE A DAY Patient taking differently: Take 0.5 mg by mouth at bedtime. 05/11/21   Hoyt Koch, MD  b complex vitamins capsule  Take 1 capsule by mouth daily.    [provider]  benzonatate (TESSALON) 200 MG capsule Take 1 capsule (200 mg total) by mouth 2 (two) times daily as needed for cough. 10/22/21   Henson, Vickie L, NP-C  betamethasone dipropionate 0.05 % lotion Apply topically daily as needed. Uses on scalp and ears 10/13/21   Hoyt Koch, MD  Cholecalciferol (VITAMIN D3 PO) Take 2,000 Units by mouth daily.    [provider]  diclofenac sodium (VOLTAREN) 1 % GEL Apply 2 g topically as needed.    [provider]  diphenhydramine-acetaminophen (TYLENOL PM) 25-500 MG TABS tablet Take 1 tablet by mouth at bedtime as needed.    [provider]  doxycycline (VIBRA-TABS) 100 MG tablet Take 1 tablet (100 mg total) by mouth 2 (two) times daily. 11/02/21   Biagio Borg, MD  gabapentin (NEURONTIN) 300 MG capsule TAKE ONE CAPSULE BY MOUTH EVERY NIGHT AT BEDTIME 12/22/21   Hoyt Koch, MD  Glucos-Chond-MSM-Bor-D3-Hyalur (MOVE FREE JOINT HEALTH ADV + D) TABS Take 1 tablet by mouth daily.    [provider]  guaiFENesin-dextromethorphan (ROBITUSSIN DM) 100-10 MG/5ML syrup Take 5 mLs by mouth every 4 (four) hours as needed for cough. 10/30/21   Biagio Borg, MD  ketoconazole (NIZORAL) 2 % shampoo USE TO SHAMPOO TWICE WEEKLY 10/15/21   Hoyt Koch, MD  levothyroxine (SYNTHROID) 50 MCG tablet TAKE 1 TABLET BY MOUTH DAILY BEFORE BREAKFAST--OVERDUE OFFICE VISIT 12/22/21   Hoyt Koch, MD  Lidocaine 4 % PTCH Apply 1 patch topically daily.    [provider]  lisinopril (ZESTRIL) 20 MG tablet TAKE ONE TABLET BY MOUTH DAILY 09/03/21   Hoyt Koch, MD  Multiple Minerals-Vitamins (CALCIUM-MAGNESIUM-ZINC-D3 PO) Take 3 tablets by mouth. Calcium '1000mg'$  , Magnesium '400mg'$ , Zing '15mg'$  , D3 400 units (in 1 serving)  1 serving = 3 tables    [provider]  Multiple Vitamins-Minerals (PRESERVISION AREDS 2+MULTI VIT PO) Take 1 capsule by mouth  daily.    [provider]  Omega-3 Fatty Acids (OMEGA-3 2100 PO) Take 1 capsule by mouth.    [provider]  omeprazole (PRILOSEC) 20 MG capsule TAKE ONE CAPSULE BY MOUTH DAILY ON AN EMPTY STOMACH 09/10/21   Hoyt Koch, MD  Polyethyl Glycol-Propyl Glycol 0.4-0.3 % SOLN Apply 1 drop to eye daily as needed.    [provider]  POTASSIUM GLUCONATE PO Take 1 tablet by mouth daily.    [provider]  pravastatin (PRAVACHOL) 20 MG tablet TAKE ONE TABLET BY MOUTH DAILY 09/03/21   Hoyt Koch, MD  predniSONE (DELTASONE) 10 MG tablet 2 tabs by mouth per day for 5 days 11/02/21   Biagio Borg, MD  triamcinolone cream (KENALOG) 0.1 % Apply topically 2 (two) times daily. 10/13/21   Hoyt Koch, MD  vitamin C (ASCORBIC ACID) 500 MG tablet Take 500  mg by mouth daily.    [provider]  Xylitol (XYLIMELTS MT) Use as directed 1 tablet in the mouth or throat at bedtime.    [provider]    Physical Exam:  Vitals:   12/25/21 2300 12/26/21 0045 12/26/21 0100 12/26/21 0200  BP: 131/79  (!) 148/59 137/62  Pulse: 65 68  65  Resp: 18 (!) 28 (!) 27 (!) 25  Temp:      TempSrc:      SpO2: 95% 96%  97%    Constitutional: Awake alert and oriented x3, no associated distress.   Skin: no rashes, no lesions, poor skin turgor noted. Eyes: Pupils are equally reactive to light.  No evidence of scleral icterus or conjunctival pallor.  ENMT: slightly dry mucous membranes noted.  Posterior pharynx clear of any exudate or lesions.   Neck: normal, supple, no masses, no thyromegaly.  No evidence of jugular venous distension.   Respiratory: clear to auscultation bilaterally, no wheezing, no crackles. Normal respiratory effort. No accessory muscle use.  Cardiovascular: Regular rate and rhythm, no murmurs / rubs / gallops.  Minimal edema of the left upper extremity.  2+ pedal pulses. No carotid bruits.  Chest:   Nontender without crepitus or  deformity.   Back:   Nontender without crepitus or deformity. Abdomen: Abdomen is soft and nontender.  No evidence of intra-abdominal masses.  Positive bowel sounds noted in all quadrants.   Musculoskeletal: Notable tenderness diffusely throughout the left shoulder down to the point of the left elbow.  Noted range of motion of the left shoulder due to pain.  No contractures. Normal muscle tone.  Neurologic: CN 2-12 grossly intact. Sensation intact.  Patient moving all 4 extremities spontaneously.  Patient is following all commands.  Patient is responsive to verbal stimuli.   Psychiatric: Patient exhibits normal mood with appropriate affect.  Patient seems to possess insight as to their current situation.    Data Reviewed:  I have personally reviewed and interpreted labs, imaging.  Significant findings are:  Chemistry revealing sodium 136, potassium 4.4, chloride 102, bicarbonate 20, glucose 114, BUN 18, creatinine of 1.07. CBC revealing white blood cell count of 7.5, hemoglobin 13.2, hematocrit 40.5, platelet count of 148 Creatine kinase found to be 6114 X-rays of the left humerus and shoulder negative for fracture or dislocation  EKG: Personally reviewed.  Rhythm is normal sinus rhythm with heart rate of 74 bpm.  Evidence of intraventricular conduction delay.  No dynamic ST segment changes appreciated.   Assessment and Plan: * Rhabdomyolysis Patient presenting status post fall and being found down after lying on the floor for the majority of the day Evidence of localized pain and swelling of the left shoulder Creatinine kinase over 6000 suggestive of early rhabdomyolysis likely secondary to traumatic injury of the left shoulder ER providers initiated bicarbonate infusion.  We will go ahead and complete a 1 L infusion of this followed by transitioning patient to normal saline with target urine output of over 200 cc/h Monitoring creatine kinase levels  As needed opiate-based analgesics for  substantial pain of the left shoulder Performing serial neurovascular checks of the left shoulder and arm, if there is any evidence of neurovascular compromise we will consult surgery Monitoring renal function and electrolytes with serial chemistries to ensure there is no evidence of developing renal failure  Fall at home, initial encounter Patient reports no loss of consciousness lightheadedness or palpitations EKG unremarkable Likely mechanical fall Fall precautions while here Obtaining PT evaluation  Chronic kidney disease, stage 3a (HCC) Strict intake and output monitoring Creatinine near baseline Minimizing nephrotoxic agents as much as possible Serial chemistries to monitor renal function and electrolytes   Essential hypertension Resume patients home regimen of oral antihypertensives Titrate antihypertensive regimen as necessary to achieve adequate BP control PRN intravenous antihypertensives for excessively elevated blood pressure    Mixed hyperlipidemia Continuing home regimen of lipid lowering therapy.   GERD without esophagitis Continuing home regimen of daily PPI therapy.        Code Status:  Full code  code status decision has been confirmed with: patient Family Communication: deferred   Consults: None  Severity of Illness:  The appropriate patient status for this patient is OBSERVATION. Observation status is judged to be reasonable and necessary in order to provide the required intensity of service to ensure the patient's safety. The patient's presenting symptoms, physical exam findings, and initial radiographic and laboratory data in the context of their medical condition is felt to place them at decreased risk for further clinical deterioration. Furthermore, it is anticipated that the patient will be medically stable for discharge from the hospital within 2 midnights of admission.   Author:  Vernelle Emerald MD  12/26/2021 3:35 AM

## 2021-12-26 NOTE — Progress Notes (Signed)
New Admission Note:   Arrival Method: Via stretcher from ED Mental Orientation:  A & O x4 Telemetry: Box 14 - NSR Assessment: Completed Skin:  Intact IV:  Rt Hand/Rt AC Pain: Denies Tubes:  None Safety Measures: Safety Fall Prevention Plan has been given, discussed and signed Admission: Completed 5 MW Orientation: Patient has been orientated to the room, unit and staff.  Family:  None at bedside  Patient is from home alone with excellent support from her 3 daughters.  Her glasses and bilateral hearing aides were left at home.  She has her husband's wedding band, her diamond ring, and watch on.  She has a bruise to left 5th toe.  Her left ankle is swollen but she states this is normal for her and was not caused by her fall.  Orders have been reviewed and implemented. Will continue to monitor the patient. Call light has been placed within reach and bed alarm has been activated.   Earleen Reaper RN- BC, [facility] Phone number: [phone]

## 2021-12-26 NOTE — Progress Notes (Signed)
Daughter Zacarias Pontes had some concerns about pt d/c plan. She wanted to be sure that staff know pt lives alone and would it be safe for her to return home with just pt vs going to a short term rehab.

## 2021-12-26 NOTE — Assessment & Plan Note (Signed)
   Patient presenting status post fall and being found down after lying on the floor for the majority of the day  Evidence of localized pain and swelling of the left shoulder  Creatinine kinase over 6000 suggestive of early rhabdomyolysis likely secondary to traumatic injury of the left shoulder  ER providers initiated bicarbonate infusion.  We will go ahead and complete a 1 L infusion of this followed by transitioning patient to normal saline with target urine output of over 200 cc/h  Monitoring creatine kinase levels   As needed opiate-based analgesics for substantial pain of the left shoulder  Performing serial neurovascular checks of the left shoulder and arm, if there is any evidence of neurovascular compromise we will consult surgery  Monitoring renal function and electrolytes with serial chemistries to ensure there is no evidence of developing renal failure

## 2021-12-26 NOTE — Assessment & Plan Note (Signed)
Continuing home regimen of daily PPI therapy.  

## 2021-12-26 NOTE — Assessment & Plan Note (Signed)
Strict intake and output monitoring Creatinine near baseline Minimizing nephrotoxic agents as much as possible Serial chemistries to monitor renal function and electrolytes  

## 2021-12-26 NOTE — Evaluation (Signed)
Physical Therapy Evaluation Patient Details Name: Deanna Olson MRN: [id number] DOB: 03-22-1926 Today's Date: 12/26/2021  History of Present Illness  88 year old female who presents to [facility] ED 7/14 after being found down in her place of residence by her daughter after numerous hours. C/o L shoulder pain. C-ray clear of L shoulder or humerus fx. Admitted for treatment of rhabdomyolysis PMH: chronic kidney disease stage IIIa, hypothyroidism, hypertension, hyperlipidemia, gastroesophageal reflux disease  Clinical Impression  Pt sitting up on BSC on entry. PTA pt living alone in single story home with one step to enter. Pt ambulates with RW in home and Rollator in the community. Pt independent in ADL and iADLs, pt does report daughter has to do vacuuming. Pt is currently limited in safe mobility by L shoulder pain, in presence of decreased strength and balance. Pt requires min A for transfers and short distance ambulation without AD due to inability to use RW due to L shoulder pain and sling. PT recommending 24 hour support from family and HHPT at discharge. PT will continue to follow acutely.       Recommendations for follow up therapy are one component of a multi-disciplinary discharge planning process, led by the attending physician.  Recommendations may be updated based on patient status, additional functional criteria and insurance authorization.  Follow Up Recommendations Home health PT      Assistance Recommended at Discharge Frequent or constant Supervision/Assistance  Patient can return home with the following  A little help with walking and/or transfers;A little help with bathing/dressing/bathroom;Assistance with cooking/housework;Assist for transportation;Help with stairs or ramp for entrance    Equipment Recommendations BSC/3in1     Functional Status Assessment Patient has had a recent decline in their functional status and demonstrates the ability to make significant improvements in  function in a reasonable and predictable amount of time.     Precautions / Restrictions Precautions Precautions: Fall Precaution Comments: hospitalization result of fall Restrictions Weight Bearing Restrictions: Yes LUE Weight Bearing: Weight bearing as tolerated Other Position/Activity Restrictions: sling for comfort      Mobility  Bed Mobility               General bed mobility comments: sitting on BSC on entry    Transfers Overall transfer level: Needs assistance   Transfers: Sit to/from Stand Sit to Stand: Min assist           General transfer comment: good power up, min A for steadying, L UE in sling and unable to use for steadying on RW    Ambulation/Gait Ambulation/Gait assistance: Min assist Gait Distance (Feet): 16 Feet Assistive device: 1 person hand held assist Gait Pattern/deviations: Step-through pattern, Decreased step length - right, Decreased step length - left, Shuffle, Wide base of support Gait velocity: slowed Gait velocity interpretation: <1.31 ft/sec, indicative of household ambulator   General Gait Details: minA for steadying, usually uses RW but unable to use due to L UE pain and shoulder sling, slowed, mildly unsteady gait with support around waist with gait belt      Balance Overall balance assessment: Needs assistance Sitting-balance support: Feet supported, No upper extremity supported Sitting balance-Leahy Scale: Good Sitting balance - Comments: able to perform self pericare in sitting   Standing balance support: Single extremity supported Standing balance-Leahy Scale: Poor Standing balance comment: able to static stand without assist, assist needed for dynamic activity  Pertinent Vitals/Pain Pain Assessment Pain Assessment: Faces Faces Pain Scale: Hurts little more Pain Location: L UE with weightbearing Pain Descriptors / Indicators: Grimacing, Guarding Pain Intervention(s): Limited  activity within patient's tolerance, Monitored during session, Repositioned    Home Living Family/patient expects to be discharged to:: Private residence Living Arrangements: Alone Available Help at Discharge: Family;Available PRN/intermittently Type of Home: House Home Access: Stairs to enter Entrance Stairs-Rails: Right Entrance Stairs-Number of Steps: 1 Alternate Level Stairs-Number of Steps: 1 Home Layout: Multi-level;Other (Comment) (den is one step down) Home Equipment: Agricultural consultant (2 wheels);Rollator (4 wheels)      Prior Function Prior Level of Function : Independent/Modified Independent             Mobility Comments: ambulates limited community distances with Rollator, daughter drives her ADLs Comments: independent with ADL and iADLs, except for vacuuming     Hand Dominance   Dominant Hand: Right    Extremity/Trunk Assessment   Upper Extremity Assessment Upper Extremity Assessment: Defer to OT evaluation    Lower Extremity Assessment Lower Extremity Assessment: Generalized weakness    Cervical / Trunk Assessment Cervical / Trunk Assessment: Kyphotic  Communication   Communication: No difficulties;HOH (hearing aides left at home)  Cognition Arousal/Alertness: Awake/alert Behavior During Therapy: WFL for tasks assessed/performed Overall Cognitive Status: Within Functional Limits for tasks assessed                                          General Comments General comments (skin integrity, edema, etc.): VSS on RA        Assessment/Plan    PT Assessment Patient needs continued PT services  PT Problem List Decreased strength;Decreased activity tolerance;Decreased balance;Decreased mobility;Decreased range of motion;Pain       PT Treatment Interventions DME instruction;Gait training;Stair training;Functional mobility training;Therapeutic activities;Therapeutic exercise;Balance training;Cognitive remediation;Patient/family education     PT Goals (Current goals can be found in the Care Plan section)  Acute Rehab PT Goals Patient Stated Goal: get back home PT Goal Formulation: With patient Time For Goal Achievement: 01/09/22 Potential to Achieve Goals: Good    Frequency Min 3X/week        AM-PAC PT "6 Clicks" Mobility  Outcome Measure Help needed turning from your back to your side while in a flat bed without using bedrails?: A Little Help needed moving from lying on your back to sitting on the side of a flat bed without using bedrails?: A Little Help needed moving to and from a bed to a chair (including a wheelchair)?: A Little Help needed standing up from a chair using your arms (e.g., wheelchair or bedside chair)?: A Little Help needed to walk in hospital room?: A Little Help needed climbing 3-5 steps with a railing? : A Little 6 Click Score: 18    End of Session Equipment Utilized During Treatment: Gait belt Activity Tolerance: Patient tolerated treatment well Patient left: in chair;with call bell/phone within reach;with chair alarm set Nurse Communication: Mobility status PT Visit Diagnosis: Unsteadiness on feet (R26.81);History of falling (Z91.81);Muscle weakness (generalized) (M62.81);Difficulty in walking, not elsewhere classified (R26.2);Pain Pain - Right/Left: Left Pain - part of body: Shoulder    Time: 6440-3474 PT Time Calculation (min) (ACUTE ONLY): 24 min   Charges:   PT Evaluation $PT Eval Moderate Complexity: 1 Mod PT Treatments $Therapeutic Activity: 8-22 mins        Chrisotpher Rivero B. Beverely Risen PT, DPT  Acute Rehabilitation Services Please use secure chat or  Call Office [phone]   Elon Alas [facility] 12/26/2021, 9:59 AM

## 2021-12-26 NOTE — Assessment & Plan Note (Addendum)
   Patient reports no loss of consciousness lightheadedness or palpitations  EKG unremarkable  Likely mechanical fall  Fall precautions while here  Obtaining PT evaluation

## 2021-12-26 NOTE — Progress Notes (Signed)
Orthopedic Tech Progress Note Patient Details:  Deanna Olson [birth year] [id number]  Patient ID: [name], female   DOB: 1925/11/03, 88 y.o.   MRN: [id number] I assisted with sling immobilizer application. Karolee Stamps 12/26/2021, 8:48 PM

## 2021-12-27 DIAGNOSIS — T796XXA Traumatic ischemia of muscle, initial encounter: Secondary | ICD-10-CM | POA: Diagnosis not present

## 2021-12-27 LAB — HEPATIC FUNCTION PANEL
ALT: 74 U/L — ABNORMAL HIGH (ref 0–44)
AST: 247 U/L — ABNORMAL HIGH (ref 15–41)
Albumin: 2.7 g/dL — ABNORMAL LOW (ref 3.5–5.0)
Alkaline Phosphatase: 29 U/L — ABNORMAL LOW (ref 38–126)
Bilirubin, Direct: 0.1 mg/dL (ref 0.0–0.2)
Indirect Bilirubin: 0.5 mg/dL (ref 0.3–0.9)
Total Bilirubin: 0.6 mg/dL (ref 0.3–1.2)
Total Protein: 5.4 g/dL — ABNORMAL LOW (ref 6.5–8.1)

## 2021-12-27 LAB — MAGNESIUM: Magnesium: 1.6 mg/dL — ABNORMAL LOW (ref 1.7–2.4)

## 2021-12-27 LAB — BASIC METABOLIC PANEL
Anion gap: 12 (ref 5–15)
BUN: 12 mg/dL (ref 8–23)
CO2: 19 mmol/L — ABNORMAL LOW (ref 22–32)
Calcium: 7.9 mg/dL — ABNORMAL LOW (ref 8.9–10.3)
Chloride: 105 mmol/L (ref 98–111)
Creatinine, Ser: 0.91 mg/dL (ref 0.44–1.00)
GFR, Estimated: 59 mL/min — ABNORMAL LOW (ref >60)
Glucose, Bld: 85 mg/dL (ref 70–99)
Potassium: 4.6 mmol/L (ref 3.5–5.1)
Sodium: 136 mmol/L (ref 135–145)

## 2021-12-27 LAB — CK: Total CK: 5912 U/L — ABNORMAL HIGH (ref 38–234)

## 2021-12-27 MED ORDER — MAGNESIUM SULFATE 2 GM/50ML IV SOLN
2.0000 g | Freq: Once | INTRAVENOUS | Status: AC
  Administered 2021-12-27: 2 g via INTRAVENOUS
  Filled 2021-12-27: qty 50

## 2021-12-27 NOTE — Plan of Care (Signed)
  Problem: Health Behavior/Discharge Planning: Goal: Ability to manage health-related needs will improve Outcome: Progressing   

## 2021-12-27 NOTE — Evaluation (Signed)
Occupational Therapy Evaluation Patient Details Name: Deanna Olson MRN: [id number] DOB: [birth year] Today's Date: 12/27/2021   History of Present Illness 88 year old female who presents to [facility] ED 7/14 after being found down in her place of residence by her daughter after numerous hours. C/o L shoulder pain. C-ray clear of L shoulder or humerus fx. Admitted for treatment of rhabdomyolysis PMH: chronic kidney disease stage IIIa, hypothyroidism, hypertension, hyperlipidemia, gastroesophageal reflux disease   Clinical Impression   Pt independent at baseline with ADLs, uses rollator for mobility, lives alone but has 3 daughters that live nearby that assist with driving. Pt with mild LUE pain with shoulder flex/abd, however ROM WFL.  Pt needing min guard-mod A for ADLs, and min  guard for transfers with RW. Pt presenting with impairments listed below, will follow acutely. Recommend HHOT at d/c.     Recommendations for follow up therapy are one component of a multi-disciplinary discharge planning process, led by the attending physician.  Recommendations may be updated based on patient status, additional functional criteria and insurance authorization.   Follow Up Recommendations  Home health OT    Assistance Recommended at Discharge Intermittent Supervision/Assistance  Patient can return home with the following Assistance with cooking/housework;Assistance with feeding;Help with stairs or ramp for entrance;Assist for transportation;A little help with walking and/or transfers;A little help with bathing/dressing/bathroom    Functional Status Assessment  Patient has had a recent decline in their functional status and demonstrates the ability to make significant improvements in function in a reasonable and predictable amount of time.  Equipment Recommendations  BSC/3in1 (as shower seat)    Recommendations for Other Services PT consult     Precautions / Restrictions Precautions Precautions:  Fall Precaution Comments: hospitalization result of fall Restrictions Weight Bearing Restrictions: No LUE Weight Bearing: Weight bearing as tolerated Other Position/Activity Restrictions: sling for comfort      Mobility Bed Mobility               General bed mobility comments: in chair upon arrival    Transfers Overall transfer level: Needs assistance Equipment used: Rolling walker (2 wheels) Transfers: Sit to/from Stand Sit to Stand: Min guard                  Balance Overall balance assessment: Needs assistance Sitting-balance support: Feet supported, No upper extremity supported Sitting balance-Leahy Scale: Good     Standing balance support: Single extremity supported Standing balance-Leahy Scale: Poor Standing balance comment: able to static stand without assist, assist needed for dynamic activity                           ADL either performed or assessed with clinical judgement   ADL Overall ADL's : Needs assistance/impaired Eating/Feeding: Modified independent   Grooming: Min guard;Sitting;Standing   Upper Body Bathing: Minimal assistance;Sitting   Lower Body Bathing: Moderate assistance;Sitting/lateral leans   Upper Body Dressing : Minimal assistance;Sitting   Lower Body Dressing: Moderate assistance Lower Body Dressing Details (indicate cue type and reason): to don shoes Toilet Transfer: Min guard;Ambulation;Regular Toilet;Rolling walker (2 wheels) Toilet Transfer Details (indicate cue type and reason): simulated in room Toileting- Clothing Manipulation and Hygiene: Supervision/safety;Sitting/lateral lean Toileting - Clothing Manipulation Details (indicate cue type and reason): for pericare     Functional mobility during ADLs: Min guard;Rolling walker (2 wheels);Cueing for sequencing;Cueing for safety       Vision   Vision Assessment?: No apparent visual deficits     Perception  Praxis      Pertinent Vitals/Pain Pain  Assessment Pain Assessment: Faces Pain Score: 2  Faces Pain Scale: Hurts a little bit Pain Location: LUE with ROM Pain Descriptors / Indicators: Grimacing, Guarding Pain Intervention(s): Limited activity within patient's tolerance, Monitored during session     Hand Dominance Right   Extremity/Trunk Assessment Upper Extremity Assessment Upper Extremity Assessment: Generalized weakness (L >R consistent with fall, pain with shoulder abd/flex)   Lower Extremity Assessment Lower Extremity Assessment: Generalized weakness   Cervical / Trunk Assessment Cervical / Trunk Assessment: Kyphotic   Communication Communication Communication: HOH   Cognition Arousal/Alertness: Awake/alert Behavior During Therapy: WFL for tasks assessed/performed Overall Cognitive Status: Within Functional Limits for tasks assessed                                 General Comments: A & O x4     General Comments  VSS on RA    Exercises     Shoulder Instructions      Home Living Family/patient expects to be discharged to:: Private residence Living Arrangements: Alone Available Help at Discharge: Family;Available PRN/intermittently Type of Home: House Home Access: Stairs to enter [employer] of Steps: 1 Entrance Stairs-Rails: Right Home Layout: Multi-level;Other (Comment) Alternate Level Stairs-Number of Steps: 1 Alternate Level Stairs-Rails: Right Bathroom Shower/Tub: Walk-in shower         Home Equipment: Conservation officer, nature (2 wheels);Rollator (4 wheels)          Prior Functioning/Environment Prior Level of Function : Independent/Modified Independent             Mobility Comments: ambulates limited community distances with Rollator, daughter drives her ADLs Comments: independent with ADL and iADLs, except for vacuuming        OT Problem List: Decreased strength;Decreased range of motion;Decreased activity tolerance;Impaired balance (sitting and/or  standing)      OT Treatment/Interventions: Self-care/ADL training;Therapeutic exercise;Therapeutic activities;Balance training;Patient/family education;Energy conservation;DME and/or AE instruction    OT Goals(Current goals can be found in the care plan section) Acute Rehab OT Goals Patient Stated Goal: to get better OT Goal Formulation: With patient Time For Goal Achievement: 01/10/22 Potential to Achieve Goals: Good ADL Goals Pt Will Perform Upper Body Dressing: with supervision;sitting;standing Pt Will Perform Lower Body Dressing: with supervision;sitting/lateral leans;sit to/from stand Pt Will Transfer to Toilet: ambulating;regular height toilet;with supervision Pt Will Perform Tub/Shower Transfer: Shower transfer;3 in 1;rolling walker;with supervision Additional ADL Goal #1: pt will complete bed mobility mod I in prep for ADLs  OT Frequency: Min 2X/week    Co-evaluation              AM-PAC OT "6 Clicks" Daily Activity     Outcome Measure Help from another person eating meals?: None Help from another person taking care of personal grooming?: A Little Help from another person toileting, which includes using toliet, bedpan, or urinal?: A Little Help from another person bathing (including washing, rinsing, drying)?: A Lot Help from another person to put on and taking off regular upper body clothing?: A Little Help from another person to put on and taking off regular lower body clothing?: A Lot 6 Click Score: 17   End of Session Equipment Utilized During Treatment: Gait belt;Rolling walker (2 wheels) Nurse Communication: Mobility status  Activity Tolerance: Patient tolerated treatment well Patient left: in chair;with call bell/phone within reach;with chair alarm set;with nursing/sitter in room  OT Visit Diagnosis: Unsteadiness on feet (R26.81);Other  abnormalities of gait and mobility (R26.89);Muscle weakness (generalized) (M62.81);History of falling (Z91.81)                 Time: 7342-8768 OT Time Calculation (min): 17 min Charges:  OT General Charges $OT Visit: 1 Visit OT Evaluation $OT Eval Low Complexity: 1 Low  Lynnda Child, OTD, OTR/L Acute Rehab [phone]) [phone] - Speed 12/27/2021, 10:55 AM

## 2021-12-27 NOTE — Progress Notes (Signed)
Mobility Specialist Progress Note:   12/27/21 0920  Mobility  Activity Ambulated with assistance in hallway  Level of Assistance Minimal assist, patient does 75% or more  Assistive Device Front wheel walker  Distance Ambulated (ft) 200 ft  Activity Response Tolerated well  $Mobility charge 1 Mobility   Pt eager for mobility session. Required minA to stand from recliner, CGA with gait. Pt back in chair with all needs met.   Nelta Numbers Acute Rehab Secure Chat or Office Phone: [phone]

## 2021-12-27 NOTE — Progress Notes (Signed)
DISCHARGE NOTE HOME Deanna Olson to be discharged Home per MD order. Discussed prescriptions and follow up appointments with the patient. Prescriptions given to patient; medication list explained in detail. Patient verbalized understanding.  Skin clean, dry and intact without evidence of skin break down, no evidence of skin tears noted. IV catheter discontinued intact. Site without signs and symptoms of complications. Dressing and pressure applied. Pt denies pain at the site currently. No complaints noted.  Patient free of lines, drains, and wounds.   An After Visit Summary (AVS) was printed and given to the patient. Patient escorted via wheelchair, and discharged home via private auto.  Arlyss Repress, RN

## 2021-12-27 NOTE — Plan of Care (Signed)
  Problem: Health Behavior/Discharge Planning: Goal: Ability to manage health-related needs will improve 12/27/2021 1026 by Arlyss Repress, RN Outcome: Progressing 12/27/2021 1024 by Arlyss Repress, RN Outcome: Progressing

## 2021-12-27 NOTE — TOC Transition Note (Addendum)
Transition of Care [facility]) - CM/SW Discharge Note   Patient Details  Name: Deanna Olson MRN: [id number] Date of Birth: April 21, 1926  [facility] CM/SW Contact:  Bartholomew Crews, RN Phone Number: [phone] 12/27/2021, 12:23 PM   Clinical Narrative:     Notified by MD that patient to transition home today and needed home health services. Spoke with patient's daughter, Deanna Olson, on her cell phone to discuss post acute transition. Patient living home alone, but has 3 daughters who live within 5 miles and can assist with supervision. Patient has friends who are available to assist as needed. Discussed recommendations for 3N1 - patient does not have a bathroom in her bedroom. Referral to [facility] for delivery to the room. Discussed HHPT/OT recommendations. Choice offered - no preference. Referral accepted by [facility]. Family to provide transportation home in private vehicle. No further TOC needs identified.   Final next level of care: [place] Barriers to Discharge: No Barriers Identified   Patient Goals and CMS Choice Patient states their goals for this hospitalization and ongoing recovery are:: return home CMS Medicare.gov Compare Post Acute Care list provided to:: Patient Represenative (must comment) (daughter, Deanna Olson) Choice offered to / list presented to : Adult Children Deanna Olson)  Discharge Placement                       Discharge Plan and Services                DME Arranged: 3-N-1 DME Agency: [facility] Date DME Agency Contacted: 12/27/21 Time DME Agency Contacted: 1219 Representative spoke with at DME Agency: Mardene Celeste HH Arranged: PT, OT [facility] Agency: [place] Date [place]: 12/27/21 Time [place]: 1222 Representative spoke with at [place]: Amy  Social Determinants of Health ([place]) Interventions     Readmission Risk Interventions     No data to display

## 2021-12-27 NOTE — Discharge Summary (Signed)
Physician Discharge Summary  Deanna Olson NOM:[id number] DOB: [birth year] DOA: 12/25/2021  PCP: Hoyt Koch, MD  Admit date: 12/25/2021 Discharge date: 12/27/2021  Admitted From: Home Disposition:  Home with home health   Recommendations for Outpatient Follow-up:  Follow up with PCP in 1 week  Discharge Condition: Stable CODE STATUS: Full  Diet recommendation: Regular   Brief/Interim Summary: From H&P by Dr. Cyd Silence: "88 year old female with past medical history of chronic kidney disease stage IIIa, hypothyroidism, hypertension, hyperlipidemia, gastroesophageal reflux disease who presents to [facility] emergency department after being found down in her place of residence by her daughter.   Patient explains that approximately 7:30 in the morning on 7/14 she was walking in her home when she tripped on an object.  Patient denies any lightheadedness or loss of consciousness or focal weakness or palpitations that precipitated her fall.  Patient fell and landed on her left shoulder resulting in left upper extremity pain.  Patient describes severe pain of the right shoulder, sharp in quality, radiating distally and worse with any movement of the affected extremity.  Throughout the morning into the afternoon/evening the patient remained lying on the floor and was unable to get up.  That evening, the patient's daughter found her mother lying on the floor.  Upon further questioning patient denies any recent fever, change in oral intake, diarrhea, shortness of breath, cough, chest pain, sick contacts or recent travel.  Upon evaluation in the emergency department X-ray imaging of the left shoulder and humerus revealed no evidence of fracture.  There was substantial pain and swelling at the site status post fall a creatine kinase was obtained and found to be elevated at 6114 suggestive of early rhabdomyolysis.  Patient was initiated on a bicarbonate infusion and the hospitalist group was  then called to assess the patient for admission to the hospital."  She was treated with IVF for rhabdomyolysis with improvement in CK. She worked with PT and was recommended for home health.  Pain in her left shoulder continues to improve.  Mg replaced. She was discharged home in stable condition with family support.  Discharge Diagnoses:   Principal Problem:   Rhabdomyolysis Active Problems:   Fall at home, initial encounter   Chronic kidney disease, stage 3a ([place])   Essential hypertension   Mixed hyperlipidemia   GERD without esophagitis   Hypomagnesemia     Discharge Instructions  Discharge Instructions     Call MD for:  difficulty breathing, headache or visual disturbances   Complete by: As directed    Call MD for:  extreme fatigue   Complete by: As directed    Call MD for:  hives   Complete by: As directed    Call MD for:  persistant dizziness or light-headedness   Complete by: As directed    Call MD for:  persistant nausea and vomiting   Complete by: As directed    Call MD for:  severe uncontrolled pain   Complete by: As directed    Call MD for:  temperature >100.4   Complete by: As directed    Diet general   Complete by: As directed    Discharge instructions   Complete by: As directed    You were cared for by a hospitalist during your hospital stay. If you have any questions about your discharge medications or the care you received while you were in the hospital after you are discharged, you can call the unit and ask to speak with the hospitalist  on call if the hospitalist that took care of you is not available. Once you are discharged, your primary care physician will handle any further medical issues. Please note that NO REFILLS for any discharge medications will be authorized once you are discharged, as it is imperative that you return to your primary care physician (or establish a relationship with a primary care physician if you do not have one) for your aftercare  needs so that they can reassess your need for medications and monitor your lab values.   Increase activity slowly   Complete by: As directed       Allergies as of 12/27/2021       Reactions   Sulfonamide Derivatives Swelling   Angioedema         Medication List     STOP taking these medications    albuterol 108 (90 Base) MCG/ACT inhaler Commonly known as: VENTOLIN HFA   benzonatate 200 MG capsule Commonly known as: TESSALON   doxycycline 100 MG tablet Commonly known as: VIBRA-TABS   predniSONE 10 MG tablet Commonly known as: DELTASONE   triamcinolone cream 0.1 % Commonly known as: KENALOG       TAKE these medications    acetaminophen 500 MG tablet Commonly known as: TYLENOL Take 500 mg by mouth daily as needed for moderate pain.   alendronate 70 MG tablet Commonly known as: FOSAMAX Take 1 tablet (70 mg total) by mouth once a week. Take with a full glass of water on an empty stomach. What changed:  when to take this additional instructions   ALPRAZolam 0.5 MG tablet Commonly known as: XANAX TAKE ONE TABLET BY MOUTH TWICE A DAY What changed: when to take this   b complex vitamins capsule Take 1 capsule by mouth daily.   betamethasone dipropionate 0.05 % lotion Apply topically daily as needed. Uses on scalp and ears   Bioflex Tabs Take 1 tablet by mouth daily.   diphenhydramine-acetaminophen 25-500 MG Tabs tablet Commonly known as: TYLENOL PM Take 1 tablet by mouth at bedtime as needed (pain, sleep).   gabapentin 300 MG capsule Commonly known as: NEURONTIN TAKE ONE CAPSULE BY MOUTH EVERY NIGHT AT BEDTIME   guaiFENesin-dextromethorphan 100-10 MG/5ML syrup Commonly known as: ROBITUSSIN DM Take 5 mLs by mouth every 4 (four) hours as needed for cough.   ketoconazole 2 % shampoo Commonly known as: NIZORAL USE TO SHAMPOO TWICE WEEKLY What changed:  how much to take how to take this when to take this additional instructions   levothyroxine 50  MCG tablet Commonly known as: SYNTHROID TAKE 1 TABLET BY MOUTH DAILY BEFORE BREAKFAST--OVERDUE OFFICE VISIT What changed: See the new instructions.   lidocaine 4 % Apply 1 patch topically daily.   lisinopril 20 MG tablet Commonly known as: ZESTRIL TAKE ONE TABLET BY MOUTH DAILY   omeprazole 20 MG capsule Commonly known as: PRILOSEC TAKE ONE CAPSULE BY MOUTH DAILY ON AN EMPTY STOMACH What changed: See the new instructions.   Polyethyl Glycol-Propyl Glycol 0.4-0.3 % Soln Apply 1 drop to eye 2 (two) times daily as needed (dry eyes).   POTASSIUM PO Take 1 tablet by mouth daily.   pravastatin 20 MG tablet Commonly known as: PRAVACHOL TAKE ONE TABLET BY MOUTH DAILY   PreserVision AREDS 2 Caps Take 1 capsule by mouth daily.   PREVAGEN PO Take 1 capsule by mouth daily.   VITAMIN C PO Take 1 tablet by mouth daily.   VITAMIN D-3 PO Take 1 capsule by mouth daily.  VITAMIN E PO Take 1 capsule by mouth daily.        Follow-up Information     Hoyt Koch, MD. Schedule an appointment as soon as possible for a visit in 1 week(s).   Specialty: Internal Medicine Contact information: [place] [address] [phone]                Allergies  Allergen Reactions   Sulfonamide Derivatives Swelling    Angioedema     Consultations: None   Procedures/Studies: DG Humerus Left  Result Date: 12/25/2021 CLINICAL DATA:  Trauma, fall EXAM: LEFT HUMERUS - 2+ VIEW COMPARISON:  None Available. FINDINGS: There is no evidence of fracture or other focal bone lesions. Soft tissues are unremarkable. IMPRESSION: No fracture is seen in the left humerus. Electronically Signed   By: Elmer Picker M.D.   On: 12/25/2021 20:23   DG Shoulder Left  Result Date: 12/25/2021 CLINICAL DATA:  Trauma, fall EXAM: LEFT SHOULDER - 2+ VIEW COMPARISON:  None Available. FINDINGS: No fracture or dislocation is seen. Evaluation is limited without axillary view.  Small bony spurs are seen in left AC joint. There are no abnormal soft tissue calcifications. IMPRESSION: No displaced fracture or dislocation is seen in left shoulder. Electronically Signed   By: Elmer Picker M.D.   On: 12/25/2021 20:23       Discharge Exam: Vitals:   12/27/21 0425 12/27/21 0946  BP: (!) 145/64 (!) 158/71  Pulse: 68 70  Resp: 19 18  Temp: 98.5 F (36.9 C) 98.1 F (36.7 C)  SpO2: 97% 97%    General: Pt is alert, awake, not in acute distress Cardiovascular: RRR, S1/S2 +, no edema Respiratory: CTA bilaterally, no wheezing, no rhonchi, no respiratory distress, no conversational dyspnea  Abdominal: Soft, NT, ND, bowel sounds + Extremities: no edema, no cyanosis Psych: Normal mood and affect, stable judgement and insight     The results of significant diagnostics from this hospitalization (including imaging, microbiology, ancillary and laboratory) are listed below for reference.     Microbiology: No results found for this or any previous visit (from the past 240 hour(s)).   Labs: BNP (last 3 results) No results for input(s): "BNP" in the last 8760 hours. Basic Metabolic Panel: Recent Labs  Lab 12/25/21 1941 12/26/21 0251 12/27/21 0103 12/27/21 0541  NA 136 139 136  --   K 4.4 3.4* 4.6  --   CL 102 104 105  --   CO2 20* 24 19*  --   GLUCOSE 114* 120* 85  --   BUN '18 17 12  '$ --   CREATININE 1.07* 1.03* 0.91  --   CALCIUM 8.7* 8.2* 7.9*  --   MG  --  1.7  --  1.6*   Liver Function Tests: Recent Labs  Lab 12/25/21 1941 12/26/21 0251 12/27/21 0541  AST 183* 225* 247*  ALT 50* 60* 74*  ALKPHOS 39 34* 29*  BILITOT 0.7 0.5 0.6  PROT 6.5 5.9* 5.4*  ALBUMIN 3.9 3.1* 2.7*   No results for input(s): "LIPASE", "AMYLASE" in the last 168 hours. No results for input(s): "AMMONIA" in the last 168 hours. CBC: Recent Labs  Lab 12/25/21 1941 12/26/21 0251  WBC 7.5 6.0  NEUTROABS  --  3.9  HGB 13.2 11.9*  HCT 40.5 37.2  MCV 93.1 95.4  PLT 148*  134*   Cardiac Enzymes: Recent Labs  Lab 12/25/21 1941 12/26/21 0251 12/27/21 0541  CKTOTAL 6,114* 10,124* 5,912*  BNP: Invalid input(s): "POCBNP" CBG: No results for input(s): "GLUCAP" in the last 168 hours. D-Dimer No results for input(s): "DDIMER" in the last 72 hours. Hgb A1c No results for input(s): "HGBA1C" in the last 72 hours. Lipid Profile No results for input(s): "CHOL", "HDL", "LDLCALC", "TRIG", "CHOLHDL", "LDLDIRECT" in the last 72 hours. Thyroid function studies No results for input(s): "TSH", "T4TOTAL", "T3FREE", "THYROIDAB" in the last 72 hours.  Invalid input(s): "FREET3" Anemia work up No results for input(s): "VITAMINB12", "FOLATE", "FERRITIN", "TIBC", "IRON", "RETICCTPCT" in the last 72 hours. Urinalysis    Component Value Date/Time   COLORURINE YELLOW 12/26/2021 0747   APPEARANCEUR HAZY (A) 12/26/2021 0747   LABSPEC 1.016 12/26/2021 0747   PHURINE 6.0 12/26/2021 0747   GLUCOSEU NEGATIVE 12/26/2021 0747   GLUCOSEU NEGATIVE 02/28/2018 1356   HGBUR MODERATE (A) 12/26/2021 0747   BILIRUBINUR NEGATIVE 12/26/2021 0747   BILIRUBINUR neg 03/13/2016 1123   KETONESUR NEGATIVE 12/26/2021 0747   PROTEINUR 30 (A) 12/26/2021 0747   UROBILINOGEN 0.2 02/28/2018 1356   NITRITE NEGATIVE 12/26/2021 0747   LEUKOCYTESUR TRACE (A) 12/26/2021 0747   Sepsis Labs Recent Labs  Lab 12/25/21 1941 12/26/21 0251  WBC 7.5 6.0   Microbiology No results found for this or any previous visit (from the past 240 hour(s)).   Patient was seen and examined on the day of discharge and was found to be in stable condition. Time coordinating discharge: 35 minutes including assessment and coordination of care, as well as examination of the patient.   SIGNED:  Dessa Phi, DO [facility] 12/27/2021, 12:03 PM

## 2021-12-27 NOTE — Progress Notes (Signed)
   12/26/21 2152  Provider Notification  Provider Name/Title Dr. Irene Pap MD  Date Provider Notified 12/26/21  Time Provider Notified 2152  Method of Notification Page  Notification Reason Change in status  Provider response See new orders  Date of Provider Response 12/26/21   During the night, the patient's was NSR with BBB on the telemetry monitor.  She was also having frequent PVC and PACs.  She did have several runs of VTACH with the longest being a 26 beat run of [place].  Patient had no complaints of chest pain.  VS were stable.  She was very anxious.  Family had requested that her home medication of xanax 0.50 mg be restarted for bedtime.  Dr. [place] Crane made aware.  Received order for xanax 0.25 mg which was given with minimum results.  Stat EKG done and Dr. Nevada Crane to the floor to assess the patient.  PO Potassium given per MD order.  Will continue to monitor patient.  Earleen Reaper RN

## 2021-12-28 ENCOUNTER — Telehealth: Payer: Self-pay | Admitting: Internal Medicine

## 2021-12-28 NOTE — Telephone Encounter (Signed)
Deanna Olson from [facility] - needs orders for home health nursing to start immediately.  Please fax to   [phone]

## 2021-12-29 ENCOUNTER — Telehealth: Payer: Self-pay

## 2021-12-29 NOTE — Telephone Encounter (Signed)
Can give conditional that she keep apt 12/31/21 because I have not seen since 2022

## 2021-12-29 NOTE — Telephone Encounter (Signed)
Transition Care Management Follow-up Telephone Call Date of discharge and from where: 12/27/2021 from [facility] How have you been since you were released from the hospital? "Still sore, but feeling better" Diagnosis: W01.0XXA Fall, M62.82 Rhabdomyolysis Any questions or concerns? No  Items Reviewed: Did the pt receive and understand the discharge instructions provided? Yes  Medications obtained and verified? Yes  Other? No  Any new allergies since your discharge? No  Dietary orders reviewed? No; Regular Diet Do you have support at home? Yes , Daughter  [place] and Equipment/Supplies: Were home health services ordered? yes If so, what is the name of the agency? [facility]  Has the agency set up a time to come to the patient's home? No; scheduled for 01/04/2022 Were any new equipment or medical supplies ordered?  Yes: Bedside [employer]  What is the name of the medical supply agency? [facility] Were you able to get the supplies/equipment? yes Do you have any questions related to the use of the equipment or supplies? No  Functional Questionnaire: (I = Independent and D = Dependent) ADLs: I  Bathing/Dressing- I  Meal Prep- I; daughter is providing meals at this time  Eating- I  Maintaining continence- I  Transferring/Ambulation- I; uses a walker  Managing Meds- I  Follow up appointments reviewed:  PCP Hospital f/u appt confirmed? Yes  Scheduled to see Pricilla Holm, MD on 12/31/2021 @ 10:40 am. [facility] f/u appt confirmed? No  Are transportation arrangements needed? No  If their condition worsens, is the pt aware to call PCP or go to the Emergency Dept.? Yes Was the patient provided with contact information for the PCP's office or ED? Yes Was to pt encouraged to call back with questions or concerns? Yes

## 2021-12-31 ENCOUNTER — Ambulatory Visit (INDEPENDENT_AMBULATORY_CARE_PROVIDER_SITE_OTHER): Payer: PPO

## 2021-12-31 ENCOUNTER — Encounter: Payer: Self-pay | Admitting: Internal Medicine

## 2021-12-31 ENCOUNTER — Ambulatory Visit (INDEPENDENT_AMBULATORY_CARE_PROVIDER_SITE_OTHER): Payer: PPO | Admitting: Internal Medicine

## 2021-12-31 VITALS — BP 130/74 | HR 60 | Resp 18 | Ht 64.0 in | Wt 153.4 lb

## 2021-12-31 DIAGNOSIS — N1831 Chronic kidney disease, stage 3a: Secondary | ICD-10-CM

## 2021-12-31 DIAGNOSIS — T796XXA Traumatic ischemia of muscle, initial encounter: Secondary | ICD-10-CM | POA: Diagnosis not present

## 2021-12-31 DIAGNOSIS — R052 Subacute cough: Secondary | ICD-10-CM | POA: Diagnosis not present

## 2021-12-31 DIAGNOSIS — R059 Cough, unspecified: Secondary | ICD-10-CM | POA: Diagnosis not present

## 2021-12-31 LAB — COMPREHENSIVE METABOLIC PANEL
ALT: 73 U/L — ABNORMAL HIGH (ref 0–35)
AST: 101 U/L — ABNORMAL HIGH (ref 0–37)
Albumin: 3.9 g/dL (ref 3.5–5.2)
Alkaline Phosphatase: 38 U/L — ABNORMAL LOW (ref 39–117)
BUN: 14 mg/dL (ref 6–23)
CO2: 28 mEq/L (ref 19–32)
Calcium: 9.4 mg/dL (ref 8.4–10.5)
Chloride: 101 mEq/L (ref 96–112)
Creatinine, Ser: 0.9 mg/dL (ref 0.40–1.20)
GFR: 54.96 mL/min — ABNORMAL LOW (ref >60.00)
Glucose, Bld: 95 mg/dL (ref 70–99)
Potassium: 4.4 mEq/L (ref 3.5–5.1)
Sodium: 139 mEq/L (ref 135–145)
Total Bilirubin: 0.5 mg/dL (ref 0.2–1.2)
Total Protein: 6.9 g/dL (ref 6.0–8.3)

## 2021-12-31 LAB — CBC
HCT: 38.2 % (ref 36.0–46.0)
Hemoglobin: 12.6 g/dL (ref 12.0–15.0)
MCHC: 33 g/dL (ref 30.0–36.0)
MCV: 91.9 fl (ref 78.0–100.0)
Platelets: 155 10*3/uL (ref 150.0–400.0)
RBC: 4.15 Mil/uL (ref 3.87–5.11)
RDW: 16 % — ABNORMAL HIGH (ref 11.5–15.5)
WBC: 5.7 10*3/uL (ref 4.0–10.5)

## 2021-12-31 LAB — MAGNESIUM: Magnesium: 1.7 mg/dL (ref 1.5–2.5)

## 2021-12-31 LAB — CK: Total CK: 402 U/L — ABNORMAL HIGH (ref 7–177)

## 2021-12-31 MED ORDER — MIRABEGRON ER 50 MG PO TB24
50.0000 mg | ORAL_TABLET | Freq: Every day | ORAL | 3 refills | Status: DC
Start: 2021-12-31 — End: 2022-12-29

## 2021-12-31 MED ORDER — GABAPENTIN 100 MG PO CAPS: 100.0000 mg | ORAL_CAPSULE | Freq: Every morning | ORAL | 3 refills | Status: DC

## 2021-12-31 NOTE — Progress Notes (Signed)
   Subjective:   Patient ID: Deanna Olson, female    DOB: 1926-05-22, 88 y.o.   MRN: [id number]  HPI The patient is a 88 YO female coming in for hospital follow up.   Review of Systems  Constitutional:  Positive for activity change, appetite change and fatigue.  HENT: Negative.    Eyes: Negative.   Respiratory:  Negative for cough, chest tightness and shortness of breath.   Cardiovascular:  Negative for chest pain, palpitations and leg swelling.  Gastrointestinal:  Negative for abdominal distention, abdominal pain, constipation, diarrhea, nausea and vomiting.  Musculoskeletal:  Positive for myalgias.  Skin: Negative.   Neurological: Negative.   Psychiatric/Behavioral: Negative.      Objective:  Physical Exam Constitutional:      Appearance: She is well-developed.  HENT:     Head: Normocephalic and atraumatic.  Cardiovascular:     Rate and Rhythm: Normal rate and regular rhythm.  Pulmonary:     Effort: Pulmonary effort is normal. No respiratory distress.     Breath sounds: Normal breath sounds. No wheezing or rales.  Abdominal:     General: Bowel sounds are normal. There is no distension.     Palpations: Abdomen is soft.     Tenderness: There is no abdominal tenderness. There is no rebound.  Musculoskeletal:        General: Tenderness present.     Cervical back: Normal range of motion.     Comments: Left sided bruising with tenderness  Skin:    General: Skin is warm and dry.  Neurological:     Mental Status: She is alert and oriented to person, place, and time.     Coordination: Coordination normal.     Vitals:   12/31/21 1036  BP: 130/74  Pulse: 60  Resp: 18  SpO2: 96%  Weight: 153 lb 6.4 oz (69.6 kg)  Height: '5\' 4"'$  (1.626 m)    Assessment & Plan:

## 2021-12-31 NOTE — Telephone Encounter (Signed)
Will fax over once we receive it.

## 2021-12-31 NOTE — Patient Instructions (Signed)
We have sent in gabapentin 100 mg to add in the morning/lunch time.   We have sent in myrbetriq to take 1 pill daily for the bladder control.

## 2022-01-01 ENCOUNTER — Encounter: Payer: Self-pay | Admitting: Internal Medicine

## 2022-01-01 NOTE — Assessment & Plan Note (Signed)
Checking magnesium level today and adjust as needed.

## 2022-01-01 NOTE — Assessment & Plan Note (Signed)
Chest x-ray ordered as this was not addressed in most recent hospital stay and present for several months.

## 2022-01-01 NOTE — Assessment & Plan Note (Signed)
Needs close monitoring of renal function given recent rhabdomyolysis.

## 2022-01-01 NOTE — Assessment & Plan Note (Signed)
Checking CK, CMP, and CBC for improvement and depending on results may need repeat in several weeks/months. Overall clinically gradually improving.

## 2022-01-04 DIAGNOSIS — N1831 Chronic kidney disease, stage 3a: Secondary | ICD-10-CM | POA: Diagnosis not present

## 2022-01-04 DIAGNOSIS — E782 Mixed hyperlipidemia: Secondary | ICD-10-CM | POA: Diagnosis not present

## 2022-01-04 DIAGNOSIS — K219 Gastro-esophageal reflux disease without esophagitis: Secondary | ICD-10-CM | POA: Diagnosis not present

## 2022-01-04 DIAGNOSIS — M6282 Rhabdomyolysis: Secondary | ICD-10-CM | POA: Diagnosis not present

## 2022-01-04 DIAGNOSIS — W19XXXD Unspecified fall, subsequent encounter: Secondary | ICD-10-CM | POA: Diagnosis not present

## 2022-01-04 DIAGNOSIS — I129 Hypertensive chronic kidney disease with stage 1 through stage 4 chronic kidney disease, or unspecified chronic kidney disease: Secondary | ICD-10-CM | POA: Diagnosis not present

## 2022-01-04 DIAGNOSIS — E039 Hypothyroidism, unspecified: Secondary | ICD-10-CM | POA: Diagnosis not present

## 2022-01-07 ENCOUNTER — Other Ambulatory Visit: Payer: Self-pay | Admitting: Internal Medicine

## 2022-01-07 NOTE — Telephone Encounter (Signed)
Check [place] registry last filled 11/05/2021.Marland KitchenJohny Chess

## 2022-01-13 DIAGNOSIS — E782 Mixed hyperlipidemia: Secondary | ICD-10-CM | POA: Diagnosis not present

## 2022-01-13 DIAGNOSIS — N1831 Chronic kidney disease, stage 3a: Secondary | ICD-10-CM | POA: Diagnosis not present

## 2022-01-13 DIAGNOSIS — E039 Hypothyroidism, unspecified: Secondary | ICD-10-CM | POA: Diagnosis not present

## 2022-01-13 DIAGNOSIS — W19XXXD Unspecified fall, subsequent encounter: Secondary | ICD-10-CM | POA: Diagnosis not present

## 2022-01-13 DIAGNOSIS — I129 Hypertensive chronic kidney disease with stage 1 through stage 4 chronic kidney disease, or unspecified chronic kidney disease: Secondary | ICD-10-CM | POA: Diagnosis not present

## 2022-01-13 DIAGNOSIS — K219 Gastro-esophageal reflux disease without esophagitis: Secondary | ICD-10-CM | POA: Diagnosis not present

## 2022-01-13 DIAGNOSIS — M6282 Rhabdomyolysis: Secondary | ICD-10-CM | POA: Diagnosis not present

## 2022-01-17 ENCOUNTER — Other Ambulatory Visit: Payer: Self-pay | Admitting: Internal Medicine

## 2022-01-28 ENCOUNTER — Ambulatory Visit: Payer: PPO | Admitting: Family Medicine

## 2022-02-01 ENCOUNTER — Ambulatory Visit (INDEPENDENT_AMBULATORY_CARE_PROVIDER_SITE_OTHER): Payer: PPO | Admitting: Internal Medicine

## 2022-02-01 ENCOUNTER — Ambulatory Visit (INDEPENDENT_AMBULATORY_CARE_PROVIDER_SITE_OTHER): Payer: PPO

## 2022-02-01 VITALS — BP 142/84 | HR 60 | Temp 98.2°F | Ht 64.0 in | Wt 150.0 lb

## 2022-02-01 DIAGNOSIS — I350 Nonrheumatic aortic (valve) stenosis: Secondary | ICD-10-CM | POA: Diagnosis not present

## 2022-02-01 DIAGNOSIS — R051 Acute cough: Secondary | ICD-10-CM

## 2022-02-01 DIAGNOSIS — I10 Essential (primary) hypertension: Secondary | ICD-10-CM | POA: Diagnosis not present

## 2022-02-01 DIAGNOSIS — R062 Wheezing: Secondary | ICD-10-CM | POA: Diagnosis not present

## 2022-02-01 DIAGNOSIS — N1831 Chronic kidney disease, stage 3a: Secondary | ICD-10-CM | POA: Diagnosis not present

## 2022-02-01 DIAGNOSIS — R0602 Shortness of breath: Secondary | ICD-10-CM | POA: Diagnosis not present

## 2022-02-01 DIAGNOSIS — R059 Cough, unspecified: Secondary | ICD-10-CM | POA: Diagnosis not present

## 2022-02-01 MED ORDER — HYDROCODONE BIT-HOMATROP MBR 5-1.5 MG/5ML PO SOLN: 5.0000 mL | Freq: Four times a day (QID) | ORAL | 0 refills | Status: AC | PRN

## 2022-02-01 NOTE — Patient Instructions (Addendum)
Please take all new medication as prescribed - the cough medicine as needed (more at night)  Please continue all other medications as before, and refills have been done if requested.  Please have the pharmacy call with any other refills you may need.  Please keep your appointments with your specialists as you may have planned  You will be contacted regarding the referral for: Echocardiogram, and Dr Percival Spanish  Please go to the XRAY Department in the first floor for the x-ray testing  You will be contacted by phone if any changes need to be made immediately.  Otherwise, you will receive a letter about your results with an explanation, but please check with MyChart first.  Please remember to sign up for MyChart if you have not done so, as this will be important to you in the future with finding out test results, communicating by private email, and scheduling acute appointments online when needed.

## 2022-02-01 NOTE — Progress Notes (Unsigned)
Patient ID: Deanna Olson, female   DOB: March 31, 1926, 88 y.o.   MRN: [id number]        Chief Complaint: follow up persistent cough       HPI:  Deanna Olson is a 88 y.o. female here with c/o persistent cough non prod with mild hoarseness for unclear reason x 2-3 wks - Pt denies chest pain, increased sob or doe, wheezing, orthopnea, PND, increased LE swelling, palpitations, dizziness or syncope.   Pt denies polydipsia, polyuria, or new focal neuro s/s.    Pt denies fever, ST, wt loss, night sweats, loss of appetite, or other constitutional symptoms  Has inhaler at home but not felt need to use it.  Cough overall worse at night, cant sleep well and family keen on cough med to control this as OTC not working.  Of note, pt has hx of AS, mild  -last echo x 4 yrs.   Did have a fall over a door threshhold, hospd x 2 days, overall improved stamina with PT/Ot at home.       Wt Readings from Last 3 Encounters:  02/01/22 150 lb (68 kg)  12/31/21 153 lb 6.4 oz (69.6 kg)  12/27/21 161 lb 13.1 oz (73.4 kg)   BP Readings from Last 3 Encounters:  02/01/22 (!) 142/84  12/31/21 130/74  12/27/21 (!) 158/71         Past Medical History:  Diagnosis Date   Allergic rhinitis, cause unspecified    Anxiety state, unspecified    Aortic valve disorders    Mild AS 2013 echo    Eczema    Esophageal reflux    Herpes zoster without mention of complication    Hyperlipidemia    Hypertension    Hypothyroid    Osteoarthrosis, unspecified whether generalized or localized, unspecified site    Unspecified venous (peripheral) insufficiency    Past Surgical History:  Procedure Laterality Date   CATARACT EXTRACTION, BILATERAL  2013   KYPHOPLASTY  02/2018   VESICOVAGINAL FISTULA CLOSURE W/ TAH      reports that she has never smoked. She has never used smokeless tobacco. She reports that she does not drink alcohol and does not use drugs. family history includes CAD (age of onset: 65) in her brother; Cancer in her sister;  Diabetes in her brother and mother; Heart attack (age of onset: 26) in her brother; Stroke (age of onset: 6) in her mother. Allergies  Allergen Reactions   Sulfonamide Derivatives Swelling    Angioedema    Current Outpatient Medications on File Prior to Visit  Medication Sig Dispense Refill   acetaminophen (TYLENOL) 500 MG tablet Take 500 mg by mouth daily as needed for moderate pain.     alendronate (FOSAMAX) 70 MG tablet Take 1 tablet (70 mg total) by mouth once a week. Take with a full glass of water on an empty stomach. (Patient taking differently: Take 70 mg by mouth every Wednesday.) 12 tablet 3   ALPRAZolam (XANAX) 0.5 MG tablet TAKE ONE TABLET BY MOUTH TWICE A DAY 60 tablet 2   Apoaequorin (PREVAGEN PO) Take 1 capsule by mouth daily.     Ascorbic Acid (VITAMIN C PO) Take 1 tablet by mouth daily.     b complex vitamins capsule Take 1 capsule by mouth daily.     betamethasone dipropionate 0.05 % lotion Apply topically daily as needed. Uses on scalp and ears 60 mL 2   Bioflavonoid Products (BIOFLEX) TABS Take 1 tablet by mouth daily.  Cholecalciferol (VITAMIN D-3 PO) Take 1 capsule by mouth daily.     diphenhydramine-acetaminophen (TYLENOL PM) 25-500 MG TABS tablet Take 1 tablet by mouth at bedtime as needed (pain, sleep).     gabapentin (NEURONTIN) 100 MG capsule Take 1 capsule (100 mg total) by mouth in the morning. 90 capsule 3   gabapentin (NEURONTIN) 300 MG capsule TAKE ONE CAPSULE BY MOUTH EVERY NIGHT AT BEDTIME 90 capsule 1   guaiFENesin-dextromethorphan (ROBITUSSIN DM) 100-10 MG/5ML syrup Take 5 mLs by mouth every 4 (four) hours as needed for cough. 118 mL 0   ketoconazole (NIZORAL) 2 % shampoo USE TO SHAMPOO TWICE WEEKLY (Patient taking differently: Apply 1 Application topically every Friday.) 100 mL 0   levothyroxine (SYNTHROID) 50 MCG tablet Take 1 tablet (50 mcg total) by mouth daily before breakfast. 90 tablet 0   Lidocaine 4 % PTCH Apply 1 patch topically daily.      lisinopril (ZESTRIL) 20 MG tablet TAKE ONE TABLET BY MOUTH DAILY (Patient taking differently: Take 20 mg by mouth daily.) 90 tablet 1   mirabegron ER (MYRBETRIQ) 50 MG TB24 tablet Take 1 tablet (50 mg total) by mouth daily. 90 tablet 3   Multiple Vitamins-Minerals (PRESERVISION AREDS 2) CAPS Take 1 capsule by mouth daily.     omeprazole (PRILOSEC) 20 MG capsule TAKE ONE CAPSULE BY MOUTH DAILY ON AN EMPTY STOMACH (Patient taking differently: Take 20 mg by mouth daily.) 90 capsule 1   Polyethyl Glycol-Propyl Glycol 0.4-0.3 % SOLN Apply 1 drop to eye 2 (two) times daily as needed (dry eyes).     POTASSIUM PO Take 1 tablet by mouth daily.     pravastatin (PRAVACHOL) 20 MG tablet TAKE ONE TABLET BY MOUTH DAILY (Patient taking differently: Take 20 mg by mouth daily.) 90 tablet 1   VITAMIN E PO Take 1 capsule by mouth daily.     No current facility-administered medications on file prior to visit.        ROS:  All others reviewed and negative.  Objective        PE:  BP (!) 142/84 (BP Location: Left Arm, Patient Position: Sitting, Cuff Size: Large)   Pulse 60   Temp 98.2 F (36.8 C) (Oral)   Ht '5\' 4"'$  (1.626 m)   Wt 150 lb (68 kg)   SpO2 95%   BMI 25.75 kg/m                 Constitutional: Pt appears in NAD               HENT: Head: NCAT.                Right Ear: External ear normal.                 Left Ear: External ear normal.                Eyes: . Pupils are equal, round, and reactive to light. Conjunctivae and EOM are normal               Nose: without d/c or deformity               Neck: Neck supple. Gross normal ROM               Cardiovascular: Normal rate and regular rhythm.  With Gr 2-3/6 sys murmur               Pulmonary/Chest: Effort normal and breath sounds without  rales or wheezing.                Abd:  Soft, NT, ND, + BS, no organomegaly               Neurological: Pt is alert. At baseline orientation, motor grossly intact               Skin: Skin is warm. No rashes, no  other new lesions, LE edema - none               Psychiatric: Pt behavior is normal without agitation   Micro: none  Cardiac tracings I have personally interpreted today:  none  Pertinent Radiological findings (summarize): none   Lab Results  Component Value Date   WBC 5.7 12/31/2021   HGB 12.6 12/31/2021   HCT 38.2 12/31/2021   PLT 155.0 12/31/2021   GLUCOSE 95 12/31/2021   CHOL 237 (H) 08/14/2020   TRIG 347.0 (H) 08/14/2020   HDL 47.40 08/14/2020   LDLDIRECT 161.0 08/14/2020   LDLCALC 151 (H) 10/20/2016   ALT 73 (H) 12/31/2021   AST 101 (H) 12/31/2021   NA 139 12/31/2021   K 4.4 12/31/2021   CL 101 12/31/2021   CREATININE 0.90 12/31/2021   BUN 14 12/31/2021   CO2 28 12/31/2021   TSH 2.82 08/14/2020   HGBA1C 5.2 05/18/2019   Assessment/Plan:  Kyliana Standen is a 88 y.o. White or Caucasian [1] female with  has a past medical history of Allergic rhinitis, cause unspecified, Anxiety state, unspecified, Aortic valve disorders, Eczema, Esophageal reflux, Herpes zoster without mention of complication, Hyperlipidemia, Hypertension, Hypothyroid, Osteoarthrosis, unspecified whether generalized or localized, unspecified site, and Unspecified venous (peripheral) insufficiency.  Aortic stenosis, mild With recent fall and atypical cough, will check f/u echo, f/u cardiology as well  Cough Atypical, etiolgoy unclear, for symptomatic med prn for now, also cxr  Essential hypertension BP Readings from Last 3 Encounters:  02/01/22 (!) 142/84  12/31/21 130/74  12/27/21 (!) 158/71   Mild uncontrolled today, pt to continue medical treatment lisinopril 20 mg qd as declines other change today   Chronic kidney disease, stage 3a (HCC) Lab Results  Component Value Date   CREATININE 0.90 12/31/2021   Stable overall, cont to avoid nephrotoxins  Followup: Return if symptoms worsen or fail to improve.  Cathlean Cower, MD 02/03/2022 8:55 PM [facility]

## 2022-02-03 ENCOUNTER — Encounter: Payer: Self-pay | Admitting: Internal Medicine

## 2022-02-03 NOTE — Assessment & Plan Note (Signed)
Lab Results  Component Value Date   CREATININE 0.90 12/31/2021   Stable overall, cont to avoid nephrotoxins

## 2022-02-03 NOTE — Assessment & Plan Note (Signed)
BP Readings from Last 3 Encounters:  02/01/22 (!) 142/84  12/31/21 130/74  12/27/21 (!) 158/71   Mild uncontrolled today, pt to continue medical treatment lisinopril 20 mg qd as declines other change today

## 2022-02-03 NOTE — Assessment & Plan Note (Signed)
With recent fall and atypical cough, will check f/u echo, f/u cardiology as well

## 2022-02-03 NOTE — Assessment & Plan Note (Signed)
Atypical, etiolgoy unclear, for symptomatic med prn for now, also cxr

## 2022-02-04 ENCOUNTER — Telehealth: Payer: Self-pay

## 2022-02-04 DIAGNOSIS — J849 Interstitial pulmonary disease, unspecified: Secondary | ICD-10-CM

## 2022-02-04 NOTE — Telephone Encounter (Signed)
Pt was discharged from PT today.   Pt was recently prescribed  HYDROcodone bit-homatropine (HYCODAN) 5-1.5 MG/5ML syrup this week and it interacts with gabapentin (NEURONTIN) 300 MG capsule and ALPRAZolam (XANAX) 0.5 MG tablet.  CXR showed she had interstitial lung disease and they want to know if she should be seeing PUL instead CARD  Redness on Left eye in comparison to Right eye  If there are any recommendation please reach out to the family

## 2022-02-05 NOTE — Telephone Encounter (Signed)
Ted Mcalpine is calling for an update on the message left yesterday.  I was able to advise her that Dr. Jenny Reichmann says Ok to continue all medication as prescribed, ok for pulm referral and it was placed today but no new recommendations about eyes.    I advised her that the pt would need to make an appt for eye treatment.  FYI

## 2022-02-05 NOTE — Telephone Encounter (Signed)
Ok to continue all medication as prescribed    No new recommendations about eyes.    Ok for pulm referral

## 2022-02-05 NOTE — Telephone Encounter (Signed)
Will forward to provider who ordered medication and placed referral.

## 2022-02-11 DIAGNOSIS — D2239 Melanocytic nevi of other parts of face: Secondary | ICD-10-CM | POA: Diagnosis not present

## 2022-02-11 DIAGNOSIS — Z09 Encounter for follow-up examination after completed treatment for conditions other than malignant neoplasm: Secondary | ICD-10-CM | POA: Diagnosis not present

## 2022-02-11 DIAGNOSIS — L821 Other seborrheic keratosis: Secondary | ICD-10-CM | POA: Diagnosis not present

## 2022-02-11 DIAGNOSIS — L578 Other skin changes due to chronic exposure to nonionizing radiation: Secondary | ICD-10-CM | POA: Diagnosis not present

## 2022-02-16 ENCOUNTER — Encounter: Payer: Self-pay | Admitting: Internal Medicine

## 2022-02-16 ENCOUNTER — Ambulatory Visit (INDEPENDENT_AMBULATORY_CARE_PROVIDER_SITE_OTHER): Payer: PPO | Admitting: Internal Medicine

## 2022-02-16 VITALS — BP 120/70 | HR 62 | Temp 97.7°F | Ht 64.0 in | Wt 150.0 lb

## 2022-02-16 DIAGNOSIS — N1831 Chronic kidney disease, stage 3a: Secondary | ICD-10-CM | POA: Diagnosis not present

## 2022-02-16 DIAGNOSIS — M545 Low back pain, unspecified: Secondary | ICD-10-CM | POA: Diagnosis not present

## 2022-02-16 DIAGNOSIS — K219 Gastro-esophageal reflux disease without esophagitis: Secondary | ICD-10-CM | POA: Diagnosis not present

## 2022-02-16 DIAGNOSIS — Z0001 Encounter for general adult medical examination with abnormal findings: Secondary | ICD-10-CM

## 2022-02-16 DIAGNOSIS — I10 Essential (primary) hypertension: Secondary | ICD-10-CM

## 2022-02-16 DIAGNOSIS — E782 Mixed hyperlipidemia: Secondary | ICD-10-CM | POA: Diagnosis not present

## 2022-02-16 DIAGNOSIS — F411 Generalized anxiety disorder: Secondary | ICD-10-CM | POA: Diagnosis not present

## 2022-02-16 DIAGNOSIS — E039 Hypothyroidism, unspecified: Secondary | ICD-10-CM

## 2022-02-16 DIAGNOSIS — R052 Subacute cough: Secondary | ICD-10-CM

## 2022-02-16 DIAGNOSIS — Z0000 Encounter for general adult medical examination without abnormal findings: Secondary | ICD-10-CM

## 2022-02-16 NOTE — Progress Notes (Unsigned)
   Subjective:   Patient ID: Deanna Olson, female    DOB: 06-Mar-1926, 88 y.o.   MRN: [id number]  HPI The patient is a 88 YO female coming in for physical.   PMH, [place], social history reviewed and updated  Review of Systems  Constitutional:  Positive for activity change.  HENT: Negative.    Eyes: Negative.   Respiratory:  Positive for cough. Negative for chest tightness and shortness of breath.   Cardiovascular:  Negative for chest pain, palpitations and leg swelling.  Gastrointestinal:  Negative for abdominal distention, abdominal pain, constipation, diarrhea, nausea and vomiting.  Musculoskeletal:  Positive for arthralgias and back pain.  Skin: Negative.   Neurological: Negative.   Psychiatric/Behavioral:  The patient is nervous/anxious.     Objective:  Physical Exam Constitutional:      Appearance: She is well-developed.  HENT:     Head: Normocephalic and atraumatic.  Cardiovascular:     Rate and Rhythm: Normal rate and regular rhythm.  Pulmonary:     Effort: Pulmonary effort is normal. No respiratory distress.     Breath sounds: Normal breath sounds. No wheezing or rales.  Abdominal:     General: Bowel sounds are normal. There is no distension.     Palpations: Abdomen is soft.     Tenderness: There is no abdominal tenderness. There is no rebound.  Musculoskeletal:        General: Tenderness present.     Cervical back: Normal range of motion.  Skin:    General: Skin is warm and dry.  Neurological:     Mental Status: She is alert and oriented to person, place, and time.     Coordination: Coordination abnormal.     Vitals:   02/16/22 1334  BP: 120/70  Pulse: 62  Temp: 97.7 F (36.5 C)  TempSrc: Oral  SpO2: 94%  Weight: 150 lb (68 kg)  Height: '5\' 4"'$  (1.626 m)    Assessment & Plan:

## 2022-02-16 NOTE — Patient Instructions (Addendum)
We will see what the ultrasound of the heart shows.  Let us know if the coughing gets worse again.  It is okay to take the alprazolam twice a day.

## 2022-02-17 NOTE — Assessment & Plan Note (Signed)
Taking pravastatin 20 mg daily and will continue.

## 2022-02-17 NOTE — Assessment & Plan Note (Signed)
Using lidocaine patches and gabapentin for pain with reasonable relief. This is causing some decrease in QOL. Reminded she had a bad fall this summer which could have caused some worsening which may improve gradually.

## 2022-02-17 NOTE — Assessment & Plan Note (Signed)
Levels stable recently will continue levothyroxine 50 mcg daily.

## 2022-02-17 NOTE — Assessment & Plan Note (Signed)
Flu shot yearly. Covid-19 counseled. Pneumonia up to date. Shingrix counseled. Tetanus up to date. Colonoscopy aged out. Mammogram aged out, pap smear aged out and dexa complete. Counseled about sun safety and mole surveillance. Counseled about the dangers of distracted driving. Given 10 year screening recommendations.

## 2022-02-17 NOTE — Assessment & Plan Note (Signed)
Taking omeprazole 20 mg daily and offered increased treatment given cough and she declines for now.

## 2022-02-17 NOTE — Assessment & Plan Note (Signed)
Overall stable recently.

## 2022-02-17 NOTE — Assessment & Plan Note (Addendum)
Could be combination of GERD and post nasal drip.She is on ACE-I which can cause. They did not want medication change today. She is improving slightly. Will follow up echo done later this week as well as check in with cardiology and pulmonary. Recent CXR normal with some scar tissue. Multiple prior lung infections.

## 2022-02-17 NOTE — Assessment & Plan Note (Signed)
BP at goal on lisinopril 20 mg daily. Recent labs stable. Continue at same dosing.

## 2022-02-17 NOTE — Assessment & Plan Note (Signed)
Struggling more with anxiety and uses alprazolam daily in the evening. Advised she can try 1/2 in the morning as well to help more with QOL.

## 2022-02-19 ENCOUNTER — Ambulatory Visit: Payer: PPO

## 2022-02-19 ENCOUNTER — Ambulatory Visit (HOSPITAL_COMMUNITY): Payer: PPO | Attending: Internal Medicine

## 2022-02-19 DIAGNOSIS — I350 Nonrheumatic aortic (valve) stenosis: Secondary | ICD-10-CM | POA: Insufficient documentation

## 2022-02-19 LAB — ECHOCARDIOGRAM COMPLETE
AR max vel: 0.99 cm2
AV Area VTI: 0.99 cm2
AV Area mean vel: 0.96 cm2
AV Mean grad: 21 mmHg
AV Peak grad: 38.2 mmHg
Ao pk vel: 3.09 m/s
Area-P 1/2: 2.2 cm2
S' Lateral: 2.8 cm

## 2022-02-23 DIAGNOSIS — M8008XA Age-related osteoporosis with current pathological fracture, vertebra(e), initial encounter for fracture: Secondary | ICD-10-CM | POA: Diagnosis not present

## 2022-02-24 ENCOUNTER — Other Ambulatory Visit: Payer: Self-pay | Admitting: Orthopedic Surgery

## 2022-02-24 ENCOUNTER — Ambulatory Visit
Admission: RE | Admit: 2022-02-24 | Discharge: 2022-02-24 | Disposition: A | Discharge status: Home / Self Care | Payer: PPO | Source: Ambulatory Visit | Attending: Orthopedic Surgery | Admitting: Orthopedic Surgery

## 2022-02-24 DIAGNOSIS — M545 Low back pain, unspecified: Secondary | ICD-10-CM | POA: Diagnosis not present

## 2022-02-24 DIAGNOSIS — M48061 Spinal stenosis, lumbar region without neurogenic claudication: Secondary | ICD-10-CM | POA: Diagnosis not present

## 2022-02-24 DIAGNOSIS — M8008XA Age-related osteoporosis with current pathological fracture, vertebra(e), initial encounter for fracture: Secondary | ICD-10-CM

## 2022-02-24 DIAGNOSIS — M4317 Spondylolisthesis, lumbosacral region: Secondary | ICD-10-CM | POA: Diagnosis not present

## 2022-02-24 DIAGNOSIS — R202 Paresthesia of skin: Secondary | ICD-10-CM | POA: Diagnosis not present

## 2022-02-24 DIAGNOSIS — S32030A Wedge compression fracture of third lumbar vertebra, initial encounter for closed fracture: Secondary | ICD-10-CM | POA: Diagnosis not present

## 2022-02-24 DIAGNOSIS — M4316 Spondylolisthesis, lumbar region: Secondary | ICD-10-CM | POA: Diagnosis not present

## 2022-02-26 DIAGNOSIS — M8008XA Age-related osteoporosis with current pathological fracture, vertebra(e), initial encounter for fracture: Secondary | ICD-10-CM | POA: Diagnosis not present

## 2022-02-27 ENCOUNTER — Other Ambulatory Visit: Payer: Self-pay | Admitting: Internal Medicine

## 2022-02-28 DIAGNOSIS — I447 Left bundle-branch block, unspecified: Secondary | ICD-10-CM | POA: Insufficient documentation

## 2022-02-28 NOTE — Progress Notes (Unsigned)
Cardiology Office Note   Date:  03/02/2022   ID:  Deanna Olson, DOB Apr 29, 1926, MRN [id number]  PCP:  Hoyt Koch, MD  Cardiologist:   Minus Breeding, MD Referring:  Biagio Borg, MD  Chief Complaint  Patient presents with   Aortic Stenosis      History of Present Illness: Deanna Olson is a 88 y.o. female who presents evaluation of aortic stenosis. This  was mild on echo in 2016.  It was moderate on echo earlier this month.  I last saw her remotely in 2020.  She was recently sent for an echocardiogram which demonstrates moderate aortic stenosis.  She is not limited by shortness of breath, PND or orthopnea.  She had no new palpitations, presyncope or syncope.  She is not describing any chest pressure, neck or arm discomfort.  She is limited by back pain.  She fell.  She had kyphoplasty a few days ago.  She gets around with a walker but she still lives by herself.   Past Medical History:  Diagnosis Date   Allergic rhinitis, cause unspecified    Anxiety state, unspecified    Aortic valve disorders    Mild AS 2013 echo    Eczema    Esophageal reflux    Herpes zoster without mention of complication    Hyperlipidemia    Hypertension    Hypothyroid    Osteoarthrosis, unspecified whether generalized or localized, unspecified site    Unspecified venous (peripheral) insufficiency     Past Surgical History:  Procedure Laterality Date   CATARACT EXTRACTION, BILATERAL  2013   KYPHOPLASTY  02/2018   VESICOVAGINAL FISTULA CLOSURE W/ TAH       Current Outpatient Medications  Medication Sig Dispense Refill   acetaminophen (TYLENOL) 500 MG tablet Take 500 mg by mouth daily as needed for moderate pain.     alendronate (FOSAMAX) 70 MG tablet Take 1 tablet (70 mg total) by mouth once a week. Take with a full glass of water on an empty stomach. (Patient taking differently: Take 70 mg by mouth every Wednesday.) 12 tablet 3   ALPRAZolam (XANAX) 0.5 MG tablet TAKE ONE TABLET BY  MOUTH TWICE A DAY 60 tablet 2   Apoaequorin (PREVAGEN PO) Take 1 capsule by mouth daily.     Ascorbic Acid (VITAMIN C PO) Take 1 tablet by mouth daily.     b complex vitamins capsule Take 1 capsule by mouth daily.     betamethasone dipropionate 0.05 % lotion Apply topically daily as needed. Uses on scalp and ears 60 mL 2   Bioflavonoid Products (BIOFLEX) TABS Take 1 tablet by mouth daily.     Cholecalciferol (VITAMIN D-3 PO) Take 1 capsule by mouth daily.     diphenhydramine-acetaminophen (TYLENOL PM) 25-500 MG TABS tablet Take 1 tablet by mouth at bedtime as needed (pain, sleep).     gabapentin (NEURONTIN) 100 MG capsule Take 1 capsule (100 mg total) by mouth in the morning. 90 capsule 3   gabapentin (NEURONTIN) 300 MG capsule TAKE ONE CAPSULE BY MOUTH EVERY NIGHT AT BEDTIME 90 capsule 1   hydrocortisone 2.5 % ointment Apply topically.     ketoconazole (NIZORAL) 2 % shampoo USE TO SHAMPOO TWICE WEEKLY (Patient taking differently: Apply 1 Application topically every Friday.) 100 mL 0   levothyroxine (SYNTHROID) 50 MCG tablet Take 1 tablet (50 mcg total) by mouth daily before breakfast. 90 tablet 0   Lidocaine 4 % PTCH Apply 1 patch topically daily.  lisinopril (ZESTRIL) 20 MG tablet TAKE ONE TABLET BY MOUTH DAILY (Patient taking differently: Take 20 mg by mouth daily.) 90 tablet 1   mirabegron ER (MYRBETRIQ) 50 MG TB24 tablet Take 1 tablet (50 mg total) by mouth daily. 90 tablet 3   Multiple Vitamins-Minerals (PRESERVISION AREDS 2) CAPS Take 1 capsule by mouth daily.     omeprazole (PRILOSEC) 20 MG capsule TAKE ONE CAPSULE BY MOUTH DAILY ON AN EMPTY STOMACH (Patient taking differently: Take 20 mg by mouth daily.) 90 capsule 1   Polyethyl Glycol-Propyl Glycol 0.4-0.3 % SOLN Apply 1 drop to eye 2 (two) times daily as needed (dry eyes).     POTASSIUM PO Take 1 tablet by mouth daily.     pravastatin (PRAVACHOL) 20 MG tablet TAKE ONE TABLET BY MOUTH DAILY (Patient taking differently: Take 20 mg by  mouth daily.) 90 tablet 1   VITAMIN E PO Take 1 capsule by mouth daily.     guaiFENesin-dextromethorphan (ROBITUSSIN DM) 100-10 MG/5ML syrup Take 5 mLs by mouth every 4 (four) hours as needed for cough. (Patient not taking: Reported on 03/02/2022) 118 mL 0   No current facility-administered medications for this visit.    Allergies:   Sulfonamide derivatives    Social History:  The patient  reports that she has never smoked. She has never used smokeless tobacco. She reports that she does not drink alcohol and does not use drugs.   Family History:  The patient's family history includes CAD (age of onset: 12) in her brother; Cancer in her sister; Diabetes in her brother and mother; Heart attack (age of onset: 27) in her brother; Stroke (age of onset: 63) in her mother.    ROS:  Please see the history of present illness.   Otherwise, review of systems are positive for mucus and sinus drainage.   All other systems are reviewed and negative.    PHYSICAL EXAM: VS:  BP 130/78   Pulse 92   Ht '5\' 4"'$  (1.626 m)   Wt 149 lb 3.2 oz (67.7 kg)   SpO2 97%   BMI 25.61 kg/m  , BMI Body mass index is 25.61 kg/m. GENERAL:  Well appearing HEENT:  Pupils equal round and reactive, fundi not visualized, oral mucosa unremarkable NECK:  No jugular venous distention, waveform within normal limits, carotid upstroke brisk and symmetric, no bruits, no thyromegaly LYMPHATICS:  No cervical, inguinal adenopathy LUNGS:  Clear to auscultation bilaterally BACK:  No CVA tenderness CHEST:  Unremarkable HEART:  PMI not displaced or sustained,S1 and S2 within normal limits, no S3, no S4, no clicks, no rubs, 2 out of 6 apical systolic murmur radiating up the aortic outflow tract and early mid peaking, no diastolic murmurs ABD:  Flat, positive bowel sounds normal in frequency in pitch, no bruits, no rebound, no guarding, no midline pulsatile mass, no hepatomegaly, no splenomegaly EXT:  2 plus pulses throughout, no edema, no  cyanosis no clubbing SKIN:  No rashes no nodules NEURO:  Cranial nerves II through XII grossly intact, motor grossly intact throughout PSYCH:  Cognitively intact, oriented to person place and time    EKG:  EKG is ordered today. The ekg ordered today demonstrates sinus rhythm, left bundle branch block, baseline artifact precludes adequate analysis, premature atrial contractions   Recent Labs: 12/31/2021: ALT 73; BUN 14; Creatinine, Ser 0.90; Hemoglobin 12.6; Magnesium 1.7; Platelets 155.0; Potassium 4.4; Sodium 139    Lipid Panel    Component Value Date/Time   CHOL 237 (H) 08/14/2020  1447   TRIG 347.0 (H) 08/14/2020 1447   HDL 47.40 08/14/2020 1447   CHOLHDL 5 08/14/2020 1447   VLDL 69.4 (H) 08/14/2020 1447   LDLCALC 151 (H) 10/20/2016 0853   LDLDIRECT 161.0 08/14/2020 1447      Wt Readings from Last 3 Encounters:  03/02/22 149 lb 3.2 oz (67.7 kg)  02/16/22 150 lb (68 kg)  02/01/22 150 lb (68 kg)      Other studies Reviewed: Additional studies/ records that were reviewed today include: Echocardiogram ordered by primary provider. Review of the above records demonstrates:  Please see elsewhere in the note.     ASSESSMENT AND PLAN:  Aortic stenosis: I had a conversation with the patient and her 2 daughters.  She has only moderate stenosis and no symptoms.  No intervention is indicated.  We decided we would be likely to repeat echocardiography on this in the future because she probably at her advanced age would not want to consider nor would she be appropriate potentially for TAVR.  She does want to be followed clinically however and I be happy to see her back in 1 year.  LBBB: She has had no symptomatic bradycardia arrhythmias.  No change in therapy.  HTN: Blood pressures well controlled.  No change in therapy.  Current medicines are reviewed at length with the patient today.  The patient does not have concerns regarding medicines.  The following changes have been made:   no change  Labs/ tests ordered today include: None  Orders Placed This Encounter  Procedures   EKG 12-Lead     Disposition:   FU with me in 1 year.     Signed, Minus Breeding, MD  03/02/2022 2:58 PM    [place]

## 2022-03-01 DIAGNOSIS — E785 Hyperlipidemia, unspecified: Secondary | ICD-10-CM | POA: Diagnosis not present

## 2022-03-01 DIAGNOSIS — E876 Hypokalemia: Secondary | ICD-10-CM | POA: Diagnosis not present

## 2022-03-01 DIAGNOSIS — E038 Other specified hypothyroidism: Secondary | ICD-10-CM | POA: Diagnosis not present

## 2022-03-01 DIAGNOSIS — N183 Chronic kidney disease, stage 3 unspecified: Secondary | ICD-10-CM | POA: Diagnosis not present

## 2022-03-01 DIAGNOSIS — F419 Anxiety disorder, unspecified: Secondary | ICD-10-CM | POA: Diagnosis not present

## 2022-03-01 DIAGNOSIS — J449 Chronic obstructive pulmonary disease, unspecified: Secondary | ICD-10-CM | POA: Diagnosis not present

## 2022-03-01 DIAGNOSIS — E559 Vitamin D deficiency, unspecified: Secondary | ICD-10-CM | POA: Diagnosis not present

## 2022-03-01 DIAGNOSIS — F1320 Sedative, hypnotic or anxiolytic dependence, uncomplicated: Secondary | ICD-10-CM | POA: Diagnosis not present

## 2022-03-01 DIAGNOSIS — I739 Peripheral vascular disease, unspecified: Secondary | ICD-10-CM | POA: Diagnosis not present

## 2022-03-01 DIAGNOSIS — E663 Overweight: Secondary | ICD-10-CM | POA: Diagnosis not present

## 2022-03-01 DIAGNOSIS — G63 Polyneuropathy in diseases classified elsewhere: Secondary | ICD-10-CM | POA: Diagnosis not present

## 2022-03-01 DIAGNOSIS — E039 Hypothyroidism, unspecified: Secondary | ICD-10-CM | POA: Diagnosis not present

## 2022-03-02 ENCOUNTER — Ambulatory Visit: Payer: PPO | Attending: Cardiology | Admitting: Cardiology

## 2022-03-02 ENCOUNTER — Encounter: Payer: Self-pay | Admitting: Cardiology

## 2022-03-02 VITALS — BP 130/78 | HR 92 | Ht 64.0 in | Wt 149.2 lb

## 2022-03-02 DIAGNOSIS — I447 Left bundle-branch block, unspecified: Secondary | ICD-10-CM

## 2022-03-02 DIAGNOSIS — I350 Nonrheumatic aortic (valve) stenosis: Secondary | ICD-10-CM

## 2022-03-02 DIAGNOSIS — I10 Essential (primary) hypertension: Secondary | ICD-10-CM | POA: Diagnosis not present

## 2022-03-02 NOTE — Patient Instructions (Signed)
Medication Instructions:  Your physician recommends that you continue on your current medications as directed. Please refer to the Current Medication list given to you today.  *If you need a refill on your cardiac medications before your next appointment, please call your pharmacy*  Follow-Up: At [facility], you and your health needs are our priority.  As part of our continuing mission to provide you with exceptional heart care, we have created designated Provider Care Teams.  These Care Teams include your primary Cardiologist (physician) and Advanced Practice Providers (APPs -  Physician Assistants and Nurse Practitioners) who all work together to provide you with the care you need, when you need it.  We recommend signing up for the patient portal called "MyChart".  Sign up information is provided on this After Visit Summary.  MyChart is used to connect with patients for Virtual Visits (Telemedicine).  Patients are able to view lab/test results, encounter notes, upcoming appointments, etc.  Non-urgent messages can be sent to your provider as well.   To learn more about what you can do with MyChart, go to [email or web address].    Your next appointment:   12 month(s)  The format for your next appointment:   In Person  Provider:   James Hochrein, MD      

## 2022-03-09 ENCOUNTER — Ambulatory Visit: Payer: PPO | Admitting: Internal Medicine

## 2022-03-09 ENCOUNTER — Encounter: Payer: Self-pay | Admitting: Internal Medicine

## 2022-03-09 VITALS — BP 122/68 | HR 64 | Temp 97.6°F | Ht 64.0 in | Wt 146.4 lb

## 2022-03-09 DIAGNOSIS — Z8719 Personal history of other diseases of the digestive system: Secondary | ICD-10-CM | POA: Diagnosis not present

## 2022-03-09 DIAGNOSIS — R9389 Abnormal findings on diagnostic imaging of other specified body structures: Secondary | ICD-10-CM

## 2022-03-09 DIAGNOSIS — R053 Chronic cough: Secondary | ICD-10-CM | POA: Diagnosis not present

## 2022-03-09 NOTE — Patient Instructions (Signed)
ICD-10-CM   1. Chronic cough  R05.3     2. History of gastroesophageal reflux (GERD)  Z87.19     3. Abnormal chest x-ray  R93.89       Chronic rcent cough Is not fully explained but I see lisinopril and fosafamax that makes acid reflux worse as 2 risk factors  Not sure you have scarring in your lungs based on exam and chest xray  Plan  - talk to PCP Hoyt Koch, MD and see if you can get your fosfamax and lisinopril changed to something else starting wth lisinopril first  - get HRCT supine and prone sometime next several weeks to 2 months  Followup  - DR Chase Caller in 2 months or so but after completing above changes

## 2022-03-09 NOTE — Progress Notes (Signed)
OV 03/09/2022  Subjective:  Patient ID: Deanna Olson, female , DOB: 27-Jul-1925 , age 88 y.o. , MRN: [id number] , ADDRESS: [place] [address] PCP Hoyt Koch, MD Patient Care Team: Hoyt Koch, MD as PCP - General (Internal Medicine) Minus Breeding, MD as PCP - Cardiology (Cardiology) Druscilla Brownie, MD (Dermatology) Shon Hough, MD (Ophthalmology) Delice Bison Darnelle Maffucci, [facility] (Inactive) as Pharmacist (Pharmacist)  This Provider for this visit: Treatment Team:  Attending Provider: Brand Males, MD    03/09/2022 -   Chief Complaint  Patient presents with   Consult    Pt had recent cxr performed which is the reason for today's visit. Pt denies any current complaints of cough, SOB, or chest discomfort.     HPI Deanna Olson 88 y.o. -presents with her daughter for follow-up.  She has several great-grandchildren.  She is quite functional lives alone and cooks.  Her main limitation is her back.  This concerned that she might have interstitial lung disease on chest x-ray in the setting of cough and therefore she has been referred here.  History is provided by her review of the chart and the question packet.  Cough is insidious onset in July 2023.  When it started she was using a lot of Kleenex and there is white mucus.  Was really bad but just over time it slowly gotten better.  She is also dealing with significant back pain.  In July 2023 she fell down and bruised her back and over no fractures.  However recently she underwent kyphoplasty February 26, 2022.  She is got a brace and she is got walker.  In the setting of cough primary care physician heard a murmur.  She got referred to Dr. Oran Rein in cardiology and has been reassured.  She also got a chest x-ray which suggested ILD and therefore she has been referred here.  My personal visualization I am not sure she has ILD.  I see prominent bronchovascular markings.  She has severe osteoporosis.   Review of the medication shows she is on lisinopril for several years for hypertension.  She is also got acid reflux which is a risk factor for cough.  She is on Fosamax for many years for osteoporosis which in itself is a significant risk factor for acid reflux.  She does not have a CT scan of the chest.  Nevertheless she did the ILD questionnaire  Symptoms - Insidious onset of cough since May 2023 according to the question [although she told me July 2023] and since onset it is actually better.  She sometimes coughs at night there is white sputum.  Cough is not worse when she lies down.  The cough does affect the quality of her voice.  She does feel a tickle in her throat and she clears her throat.  There is no shortness of breath although she is limited in her mobility.  Past history - There is a remote history of asthma as a child and then as a teenager.  There is no vomiting or diarrhea.  There is mild anxiety but not depression.  She does have chronic pain.  She has acid reflux.  Noticed to be on Fosamax for at least 10 to 12 years.  She does have hypertension.  She does have thyroid disease not otherwise specified.  She does have a systolic murmur.  She has had COVID-vaccine but never had the COVID disease.  She has had kyphoplasty.  Review of  systems positive for fatigue.  Positive for arthralgia.  Has dry eyes.  Has heartburn.  Family history - She reports asthma but I think it is her asthma not family has been.  Rest of the family history is negative for any pulmonary disease  Personal exposure history - Never smoked never did vaping never marijuana never cocaine  Home and Hobby details.  Lives alone.  Does her own cooking.  Detail organic and inorganic antigen exposure history in the house is negative  Pulmonary toxicity history -Denies.  Occupational exposure history negative for organic and inorganic antigens.      Chest XRAY 02/01/22 (ILD mentined since 2018 but not 2-014  cxr) -  Narrative & Impression  CLINICAL DATA:  Persistent cough. Shortness of breath and wheezing for 1 month.   EXAM: CHEST - 2 VIEW   COMPARISON:  Chest x-rays dated 12/31/2021 and 08/09/2016.   FINDINGS: Heart size and mediastinal contours are stable. Coarse lung markings are seen throughout both lungs. No confluent opacity to suggest a developing pneumonia. No pleural effusion or pneumothorax is seen. No acute-appearing osseous abnormality.   IMPRESSION: 1. No active cardiopulmonary disease. No evidence of pneumonia or pulmonary edema. 2. Coarse lung markings throughout both lungs, suspected chronic interstitial lung disease.     Electronically Signed   By: Franki Cabot M.D.   On: 02/03/2022 08:10      PFT      No data to display             has a past medical history of Allergic rhinitis, cause unspecified, Anxiety state, unspecified, Aortic valve disorders, Eczema, Esophageal reflux, Herpes zoster without mention of complication, Hyperlipidemia, Hypertension, Hypothyroid, Osteoarthrosis, unspecified whether generalized or localized, unspecified site, and Unspecified venous (peripheral) insufficiency.   reports that she has never smoked. She has never used smokeless tobacco.  Past Surgical History:  Procedure Laterality Date   CATARACT EXTRACTION, BILATERAL  2013   KYPHOPLASTY  02/2018   VESICOVAGINAL FISTULA CLOSURE W/ TAH      Allergies  Allergen Reactions   Sulfonamide Derivatives Swelling    Angioedema     Immunization History  Administered Date(s) Administered   Fluad Quad(high Dose 65+) 02/08/2019, 03/13/2020   Influenza Split 03/17/2011, 04/06/2012   Influenza Whole 04/12/2005, 03/27/2008, 03/26/2009, 03/14/2010   Influenza, High Dose Seasonal PF 03/18/2016, 03/10/2017, 06/16/2018   Influenza,inj,Quad PF,6+ Mos 03/22/2013, 03/22/2014, 02/24/2015   PFIZER(Purple Top)SARS-COV-2 Vaccination 07/21/2019, 08/15/2019, 04/26/2020   Pneumococcal  Conjugate-13 02/24/2015   Pneumococcal Polysaccharide-23 07/17/2013    Family History  Problem Relation Age of Onset   Stroke Mother 33   Diabetes Mother    Diabetes Brother    Heart attack Brother 28       s/p open heart surg   CAD Brother 39   Cancer Sister        4 types     Current Outpatient Medications:    acetaminophen (TYLENOL) 500 MG tablet, Take 500 mg by mouth daily as needed for moderate pain., Disp: , Rfl:    alendronate (FOSAMAX) 70 MG tablet, Take 1 tablet (70 mg total) by mouth once a week. Take with a full glass of water on an empty stomach. (Patient taking differently: Take 70 mg by mouth every Wednesday.), Disp: 12 tablet, Rfl: 3   ALPRAZolam (XANAX) 0.5 MG tablet, TAKE ONE TABLET BY MOUTH TWICE A DAY, Disp: 60 tablet, Rfl: 2   Apoaequorin (PREVAGEN PO), Take 1 capsule by mouth daily., Disp: , Rfl:  Ascorbic Acid (VITAMIN C PO), Take 1 tablet by mouth daily., Disp: , Rfl:    b complex vitamins capsule, Take 1 capsule by mouth daily., Disp: , Rfl:    betamethasone dipropionate 0.05 % lotion, Apply topically daily as needed. Uses on scalp and ears, Disp: 60 mL, Rfl: 2   Bioflavonoid Products (BIOFLEX) TABS, Take 1 tablet by mouth daily., Disp: , Rfl:    Cholecalciferol (VITAMIN D-3 PO), Take 1 capsule by mouth daily., Disp: , Rfl:    diphenhydramine-acetaminophen (TYLENOL PM) 25-500 MG TABS tablet, Take 1 tablet by mouth at bedtime as needed (pain, sleep)., Disp: , Rfl:    gabapentin (NEURONTIN) 100 MG capsule, Take 1 capsule (100 mg total) by mouth in the morning., Disp: 90 capsule, Rfl: 3   gabapentin (NEURONTIN) 300 MG capsule, TAKE ONE CAPSULE BY MOUTH EVERY NIGHT AT BEDTIME, Disp: 90 capsule, Rfl: 1   guaiFENesin-dextromethorphan (ROBITUSSIN DM) 100-10 MG/5ML syrup, Take 5 mLs by mouth every 4 (four) hours as needed for cough., Disp: 118 mL, Rfl: 0   hydrocortisone 2.5 % ointment, Apply topically., Disp: , Rfl:    ketoconazole (NIZORAL) 2 % shampoo, USE TO  SHAMPOO TWICE WEEKLY (Patient taking differently: Apply 1 Application topically every Friday.), Disp: 100 mL, Rfl: 0   levothyroxine (SYNTHROID) 50 MCG tablet, Take 1 tablet (50 mcg total) by mouth daily before breakfast., Disp: 90 tablet, Rfl: 0   Lidocaine 4 % PTCH, Apply 1 patch topically daily., Disp: , Rfl:    lisinopril (ZESTRIL) 20 MG tablet, TAKE ONE TABLET BY MOUTH DAILY, Disp: 90 tablet, Rfl: 3   mirabegron ER (MYRBETRIQ) 50 MG TB24 tablet, Take 1 tablet (50 mg total) by mouth daily., Disp: 90 tablet, Rfl: 3   Multiple Vitamins-Minerals (PRESERVISION AREDS 2) CAPS, Take 1 capsule by mouth daily., Disp: , Rfl:    omeprazole (PRILOSEC) 20 MG capsule, TAKE ONE CAPSULE BY MOUTH DAILY ON AN EMPTY STOMACH (Patient taking differently: Take 20 mg by mouth daily.), Disp: 90 capsule, Rfl: 1   Polyethyl Glycol-Propyl Glycol 0.4-0.3 % SOLN, Apply 1 drop to eye 2 (two) times daily as needed (dry eyes)., Disp: , Rfl:    POTASSIUM PO, Take 1 tablet by mouth daily., Disp: , Rfl:    pravastatin (PRAVACHOL) 20 MG tablet, TAKE ONE TABLET BY MOUTH DAILY, Disp: 90 tablet, Rfl: 3   VITAMIN E PO, Take 1 capsule by mouth daily., Disp: , Rfl:       Objective:   Vitals:   03/09/22 1132  BP: 122/68  Pulse: 64  Temp: 97.6 F (36.4 C)  TempSrc: Oral  SpO2: 97%  Weight: 146 lb 5.8 oz (66.4 kg)  Height: '5\' 4"'$  (1.626 m)    Estimated body mass index is 25.12 kg/m as calculated from the following:   Height as of this encounter: '5\' 4"'$  (1.626 m).   Weight as of this encounter: 146 lb 5.8 oz (66.4 kg).  '@WEIGHTCHANGE'$ @  [employer]   03/09/22 1132  Weight: 146 lb 5.8 oz (66.4 kg)     Physical Exam    General: No distress.  Pleasant looks well.  Cognitively sharp.  Answers questions really well.  Mildly deconditioned looking. Neuro: Alert and Oriented x 3. GCS 15. Speech normal Psych: Pleasant Resp:  Barrel Chest - no.  Wheeze - no, Crackles - no, No overt respiratory distress CVS: Normal heart  sounds. Murmurs - no Ext: Stigmata of Connective Tissue Disease - no HEENT: Normal upper airway. PEERL +. No post  nasal drip        Assessment:       ICD-10-CM   1. Chronic cough  R05.3 CT Chest High Resolution    CANCELED: CT Chest High Resolution    2. History of gastroesophageal reflux (GERD)  Z87.19     3. Abnormal chest x-ray  R93.89 CT Chest High Resolution    CANCELED: CT Chest High Resolution         Plan:     Patient Instructions     ICD-10-CM   1. Chronic cough  R05.3     2. History of gastroesophageal reflux (GERD)  Z87.19     3. Abnormal chest x-ray  R93.89       Chronic rcent cough Is not fully explained but I see lisinopril and fosafamax that makes acid reflux worse as 2 risk factors  Not sure you have scarring in your lungs based on exam and chest xray  Plan  - talk to PCP Hoyt Koch, MD and see if you can get your fosfamax and lisinopril changed to something else starting wth lisinopril first  - get HRCT supine and prone sometime next several weeks to 2 months  Followup  - DR Chase Caller in 2 months or so but after completing above changes    SIGNATURE    Dr. Brand Males, M.D., F.C.C.P,  Pulmonary and Critical Care Medicine Staff Physician, [place] Director - Interstitial Lung Disease  Program  Pulmonary [place] at [place], [place], [address]  Pager: [phone], If no answer or between  15:00h - 7:00h: call [phone] Telephone: [phone]  6:19 PM 03/09/2022

## 2022-03-10 ENCOUNTER — Other Ambulatory Visit: Payer: Self-pay | Admitting: Internal Medicine

## 2022-03-15 ENCOUNTER — Telehealth: Payer: Self-pay | Admitting: Internal Medicine

## 2022-03-15 NOTE — Telephone Encounter (Signed)
Patient called back and states that patient is completely out of medication.

## 2022-03-23 ENCOUNTER — Telehealth: Payer: PPO

## 2022-03-26 DIAGNOSIS — M4726 Other spondylosis with radiculopathy, lumbar region: Secondary | ICD-10-CM | POA: Diagnosis not present

## 2022-03-26 DIAGNOSIS — M8008XA Age-related osteoporosis with current pathological fracture, vertebra(e), initial encounter for fracture: Secondary | ICD-10-CM | POA: Diagnosis not present

## 2022-03-30 NOTE — Telephone Encounter (Signed)
Patients daughter is asking for this to be called in as patient is almost out and Kristopher Oppenheim does not have the shampoo.

## 2022-03-31 ENCOUNTER — Other Ambulatory Visit: Payer: Self-pay | Admitting: *Deleted

## 2022-03-31 MED ORDER — KETOCONAZOLE 2 % EX SHAM: 1.0000 | MEDICATED_SHAMPOO | CUTANEOUS | 0 refills | Status: DC

## 2022-03-31 NOTE — Telephone Encounter (Signed)
sent 

## 2022-04-17 ENCOUNTER — Other Ambulatory Visit: Payer: Self-pay | Admitting: Internal Medicine

## 2022-04-22 DIAGNOSIS — M8008XA Age-related osteoporosis with current pathological fracture, vertebra(e), initial encounter for fracture: Secondary | ICD-10-CM | POA: Diagnosis not present

## 2022-04-28 ENCOUNTER — Ambulatory Visit
Admission: RE | Admit: 2022-04-28 | Discharge: 2022-04-28 | Disposition: A | Discharge status: Home / Self Care | Payer: PPO | Source: Ambulatory Visit | Attending: Internal Medicine | Admitting: Internal Medicine

## 2022-04-28 DIAGNOSIS — R011 Cardiac murmur, unspecified: Secondary | ICD-10-CM | POA: Diagnosis not present

## 2022-04-28 DIAGNOSIS — R9389 Abnormal findings on diagnostic imaging of other specified body structures: Secondary | ICD-10-CM

## 2022-04-28 DIAGNOSIS — J479 Bronchiectasis, uncomplicated: Secondary | ICD-10-CM | POA: Diagnosis not present

## 2022-04-28 DIAGNOSIS — R053 Chronic cough: Secondary | ICD-10-CM

## 2022-04-28 DIAGNOSIS — J84112 Idiopathic pulmonary fibrosis: Secondary | ICD-10-CM | POA: Diagnosis not present

## 2022-04-28 DIAGNOSIS — I700 Atherosclerosis of aorta: Secondary | ICD-10-CM | POA: Diagnosis not present

## 2022-05-03 NOTE — Progress Notes (Signed)
Possible ILD. Very small nodules. Will see her end of month

## 2022-05-13 ENCOUNTER — Ambulatory Visit (INDEPENDENT_AMBULATORY_CARE_PROVIDER_SITE_OTHER): Payer: PPO | Admitting: Internal Medicine

## 2022-05-13 ENCOUNTER — Encounter: Payer: Self-pay | Admitting: Internal Medicine

## 2022-05-13 VITALS — BP 128/68 | HR 66 | Temp 97.8°F | Ht 64.0 in | Wt 152.0 lb

## 2022-05-13 DIAGNOSIS — R053 Chronic cough: Secondary | ICD-10-CM | POA: Diagnosis not present

## 2022-05-13 DIAGNOSIS — Z8719 Personal history of other diseases of the digestive system: Secondary | ICD-10-CM | POA: Diagnosis not present

## 2022-05-13 NOTE — Progress Notes (Signed)
OV 03/09/2022  Subjective:  Patient ID: Deanna Olson, female , DOB: 27-Jul-1925 , age 88 y.o. , MRN: [id number] , ADDRESS: [place] [id number] PCP Hoyt Koch, MD Patient Care Team: Hoyt Koch, MD as PCP - General (Internal Medicine) Minus Breeding, MD as PCP - Cardiology (Cardiology) Druscilla Brownie, MD (Dermatology) Shon Hough, MD (Ophthalmology) Delice Bison Darnelle Maffucci, [facility] (Inactive) as Pharmacist (Pharmacist)  This Provider for this visit: Treatment Team:  Attending Provider: Brand Males, MD    03/09/2022 -   Chief Complaint  Patient presents with   Consult    Pt had recent cxr performed which is the reason for today's visit. Pt denies any current complaints of cough, SOB, or chest discomfort.     HPI Deanna Olson 88 y.o. -presents with her daughter for follow-up.  She has several great-grandchildren.  She is quite functional lives alone and cooks.  Her main limitation is her back.  This concerned that she might have interstitial lung disease on chest x-ray in the setting of cough and therefore she has been referred here.  History is provided by her review of the chart and the question packet.  Cough is insidious onset in July 2023.  When it started she was using a lot of Kleenex and there is white mucus.  Was really bad but just over time it slowly gotten better.  She is also dealing with significant back pain.  In July 2023 she fell down and bruised her back and over no fractures.  However recently she underwent kyphoplasty February 26, 2022.  She is got a brace and she is got walker.  In the setting of cough primary care physician heard a murmur.  She got referred to Dr. Oran Rein in cardiology and has been reassured.  She also got a chest x-ray which suggested ILD and therefore she has been referred here.  My personal visualization I am not sure she has ILD.  I see prominent bronchovascular markings.  She has severe osteoporosis.   Review of the medication shows she is on lisinopril for several years for hypertension.  She is also got acid reflux which is a risk factor for cough.  She is on Fosamax for many years for osteoporosis which in itself is a significant risk factor for acid reflux.  She does not have a CT scan of the chest.  Nevertheless she did the ILD questionnaire  Symptoms - Insidious onset of cough since May 2023 according to the question [although she told me July 2023] and since onset it is actually better.  She sometimes coughs at night there is white sputum.  Cough is not worse when she lies down.  The cough does affect the quality of her voice.  She does feel a tickle in her throat and she clears her throat.  There is no shortness of breath although she is limited in her mobility.  Past history - There is a remote history of asthma as a child and then as a teenager.  There is no vomiting or diarrhea.  There is mild anxiety but not depression.  She does have chronic pain.  She has acid reflux.  Noticed to be on Fosamax for at least 10 to 12 years.  She does have hypertension.  She does have thyroid disease not otherwise specified.  She does have a systolic murmur.  She has had COVID-vaccine but never had the COVID disease.  She has had kyphoplasty.  Review of  systems positive for fatigue.  Positive for arthralgia.  Has dry eyes.  Has heartburn.  Family history - She reports asthma but I think it is her asthma not family has been.  Rest of the family history is negative for any pulmonary disease  Personal exposure history - Never smoked never did vaping never marijuana never cocaine  Home and Hobby details.  Lives alone.  Does her own cooking.  Detail organic and inorganic antigen exposure history in the house is negative  Pulmonary toxicity history -Denies.  Occupational exposure history negative for organic and inorganic antigens.      Chest XRAY 02/01/22 (ILD mentined since 2018 but not 2-014  cxr) -  Narrative & Impression  CLINICAL DATA:  Persistent cough. Shortness of breath and wheezing for 1 month.   EXAM: CHEST - 2 VIEW   COMPARISON:  Chest x-rays dated 12/31/2021 and 08/09/2016.   FINDINGS: Heart size and mediastinal contours are stable. Coarse lung markings are seen throughout both lungs. No confluent opacity to suggest a developing pneumonia. No pleural effusion or pneumothorax is seen. No acute-appearing osseous abnormality.   IMPRESSION: 1. No active cardiopulmonary disease. No evidence of pneumonia or pulmonary edema. 2. Coarse lung markings throughout both lungs, suspected chronic interstitial lung disease.     Electronically Signed   By: Franki Cabot M.D.   On: 02/03/2022 08:10    OV 05/13/2022  Subjective:  Patient ID: Deanna Olson, female , DOB: 05-Jul-1925 , age 21 y.o. , MRN: [id number] , ADDRESS: [place] [id number] PCP Hoyt Koch, MD Patient Care Team: Hoyt Koch, MD as PCP - General (Internal Medicine) Minus Breeding, MD as PCP - Cardiology (Cardiology) Druscilla Brownie, MD (Dermatology) Shon Hough, MD (Ophthalmology) Delice Bison Darnelle Maffucci, [facility] (Inactive) as Pharmacist (Pharmacist)  This Provider for this visit: Treatment Team:  Attending Provider: Brand Males, MD    05/13/2022 -   Chief Complaint  Patient presents with   Follow-up    Pt is here following up after recent CT.  Pt states she has been doing good since last visit and denies any complaints.     HPI Deanna Olson 88 y.o. -returns for follow-up.  At this time she presents with her daughter.  They both report the cough is nearly resolved.  She has not stopped lisinopril or Fosamax.  But the cough is resolved.  She had high-resolution CT chest I personally visualized.  It is essentially normal.  There are some bland scarring.  She reports remote trauma to her chest particularly on the right side.  The scarring is mostly on  the right base.  It is only very mild.  Overall very reassuring findings.  She has not had a flu shot this year.  She not had RSV vaccine.  I did counsel her strongly to get these vaccines.  She probably has had a COVID-vaccine but if she is not she is going to get it.    CT Chest data  - HRCT 04/28/22  rrative & Impression  CLINICAL DATA:  Chronic cough for 3 months with intermittent dyspnea. History of heart murmur.   EXAM: CT CHEST WITHOUT CONTRAST   TECHNIQUE: Multidetector CT imaging of the chest was performed following the standard protocol without intravenous contrast. High resolution imaging of the lungs, as well as inspiratory and expiratory imaging, was performed.   RADIATION DOSE REDUCTION: This exam was performed according to the departmental dose-optimization program which includes automated exposure control, adjustment of the mA  and/or kV according to patient size and/or use of iterative reconstruction technique.   COMPARISON:  02/01/2022 chest radiograph.   FINDINGS: Cardiovascular: Normal heart size. No significant pericardial effusion/thickening. Three-vessel coronary atherosclerosis. Atherosclerotic nonaneurysmal thoracic aorta. Normal caliber pulmonary arteries.   Mediastinum/Nodes: No significant thyroid nodules. Unremarkable esophagus. No pathologically enlarged axillary, mediastinal or hilar lymph nodes, noting limited sensitivity for the detection of hilar adenopathy on this noncontrast study.   Lungs/Pleura: No pneumothorax. No pleural effusion. Several small solid pulmonary nodules scattered in both lungs, largest 0.5 cm in the anteromedial right mid lung (series 8/image 79). No significant lobular air trapping or evidence of tracheobronchomalacia on the expiration sequence. Mild patchy subpleural reticulation and ground-glass opacity with scattered subpleural lines throughout the lungs with associated minimal cylindrical bronchiolectasis. There  is a basilar predominance to these findings. No frank honeycombing.   Upper abdomen: Simple 6.6 cm upper left renal cyst, for which no follow-up imaging is recommended.   Musculoskeletal: No aggressive appearing focal osseous lesions. Mild thoracic spondylosis.   IMPRESSION: 1. Spectrum of findings compatible with mild basilar predominant fibrotic interstitial lung disease without frank honeycombing. Bland mild postinfectious/postinflammatory scarring is favored. Early UIP is not excluded but less favored. Findings are indeterminate for UIP per consensus guidelines: Diagnosis of Idiopathic Pulmonary Fibrosis: An Official ATS/ERS/JRS/ALAT Clinical Practice Guideline. [place], Iss 5, [phone], Feb 12 2017. 2. Three-vessel coronary atherosclerosis. 3. Several small solid pulmonary nodules scattered in both lungs, largest 0.5 cm. No follow-up needed if patient is low-risk (and has no known or suspected primary neoplasm). Non-contrast chest CT can be considered in 12 months if patient is high-risk. This recommendation follows the consensus statement: Guidelines for Management of Incidental Pulmonary Nodules Detected on CT Images: From the Fleischner Society 2017; Radiology 2017; 284:228-243. 4.  Aortic Atherosclerosis (ICD10-I70.0).     Electronically Signed   By: Ilona Sorrel M.D.   On: 04/29/2022 12:36      No results found.    PFT      No data to display             has a past medical history of Allergic rhinitis, cause unspecified, Anxiety state, unspecified, Aortic valve disorders, Eczema, Esophageal reflux, Herpes zoster without mention of complication, Hyperlipidemia, Hypertension, Hypothyroid, Osteoarthrosis, unspecified whether generalized or localized, unspecified site, and Unspecified venous (peripheral) insufficiency.   reports that she has never smoked. She has never used smokeless tobacco.  Past Surgical History:  Procedure  Laterality Date   CATARACT EXTRACTION, BILATERAL  2013   KYPHOPLASTY  02/2018   VESICOVAGINAL FISTULA CLOSURE W/ TAH      Allergies  Allergen Reactions   Sulfonamide Derivatives Swelling    Angioedema     Immunization History  Administered Date(s) Administered   Fluad Quad(high Dose 65+) 02/08/2019, 03/13/2020   Influenza Split 03/17/2011, 04/06/2012   Influenza Whole 04/12/2005, 03/27/2008, 03/26/2009, 03/14/2010   Influenza, High Dose Seasonal PF 03/18/2016, 03/10/2017, 06/16/2018   Influenza,inj,Quad PF,6+ Mos 03/22/2013, 03/22/2014, 02/24/2015   PFIZER(Purple Top)SARS-COV-2 Vaccination 07/21/2019, 08/15/2019, 04/26/2020   Pneumococcal Conjugate-13 02/24/2015   Pneumococcal Polysaccharide-23 07/17/2013    Family History  Problem Relation Age of Onset   Stroke Mother 100   Diabetes Mother    Diabetes Brother    Heart attack Brother 50       s/p open heart surg   CAD Brother 55   Cancer Sister        4 types  Current Outpatient Medications:    acetaminophen (TYLENOL) 500 MG tablet, Take 500 mg by mouth daily as needed for moderate pain., Disp: , Rfl:    alendronate (FOSAMAX) 70 MG tablet, Take 1 tablet (70 mg total) by mouth once a week. Take with a full glass of water on an empty stomach. (Patient taking differently: Take 70 mg by mouth every Wednesday.), Disp: 12 tablet, Rfl: 3   ALPRAZolam (XANAX) 0.5 MG tablet, TAKE ONE TABLET BY MOUTH TWICE A DAY, Disp: 60 tablet, Rfl: 2   Apoaequorin (PREVAGEN PO), Take 1 capsule by mouth daily., Disp: , Rfl:    Ascorbic Acid (VITAMIN C PO), Take 1 tablet by mouth daily., Disp: , Rfl:    b complex vitamins capsule, Take 1 capsule by mouth daily., Disp: , Rfl:    betamethasone dipropionate 0.05 % lotion, Apply topically daily as needed. Uses on scalp and ears, Disp: 60 mL, Rfl: 2   Bioflavonoid Products (BIOFLEX) TABS, Take 1 tablet by mouth daily., Disp: , Rfl:    Cholecalciferol (VITAMIN D-3 PO), Take 1 capsule by mouth daily.,  Disp: , Rfl:    diphenhydramine-acetaminophen (TYLENOL PM) 25-500 MG TABS tablet, Take 1 tablet by mouth at bedtime as needed (pain, sleep)., Disp: , Rfl:    gabapentin (NEURONTIN) 100 MG capsule, Take 1 capsule (100 mg total) by mouth in the morning., Disp: 90 capsule, Rfl: 3   gabapentin (NEURONTIN) 300 MG capsule, TAKE ONE CAPSULE BY MOUTH EVERY NIGHT AT BEDTIME, Disp: 90 capsule, Rfl: 1   guaiFENesin-dextromethorphan (ROBITUSSIN DM) 100-10 MG/5ML syrup, Take 5 mLs by mouth every 4 (four) hours as needed for cough., Disp: 118 mL, Rfl: 0   hydrocortisone 2.5 % ointment, Apply topically., Disp: , Rfl:    ketoconazole (NIZORAL) 2 % shampoo, Apply 1 Application topically every Friday., Disp: 100 mL, Rfl: 0   levothyroxine (SYNTHROID) 50 MCG tablet, TAKE 1 TABLET BY MOUTH DAILY BEFORE BREAKFAST, Disp: 90 tablet, Rfl: 0   Lidocaine 4 % PTCH, Apply 1 patch topically daily., Disp: , Rfl:    lisinopril (ZESTRIL) 20 MG tablet, TAKE ONE TABLET BY MOUTH DAILY, Disp: 90 tablet, Rfl: 3   mirabegron ER (MYRBETRIQ) 50 MG TB24 tablet, Take 1 tablet (50 mg total) by mouth daily., Disp: 90 tablet, Rfl: 3   Multiple Vitamins-Minerals (PRESERVISION AREDS 2) CAPS, Take 1 capsule by mouth daily., Disp: , Rfl:    omeprazole (PRILOSEC) 20 MG capsule, TAKE ONE CAPSULE BY MOUTH DAILY ON AN EMPTY STOMACH, Disp: 90 capsule, Rfl: 1   Polyethyl Glycol-Propyl Glycol 0.4-0.3 % SOLN, Apply 1 drop to eye 2 (two) times daily as needed (dry eyes)., Disp: , Rfl:    POTASSIUM PO, Take 1 tablet by mouth daily., Disp: , Rfl:    pravastatin (PRAVACHOL) 20 MG tablet, TAKE ONE TABLET BY MOUTH DAILY, Disp: 90 tablet, Rfl: 3   VITAMIN E PO, Take 1 capsule by mouth daily., Disp: , Rfl:       Objective:   Vitals:   05/13/22 1453  BP: 128/68  Pulse: 66  Temp: 97.8 F (36.6 C)  TempSrc: Oral  SpO2: 98%  Weight: 152 lb (68.9 kg)  Height: '5\' 4"'$  (1.626 m)    Estimated body mass index is 26.09 kg/m as calculated from the  following:   Height as of this encounter: '5\' 4"'$  (1.626 m).   Weight as of this encounter: 152 lb (68.9 kg).  '@WEIGHTCHANGE'$ @  Filed Weights   05/13/22 1453  Weight: 152 lb (68.9  kg)     Physical Exam    General: No distress. Looksw well Neuro: Alert and Oriented x 3. GCS 15. Speech normal Psych: Pleasant Resp:  Barrel Chest - no.  Wheeze - no, Crackles - no, No overt respiratory distress CVS: Normal heart sounds. Murmurs - no Ext: Stigmata of Connective Tissue Disease - no MSK p has thoracic brace HEENT: Normal upper airway. PEERL +. No post nasal drip        Assessment:       ICD-10-CM   1. Chronic cough  R05.3     2. History of gastroesophageal reflux (GERD)  Z87.19          Plan:     Patient Instructions     ICD-10-CM   1. Chronic cough  R05.3     2. History of gastroesophageal reflux (GERD)  Z87.19     3. Abnormal chest x-ray  R93.89      Gald cough is nearly resolved No ILD on CT - only some bland scarring from prior trauma or infection - this is not cause of cough and this should not bother you  Plan - reassure  - talk to PCP Hoyt Koch, MD and if cough comes back - stop lisinopril +/- fosfamax -   Followup  - expectant folowup    SIGNATURE    Dr. Brand Males, M.D., F.C.C.P,  Pulmonary and Critical Care Medicine Staff Physician, [place] Director - Interstitial Lung Disease  Program  Pulmonary [place] [facility] [place], [place], [address]  Pager: [phone], If no answer or between  15:00h - 7:00h: call [phone] Telephone: [phone]  3:31 PM 05/13/2022

## 2022-05-13 NOTE — Patient Instructions (Addendum)
ICD-10-CM   1. Chronic cough  R05.3     2. History of gastroesophageal reflux (GERD)  Z87.19     3. Abnormal chest x-ray  R93.89      Deanna Olson cough is nearly resolved No ILD on CT - only some bland scarring from prior trauma or infection - this is not cause of cough and this should not bother you  Plan - reassure  - talk to PCP Hoyt Koch, MD and if cough comes back - stop lisinopril +/- fosfamax -   Followup  - expectant folowup

## 2022-06-19 ENCOUNTER — Other Ambulatory Visit: Payer: Self-pay | Admitting: Internal Medicine

## 2022-07-08 ENCOUNTER — Other Ambulatory Visit: Payer: Self-pay | Admitting: Internal Medicine

## 2022-07-17 ENCOUNTER — Other Ambulatory Visit: Payer: Self-pay | Admitting: Internal Medicine

## 2022-07-30 DIAGNOSIS — M8008XA Age-related osteoporosis with current pathological fracture, vertebra(e), initial encounter for fracture: Secondary | ICD-10-CM | POA: Diagnosis not present

## 2022-07-30 DIAGNOSIS — M4726 Other spondylosis with radiculopathy, lumbar region: Secondary | ICD-10-CM | POA: Diagnosis not present

## 2022-08-23 ENCOUNTER — Ambulatory Visit (INDEPENDENT_AMBULATORY_CARE_PROVIDER_SITE_OTHER): Payer: PPO | Admitting: Internal Medicine

## 2022-08-23 ENCOUNTER — Encounter: Payer: Self-pay | Admitting: Internal Medicine

## 2022-08-23 VITALS — BP 122/80 | HR 60 | Temp 97.8°F | Ht 64.0 in | Wt 151.0 lb

## 2022-08-23 DIAGNOSIS — R052 Subacute cough: Secondary | ICD-10-CM | POA: Diagnosis not present

## 2022-08-23 DIAGNOSIS — I10 Essential (primary) hypertension: Secondary | ICD-10-CM

## 2022-08-23 MED ORDER — LOSARTAN POTASSIUM 50 MG PO TABS: 50.0000 mg | ORAL_TABLET | Freq: Every day | ORAL | 3 refills | Status: DC

## 2022-08-23 MED ORDER — PREDNISONE 20 MG PO TABS: 40.0000 mg | ORAL_TABLET | Freq: Every day | ORAL | 0 refills | Status: DC

## 2022-08-23 NOTE — Progress Notes (Signed)
   Subjective:   Patient ID: Deanna Olson, female    DOB: 02-Aug-1925, 89 y.o.   MRN: [id number]  HPI The patient is a 89 YO female coming in for follow up. New cough in the last few weeks which is worse than usual. She does have chronic cough.  Review of Systems  Constitutional: Negative.   HENT: Negative.    Eyes: Negative.   Respiratory:  Positive for cough. Negative for chest tightness and shortness of breath.   Cardiovascular:  Negative for chest pain, palpitations and leg swelling.  Gastrointestinal:  Negative for abdominal distention, abdominal pain, constipation, diarrhea, nausea and vomiting.  Musculoskeletal: Negative.   Skin: Negative.   Neurological: Negative.   Psychiatric/Behavioral: Negative.      Objective:  Physical Exam Constitutional:      Appearance: She is well-developed.  HENT:     Head: Normocephalic and atraumatic.  Cardiovascular:     Rate and Rhythm: Normal rate and regular rhythm.  Pulmonary:     Effort: Pulmonary effort is normal. No respiratory distress.     Breath sounds: Rhonchi present. No wheezing or rales.  Abdominal:     General: Bowel sounds are normal. There is no distension.     Palpations: Abdomen is soft.     Tenderness: There is no abdominal tenderness. There is no rebound.  Musculoskeletal:     Cervical back: Normal range of motion.  Skin:    General: Skin is warm and dry.  Neurological:     Mental Status: She is alert and oriented to person, place, and time.     Coordination: Coordination normal.     Vitals:   08/23/22 1100  BP: 122/80  Pulse: 60  Temp: 97.8 F (36.6 C)  TempSrc: Oral  SpO2: 100%  Weight: 151 lb (68.5 kg)  Height: 5\' 4"  (1.626 m)    Assessment & Plan:

## 2022-08-23 NOTE — Patient Instructions (Addendum)
We will stop fosamax.  We will stop lisinopril and start losartan instead to help reduce the chronic cough.  We have sent in the prednisone to take 2 pills daily for 5 days.

## 2022-08-27 NOTE — Assessment & Plan Note (Signed)
Acute on chronic cough and rx prednisone. No sounds consistent with pneumonia on exam. We are stopping lisinopril which we have talked several times can cause chronic cough and start losartan 50 mg daily instead.

## 2022-08-27 NOTE — Assessment & Plan Note (Signed)
We have stopped lisinopril 20 mg daily due to cough and started losartan 50 mg daily which should be equivalent for BP control. Monitor BP at home closely.

## 2022-09-02 ENCOUNTER — Other Ambulatory Visit: Payer: Self-pay | Admitting: Internal Medicine

## 2022-11-05 ENCOUNTER — Encounter: Payer: Self-pay | Admitting: Internal Medicine

## 2022-11-05 ENCOUNTER — Ambulatory Visit (INDEPENDENT_AMBULATORY_CARE_PROVIDER_SITE_OTHER): Payer: PPO | Admitting: Internal Medicine

## 2022-11-05 VITALS — BP 140/80 | HR 64 | Temp 97.9°F | Ht 64.0 in | Wt 152.0 lb

## 2022-11-05 DIAGNOSIS — R052 Subacute cough: Secondary | ICD-10-CM

## 2022-11-05 DIAGNOSIS — I10 Essential (primary) hypertension: Secondary | ICD-10-CM

## 2022-11-05 MED ORDER — AZITHROMYCIN 250 MG PO TABS: ORAL_TABLET | ORAL | 0 refills | Status: AC

## 2022-11-05 MED ORDER — PREDNISONE 20 MG PO TABS: 40.0000 mg | ORAL_TABLET | Freq: Every day | ORAL | 0 refills | Status: DC

## 2022-11-05 NOTE — Assessment & Plan Note (Signed)
Overall chronic cough is improved off ACE-I. She has been off about 2 months now. New cough in last month with some reports of wheezing. Some mild ILD versus lung scarring on high resolution CT chest. Suspect flare of that and treating with azithromycin and prednisone. She will let us know if not improving.

## 2022-11-05 NOTE — Patient Instructions (Signed)
We have sent in the azithromycin to take 2 pills today, then 1 pill daily starting tomorrow.   We have sent in prednisone to take 2 pills daily for 5 days.

## 2022-11-05 NOTE — Progress Notes (Signed)
   Subjective:   Patient ID: Deanna Olson, female    DOB: 1926/02/12, 89 y.o.   MRN: [id number]  HPI The patient is a 89 YO female coming in for follow up chronic cough. We stopped ACE-I about 2 months ago and started ARB. Change in cough is improved but last 3-4 weeks more cough and wheezing.   Review of Systems  Constitutional: Negative.   HENT: Negative.    Eyes: Negative.   Respiratory:  Positive for cough and shortness of breath. Negative for chest tightness.   Cardiovascular:  Negative for chest pain, palpitations and leg swelling.  Gastrointestinal:  Negative for abdominal distention, abdominal pain, constipation, diarrhea, nausea and vomiting.  Musculoskeletal:  Positive for arthralgias and gait problem.  Skin: Negative.   Psychiatric/Behavioral: Negative.      Objective:  Physical Exam Constitutional:      Appearance: She is well-developed.  HENT:     Head: Normocephalic and atraumatic.  Cardiovascular:     Rate and Rhythm: Normal rate and regular rhythm.  Pulmonary:     Effort: Pulmonary effort is normal. No respiratory distress.     Breath sounds: Rhonchi present. No wheezing or rales.  Abdominal:     General: Bowel sounds are normal. There is no distension.     Palpations: Abdomen is soft.     Tenderness: There is no abdominal tenderness. There is no rebound.  Musculoskeletal:     Cervical back: Normal range of motion.  Skin:    General: Skin is warm and dry.  Neurological:     Mental Status: She is alert and oriented to person, place, and time.     Coordination: Coordination abnormal.     Comments: Cane and slow gait     Vitals:   11/05/22 1349 11/05/22 1351  BP: (!) 140/80 (!) 140/80  Pulse: 64   Temp: 97.9 F (36.6 C)   TempSrc: Oral   SpO2: 95%   Weight: 152 lb (68.9 kg)   Height: 5\' 4"  (1.626 m)     Assessment & Plan:

## 2022-11-05 NOTE — Assessment & Plan Note (Signed)
BP at goal on losartan 50 mg daily will continue.

## 2022-12-14 ENCOUNTER — Other Ambulatory Visit: Payer: Self-pay | Admitting: Internal Medicine

## 2022-12-22 ENCOUNTER — Other Ambulatory Visit: Payer: Self-pay | Admitting: Internal Medicine

## 2022-12-24 ENCOUNTER — Ambulatory Visit (INDEPENDENT_AMBULATORY_CARE_PROVIDER_SITE_OTHER): Payer: PPO

## 2022-12-24 VITALS — BP 130/70 | Ht 59.5 in | Wt 154.4 lb

## 2022-12-24 DIAGNOSIS — Z0000 Encounter for general adult medical examination without abnormal findings: Secondary | ICD-10-CM

## 2022-12-24 NOTE — Patient Instructions (Signed)
Deanna Olson , Thank you for taking time to come for your Medicare Wellness Visit. I appreciate your ongoing commitment to your health goals. Please review the following plan we discussed and let me know if I can assist you in the future.   These are the goals we discussed:  Goals      Patient Stated     Continue to be physically and socially active, enjoy life and family.      Prevent Falls and Broken Bones-Osteoporosis     Timeframe:  Long-Range Goal Priority:  High Start Date:   12/08/2020                          Expected End Date:   06/09/2022                   Follow Up Date 03/2022   - always use handrails on the stairs - always wear shoes or slippers with non-slip sole - get at least 10 minutes of activity every day - keep cell phone with me always - make an emergency alert plan in case I fall - use a cane or walker    Why is this important?   When you fall, there are 3 things that control if a bone breaks or not.  These are the fall itself, how hard and the direction that you fall and how fragile your bones are.  Preventing falls is very important for you because of fragile bones.         Track and Manage My Blood Pressure-Hypertension     Timeframe:  Long-Range Goal Priority:  High Start Date:   12/08/2020                          Expected End Date:   06/09/2022                   Follow Up Date 09/21/2021   - check blood pressure weekly - choose a place to take my blood pressure (home, clinic or office, retail store) - write blood pressure results in a log or diary    Why is this important?   You won't feel high blood pressure, but it can still hurt your blood vessels.  High blood pressure can cause heart or kidney problems. It can also cause a stroke.  Making lifestyle changes like losing a little weight or eating less salt will help.  Checking your blood pressure at home and at different times of the day can help to control blood pressure.  If the doctor  prescribes medicine remember to take it the way the doctor ordered.  Call the office if you cannot afford the medicine or if there are questions about it.           This is a list of the screening recommended for you and due dates:  Health Maintenance  Topic Date Due   DTaP/Tdap/Td vaccine (1 - Tdap) Never done   COVID-19 Vaccine (4 - 2023-24 season) 02/12/2022   Flu Shot  01/13/2023   Medicare Annual Wellness Visit  12/24/2023   Pneumonia Vaccine  Completed   DEXA scan (bone density measurement)  Completed   HPV Vaccine  Aged Out   Zoster (Shingles) Vaccine  Discontinued    Advanced directives: Please bring a copy of your health care power of attorney and living will to the office to be added to  your chart at your convenience.   Conditions/risks identified:  Each day, aim for 6 glasses of water, plenty of protein in your diet and try to get up and walk/ stretch every hour for 5-10 minutes at a time.  Remember to elevate your feet more and keep up the good work.  Next appointment: Follow up in one year for your annual wellness visit.   Preventive Care 20 Years and Older, Female  Preventive care refers to lifestyle choices and visits with your health care provider that can promote health and wellness. What does preventive care include? A yearly physical exam. This is also called an annual well check. Dental exams once or twice a year. Routine eye exams. Ask your health care provider how often you should have your eyes checked. Personal lifestyle choices, including: Daily care of your teeth and gums. Regular physical activity. Eating a healthy diet. Avoiding tobacco and drug use. Limiting alcohol use. Practicing safe sex. Taking low doses of aspirin every day. Taking vitamin and mineral supplements as recommended by your health care provider. What happens during an annual well check? The services and screenings done by your health care provider during your annual well check  will depend on your age, overall health, lifestyle risk factors, and family history of disease. Counseling  Your health care provider may ask you questions about your: Alcohol use. Tobacco use. Drug use. Emotional well-being. Home and relationship well-being. Sexual activity. Eating habits. History of falls. Memory and ability to understand (cognition). Work and work Astronomer. Screening  You may have the following tests or measurements: Height, weight, and BMI. Blood pressure. Lipid and cholesterol levels. These may be checked every 5 years, or more frequently if you are over 57 years old. Skin check. Lung cancer screening. You may have this screening every year starting at age 38 if you have a 30-pack-year history of smoking and currently smoke or have quit within the past 15 years. Fecal occult blood test (FOBT) of the stool. You may have this test every year starting at age 57. Flexible sigmoidoscopy or colonoscopy. You may have a sigmoidoscopy every 5 years or a colonoscopy every 10 years starting at age 55. Prostate cancer screening. Recommendations will vary depending on your family history and other risks. Hepatitis C blood test. Hepatitis B blood test. Sexually transmitted disease (STD) testing. Diabetes screening. This is done by checking your blood sugar (glucose) after you have not eaten for a while (fasting). You may have this done every 1-3 years. Abdominal aortic aneurysm (AAA) screening. You may need this if you are a current or former smoker. Osteoporosis. You may be screened starting at age 63 if you are at high risk. Talk with your health care provider about your test results, treatment options, and if necessary, the need for more tests. Vaccines  Your health care provider may recommend certain vaccines, such as: Influenza vaccine. This is recommended every year. Tetanus, diphtheria, and acellular pertussis (Tdap, Td) vaccine. You may need a Td booster every 10  years. Zoster vaccine. You may need this after age 57. Pneumococcal 13-valent conjugate (PCV13) vaccine. One dose is recommended after age 42. Pneumococcal polysaccharide (PPSV23) vaccine. One dose is recommended after age 51. Talk to your health care provider about which screenings and vaccines you need and how often you need them. This information is not intended to replace advice given to you by your health care provider. Make sure you discuss any questions you have with your health care provider. Document  Released: 06/27/2015 Document Revised: 02/18/2016 Document Reviewed: 04/01/2015 Elsevier Interactive Patient Education  2017 [employer].  Fall Prevention in the Home Falls can cause injuries. They can happen to people of all ages. There are many things you can do to make your home safe and to help prevent falls. What can I do on the outside of my home? Regularly fix the edges of walkways and driveways and fix any cracks. Remove anything that might make you trip as you walk through a door, such as a raised step or threshold. Trim any bushes or trees on the path to your home. Use bright outdoor lighting. Clear any walking paths of anything that might make someone trip, such as rocks or tools. Regularly check to see if handrails are loose or broken. Make sure that both sides of any steps have handrails. Any raised decks and porches should have guardrails on the edges. Have any leaves, snow, or ice cleared regularly. Use sand or salt on walking paths during winter. Clean up any spills in your garage right away. This includes oil or grease spills. What can I do in the bathroom? Use night lights. Install grab bars by the toilet and in the tub and shower. Do not use towel bars as grab bars. Use non-skid mats or decals in the tub or shower. If you need to sit down in the shower, use a plastic, non-slip stool. Keep the floor dry. Clean up any water that spills on the floor as soon as it  happens. Remove soap buildup in the tub or shower regularly. Attach bath mats securely with double-sided non-slip rug tape. Do not have throw rugs and other things on the floor that can make you trip. What can I do in the bedroom? Use night lights. Make sure that you have a light by your bed that is easy to reach. Do not use any sheets or blankets that are too big for your bed. They should not hang down onto the floor. Have a firm chair that has side arms. You can use this for support while you get dressed. Do not have throw rugs and other things on the floor that can make you trip. What can I do in the kitchen? Clean up any spills right away. Avoid walking on wet floors. Keep items that you use a lot in easy-to-reach places. If you need to reach something above you, use a strong step stool that has a grab bar. Keep electrical cords out of the way. Do not use floor polish or wax that makes floors slippery. If you must use wax, use non-skid floor wax. Do not have throw rugs and other things on the floor that can make you trip. What can I do with my stairs? Do not leave any items on the stairs. Make sure that there are handrails on both sides of the stairs and use them. Fix handrails that are broken or loose. Make sure that handrails are as long as the stairways. Check any carpeting to make sure that it is firmly attached to the stairs. Fix any carpet that is loose or worn. Avoid having throw rugs at the top or bottom of the stairs. If you do have throw rugs, attach them to the floor with carpet tape. Make sure that you have a light switch at the top of the stairs and the bottom of the stairs. If you do not have them, ask someone to add them for you. What else can I do to help prevent  falls? Wear shoes that: Do not have high heels. Have rubber bottoms. Are comfortable and fit you well. Are closed at the toe. Do not wear sandals. If you use a stepladder: Make sure that it is fully opened.  Do not climb a closed stepladder. Make sure that both sides of the stepladder are locked into place. Ask someone to hold it for you, if possible. Clearly mark and make sure that you can see: Any grab bars or handrails. First and last steps. Where the edge of each step is. Use tools that help you move around (mobility aids) if they are needed. These include: Canes. Walkers. Scooters. Crutches. Turn on the lights when you go into a dark area. Replace any light bulbs as soon as they burn out. Set up your furniture so you have a clear path. Avoid moving your furniture around. If any of your floors are uneven, fix them. If there are any pets around you, be aware of where they are. Review your medicines with your doctor. Some medicines can make you feel dizzy. This can increase your chance of falling. Ask your doctor what other things that you can do to help prevent falls. This information is not intended to replace advice given to you by your health care provider. Make sure you discuss any questions you have with your health care provider. Document Released: 03/27/2009 Document Revised: 11/06/2015 Document Reviewed: 07/05/2014 Elsevier Interactive Patient Education  2017 [employer].

## 2022-12-24 NOTE — Progress Notes (Signed)
Subjective:   Deanna Olson is a 89 y.o. female who presents for Medicare Annual (Subsequent) preventive examination.  Visit Complete: In person  Review of Systems    Cardiac Risk Factors include: advanced age (>90men, >61 women);hypertension;dyslipidemia;obesity (BMI >30kg/m2);Other (see comment), Risk factor comments: CKD, osteoporosis     Objective:    Today's Vitals   12/24/22 1111  BP: 130/70  Weight: 154 lb 6.4 oz (70 kg)  Height: 4' 11.5" (1.511 m)   Body mass index is 30.66 kg/m.     12/24/2022   11:31 AM 12/26/2021    7:00 AM 02/13/2021    9:01 AM 11/20/2018   10:26 AM 11/14/2017   10:40 AM 08/26/2015   10:49 AM  Advanced Directives  Does Patient Have a Medical Advance Directive? Yes No No Yes Yes Yes  Type of Estate agent of [place];Living will   Healthcare Power of [place];Living will Healthcare Power of [place];Living will   Does patient want to make changes to medical advance directive?   No - Patient declined     Copy of Healthcare Power of Attorney in Chart? No - copy requested   No - copy requested No - copy requested Yes  Would patient like information on creating a medical advance directive?  No - Patient declined        Current Medications (verified) Outpatient Encounter Medications as of 12/24/2022  Medication Sig   acetaminophen (TYLENOL) 500 MG tablet Take 500 mg by mouth daily as needed for moderate pain.   ALPRAZolam (XANAX) 0.5 MG tablet TAKE 1 TABLET BY MOUTH TWICE A DAY   Apoaequorin (PREVAGEN PO) Take 1 capsule by mouth daily.   Ascorbic Acid (VITAMIN C PO) Take 1 tablet by mouth daily.   b complex vitamins capsule Take 1 capsule by mouth daily.   Bioflavonoid Products (BIOFLEX) TABS Take 1 tablet by mouth daily.   Cholecalciferol (VITAMIN D-3 PO) Take 1 capsule by mouth daily.   diphenhydramine-acetaminophen (TYLENOL PM) 25-500 MG TABS tablet Take 1 tablet by mouth at bedtime as needed (pain, sleep).   gabapentin  (NEURONTIN) 100 MG capsule Take 1 capsule (100 mg total) by mouth in the morning.   gabapentin (NEURONTIN) 300 MG capsule TAKE 1 CAPSULE BY MOUTH EVERY NIGHT AT BEDTIME   hydrocortisone 2.5 % ointment Apply topically.   ketoconazole (NIZORAL) 2 % shampoo Apply 1 Application topically every Friday.   levothyroxine (SYNTHROID) 50 MCG tablet TAKE 1 TABLET BY MOUTH DAILY BEFORE BREAKFAST   Lidocaine 4 % PTCH Apply 1 patch topically daily.   losartan (COZAAR) 50 MG tablet Take 1 tablet (50 mg total) by mouth daily.   mirabegron ER (MYRBETRIQ) 50 MG TB24 tablet Take 1 tablet (50 mg total) by mouth daily.   Multiple Vitamins-Minerals (PRESERVISION AREDS 2) CAPS Take 1 capsule by mouth daily.   omeprazole (PRILOSEC) 20 MG capsule TAKE ONE CAPSULE BY MOUTH DAILY ON AN EMPTY STOMACH   Polyethyl Glycol-Propyl Glycol 0.4-0.3 % SOLN Apply 1 drop to eye 2 (two) times daily as needed (dry eyes).   POTASSIUM PO Take 1 tablet by mouth daily.   pravastatin (PRAVACHOL) 20 MG tablet TAKE ONE TABLET BY MOUTH DAILY   VITAMIN E PO Take 1 capsule by mouth daily.   betamethasone dipropionate 0.05 % lotion APPLY TOPICALLY DAILY AS NEEDED USES ON SCALP AND EARS   guaiFENesin-dextromethorphan (ROBITUSSIN DM) 100-10 MG/5ML syrup Take 5 mLs by mouth every 4 (four) hours as needed for cough. (Patient not taking: Reported  on 12/24/2022)   predniSONE (DELTASONE) 20 MG tablet Take 2 tablets (40 mg total) by mouth daily with breakfast. (Patient not taking: Reported on 12/24/2022)   No facility-administered encounter medications on file as of 12/24/2022.    Allergies (verified) Sulfonamide derivatives   History: Past Medical History:  Diagnosis Date   Allergic rhinitis, cause unspecified    Anxiety state, unspecified    Aortic valve disorders    Mild AS 2013 echo    Eczema    Esophageal reflux    Herpes zoster without mention of complication    Hyperlipidemia    Hypertension    Hypothyroid    Osteoarthrosis,  unspecified whether generalized or localized, unspecified site    Unspecified venous (peripheral) insufficiency    Past Surgical History:  Procedure Laterality Date   CATARACT EXTRACTION, BILATERAL  2013   KYPHOPLASTY  02/2018   VESICOVAGINAL FISTULA CLOSURE W/ TAH     Family History  Problem Relation Age of Onset   Stroke Mother 67   Diabetes Mother    Diabetes Brother    Heart attack Brother 92       s/p open heart surg   CAD Brother 27   Cancer Sister        4 types   Social History   Socioeconomic History   Marital status: Widowed    Spouse name: (wilbur Stankowski)   Number of children: 2   Years of education: Not on file   Highest education level: Not on file  Occupational History   Occupation: retired  Tobacco Use   Smoking status: Never   Smokeless tobacco: Never  Vaping Use   Vaping status: Never Used  Substance and Sexual Activity   Alcohol use: Never    Alcohol/week: 0.0 standard drinks of alcohol   Drug use: Never   Sexual activity: Never  Other Topics Concern   Not on file  Social History Narrative   Not on file   Social Determinants of Health   Financial Resource Strain: Low Risk  (12/24/2022)   Overall Financial Resource Strain (CARDIA)    Difficulty of Paying Living Expenses: Not very hard  Food Insecurity: Unknown (12/24/2022)   Hunger Vital Sign    Worried About Running Out of Food in the Last Year: Never true    Ran Out of Food in the Last Year: Not on file  Transportation Needs: No Transportation Needs (12/24/2022)   PRAPARE - Administrator, Civil Service (Medical): No    Lack of Transportation (Non-Medical): No  Physical Activity: Inactive (12/24/2022)   Exercise Vital Sign    Days of Exercise per Week: 0 days    Minutes of Exercise per Session: 0 min  Stress: No Stress Concern Present (12/24/2022)   [employer] of Occupational Health - Occupational Stress Questionnaire    Feeling of Stress : Not at all  Social  Connections: Moderately Integrated (12/24/2022)   Social Connection and Isolation Panel [NHANES]    Frequency of Communication with Friends and Family: More than three times a week    Frequency of Social Gatherings with Friends and Family: Three times a week    Attends Religious Services: More than 4 times per year    Active Member of Clubs or Organizations: Yes    Attends Banker Meetings: 1 to 4 times per year    Marital Status: Widowed    Tobacco Counseling Counseling given: Not Answered   Clinical Intake:  Pre-visit preparation completed: Yes  Pain : No/denies pain     BMI - recorded: 30.66 Nutritional Status: BMI > 30  Obese Nutritional Risks: Nausea/ vomitting/ diarrhea Diabetes: No  How often do you need to have someone help you when you read instructions, pamphlets, or other written materials from your doctor or pharmacy?: 1 - Never  Interpreter Needed?: No  Information entered by :: Hally Colella, RMA   Activities of Daily Living    12/24/2022   11:16 AM 12/26/2021    4:52 AM  In your present state of health, do you have any difficulty performing the following activities:  Hearing? 1   Comment Wears hearing aids everyday   Vision? 0   Difficulty concentrating or making decisions? 1   Comment remembering   Walking or climbing stairs? 1   Dressing or bathing? 0   Doing errands, shopping? 1 1  Comment Daughter drives her around   Quarry manager and eating ? Y   Using the Toilet? N   In the past six months, have you accidently leaked urine? Y   Comment wears pads   Do you have problems with loss of bowel control? N   Managing your Medications? N   Managing your Finances? Y   Comment Her daughter helps her with paying her bills   Housekeeping or managing your Housekeeping? N     Patient Care Team: Myrlene Broker, MD as PCP - General (Internal Medicine) Rollene Rotunda, MD as PCP - Cardiology (Cardiology) Cherlyn Roberts, MD  (Dermatology) Mckinley Jewel, MD (Ophthalmology) Szabat, Vinnie Level, [facility] (Inactive) as Pharmacist (Pharmacist)  Indicate any recent Medical Services you may have received from other than [facility] providers in the past year (date may be approximate).     Assessment:   This is a routine wellness examination for Ger.  Hearing/Vision screen Hearing Screening - Comments:: Wears hearing aides  Dietary issues and exercise activities discussed:     Goals Addressed   None   Depression Screen    12/24/2022   11:44 AM 08/23/2022   10:59 AM 12/31/2021   10:42 AM 02/13/2021    9:05 AM 12/27/2019   11:07 AM 11/20/2018   10:27 AM 11/14/2017   12:09 PM  PHQ 2/9 Scores  PHQ - 2 Score 0 0 0 0 0 1 0  PHQ- 9 Score 0  0    0    Fall Risk    12/24/2022   11:33 AM 11/05/2022    1:51 PM 08/23/2022   10:59 AM 12/31/2021   10:42 AM 02/13/2021    8:59 AM  Fall Risk   Falls in the past year? 0 0 1 1 0  Number falls in past yr: 0 0 1 0 0  Injury with Fall? 0 0 1 1 0  Risk for fall due to : No Fall Risks;Medication side effect;History of fall(s)  History of fall(s)  No Fall Risks  Follow up Falls prevention discussed Falls evaluation completed Falls evaluation completed  Falls evaluation completed    MEDICARE RISK AT HOME:  Medicare Risk at Home - 12/24/22 1135     Any stairs in or around the home? No    If so, are there any without handrails? No    Home free of loose throw rugs in walkways, pet beds, electrical cords, etc? Yes    Adequate lighting in your home to reduce risk of falls? Yes    Life alert? Yes    Use of a cane, walker or w/c? Yes  Grab bars in the bathroom? No    Shower chair or bench in shower? No    Elevated toilet seat or a handicapped toilet? No             TIMED UP AND GO:  Was the test performed?  Yes  Length of time to ambulate 10 feet: 30 sec Gait slow and steady with assistive device    Cognitive Function:    11/14/2017   10:41 AM 08/26/2015   11:03 AM  MMSE -  Mini Mental State Exam  Not completed:  --  Orientation to time 5   Orientation to Place 5   Registration 3   Attention/ Calculation 5   Recall 3   Language- name 2 objects 2   Language- repeat 1   Language- follow 3 step command 3   Language- read & follow direction 1   Write a sentence 1   Copy design 1   Total score 30         12/24/2022   11:37 AM  6CIT Screen  What Year? 0 points  What month? 0 points  What time? 0 points  Count back from 20 2 points  Months in reverse 2 points  Repeat phrase 10 points  Total Score 14 points    Immunizations Immunization History  Administered Date(s) Administered   Fluad Quad(high Dose 65+) 02/08/2019, 03/13/2020   Influenza Split 03/17/2011, 04/06/2012   Influenza Whole 04/12/2005, 03/27/2008, 03/26/2009, 03/14/2010   Influenza, High Dose Seasonal PF 03/18/2016, 03/10/2017, 06/16/2018   Influenza,inj,Quad PF,6+ Mos 03/22/2013, 03/22/2014, 02/24/2015   PFIZER(Purple Top)SARS-COV-2 Vaccination 07/21/2019, 08/15/2019, 04/26/2020   Pneumococcal Conjugate-13 02/24/2015   Pneumococcal Polysaccharide-23 07/17/2013    TDAP status: Due, Education has been provided regarding the importance of this vaccine. Advised may receive this vaccine at local pharmacy or Health Dept. Aware to provide a copy of the vaccination record if obtained from local pharmacy or Health Dept. Verbalized acceptance and understanding.  Flu Vaccine status: Up to date Last season (2023) patient got vaccine at [facility] pharmacy.  Pneumococcal vaccine status: Up to date  Covid-19 vaccine status: Completed vaccines  Qualifies for Shingles Vaccine? Yes   Zostavax completed Yes   Shingrix Completed?: Yes  Screening Tests Health Maintenance  Topic Date Due   DTaP/Tdap/Td (1 - Tdap) Never done   COVID-19 Vaccine (4 - 2023-24 season) 02/12/2022   INFLUENZA VACCINE  01/13/2023   Medicare Annual Wellness (AWV)  12/24/2023   Pneumonia Vaccine 44+ Years old   Completed   DEXA SCAN  Completed   HPV VACCINES  Aged Out   Zoster Vaccines- Shingrix  Discontinued    Health Maintenance  Health Maintenance Due  Topic Date Due   DTaP/Tdap/Td (1 - Tdap) Never done   COVID-19 Vaccine (4 - 2023-24 season) 02/12/2022    Colorectal cancer screening: No longer required.   Mammogram status: No longer required due to age.  Bone Density status: Completed 11/21/2017. Results reflect: Bone density results: OSTEOPOROSIS. Repeat every 2 years.  Lung Cancer Screening: (Low Dose CT Chest recommended if Age 18-80 years, 20 pack-year currently smoking OR have quit w/in 15years.) does not qualify.   Lung Cancer Screening Referral: N/A  Additional Screening:  Hepatitis C Screening: does qualify  Vision Screening: Recommended annual ophthalmology exams for early detection of glaucoma and other disorders of the eye. Is the patient up to date with their annual eye exam?  Yes  Who is the provider or what is the name of  the office in which the patient attends annual eye exams? [facility] If pt is not established with a provider, would they like to be referred to a provider to establish care? No .   Dental Screening: Recommended annual dental exams for proper oral hygiene   Community Resource Referral / Chronic Care Management: CRR required this visit?  No   CCM required this visit?  No     Plan:     I have personally reviewed and noted the following in the patient's chart:   Medical and social history Use of alcohol, tobacco or illicit drugs  Current medications and supplements including opioid prescriptions. Patient is not currently taking opioid prescriptions. Functional ability and status Nutritional status Physical activity Advanced directives List of other physicians Hospitalizations, surgeries, and ER visits in previous 12 months Vitals Screenings to include cognitive, depression, and falls Referrals and appointments  In addition, I have  reviewed and discussed with patient certain preventive protocols, quality metrics, and best practice recommendations. A written personalized care plan for preventive services as well as general preventive health recommendations were provided to patient.     Tarez Bowns L Zacharey Jensen, CMA   12/24/2022   After Visit Summary: (MyChart) Due to this being a telephonic visit, the after visit summary with patients personalized plan was offered to patient via MyChart   Nurse Notes: Patient is accompanied with her daughter today. Patient has her annual eye exam in the next 2 weeks with Dr. Randon Goldsmith.  Patient discussed with me that she has been having a burning sensation in bilateral knees radiating down to shins, no pain but just a burning sensation.  Patient is also concerned about swelling in her left ankle.  I advised patient to elevate her feet and to drink more water.  Daughter will call to schedule with provider  if worsen.

## 2022-12-29 ENCOUNTER — Other Ambulatory Visit: Payer: Self-pay | Admitting: Internal Medicine

## 2022-12-30 DIAGNOSIS — K219 Gastro-esophageal reflux disease without esophagitis: Secondary | ICD-10-CM | POA: Diagnosis not present

## 2022-12-30 DIAGNOSIS — H9193 Unspecified hearing loss, bilateral: Secondary | ICD-10-CM | POA: Diagnosis not present

## 2022-12-30 DIAGNOSIS — H04123 Dry eye syndrome of bilateral lacrimal glands: Secondary | ICD-10-CM | POA: Diagnosis not present

## 2022-12-30 DIAGNOSIS — G8929 Other chronic pain: Secondary | ICD-10-CM | POA: Diagnosis not present

## 2022-12-30 DIAGNOSIS — G4700 Insomnia, unspecified: Secondary | ICD-10-CM | POA: Diagnosis not present

## 2022-12-30 DIAGNOSIS — M1990 Unspecified osteoarthritis, unspecified site: Secondary | ICD-10-CM | POA: Diagnosis not present

## 2022-12-30 DIAGNOSIS — E669 Obesity, unspecified: Secondary | ICD-10-CM | POA: Diagnosis not present

## 2022-12-30 DIAGNOSIS — I739 Peripheral vascular disease, unspecified: Secondary | ICD-10-CM | POA: Diagnosis not present

## 2022-12-30 DIAGNOSIS — I10 Essential (primary) hypertension: Secondary | ICD-10-CM | POA: Diagnosis not present

## 2022-12-30 DIAGNOSIS — E039 Hypothyroidism, unspecified: Secondary | ICD-10-CM | POA: Diagnosis not present

## 2022-12-30 DIAGNOSIS — H3530 Unspecified macular degeneration: Secondary | ICD-10-CM | POA: Diagnosis not present

## 2022-12-30 DIAGNOSIS — G629 Polyneuropathy, unspecified: Secondary | ICD-10-CM | POA: Diagnosis not present

## 2023-01-06 DIAGNOSIS — H353132 Nonexudative age-related macular degeneration, bilateral, intermediate dry stage: Secondary | ICD-10-CM | POA: Diagnosis not present

## 2023-01-06 DIAGNOSIS — H52203 Unspecified astigmatism, bilateral: Secondary | ICD-10-CM | POA: Diagnosis not present

## 2023-01-06 DIAGNOSIS — Z961 Presence of intraocular lens: Secondary | ICD-10-CM | POA: Diagnosis not present

## 2023-01-07 ENCOUNTER — Telehealth: Payer: Self-pay | Admitting: Internal Medicine

## 2023-01-07 ENCOUNTER — Other Ambulatory Visit: Payer: Self-pay | Admitting: Internal Medicine

## 2023-01-07 NOTE — Telephone Encounter (Signed)
Patient has one pill left.   Prescription Request  01/07/2023  LOV: 11/05/2022  What is the name of the medication or equipment?  ALPRAZolam (XANAX) 0.5 MG tablet  Have you contacted your pharmacy to request a refill? Yes   Which pharmacy would you like this sent to?  [id number] [place], [place] - [address] [place] [address] Phone: [phone] Fax: [phone]    Patient notified that their request is being sent to the clinical staff for review and that they should receive a response within 2 business days.   Please advise at Mobile [phone] (mobile)

## 2023-01-10 MED ORDER — ALPRAZOLAM 0.5 MG PO TABS: 0.5000 mg | ORAL_TABLET | Freq: Two times a day (BID) | ORAL | 5 refills | Status: DC

## 2023-01-10 NOTE — Telephone Encounter (Signed)
Refill sent in

## 2023-01-27 ENCOUNTER — Encounter (INDEPENDENT_AMBULATORY_CARE_PROVIDER_SITE_OTHER): Payer: Self-pay

## 2023-02-15 ENCOUNTER — Encounter: Payer: Self-pay | Admitting: Internal Medicine

## 2023-02-15 ENCOUNTER — Ambulatory Visit (INDEPENDENT_AMBULATORY_CARE_PROVIDER_SITE_OTHER): Payer: PPO | Admitting: Internal Medicine

## 2023-02-15 VITALS — BP 160/60 | HR 66 | Temp 97.6°F | Ht 59.5 in | Wt 153.0 lb

## 2023-02-15 DIAGNOSIS — I10 Essential (primary) hypertension: Secondary | ICD-10-CM

## 2023-02-15 DIAGNOSIS — N1831 Chronic kidney disease, stage 3a: Secondary | ICD-10-CM

## 2023-02-15 DIAGNOSIS — E559 Vitamin D deficiency, unspecified: Secondary | ICD-10-CM

## 2023-02-15 DIAGNOSIS — Z23 Encounter for immunization: Secondary | ICD-10-CM | POA: Diagnosis not present

## 2023-02-15 DIAGNOSIS — F411 Generalized anxiety disorder: Secondary | ICD-10-CM | POA: Diagnosis not present

## 2023-02-15 DIAGNOSIS — E782 Mixed hyperlipidemia: Secondary | ICD-10-CM | POA: Diagnosis not present

## 2023-02-15 DIAGNOSIS — E039 Hypothyroidism, unspecified: Secondary | ICD-10-CM | POA: Diagnosis not present

## 2023-02-15 DIAGNOSIS — G629 Polyneuropathy, unspecified: Secondary | ICD-10-CM | POA: Diagnosis not present

## 2023-02-15 DIAGNOSIS — F0280 Dementia in other diseases classified elsewhere without behavioral disturbance: Secondary | ICD-10-CM | POA: Diagnosis not present

## 2023-02-15 DIAGNOSIS — N3281 Overactive bladder: Secondary | ICD-10-CM | POA: Diagnosis not present

## 2023-02-15 DIAGNOSIS — G309 Alzheimer's disease, unspecified: Secondary | ICD-10-CM

## 2023-02-15 LAB — CBC
HCT: 39.1 % (ref 36.0–46.0)
Hemoglobin: 12.6 g/dL (ref 12.0–15.0)
MCHC: 32.3 g/dL (ref 30.0–36.0)
MCV: 94.5 fl (ref 78.0–100.0)
Platelets: 182 10*3/uL (ref 150.0–400.0)
RBC: 4.13 Mil/uL (ref 3.87–5.11)
RDW: 15 % (ref 11.5–15.5)
WBC: 7.7 10*3/uL (ref 4.0–10.5)

## 2023-02-15 LAB — COMPREHENSIVE METABOLIC PANEL
ALT: 14 U/L (ref 0–35)
AST: 22 U/L (ref 0–37)
Albumin: 4 g/dL (ref 3.5–5.2)
Alkaline Phosphatase: 43 U/L (ref 39–117)
BUN: 21 mg/dL (ref 6–23)
CO2: 28 meq/L (ref 19–32)
Calcium: 9.6 mg/dL (ref 8.4–10.5)
Chloride: 102 meq/L (ref 96–112)
Creatinine, Ser: 1.01 mg/dL (ref 0.40–1.20)
GFR: 47.48 mL/min — ABNORMAL LOW (ref >60.00)
Glucose, Bld: 94 mg/dL (ref 70–99)
Potassium: 4.1 meq/L (ref 3.5–5.1)
Sodium: 138 meq/L (ref 135–145)
Total Bilirubin: 0.3 mg/dL (ref 0.2–1.2)
Total Protein: 7.2 g/dL (ref 6.0–8.3)

## 2023-02-15 LAB — LIPID PANEL
Cholesterol: 204 mg/dL — ABNORMAL HIGH (ref 0–200)
HDL: 44.9 mg/dL (ref >39.00)
LDL Cholesterol: 108 mg/dL — ABNORMAL HIGH (ref 0–99)
NonHDL: 158.72
Total CHOL/HDL Ratio: 5
Triglycerides: 252 mg/dL — ABNORMAL HIGH (ref 0.0–149.0)
VLDL: 50.4 mg/dL — ABNORMAL HIGH (ref 0.0–40.0)

## 2023-02-15 NOTE — Progress Notes (Unsigned)
   Subjective:   Patient ID: Deanna Olson, female    DOB: June 28, 1925, 89 y.o.   MRN: [id number]  HPI The patient is a 89 YO female coming in for follow up. New memory changes and some tingling in legs which is worsening overall.   Review of Systems  Constitutional: Negative.   HENT: Negative.    Eyes: Negative.   Respiratory:  Negative for cough, chest tightness and shortness of breath.   Cardiovascular:  Negative for chest pain, palpitations and leg swelling.  Gastrointestinal:  Negative for abdominal distention, abdominal pain, constipation, diarrhea, nausea and vomiting.  Musculoskeletal:  Positive for arthralgias.  Skin: Negative.   Neurological:  Positive for numbness.       Memory change  Psychiatric/Behavioral: Negative.      Objective:  Physical Exam Constitutional:      Appearance: She is well-developed.  HENT:     Head: Normocephalic and atraumatic.  Cardiovascular:     Rate and Rhythm: Normal rate and regular rhythm.  Pulmonary:     Effort: Pulmonary effort is normal. No respiratory distress.     Breath sounds: Normal breath sounds. No wheezing or rales.  Abdominal:     General: Bowel sounds are normal. There is no distension.     Palpations: Abdomen is soft.     Tenderness: There is no abdominal tenderness. There is no rebound.  Musculoskeletal:        General: Tenderness present.     Cervical back: Normal range of motion.  Skin:    General: Skin is warm and dry.  Neurological:     Mental Status: She is alert and oriented to person, place, and time.     Coordination: Coordination abnormal.     Vitals:   02/15/23 1324 02/15/23 1327  BP: (!) 160/60 (!) 160/60  Pulse: 66   Temp: 97.6 F (36.4 C)   TempSrc: Oral   SpO2: 97%   Weight: 153 lb (69.4 kg)   Height: 4' 11.5" (1.511 m)     Assessment & Plan:  Flu shot given at visit

## 2023-02-15 NOTE — Patient Instructions (Signed)
We will check the labs today. 

## 2023-02-16 LAB — VITAMIN B12: Vitamin B-12: 541 pg/mL (ref 211–911)

## 2023-02-16 LAB — TSH: TSH: 3.63 u[IU]/mL (ref 0.35–5.50)

## 2023-02-16 LAB — VITAMIN D 25 HYDROXY (VIT D DEFICIENCY, FRACTURES): VITD: 77.71 ng/mL (ref 30.00–100.00)

## 2023-02-17 DIAGNOSIS — N3281 Overactive bladder: Secondary | ICD-10-CM | POA: Insufficient documentation

## 2023-02-17 DIAGNOSIS — F0280 Dementia in other diseases classified elsewhere without behavioral disturbance: Secondary | ICD-10-CM | POA: Insufficient documentation

## 2023-02-17 NOTE — Assessment & Plan Note (Signed)
Checking TSH and adjust levothyroxine 50 mcg daily as needed.

## 2023-02-17 NOTE — Assessment & Plan Note (Signed)
Checking CMP for stability and adjust as needed.

## 2023-02-17 NOTE — Assessment & Plan Note (Signed)
Checking lipid panel and adjust pravastatin 20 mg daily as needed. 

## 2023-02-17 NOTE — Assessment & Plan Note (Signed)
Checking vitamin D and adjust as needed. 

## 2023-02-17 NOTE — Assessment & Plan Note (Signed)
BP mildly elevated here but home readings track normal will not change regimen she is a fall risk. Taking losartan 50 mg daily. Checking CMP and lipid panel and adjust as needed.

## 2023-02-17 NOTE — Assessment & Plan Note (Signed)
Overall some progression. Taking gabapentin at night time and they do not need dose adjustment today. Can adjust if needed.

## 2023-02-17 NOTE — Assessment & Plan Note (Signed)
Taking myrbetriq 50 mg daily and helping some. Will continue.

## 2023-02-17 NOTE — Assessment & Plan Note (Addendum)
Suspect age onset alzheimer's with memory changes. She is cared for by 3 daughters and they handle finances. She does not drive. Advised can use alprazolam for any behavioral changes or confusion episodes with anxiety.

## 2023-02-17 NOTE — Assessment & Plan Note (Signed)
Uses alprazolam rarely for anxiety and we discussed with daughter and patient that this can be used for problems with confusion or irritability with her memory changes.

## 2023-02-22 ENCOUNTER — Other Ambulatory Visit: Payer: Self-pay | Admitting: Internal Medicine

## 2023-03-01 ENCOUNTER — Other Ambulatory Visit: Payer: Self-pay | Admitting: Internal Medicine

## 2023-03-02 NOTE — Progress Notes (Unsigned)
Cardiology Office Note:   Date:  03/03/2023  ID:  Waunita Schooner, DOB July 05, 1925, MRN [id number] PCP: Myrlene Broker, MD  [facility] Providers Cardiologist:  Rollene Rotunda, MD {  History of Present Illness:   Deanna Olson is a 89 y.o. female who presents evaluation of aortic stenosis. This  was mild on echo in 2016.  It was moderate on echo Sept 2023.  This progressed on an echo ordered by her primary provider in September.  She lives at home independently.  She does some mild chores. The patient denies any new symptoms such as chest discomfort, neck or arm discomfort. There has been no new shortness of breath, PND or orthopnea. There have been no reported palpitations, presyncope or syncope.     ROS: As stated in the HPI and negative for all other systems.  Studies Reviewed:    EKG:   EKG Interpretation Date/Time:  Thursday March 03 2023 13:49:10 EDT Ventricular Rate:  74 PR Interval:  206 QRS Duration:  126 QT Interval:  418 QTC Calculation: 463 R Axis:   -30  Text Interpretation: Sinus rhythm with Premature atrial complexes Left axis deviation Left bundle branch block When compared with ECG of 26-Dec-2021 22:31, Premature atrial complexes are now Present Left bundle branch block is now Present Confirmed by Rollene Rotunda ([id number]) on 03/03/2023 2:16:07 PM    Risk Assessment/Calculations:              Physical Exam:   VS:  BP 130/68 (BP Location: Right Arm, Patient Position: Sitting, Cuff Size: Normal)   Pulse 74   Ht 5' (1.524 m)   Wt 153 lb 6.4 oz (69.6 kg)   SpO2 94%   BMI 29.96 kg/m    Wt Readings from Last 3 Encounters:  03/03/23 153 lb 6.4 oz (69.6 kg)  02/15/23 153 lb (69.4 kg)  12/24/22 154 lb 6.4 oz (70 kg)     GEN: Well nourished, well developed in no acute distress NECK: No JVD; No carotid bruits CARDIAC: RRR, 3 out of 6 late peaking systolic murmur radiating at the aortic outflow tract, no diastolic murmurs, rubs, gallops RESPIRATORY:   Clear to auscultation without rales, wheezing or rhonchi  ABDOMEN: Soft, non-tender, non-distended EXTREMITIES:  No edema; No deformity   ASSESSMENT AND PLAN:   Aortic stenosis:   He does have significant aortic stenosis with parvus at tardus but she actually has no symptoms related to this and she would not want anything done about this.  Therefore, no further imaging is going to be indicated.  We did talk about symptoms if it develop and calling 911 if she gets acutely short of breath.  We talked about salt restriction.   LBBB:    He has no symptoms related to this.  No change in therapy.   HTN:    The BP is well-controlled.  Continue the meds as listed.         Follow up with me as needed.   Signed, Rollene Rotunda, MD

## 2023-03-03 ENCOUNTER — Encounter: Payer: Self-pay | Admitting: Cardiology

## 2023-03-03 ENCOUNTER — Ambulatory Visit: Payer: PPO | Attending: Cardiology | Admitting: Cardiology

## 2023-03-03 VITALS — BP 130/68 | HR 74 | Ht 60.0 in | Wt 153.4 lb

## 2023-03-03 DIAGNOSIS — I350 Nonrheumatic aortic (valve) stenosis: Secondary | ICD-10-CM | POA: Diagnosis not present

## 2023-03-03 DIAGNOSIS — I447 Left bundle-branch block, unspecified: Secondary | ICD-10-CM | POA: Diagnosis not present

## 2023-03-03 DIAGNOSIS — I10 Essential (primary) hypertension: Secondary | ICD-10-CM

## 2023-03-03 NOTE — Patient Instructions (Signed)
Medication Instructions:  No changes *If you need a refill on your cardiac medications before your next appointment, please call your pharmacy*   Lab Work: No Labs If you have labs (blood work) drawn today and your tests are completely normal, you will receive your results only by: MyChart Message (if you have MyChart) OR A paper copy in the mail If you have any lab test that is abnormal or we need to change your treatment, we will call you to review the results.   Testing/Procedures: No Testing   Follow-Up: At [facility], you and your health needs are our priority.  As part of our continuing mission to provide you with exceptional heart care, we have created designated Provider Care Teams.  These Care Teams include your primary Cardiologist (physician) and Advanced Practice Providers (APPs -  Physician Assistants and Nurse Practitioners) who all work together to provide you with the care you need, when you need it.  We recommend signing up for the patient portal called "MyChart".  Sign up information is provided on this After Visit Summary.  MyChart is used to connect with patients for Virtual Visits (Telemedicine).  Patients are able to view lab/test results, encounter notes, upcoming appointments, etc.  Non-urgent messages can be sent to your provider as well.   To learn more about what you can do with MyChart, go to [email or web address].    Your next appointment:   As needed

## 2023-04-10 ENCOUNTER — Other Ambulatory Visit: Payer: Self-pay | Admitting: Internal Medicine

## 2023-04-22 ENCOUNTER — Emergency Department (HOSPITAL_BASED_OUTPATIENT_CLINIC_OR_DEPARTMENT_OTHER): Payer: PPO

## 2023-04-22 ENCOUNTER — Other Ambulatory Visit: Payer: Self-pay

## 2023-04-22 ENCOUNTER — Encounter (HOSPITAL_BASED_OUTPATIENT_CLINIC_OR_DEPARTMENT_OTHER): Payer: Self-pay

## 2023-04-22 ENCOUNTER — Emergency Department (HOSPITAL_BASED_OUTPATIENT_CLINIC_OR_DEPARTMENT_OTHER)
Admission: EM | Admit: 2023-04-22 | Discharge: 2023-04-22 | Disposition: A | Discharge status: Home / Self Care | Payer: PPO

## 2023-04-22 ENCOUNTER — Emergency Department (HOSPITAL_BASED_OUTPATIENT_CLINIC_OR_DEPARTMENT_OTHER): Payer: PPO | Admitting: Radiology

## 2023-04-22 ENCOUNTER — Ambulatory Visit: Payer: PPO | Admitting: Family Medicine

## 2023-04-22 DIAGNOSIS — W010XXA Fall on same level from slipping, tripping and stumbling without subsequent striking against object, initial encounter: Secondary | ICD-10-CM | POA: Diagnosis not present

## 2023-04-22 DIAGNOSIS — M51369 Other intervertebral disc degeneration, lumbar region without mention of lumbar back pain or lower extremity pain: Secondary | ICD-10-CM | POA: Insufficient documentation

## 2023-04-22 DIAGNOSIS — I700 Atherosclerosis of aorta: Secondary | ICD-10-CM | POA: Insufficient documentation

## 2023-04-22 DIAGNOSIS — Z043 Encounter for examination and observation following other accident: Secondary | ICD-10-CM | POA: Diagnosis not present

## 2023-04-22 DIAGNOSIS — Z602 Problems related to living alone: Secondary | ICD-10-CM | POA: Diagnosis not present

## 2023-04-22 DIAGNOSIS — M8580 Other specified disorders of bone density and structure, unspecified site: Secondary | ICD-10-CM | POA: Insufficient documentation

## 2023-04-22 DIAGNOSIS — M47816 Spondylosis without myelopathy or radiculopathy, lumbar region: Secondary | ICD-10-CM | POA: Diagnosis not present

## 2023-04-22 DIAGNOSIS — M79602 Pain in left arm: Secondary | ICD-10-CM | POA: Diagnosis not present

## 2023-04-22 DIAGNOSIS — R7989 Other specified abnormal findings of blood chemistry: Secondary | ICD-10-CM | POA: Insufficient documentation

## 2023-04-22 DIAGNOSIS — R748 Abnormal levels of other serum enzymes: Secondary | ICD-10-CM | POA: Diagnosis not present

## 2023-04-22 DIAGNOSIS — M545 Low back pain, unspecified: Secondary | ICD-10-CM | POA: Insufficient documentation

## 2023-04-22 DIAGNOSIS — G9389 Other specified disorders of brain: Secondary | ICD-10-CM | POA: Diagnosis not present

## 2023-04-22 DIAGNOSIS — W19XXXA Unspecified fall, initial encounter: Secondary | ICD-10-CM

## 2023-04-22 LAB — URINALYSIS, ROUTINE W REFLEX MICROSCOPIC
Bilirubin Urine: NEGATIVE
Glucose, UA: NEGATIVE mg/dL
Hgb urine dipstick: NEGATIVE
Ketones, ur: NEGATIVE mg/dL
Leukocytes,Ua: NEGATIVE
Nitrite: NEGATIVE
Protein, ur: NEGATIVE mg/dL
Specific Gravity, Urine: 1.019 (ref 1.005–1.030)
pH: 5.5 (ref 5.0–8.0)

## 2023-04-22 LAB — BASIC METABOLIC PANEL
Anion gap: 7 (ref 5–15)
BUN: 23 mg/dL (ref 8–23)
CO2: 28 mmol/L (ref 22–32)
Calcium: 10.2 mg/dL (ref 8.9–10.3)
Chloride: 102 mmol/L (ref 98–111)
Creatinine, Ser: 0.92 mg/dL (ref 0.44–1.00)
GFR, Estimated: 57 mL/min — ABNORMAL LOW (ref >60)
Glucose, Bld: 97 mg/dL (ref 70–99)
Potassium: 4.5 mmol/L (ref 3.5–5.1)
Sodium: 137 mmol/L (ref 135–145)

## 2023-04-22 LAB — CBC
HCT: 39 % (ref 36.0–46.0)
Hemoglobin: 12.8 g/dL (ref 12.0–15.0)
MCH: 30.3 pg (ref 26.0–34.0)
MCHC: 32.8 g/dL (ref 30.0–36.0)
MCV: 92.4 fL (ref 80.0–100.0)
Platelets: 190 10*3/uL (ref 150–400)
RBC: 4.22 MIL/uL (ref 3.87–5.11)
RDW: 14.8 % (ref 11.5–15.5)
WBC: 11.1 10*3/uL — ABNORMAL HIGH (ref 4.0–10.5)
nRBC: 0 % (ref 0.0–0.2)

## 2023-04-22 LAB — CK: Total CK: 1173 U/L — ABNORMAL HIGH (ref 38–234)

## 2023-04-22 MED ORDER — ACETAMINOPHEN 325 MG PO TABS
650.0000 mg | ORAL_TABLET | Freq: Once | ORAL | Status: AC
  Administered 2023-04-22: 650 mg via ORAL
  Filled 2023-04-22: qty 2

## 2023-04-22 MED ORDER — SODIUM CHLORIDE 0.9 % IV BOLUS
1000.0000 mL | Freq: Once | INTRAVENOUS | Status: AC
  Administered 2023-04-22: 1000 mL via INTRAVENOUS

## 2023-04-22 MED ORDER — LIDOCAINE 5 % EX PTCH
1.0000 | MEDICATED_PATCH | Freq: Once | CUTANEOUS | Status: DC
  Administered 2023-04-22: 1 via TRANSDERMAL
  Filled 2023-04-22: qty 1

## 2023-04-22 NOTE — ED Provider Notes (Addendum)
[place] EMERGENCY DEPARTMENT AT [facility] Provider Note   CSN: [id number] Arrival date & time: 04/22/23  1406     History  Chief Complaint  Patient presents with   Deanna Olson is a 89 y.o. female.  89 year old female presenting emergency department after presumed fall.  Went to bed in her bed, but stated she had a hard time getting comfortable secondary to some left arm pain that she has been having for the past several weeks.  She does not know what happened, but awoke on the floor and spent the night on the floor.  She lives home alone.  Family found her earlier today sitting somewhat on edge of bed.  Unsure if she hit her head, complains of pain to her left humerus which has been ongoing for the past several weeks, unchanged pain today.  As well as pain to her low back.  History of prior kyphoplasties, reports pain similar to normal back pain.   Fall       Home Medications Prior to Admission medications   Medication Sig Start Date End Date Taking? Authorizing Provider  acetaminophen (TYLENOL) 500 MG tablet Take 500 mg by mouth daily as needed for moderate pain.    [provider]  ALPRAZolam Prudy Feeler) 0.5 MG tablet Take 1 tablet (0.5 mg total) by mouth 2 (two) times daily. 01/10/23   Myrlene Broker, MD  Apoaequorin (PREVAGEN PO) Take 1 capsule by mouth daily.    [provider]  Ascorbic Acid (VITAMIN C PO) Take 1 tablet by mouth daily.    [provider]  b complex vitamins capsule Take 1 capsule by mouth daily.    [provider]  betamethasone dipropionate 0.05 % lotion APPLY TOPICALLY DAILY AS NEEDED USES ON SCALP AND EARS 12/22/22   Myrlene Broker, MD  Bioflavonoid Products (BIOFLEX) TABS Take 1 tablet by mouth daily.    [provider]  Cholecalciferol (VITAMIN D-3 PO) Take 1 capsule by mouth daily.    [provider]  diphenhydramine-acetaminophen (TYLENOL PM) 25-500 MG TABS tablet  Take 1 tablet by mouth at bedtime as needed (pain, sleep).    [provider]  gabapentin (NEURONTIN) 100 MG capsule TAKE 1 CAPSULE BY MOUTH EVERY MORNING 02/23/23   Myrlene Broker, MD  gabapentin (NEURONTIN) 300 MG capsule TAKE 1 CAPSULE BY MOUTH EVERY NIGHT AT BEDTIME 12/14/22   Myrlene Broker, MD  guaiFENesin-dextromethorphan (ROBITUSSIN DM) 100-10 MG/5ML syrup Take 5 mLs by mouth every 4 (four) hours as needed for cough. 10/30/21   Corwin Levins, MD  hydrocortisone 2.5 % ointment Apply topically. 12/02/21   [provider]  ketoconazole (NIZORAL) 2 % shampoo APPLY TO AFFECTED AREA(S) EVERY FRIDAY 02/23/23   Myrlene Broker, MD  levothyroxine (SYNTHROID) 50 MCG tablet TAKE 1 TABLET BY MOUTH DAILY BEFORE BREAKFAST 04/11/23   Myrlene Broker, MD  Lidocaine 4 % PTCH Apply 1 patch topically daily.    [provider]  losartan (COZAAR) 50 MG tablet Take 1 tablet (50 mg total) by mouth daily. 08/23/22   Myrlene Broker, MD  Multiple Vitamins-Minerals (PRESERVISION AREDS 2) CAPS Take 1 capsule by mouth daily.    [provider]  MYRBETRIQ 50 MG TB24 tablet TAKE 1 TABLET BY MOUTH DAILY **APPOINTMENT DUE FOR REFILLS** 02/23/23   Myrlene Broker, MD  omeprazole (PRILOSEC) 20 MG capsule Take 1 capsule (20 mg total) by mouth daily. Annual appt due in Sept must  see provider for future refills 12/29/22   Myrlene Broker, MD  Polyethyl Glycol-Propyl Glycol 0.4-0.3 % SOLN Apply 1 drop to eye 2 (two) times daily as needed (dry eyes).    [provider]  POTASSIUM PO Take 1 tablet by mouth daily.    [provider]  pravastatin (PRAVACHOL) 20 MG tablet TAKE 1 TABLET BY MOUTH DAILY 03/02/23   Myrlene Broker, MD  predniSONE (DELTASONE) 20 MG tablet Take 2 tablets (40 mg total) by mouth daily with breakfast. 11/05/22   Myrlene Broker, MD  VITAMIN E PO Take 1 capsule by mouth daily.    [provider]       Allergies    Sulfonamide derivatives    Review of Systems   Review of Systems  Physical Exam Updated Vital Signs BP (!) 146/62   Pulse 69   Temp 98 F (36.7 C)   Resp 18   SpO2 96%  Physical Exam Vitals and nursing note reviewed.  Constitutional:      General: She is not in acute distress.    Appearance: She is not toxic-appearing.  HENT:     Head: Normocephalic.     Nose: Nose normal.     Mouth/Throat:     Mouth: Mucous membranes are moist.  Eyes:     Conjunctiva/sclera: Conjunctivae normal.  Cardiovascular:     Rate and Rhythm: Normal rate and regular rhythm.  Pulmonary:     Effort: Pulmonary effort is normal.     Breath sounds: Normal breath sounds.  Abdominal:     General: Abdomen is flat. There is no distension.     Tenderness: There is no abdominal tenderness. There is no guarding or rebound.  Musculoskeletal:     Comments: No focal bony tenderness.  Full ROM to upper extremities.  2+ distal pulses in extremities.  Normal sensation.  Skin:    General: Skin is warm and dry.     Capillary Refill: Capillary refill takes less than 2 seconds.  Neurological:     Mental Status: She is alert and oriented to person, place, and time. Mental status is at baseline.  Psychiatric:        Mood and Affect: Mood normal.        Behavior: Behavior normal.     ED Results / Procedures / Treatments   Labs (all labs ordered are listed, but only abnormal results are displayed) Labs Reviewed  CBC - Abnormal; Notable for the following components:      Result Value   WBC 11.1 (*)    All other components within normal limits  BASIC METABOLIC PANEL - Abnormal; Notable for the following components:   GFR, Estimated 57 (*)    All other components within normal limits  CK - Abnormal; Notable for the following components:   Total CK 1,173 (*)    All other components within normal limits  URINALYSIS, ROUTINE W REFLEX MICROSCOPIC    EKG None  Radiology CT Head Wo  Contrast  Result Date: 04/22/2023 CLINICAL DATA:  Fall from bed EXAM: CT HEAD WITHOUT CONTRAST TECHNIQUE: Contiguous axial images were obtained from the base of the skull through the vertex without intravenous contrast. RADIATION DOSE REDUCTION: This exam was performed according to the departmental dose-optimization program which includes automated exposure control, adjustment of the mA and/or kV according to patient size and/or use of iterative reconstruction technique. COMPARISON:  None Available. FINDINGS: Brain: No evidence of acute infarction, hemorrhage, mass, mass effect, or midline  shift. No hydrocephalus or extra-axial fluid collection. Calcifications in the right periventricular white matter may reflect mineralization from prior hemorrhage or sequela of other insult. Periventricular white matter changes, likely the sequela of chronic small vessel ischemic disease. Cerebral volume is within normal limits for age. Vascular: No hyperdense vessel. Skull: Negative for fracture or focal lesion. Sinuses/Orbits: No acute finding. Other: The mastoid air cells are well aerated. IMPRESSION: No acute intracranial process. Electronically Signed   By: Wiliam Ke M.D.   On: 04/22/2023 19:04   DG Humerus Left  Result Date: 04/22/2023 CLINICAL DATA:  Status post fall EXAM: LEFT HUMERUS - 2+ VIEW COMPARISON:  12/25/2021 FINDINGS: There is no evidence of fracture or other focal bone lesions. Soft tissues are unremarkable. IMPRESSION: Negative. Electronically Signed   By: Signa Kell M.D.   On: 04/22/2023 17:25   DG Lumbar Spine Complete  Result Date: 04/22/2023 CLINICAL DATA:  Fall EXAM: LUMBAR SPINE - COMPLETE 4+ VIEW COMPARISON:  Lumbar spine x-ray 12/27/2018. MRI lumbar spine 02/24/2022 FINDINGS: The bones are osteopenic. There are vertebroplasty changes at L3 and L4. No acute fracture identified. Spinal alignment appears within normal limits. There is mild degenerative disc space change throughout the  lumbar spine. There are atherosclerotic calcifications of the aorta. IMPRESSION: 1. No acute fracture or traumatic listhesis of the lumbar spine. 2. Vertebroplasty changes at L3 and L4. Electronically Signed   By: Darliss Cheney M.D.   On: 04/22/2023 17:23    Procedures Procedures    Medications Ordered in ED Medications  lidocaine (LIDODERM) 5 % 1 patch (1 patch Transdermal Patch Applied 04/22/23 1735)  acetaminophen (TYLENOL) tablet 650 mg (650 mg Oral Given 04/22/23 1734)  sodium chloride 0.9 % bolus 1,000 mL (0 mLs Intravenous Stopped 04/22/23 2042)    ED Course/ Medical Decision Making/ A&P Clinical Course as of 04/22/23 2054  Fri Apr 22, 2023  1734 DG Lumbar Spine Complete IMPRESSION: 1. No acute fracture or traumatic listhesis of the lumbar spine. 2. Vertebroplasty changes at L3 and L4   [TY]  1734 DG Humerus Left Negative.   Electronically Signed   [TY]  1911 CT Head Wo Contrast IMPRESSION: No acute intracranial process.   [TY]  2053 UA negative.  Nonspecific leukocytosis, no obvious infection.  CK with mild elevation, but no renal impairment or electrolyte disturbances to suggest rhabdo.  Received IV fluids.  Shared decision making with patient and daughter regarding admission for further IV hydration versus close follow-up.  Offered admission, however daughter and patient would like to go home and follow-up with her primary doctor.  They were given strict return precautions.  Stable for discharge at this time. [TY]    Clinical Course User Index [TY] Coral Spikes, DO                                 Medical Decision Making This is a 89 year old female present emergency department after presumed fall and slipped on the floor all night.  She is afebrile vital signs reassuring.  Physical exam without overt sources of trauma.  She does take Xanax per chart review, does not appear to take blood thinners.  Daughter is at bedside states that she is essentially at baseline.   Given patient's end-stage and presumed prolonged time floor will get basic labs to evaluate for rhabdo.  Will get UA to evaluate for UTI.  CT head independently reviewed by myself without overt intracranial  pathology.  X-rays ordered by triage, reviewed.  Does not appear to have acute osseous pathology.  Given Tylenol and lidocaine patch here for pain.  See ED course for further MDM and disposition.  Amount and/or Complexity of Data Reviewed Labs: ordered. Radiology: ordered. Decision-making details documented in ED Course.  Risk OTC drugs. Prescription drug management.    CT scan ordered due to advanced age and possible fall to evaluate for traumatic intracranial process. It was negative.       Final Clinical Impression(s) / ED Diagnoses Final diagnoses:  Elevated CK  Fall, initial encounter    Rx / DC Orders ED Discharge Orders     None         Coral Spikes, DO 04/22/23 2054    Coral Spikes, DO 04/29/23 0005

## 2023-04-22 NOTE — ED Notes (Signed)
Pt states he is still unable to provide a urine sample 

## 2023-04-22 NOTE — ED Triage Notes (Signed)
Pt w daughter, states she "slept on the floor last night after falling from bed." Unsure if she was asleep during fall, "but I camped out."  Reports she lives alone, "I was all over that bed, I didn't have alert button because it was on the table."

## 2023-04-22 NOTE — Discharge Instructions (Signed)
Please follow-up with your primary doctor as discussed.  Return immediately felt fevers, chills, altered mental status, sudden onset headache, facial droop, chest pain, shortness of breath, abdominal pain, decreased urination, your urine turns dark tea colored or you develop any new or worsening symptoms that are concerning to you.  As discussed, please increase your fluids over the next couple days with water, low sugar Gatorade or Pedialyte.

## 2023-04-27 ENCOUNTER — Ambulatory Visit (INDEPENDENT_AMBULATORY_CARE_PROVIDER_SITE_OTHER): Payer: PPO | Admitting: Internal Medicine

## 2023-04-27 ENCOUNTER — Encounter: Payer: Self-pay | Admitting: Internal Medicine

## 2023-04-27 VITALS — BP 112/82 | HR 67 | Temp 97.9°F | Ht 60.0 in | Wt 152.0 lb

## 2023-04-27 DIAGNOSIS — N1831 Chronic kidney disease, stage 3a: Secondary | ICD-10-CM | POA: Diagnosis not present

## 2023-04-27 DIAGNOSIS — R748 Abnormal levels of other serum enzymes: Secondary | ICD-10-CM | POA: Diagnosis not present

## 2023-04-27 DIAGNOSIS — G309 Alzheimer's disease, unspecified: Secondary | ICD-10-CM

## 2023-04-27 DIAGNOSIS — F0280 Dementia in other diseases classified elsewhere without behavioral disturbance: Secondary | ICD-10-CM | POA: Diagnosis not present

## 2023-04-27 NOTE — Progress Notes (Unsigned)
   Subjective:   Patient ID: Deanna Olson, female    DOB: [birth year], 89 y.o.   MRN: [id number]  HPI The patient is a 89 YO female coming in for ER follow up for fall (fell in bedroom onto floor, could not get to phone for some hours, was not wearing life alert button, daughter then took her to ER once she got to phone). She got fluids in ER for elevated CK no AKI. She was discharged home and no fractures. She is doing okay at home and getting around. Daughter present with her and gives history as well thinks memory is declining more recently.   PMH, [facility], social history reviewed and updated  Review of Systems  Constitutional: Negative.   HENT: Negative.    Eyes: Negative.   Respiratory:  Negative for cough, chest tightness and shortness of breath.   Cardiovascular:  Negative for chest pain, palpitations and leg swelling.  Gastrointestinal:  Negative for abdominal distention, abdominal pain, constipation, diarrhea, nausea and vomiting.  Musculoskeletal:  Positive for myalgias.  Skin: Negative.   Neurological: Negative.        Memory change  Psychiatric/Behavioral: Negative.      Objective:  Physical Exam Constitutional:      Appearance: She is well-developed.  HENT:     Head: Normocephalic and atraumatic.  Cardiovascular:     Rate and Rhythm: Normal rate and regular rhythm.  Pulmonary:     Effort: Pulmonary effort is normal. No respiratory distress.     Breath sounds: Normal breath sounds. No wheezing or rales.  Abdominal:     General: Bowel sounds are normal. There is no distension.     Palpations: Abdomen is soft.     Tenderness: There is no abdominal tenderness. There is no rebound.  Musculoskeletal:        General: Tenderness present.     Cervical back: Normal range of motion.     Comments: Some bruising left shoulder with tenderness  Skin:    General: Skin is warm and dry.  Neurological:     Mental Status: She is alert and oriented to person, place, and time.      Coordination: Coordination normal.     Vitals:   04/27/23 1043  BP: 112/82  Pulse: 67  Temp: 97.9 F (36.6 C)  TempSrc: Oral  SpO2: 98%  Weight: 152 lb (68.9 kg)  Height: 5' (1.524 m)    Assessment & Plan:

## 2023-04-28 DIAGNOSIS — R748 Abnormal levels of other serum enzymes: Secondary | ICD-10-CM | POA: Insufficient documentation

## 2023-04-28 NOTE — Assessment & Plan Note (Signed)
Due to down time after fall. We discussed that this was relatively mildly elevated to 1000 and no AKI noticed. She is drinking plenty of fluids and no dark urine. Will not recheck per her preference.

## 2023-04-28 NOTE — Assessment & Plan Note (Signed)
Stable and reviewed recent GFR 57 as normal for age.

## 2023-04-28 NOTE — Assessment & Plan Note (Signed)
Overall worsening and recent fall with unclear etiology. Talked with daughter about maybe some discussions with her 3 daughters as to her living situation. She needs to wear alert button at all times. They are considering a change here.

## 2023-05-05 ENCOUNTER — Telehealth: Payer: Self-pay | Admitting: Internal Medicine

## 2023-05-05 NOTE — Telephone Encounter (Signed)
Pt states that her knee is still burning and hurting and wanted to know if there was anything else they can do

## 2023-05-05 NOTE — Telephone Encounter (Signed)
Pt wanting to speak with Micaiah about her visit and what she is experiencing pt kept it short with me couldn't get much from her please call pt back. Thanks

## 2023-05-06 ENCOUNTER — Telehealth: Payer: Self-pay

## 2023-05-06 NOTE — Telephone Encounter (Signed)
Can use tylenol (up to 1000 mg 3 times a day), voltaren gel, ice to help with pain.

## 2023-05-06 NOTE — Telephone Encounter (Signed)
Transition Care Management Follow-up Telephone Call Date of discharge and from where: 04/22/2023 [facility] How have you been since you were released from the hospital? Per patient's daughter/Deanna Olson on DPR patient is feeling a  little better but still has some soreness in her knees. Any questions or concerns? No  Items Reviewed: Did the pt receive and understand the discharge instructions provided? Yes  Medications obtained and verified? Yes  Other? No  Any new allergies since your discharge? No  Dietary orders reviewed? Yes Do you have support at home? Yes   Follow up appointments reviewed:  PCP Hospital f/u appt confirmed? Yes  Scheduled to see Lanora Manis A. Okey Dupre, MD on 04/27/2023 @ [facility] [place] f/u appt confirmed? No  Scheduled to see  on  @ . Are transportation arrangements needed? No  If their condition worsens, is the pt aware to call PCP or go to the Emergency Dept.? Yes Was the patient provided with contact information for the PCP's office or ED? Yes Was to pt encouraged to call back with questions or concerns? Yes   Nikya Busler [facility]  [facility], [facility] Guide Direct Dial: [phone]  Website: [email or web address]

## 2023-05-20 ENCOUNTER — Other Ambulatory Visit: Payer: Self-pay | Admitting: Internal Medicine

## 2023-06-03 ENCOUNTER — Ambulatory Visit: Payer: Self-pay | Admitting: Internal Medicine

## 2023-06-03 NOTE — Telephone Encounter (Addendum)
Copied from CRM [id number]. Topic: Clinical - Red Word Triage >> Jun 03, 2023 12:29 PM Florestine Avers wrote: Red Word that prompted transfer to Nurse Triage: Patients daughter called stating that her mom has a red rash underneath her breast, its reds, swollen and warm to the touch. Wants to know what she should do for that, or it there is something over the counter that she can use for it.  Chief Complaint: Rash Symptoms: Redness, soreness bilateral breasts Frequency: Ongoing for one week Pertinent Negatives: Patient denies itching  Disposition: [] ED /[] Urgent Care (no appt availability in office) / [] Appointment(In office/virtual)/ []  [place] [facility]/ [] Home Care/ [] Refused Recommended Disposition /[]  Mobile Bus/ [x]  Follow-up with PCP  Additional Notes:   Patient's daughter Glendon Axe called and stated that patient has a rash underneath and around both breasts. She is also developing a rash near her bottom. There is redness, soreness, and it burns when she tried applying aloe to the area. She is not sure what could be causing it. She stated that patient had this issue in the past (approximately one year ago). She stated Dr. Okey Dupre prescribed a medication that was effective and cleared up the issue. Attempted to schedule appointment, but there is no availability until December 31. Is it possible to send a prescription for now? Patient's daughter is aware that a message is being sent and the office will be reaching out. Please call daughter directly at [phone].  Reason for Disposition  [1] Applying cream or ointment AND [2] causes severe itch, burning or pain  Answer Assessment - Initial Assessment Questions 1. APPEARANCE of RASH: "Describe the rash."      Redness, soreness  2. LOCATION: "Where is the rash located?"      Under both breasts, on the sides of the breasts, and near patient's bottom  3. ONSET: "When did the rash start?"      Last Saturday  6. ITCHING: "Does the  rash itch?" If Yes, ask: "How bad is the itch?"  (Scale 0-10; or none, mild, moderate, severe)     No itching at this time  7. PAIN: "Does the rash hurt?" If Yes, ask: "How bad is the pain?"  (Scale 0-10; or none, mild, moderate, severe)    - NONE (0): no pain    - MILD (1-3): doesn't interfere with normal activities     - MODERATE (4-7): interferes with normal activities or awakens from sleep     - SEVERE (8-10): excruciating pain, unable to do any normal activities     Not really painful, but there is burning and soreness  8. OTHER SYMPTOMS: "Do you have any other symptoms?" (e.g., fever)     Affected area warm to touch  Protocols used: Rash or Redness - Localized-A-AH

## 2023-06-03 NOTE — Telephone Encounter (Signed)
   Chief Complaint: rash Symptoms: itching Frequency: constant  Disposition: [] ED /[x] Urgent Care (no appt availability in office) / [] Appointment(In office/virtual)/ []  [place] [facility]/ [] Home Care/ [] Refused Recommended Disposition /[] [place] Mobile Bus/ []  Follow-up with PCP Additional Notes: pt calling with rash under breasts and on bottom. Pt a little confused.  Pt stated the rash is slick and not raised. She is using OTC lotion for dry skin ,but it's not helping. No appt available in practice until 12/30.  Pt is wanting a cream to help and hoped the DR could call something in for hr. RN gave instructions to go to urgent care. Pt didn't understand the process , but gave address and phone number to UC. Lost connection and RN called home number back but no answer. RN called mobile number and her daughter Orlie Pollen answered. Orlie Pollen stated her husband is in the ER and she would call one of her sisters to handle this. Home instructions were given to pt before disconnection. Pt verbalized understanding somewhat. Topic: Clinical - Red Word Triage >> Jun 03, 2023 11:19 AM Lennart Pall wrote: Red Word that prompted transfer to Nurse Triage: Rash under breasts and bottom, getting worse today Reason for Disposition  Mild localized rash  Answer Assessment - Initial Assessment Questions 1.) CALLER DIAGNOSIS: "What do you think is causing the rash?" (e.g., athlete's foot, chickenpox, hives, impetigo) Not sure. Rash on breast and bottom. 2.) LOCALIZED OR WIDESPREAD:  "Is the rash all over (widespread) or mostly just in one area of the body (localized)?"  Rash isn't spreading but on both areas  3.) NEW MEDICINES: "Are you taking any new medicine?"Nothing new 4.) APPEARANCE of RASH: "Describe the rash. What color is it?" (Note: It is difficult to assess rash color in people with darker-colored skin. When this situation occurs, simply ask the caller to describe what they see.) Red, slick feeling. Uses  Humed lotion  OTC. Rash pooped up 2 weeks ago.  Protocols used: Rash - Guideline Selection-A-AH, Rash or Redness - Localized-A-AH

## 2023-06-05 ENCOUNTER — Other Ambulatory Visit: Payer: Self-pay | Admitting: Internal Medicine

## 2023-06-06 NOTE — Telephone Encounter (Signed)
Patient is NEEDING HER MEDICATION FILLED TODAY SHE IS OUT OF THE MEDICATION

## 2023-06-07 NOTE — Telephone Encounter (Signed)
Please advise as MD is out office

## 2023-06-12 ENCOUNTER — Emergency Department (HOSPITAL_BASED_OUTPATIENT_CLINIC_OR_DEPARTMENT_OTHER): Payer: PPO

## 2023-06-12 ENCOUNTER — Observation Stay (HOSPITAL_BASED_OUTPATIENT_CLINIC_OR_DEPARTMENT_OTHER)
Admission: EM | Admit: 2023-06-12 | Discharge: 2023-06-15 | Disposition: A | Discharge status: Home / Self Care | Payer: PPO | Attending: Family Medicine | Admitting: Family Medicine

## 2023-06-12 ENCOUNTER — Encounter (HOSPITAL_BASED_OUTPATIENT_CLINIC_OR_DEPARTMENT_OTHER): Payer: Self-pay | Admitting: Emergency Medicine

## 2023-06-12 ENCOUNTER — Other Ambulatory Visit: Payer: Self-pay

## 2023-06-12 DIAGNOSIS — R2681 Unsteadiness on feet: Secondary | ICD-10-CM | POA: Insufficient documentation

## 2023-06-12 DIAGNOSIS — G9389 Other specified disorders of brain: Secondary | ICD-10-CM | POA: Diagnosis not present

## 2023-06-12 DIAGNOSIS — I447 Left bundle-branch block, unspecified: Secondary | ICD-10-CM | POA: Diagnosis not present

## 2023-06-12 DIAGNOSIS — I129 Hypertensive chronic kidney disease with stage 1 through stage 4 chronic kidney disease, or unspecified chronic kidney disease: Secondary | ICD-10-CM | POA: Diagnosis not present

## 2023-06-12 DIAGNOSIS — R4182 Altered mental status, unspecified: Secondary | ICD-10-CM | POA: Diagnosis present

## 2023-06-12 DIAGNOSIS — N1832 Chronic kidney disease, stage 3b: Secondary | ICD-10-CM | POA: Insufficient documentation

## 2023-06-12 DIAGNOSIS — Z79899 Other long term (current) drug therapy: Secondary | ICD-10-CM | POA: Insufficient documentation

## 2023-06-12 DIAGNOSIS — I350 Nonrheumatic aortic (valve) stenosis: Secondary | ICD-10-CM | POA: Diagnosis not present

## 2023-06-12 DIAGNOSIS — F419 Anxiety disorder, unspecified: Secondary | ICD-10-CM | POA: Diagnosis not present

## 2023-06-12 DIAGNOSIS — R410 Disorientation, unspecified: Secondary | ICD-10-CM | POA: Diagnosis not present

## 2023-06-12 DIAGNOSIS — I700 Atherosclerosis of aorta: Secondary | ICD-10-CM | POA: Diagnosis not present

## 2023-06-12 DIAGNOSIS — M47816 Spondylosis without myelopathy or radiculopathy, lumbar region: Secondary | ICD-10-CM | POA: Diagnosis not present

## 2023-06-12 DIAGNOSIS — K219 Gastro-esophageal reflux disease without esophagitis: Secondary | ICD-10-CM | POA: Insufficient documentation

## 2023-06-12 DIAGNOSIS — G319 Degenerative disease of nervous system, unspecified: Secondary | ICD-10-CM | POA: Diagnosis not present

## 2023-06-12 DIAGNOSIS — E785 Hyperlipidemia, unspecified: Secondary | ICD-10-CM | POA: Insufficient documentation

## 2023-06-12 DIAGNOSIS — R000 Tachycardia, unspecified: Secondary | ICD-10-CM | POA: Diagnosis not present

## 2023-06-12 DIAGNOSIS — Z043 Encounter for examination and observation following other accident: Secondary | ICD-10-CM | POA: Diagnosis not present

## 2023-06-12 DIAGNOSIS — E039 Hypothyroidism, unspecified: Secondary | ICD-10-CM | POA: Diagnosis not present

## 2023-06-12 DIAGNOSIS — U071 COVID-19: Secondary | ICD-10-CM | POA: Diagnosis not present

## 2023-06-12 DIAGNOSIS — M419 Scoliosis, unspecified: Secondary | ICD-10-CM | POA: Diagnosis not present

## 2023-06-12 LAB — CBC WITH DIFFERENTIAL/PLATELET
Abs Immature Granulocytes: 0.07 10*3/uL (ref 0.00–0.07)
Basophils Absolute: 0.1 10*3/uL (ref 0.0–0.1)
Basophils Relative: 1 %
Eosinophils Absolute: 0.1 10*3/uL (ref 0.0–0.5)
Eosinophils Relative: 1 %
HCT: 36.3 % (ref 36.0–46.0)
Hemoglobin: 11.9 g/dL — ABNORMAL LOW (ref 12.0–15.0)
Immature Granulocytes: 1 %
Lymphocytes Relative: 20 %
Lymphs Abs: 1.4 10*3/uL (ref 0.7–4.0)
MCH: 30.4 pg (ref 26.0–34.0)
MCHC: 32.8 g/dL (ref 30.0–36.0)
MCV: 92.8 fL (ref 80.0–100.0)
Monocytes Absolute: 1.1 10*3/uL — ABNORMAL HIGH (ref 0.1–1.0)
Monocytes Relative: 15 %
Neutro Abs: 4.4 10*3/uL (ref 1.7–7.7)
Neutrophils Relative %: 62 %
Platelets: 163 10*3/uL (ref 150–400)
RBC: 3.91 MIL/uL (ref 3.87–5.11)
RDW: 15.2 % (ref 11.5–15.5)
WBC: 7.1 10*3/uL (ref 4.0–10.5)
nRBC: 0 % (ref 0.0–0.2)

## 2023-06-12 LAB — COMPREHENSIVE METABOLIC PANEL
ALT: 13 U/L (ref 0–44)
AST: 22 U/L (ref 15–41)
Albumin: 4.2 g/dL (ref 3.5–5.0)
Alkaline Phosphatase: 37 U/L — ABNORMAL LOW (ref 38–126)
Anion gap: 8 (ref 5–15)
BUN: 23 mg/dL (ref 8–23)
CO2: 28 mmol/L (ref 22–32)
Calcium: 9.2 mg/dL (ref 8.9–10.3)
Chloride: 100 mmol/L (ref 98–111)
Creatinine, Ser: 1.07 mg/dL — ABNORMAL HIGH (ref 0.44–1.00)
GFR, Estimated: 48 mL/min — ABNORMAL LOW (ref >60)
Glucose, Bld: 92 mg/dL (ref 70–99)
Potassium: 4.2 mmol/L (ref 3.5–5.1)
Sodium: 136 mmol/L (ref 135–145)
Total Bilirubin: 0.4 mg/dL (ref <1.2)
Total Protein: 7.3 g/dL (ref 6.5–8.1)

## 2023-06-12 LAB — URINALYSIS, ROUTINE W REFLEX MICROSCOPIC
Bilirubin Urine: NEGATIVE
Glucose, UA: NEGATIVE mg/dL
Hgb urine dipstick: NEGATIVE
Ketones, ur: NEGATIVE mg/dL
Nitrite: NEGATIVE
Protein, ur: NEGATIVE mg/dL
Specific Gravity, Urine: 1.019 (ref 1.005–1.030)
pH: 6 (ref 5.0–8.0)

## 2023-06-12 LAB — RESP PANEL BY RT-PCR (RSV, FLU A&B, COVID)  RVPGX2
Influenza A by PCR: NEGATIVE
Influenza B by PCR: NEGATIVE
Resp Syncytial Virus by PCR: NEGATIVE
SARS Coronavirus 2 by RT PCR: POSITIVE — AB

## 2023-06-12 LAB — AMMONIA: Ammonia: 17 umol/L (ref 9–35)

## 2023-06-12 NOTE — ED Provider Notes (Signed)
  Provider Note MRN:  [id number]  Arrival date & time: 06/12/23    ED Course and Medical Decision Making  Assumed care from Dr. Rhae Hammock at shift change.  Fall today, increased confusion, found to have COVID-19.  Awaiting imaging, may need admission for delirium in the setting of infection.  Procedures  Final Clinical Impressions(s) / ED Diagnoses     ICD-10-CM   1. Delirium  R41.0     2. COVID-19  U07.1       ED Discharge Orders     None       Discharge Instructions   None     Elmer Sow. Pilar Plate, MD [facility] Emergency Medicine [facility] [email or web address]    Sabas Sous, MD 06/13/23 [id number]

## 2023-06-12 NOTE — ED Provider Notes (Signed)
[place] EMERGENCY DEPARTMENT AT [facility] Provider Note   CSN: [id number] Arrival date & time: 06/12/23  2023     History  Chief Complaint  Patient presents with   Fall   Altered Mental Status    Deanna Olson is a 89 y.o. female.  89 year old female with past medical history of hypothyroidism and hypertension who does live at home alone currently presenting to the emergency department today with over the past few days.  They state that patient has had confusion over the past few months but this is acutely worse over the past few days.  They report that they went over to see the patient tonight and she was found on the floor.  The patient states that she lost her footing and fell.  She has had a cough over the past few days.  She was brought to the emergency department today for further evaluation.   Fall  Altered Mental Status      Home Medications Prior to Admission medications   Medication Sig Start Date End Date Taking? Authorizing Provider  acetaminophen (TYLENOL) 500 MG tablet Take 500 mg by mouth daily as needed for moderate pain.    [provider]  ALPRAZolam Prudy Feeler) 0.5 MG tablet Take 1 tablet (0.5 mg total) by mouth 2 (two) times daily. 01/10/23   Myrlene Broker, MD  Apoaequorin (PREVAGEN PO) Take 1 capsule by mouth daily.    [provider]  Ascorbic Acid (VITAMIN C PO) Take 1 tablet by mouth daily.    [provider]  b complex vitamins capsule Take 1 capsule by mouth daily.    [provider]  betamethasone dipropionate 0.05 % lotion APPLY TOPICALLY DAILY AS NEEDED USES ON SCALP AND EARS 12/22/22   Myrlene Broker, MD  Bioflavonoid Products (BIOFLEX) TABS Take 1 tablet by mouth daily.    [provider]  Cholecalciferol (VITAMIN D-3 PO) Take 1 capsule by mouth daily.    [provider]  diphenhydramine-acetaminophen (TYLENOL PM) 25-500 MG TABS tablet Take 1 tablet by mouth at bedtime as  needed (pain, sleep).    [provider]  gabapentin (NEURONTIN) 100 MG capsule TAKE 1 CAPSULE BY MOUTH EVERY MORNING 02/23/23   Myrlene Broker, MD  gabapentin (NEURONTIN) 300 MG capsule TAKE 1 CAPSULE BY MOUTH EVERY NIGHT AT BEDTIME 06/06/23   Myrlene Broker, MD  guaiFENesin-dextromethorphan (ROBITUSSIN DM) 100-10 MG/5ML syrup Take 5 mLs by mouth every 4 (four) hours as needed for cough. 10/30/21   Corwin Levins, MD  hydrocortisone 2.5 % ointment Apply topically. 12/02/21   [provider]  ketoconazole (NIZORAL) 2 % shampoo APPLY TO AFFECTED AREA(S) EVERY FRIDAY 02/23/23   Myrlene Broker, MD  levothyroxine (SYNTHROID) 50 MCG tablet TAKE 1 TABLET BY MOUTH DAILY BEFORE BREAKFAST 04/11/23   Myrlene Broker, MD  Lidocaine 4 % PTCH Apply 1 patch topically daily.    [provider]  losartan (COZAAR) 50 MG tablet Take 1 tablet (50 mg total) by mouth daily. 08/23/22   Myrlene Broker, MD  Multiple Vitamins-Minerals (PRESERVISION AREDS 2) CAPS Take 1 capsule by mouth daily.    [provider]  MYRBETRIQ 50 MG TB24 tablet TAKE 1 TABLET BY MOUTH DAILY **APPOINTMENT DUE FOR REFILLS** 02/23/23   Myrlene Broker, MD  omeprazole (PRILOSEC) 20 MG capsule TAKE 1 CAPSULE BY MOUTH DAILY 06/06/23   Myrlene Broker, MD  Polyethyl Glycol-Propyl Glycol 0.4-0.3 % SOLN Apply 1 drop to eye  2 (two) times daily as needed (dry eyes).    [provider]  POTASSIUM PO Take 1 tablet by mouth daily.    [provider]  pravastatin (PRAVACHOL) 20 MG tablet TAKE 1 TABLET BY MOUTH DAILY 03/02/23   Myrlene Broker, MD  VITAMIN E PO Take 1 capsule by mouth daily.    [provider]      Allergies    Sulfonamide derivatives    Review of Systems   Review of Systems  Reason unable to perform ROS: Confusion.  All other systems reviewed and are negative.   Physical Exam Updated Vital Signs BP 135/66   Pulse 68   Temp  98.8 F (37.1 C) (Oral)   Resp (!) 21   SpO2 98%  Physical Exam Vitals and nursing note reviewed.   Gen: NAD Eyes: PERRL, EOMI HEENT: no oropharyngeal swelling Neck: trachea midline Resp: clear to auscultation bilaterally Card: RRR, no murmurs, rubs, or gallops Abd: nontender, nondistended Extremities: no calf tenderness, no edema Vascular: 2+ radial pulses bilaterally, 2+ DP pulses bilaterally MSK: The patient is tender over the mid lumbar spine with no step-offs or deformities Neuro: Cranial nerves intact, equal strength sensation throughout bilateral upper and lower extremities Skin: no rashes Psyc: acting appropriately   ED Results / Procedures / Treatments   Labs (all labs ordered are listed, but only abnormal results are displayed) Labs Reviewed  RESP PANEL BY RT-PCR (RSV, FLU A&B, COVID)  [id number] - Abnormal; Notable for the following components:      Result Value   SARS Coronavirus 2 by RT PCR POSITIVE (*)    All other components within normal limits  CBC WITH DIFFERENTIAL/PLATELET - Abnormal; Notable for the following components:   Hemoglobin 11.9 (*)    Monocytes Absolute 1.1 (*)    All other components within normal limits  URINALYSIS, ROUTINE W REFLEX MICROSCOPIC - Abnormal; Notable for the following components:   Leukocytes,Ua TRACE (*)    Bacteria, UA RARE (*)    All other components within normal limits  COMPREHENSIVE METABOLIC PANEL - Abnormal; Notable for the following components:   Creatinine, Ser 1.07 (*)    Alkaline Phosphatase 37 (*)    GFR, Estimated 48 (*)    All other components within normal limits  AMMONIA    EKG EKG Interpretation Date/Time:  Sunday June 12 2023 20:41:23 EST Ventricular Rate:  94 PR Interval:  207 QRS Duration:  162 QT Interval:  416 QTC Calculation: 521 R Axis:   -46  Text Interpretation: Sinus tachycardia Multiple ventricular premature complexes Borderline prolonged PR interval Left bundle branch block Confirmed by  Beckey Downing [phone]) on 06/12/2023 11:09:51 PM  Radiology No results found.  Procedures Procedures    Medications Ordered in ED Medications - No data to display  ED Course/ Medical Decision Making/ A&P                                 Medical Decision Making 89 year old female with past medical history of hypertension and hypothyroidism presenting to the emergency department today with altered mental status and a fall earlier.  I will further evaluate her here with a CT scan of her head and cervical spine as well as CT scan of her lumbar spine for evaluation for acute traumatic injuries.  Will obtain basic labs to eval for electrolyte abnormalities or anemia.  Given the patient's cough and confusion will obtain a  COVID and flu swab as these are endemic to the area currently and this could be causing some delirium.  I will reevaluate for ultimate disposition.  The patient's labs are largely unremarkable with exception of her COVID swab which is positive.  Her CT imaging studies are pending at the time of signout.  The plan is for admission for delirium secondary to COVID-19 and generalized weakness.    Amount and/or Complexity of Data Reviewed Labs: ordered. Radiology: ordered.           Final Clinical Impression(s) / ED Diagnoses Final diagnoses:  Delirium  COVID-19    Rx / DC Orders ED Discharge Orders     None         Durwin Glaze, MD 06/12/23 2323

## 2023-06-12 NOTE — ED Triage Notes (Signed)
Brought by daughters after fall.about 7:30pm Unwitnessed. Alert button called emergency services.  Patient states she was trying to sit down and missed the chair. Denies hitting head or loc.  C/o pain in bottom  Per family confusion is getting worse daily.

## 2023-06-13 DIAGNOSIS — R4182 Altered mental status, unspecified: Secondary | ICD-10-CM | POA: Diagnosis present

## 2023-06-13 DIAGNOSIS — N1832 Chronic kidney disease, stage 3b: Secondary | ICD-10-CM | POA: Diagnosis not present

## 2023-06-13 DIAGNOSIS — Z79899 Other long term (current) drug therapy: Secondary | ICD-10-CM | POA: Diagnosis not present

## 2023-06-13 DIAGNOSIS — U071 COVID-19: Secondary | ICD-10-CM | POA: Diagnosis present

## 2023-06-13 DIAGNOSIS — I129 Hypertensive chronic kidney disease with stage 1 through stage 4 chronic kidney disease, or unspecified chronic kidney disease: Secondary | ICD-10-CM | POA: Diagnosis not present

## 2023-06-13 DIAGNOSIS — E039 Hypothyroidism, unspecified: Secondary | ICD-10-CM | POA: Diagnosis not present

## 2023-06-13 LAB — BASIC METABOLIC PANEL
Anion gap: 8 (ref 5–15)
BUN: 19 mg/dL (ref 8–23)
CO2: 26 mmol/L (ref 22–32)
Calcium: 8.9 mg/dL (ref 8.9–10.3)
Chloride: 100 mmol/L (ref 98–111)
Creatinine, Ser: 0.94 mg/dL (ref 0.44–1.00)
GFR, Estimated: 56 mL/min — ABNORMAL LOW (ref >60)
Glucose, Bld: 89 mg/dL (ref 70–99)
Potassium: 3.9 mmol/L (ref 3.5–5.1)
Sodium: 134 mmol/L — ABNORMAL LOW (ref 135–145)

## 2023-06-13 LAB — CBC
HCT: 38.7 % (ref 36.0–46.0)
Hemoglobin: 12 g/dL (ref 12.0–15.0)
MCH: 30.5 pg (ref 26.0–34.0)
MCHC: 31 g/dL (ref 30.0–36.0)
MCV: 98.2 fL (ref 80.0–100.0)
Platelets: 150 10*3/uL (ref 150–400)
RBC: 3.94 MIL/uL (ref 3.87–5.11)
RDW: 15.2 % (ref 11.5–15.5)
WBC: 6.6 10*3/uL (ref 4.0–10.5)
nRBC: 0 % (ref 0.0–0.2)

## 2023-06-13 LAB — MAGNESIUM: Magnesium: 2.1 mg/dL (ref 1.7–2.4)

## 2023-06-13 LAB — PHOSPHORUS: Phosphorus: 3.1 mg/dL (ref 2.5–4.6)

## 2023-06-13 MED ORDER — ENOXAPARIN SODIUM 40 MG/0.4ML IJ SOSY
40.0000 mg | PREFILLED_SYRINGE | INTRAMUSCULAR | Status: DC
  Administered 2023-06-13 – 2023-06-15 (×3): 40 mg via SUBCUTANEOUS
  Filled 2023-06-13 (×3): qty 0.4

## 2023-06-13 MED ORDER — ACETAMINOPHEN 325 MG PO TABS: 650.0000 mg | ORAL_TABLET | Freq: Four times a day (QID) | ORAL | Status: DC | PRN

## 2023-06-13 MED ORDER — PROCHLORPERAZINE EDISYLATE 10 MG/2ML IJ SOLN: 5.0000 mg | Freq: Four times a day (QID) | INTRAMUSCULAR | Status: DC | PRN

## 2023-06-13 MED ORDER — MELATONIN 5 MG PO TABS
5.0000 mg | ORAL_TABLET | Freq: Every evening | ORAL | Status: DC | PRN
  Administered 2023-06-14 (×2): 5 mg via ORAL
  Filled 2023-06-13 (×2): qty 1

## 2023-06-13 MED ORDER — POLYETHYLENE GLYCOL 3350 17 G PO PACK: 17.0000 g | PACK | Freq: Every day | ORAL | Status: DC | PRN

## 2023-06-13 MED ORDER — LOSARTAN POTASSIUM 50 MG PO TABS
50.0000 mg | ORAL_TABLET | Freq: Every day | ORAL | Status: DC
  Administered 2023-06-13 – 2023-06-15 (×3): 50 mg via ORAL
  Filled 2023-06-13 (×3): qty 1

## 2023-06-13 MED ORDER — LEVOTHYROXINE SODIUM 50 MCG PO TABS
50.0000 ug | ORAL_TABLET | Freq: Every day | ORAL | Status: DC
  Administered 2023-06-13 – 2023-06-15 (×3): 50 ug via ORAL
  Filled 2023-06-13 (×3): qty 1

## 2023-06-13 MED ORDER — PRAVASTATIN SODIUM 20 MG PO TABS
20.0000 mg | ORAL_TABLET | Freq: Every day | ORAL | Status: DC
  Administered 2023-06-13 – 2023-06-15 (×3): 20 mg via ORAL
  Filled 2023-06-13 (×3): qty 1

## 2023-06-13 MED ORDER — PANTOPRAZOLE SODIUM 40 MG PO TBEC
40.0000 mg | DELAYED_RELEASE_TABLET | Freq: Every day | ORAL | Status: DC
  Administered 2023-06-13 – 2023-06-15 (×3): 40 mg via ORAL
  Filled 2023-06-13 (×3): qty 1

## 2023-06-13 NOTE — H&P (Signed)
History and Physical  Deanna Olson OZH:[id number] DOB: 03-06-1926 DOA: 06/12/2023  Referring physician: Accepted by Dr. Joneen Roach [facility], hospitalist service. PCP: Myrlene Broker, MD  Outpatient Specialists: Cardiology. Patient coming from: Lives at home independently.  Chief Complaint: Altered mental status.  HPI: Deanna Olson is a 89 y.o. female with medical history significant for moderate aortic stenosis, LBBB, hypertension, hypothyroidism, CKD 3B, recently seen in the ER on April 22, 2023 after a fall (no fracture), who initially presented to [facility] ED after a fall and altered mental status.  Per the patient she lost her footing and fell.  Endorses a nonproductive cough over a few days duration.  Her family found her on the floor.  EMS was activated and she was brought into the ED for further evaluation.  In the ED, mildly tachypneic with altered mental status, COVID-19 screening test positive.  Noncontrast CT head, cervical spine, and lumbar spine were nonacute.  Due to concern for COVID delirium, EDP requested admission.  Accepted by Dr. Joneen Roach and transferred to [facility] telemetry unit as observation status.  At the time of visit the patient is alert and oriented x 3.  She has no new complaints at this time.  PT OT will evaluate.  Fall precautions are in place.  ED Course: Temperature 98.9.  BP 134/74, pulse 60, respiratory 16, saturation 97% on room air.  Lab studies notable for creatinine 1.07 with GFR 48 close to baseline.  CBC essentially unremarkable.  Review of Systems: Review of systems as noted in the HPI. All other systems reviewed and are negative.   Past Medical History:  Diagnosis Date   Allergic rhinitis, cause unspecified    Anxiety state, unspecified    Aortic valve disorders    Mild AS 2013 echo    Eczema    Esophageal reflux    Herpes zoster without mention of complication    Hyperlipidemia    Hypertension    Hypothyroid     Osteoarthrosis, unspecified whether generalized or localized, unspecified site    Unspecified venous (peripheral) insufficiency    Past Surgical History:  Procedure Laterality Date   CATARACT EXTRACTION, BILATERAL  2013   KYPHOPLASTY  02/2018   VESICOVAGINAL FISTULA CLOSURE W/ TAH      Social History:  reports that she has never smoked. She has never used smokeless tobacco. She reports that she does not drink alcohol and does not use drugs.   Allergies  Allergen Reactions   Sulfonamide Derivatives Swelling    Angioedema     Family History  Problem Relation Age of Onset   Stroke Mother 13   Diabetes Mother    Diabetes Brother    Heart attack Brother 95       s/p open heart surg   CAD Brother 4   Cancer Sister        4 types      Prior to Admission medications   Medication Sig Start Date End Date Taking? Authorizing Provider  acetaminophen (TYLENOL) 500 MG tablet Take 500 mg by mouth daily as needed for moderate pain.    [provider]  ALPRAZolam Prudy Feeler) 0.5 MG tablet Take 1 tablet (0.5 mg total) by mouth 2 (two) times daily. 01/10/23   Myrlene Broker, MD  Apoaequorin (PREVAGEN PO) Take 1 capsule by mouth daily.    [provider]  Ascorbic Acid (VITAMIN C PO) Take 1 tablet by mouth daily.    [provider]  b complex vitamins capsule Take  1 capsule by mouth daily.    [provider]  betamethasone dipropionate 0.05 % lotion APPLY TOPICALLY DAILY AS NEEDED USES ON SCALP AND EARS 12/22/22   Myrlene Broker, MD  Bioflavonoid Products (BIOFLEX) TABS Take 1 tablet by mouth daily.    [provider]  Cholecalciferol (VITAMIN D-3 PO) Take 1 capsule by mouth daily.    [provider]  diphenhydramine-acetaminophen (TYLENOL PM) 25-500 MG TABS tablet Take 1 tablet by mouth at bedtime as needed (pain, sleep).    [provider]  gabapentin (NEURONTIN) 100 MG capsule TAKE 1 CAPSULE BY MOUTH EVERY MORNING  02/23/23   Myrlene Broker, MD  gabapentin (NEURONTIN) 300 MG capsule TAKE 1 CAPSULE BY MOUTH EVERY NIGHT AT BEDTIME 06/06/23   Myrlene Broker, MD  guaiFENesin-dextromethorphan (ROBITUSSIN DM) 100-10 MG/5ML syrup Take 5 mLs by mouth every 4 (four) hours as needed for cough. 10/30/21   Corwin Levins, MD  hydrocortisone 2.5 % ointment Apply topically. 12/02/21   [provider]  ketoconazole (NIZORAL) 2 % shampoo APPLY TO AFFECTED AREA(S) EVERY FRIDAY 02/23/23   Myrlene Broker, MD  levothyroxine (SYNTHROID) 50 MCG tablet TAKE 1 TABLET BY MOUTH DAILY BEFORE BREAKFAST 04/11/23   Myrlene Broker, MD  Lidocaine 4 % PTCH Apply 1 patch topically daily.    [provider]  losartan (COZAAR) 50 MG tablet Take 1 tablet (50 mg total) by mouth daily. 08/23/22   Myrlene Broker, MD  Multiple Vitamins-Minerals (PRESERVISION AREDS 2) CAPS Take 1 capsule by mouth daily.    [provider]  MYRBETRIQ 50 MG TB24 tablet TAKE 1 TABLET BY MOUTH DAILY **APPOINTMENT DUE FOR REFILLS** 02/23/23   Myrlene Broker, MD  omeprazole (PRILOSEC) 20 MG capsule TAKE 1 CAPSULE BY MOUTH DAILY 06/06/23   Myrlene Broker, MD  Polyethyl Glycol-Propyl Glycol 0.4-0.3 % SOLN Apply 1 drop to eye 2 (two) times daily as needed (dry eyes).    [provider]  POTASSIUM PO Take 1 tablet by mouth daily.    [provider]  pravastatin (PRAVACHOL) 20 MG tablet TAKE 1 TABLET BY MOUTH DAILY 03/02/23   Myrlene Broker, MD  VITAMIN E PO Take 1 capsule by mouth daily.    [provider]    Physical Exam: BP (!) 162/64 (BP Location: Left Arm)   Pulse 61   Temp 98.9 F (37.2 C) (Oral)   Resp 18   SpO2 99%   General: 89 y.o. year-old female well developed well nourished in no acute distress.  Alert and oriented x3. Cardiovascular: Regular rate and rhythm with no rubs or gallops.  No thyromegaly or JVD noted.  No lower extremity edema. 2/4 pulses in all  4 extremities. Respiratory: Clear to auscultation with no wheezes or rales. Good inspiratory effort. Abdomen: Soft nontender nondistended with normal bowel sounds x4 quadrants. Muskuloskeletal: No cyanosis, clubbing or edema noted bilaterally Neuro: CN II-XII intact, strength, sensation, reflexes Skin: No ulcerative lesions noted or rashes Psychiatry: Judgement and insight appear normal. Mood is appropriate for condition and setting          Labs on Admission:  Basic Metabolic Panel: Recent Labs  Lab 06/12/23 2240  NA 136  K 4.2  CL 100  CO2 28  GLUCOSE 92  BUN 23  CREATININE 1.07*  CALCIUM 9.2   Liver Function Tests: Recent Labs  Lab 06/12/23 2240  AST 22  ALT 13  ALKPHOS 37*  BILITOT 0.4  PROT 7.3  ALBUMIN 4.2   No results for input(s): "LIPASE", "AMYLASE" in the last 168 hours. Recent Labs  Lab 06/12/23 2240  AMMONIA 17   CBC: Recent Labs  Lab 06/12/23 2124  WBC 7.1  NEUTROABS 4.4  HGB 11.9*  HCT 36.3  MCV 92.8  PLT 163   Cardiac Enzymes: No results for input(s): "CKTOTAL", "CKMB", "CKMBINDEX", "TROPONINI" in the last 168 hours.  BNP (last 3 results) No results for input(s): "BNP" in the last 8760 hours.  ProBNP (last 3 results) No results for input(s): "PROBNP" in the last 8760 hours.  CBG: No results for input(s): "GLUCAP" in the last 168 hours.  Radiological Exams on Admission: CT Lumbar Spine Wo Contrast Result Date: 06/12/2023 CLINICAL DATA:  Fall EXAM: CT LUMBAR SPINE WITHOUT CONTRAST TECHNIQUE: Multidetector CT imaging of the lumbar spine was performed without intravenous contrast administration. Multiplanar CT image reconstructions were also generated. RADIATION DOSE REDUCTION: This exam was performed according to the departmental dose-optimization program which includes automated exposure control, adjustment of the mA and/or kV according to patient size and/or use of iterative reconstruction technique. COMPARISON:  None Available.  FINDINGS: Segmentation: 5 lumbar type vertebrae. Alignment: Levoscoliosis with apex at L3. Grade 1 anterolisthesis at L5-S1. Vertebrae: Chronic compression deformities of L3 and L4, status post augmentation. No acute fracture. Paraspinal and other soft tissues: Calcific aortic atherosclerosis. Disc levels: No bony spinal canal stenosis. No high-grade foraminal stenosis. Multilevel facet arthrosis. IMPRESSION: 1. No acute fracture or static subluxation of the lumbar spine. 2. Chronic compression deformities of L3 and L4, status post augmentation. Aortic Atherosclerosis (ICD10-I70.0). Electronically Signed   By: Deatra Robinson M.D.   On: 06/12/2023 22:53   CT Head Wo Contrast Result Date: 06/12/2023 CLINICAL DATA:  Fall EXAM: CT HEAD WITHOUT CONTRAST CT CERVICAL SPINE WITHOUT CONTRAST TECHNIQUE: Multidetector CT imaging of the head and cervical spine was performed following the standard protocol without intravenous contrast. Multiplanar CT image reconstructions of the cervical spine were also generated. RADIATION DOSE REDUCTION: This exam was performed according to the departmental dose-optimization program which includes automated exposure control, adjustment of the mA and/or kV according to patient size and/or use of iterative reconstruction technique. COMPARISON:  04/22/2023 FINDINGS: CT HEAD FINDINGS Brain: There is no mass, hemorrhage or extra-axial collection. There is generalized atrophy without lobar predilection. Hypodensity of the white matter is most commonly associated with chronic microvascular disease. Chronic calcification in the right frontal white matter. Vascular: No hyperdense vessel or unexpected vascular calcification. Skull: The visualized skull base, calvarium and extracranial soft tissues are normal. Sinuses/Orbits: No fluid levels or advanced mucosal thickening of the visualized paranasal sinuses. No mastoid or middle ear effusion. Normal orbits. Other: None. CT CERVICAL SPINE FINDINGS  Alignment: No static subluxation. Facets are aligned. Occipital condyles are normally positioned. Skull base and vertebrae: No acute fracture. Soft tissues and spinal canal: No prevertebral fluid or swelling. No visible canal hematoma. Disc levels: No advanced spinal canal or neural foraminal stenosis. Upper chest: No pneumothorax, pulmonary nodule or pleural effusion. Other: Normal visualized paraspinal cervical soft tissues. IMPRESSION: 1. No acute intracranial abnormality. 2. No acute fracture or static subluxation of the cervical spine. Electronically Signed   By: Deatra Robinson M.D.   On: 06/12/2023 22:38   CT Cervical Spine Wo Contrast Result Date: 06/12/2023 CLINICAL DATA:  Fall EXAM: CT HEAD WITHOUT CONTRAST CT CERVICAL SPINE WITHOUT CONTRAST TECHNIQUE: Multidetector CT imaging of the head and cervical spine was performed following the standard protocol without intravenous contrast. Multiplanar CT  image reconstructions of the cervical spine were also generated. RADIATION DOSE REDUCTION: This exam was performed according to the departmental dose-optimization program which includes automated exposure control, adjustment of the mA and/or kV according to patient size and/or use of iterative reconstruction technique. COMPARISON:  04/22/2023 FINDINGS: CT HEAD FINDINGS Brain: There is no mass, hemorrhage or extra-axial collection. There is generalized atrophy without lobar predilection. Hypodensity of the white matter is most commonly associated with chronic microvascular disease. Chronic calcification in the right frontal white matter. Vascular: No hyperdense vessel or unexpected vascular calcification. Skull: The visualized skull base, calvarium and extracranial soft tissues are normal. Sinuses/Orbits: No fluid levels or advanced mucosal thickening of the visualized paranasal sinuses. No mastoid or middle ear effusion. Normal orbits. Other: None. CT CERVICAL SPINE FINDINGS Alignment: No static subluxation.  Facets are aligned. Occipital condyles are normally positioned. Skull base and vertebrae: No acute fracture. Soft tissues and spinal canal: No prevertebral fluid or swelling. No visible canal hematoma. Disc levels: No advanced spinal canal or neural foraminal stenosis. Upper chest: No pneumothorax, pulmonary nodule or pleural effusion. Other: Normal visualized paraspinal cervical soft tissues. IMPRESSION: 1. No acute intracranial abnormality. 2. No acute fracture or static subluxation of the cervical spine. Electronically Signed   By: Deatra Robinson M.D.   On: 06/12/2023 22:38   DG Chest Portable 1 View Result Date: 06/12/2023 CLINICAL DATA:  Fall EXAM: PORTABLE CHEST 1 VIEW COMPARISON:  CT chest 04/28/2022, chest x-ray 02/01/2022 FINDINGS: The heart and mediastinal contours are within normal limits. Atherosclerotic plaque. Biapical pleural/pulmonary scarring. No focal consolidation. Chronic coarsened interstitial markings with no overt pulmonary edema. No pleural effusion. No pneumothorax. No acute osseous abnormality. IMPRESSION: 1. No active disease. 2.  Aortic Atherosclerosis (ICD10-I70.0). Electronically Signed   By: Tish Frederickson M.D.   On: 06/12/2023 22:16    EKG: I independently viewed the EKG done and my findings are as followed: Sinus tachycardia rate of 94.  Borderline QTc prolongation 521.  Assessment/Plan Present on Admission:  COVID  Principal Problem:   COVID  COVID-19 viral infection with delirium, improved After the patient arrived at [facility], she is alert and oriented x 3. Reorient as needed if delirium recurs. Nonpharmacological approach to delirium, regulate sleep and wake cycle. PT OT evaluation Fall precautions. COVID-19 screening test positive on 06/12/2023 Appears to have been symptomatic for a few days, not hypoxic, was not started on antivirals prior to transfer. Continue to monitor.  Hypertension Resume home oral antihypertensives Continue to  closely monitor vital signs.  Hypothyroidism Resume home levothyroxine  History of falls CT head, CT cervical spine, CT lumbar spine nonacute. PT OT evaluation Fall precautions.  Hyperlipidemia Resume home pravastatin  GERD Resume home PPI  History of anxiety Unclear if she was on Xanax prior to admission-no family members at bedside to provide additional information.  History of polyneuropathy Hold off home gabapentin for now due to falls.   Time: 75 minutes.   DVT prophylaxis: Subcu Lovenox daily.  Code Status: Full code.  Family Communication: None at bedside.  Disposition Plan: Admitted to telemetry unit.  Consults called: PT/OT.  Admission status: Observation status.   Status is: Observation    Darlin Drop MD [facility] Pager [phone]  If 7PM-7AM, please contact night-coverage [email or web address] Password [facility]  06/13/2023, 3:34 AM

## 2023-06-13 NOTE — TOC Progression Note (Signed)
Transition of Care [facility]) - Progression Note    Patient Details  Name: Deanna Olson MRN: [id number] Date of Birth: 03/03/1926  [facility] CM/SW Contact  [name] Phone Number: 06/13/2023, 3:42 PM  Clinical Narrative:     CSW spoke to patient's daughter Deanna Olson.  Per patient's daughter they are hoping patient can go to SNF for short term rehab.  CSW explained the process, and will work on SNF placement.  Formal assessment to follow.       Expected Discharge Plan and Services                                               Social Determinants of Health (SDOH) Interventions SDOH Screenings   Food Insecurity: No Food Insecurity (06/13/2023)  Housing: Low Risk  (06/13/2023)  Transportation Needs: No Transportation Needs (06/13/2023)  Utilities: Not At Risk (06/13/2023)  Alcohol Screen: Low Risk  (02/13/2021)  Depression (PHQ2-9): Low Risk  (12/24/2022)  Financial Resource Strain: Low Risk  (12/24/2022)  Physical Activity: Inactive (12/24/2022)  Social Connections: Moderately Integrated (12/24/2022)  Stress: No Stress Concern Present (12/24/2022)  Tobacco Use: Low Risk  (06/12/2023)  Health Literacy: Adequate Health Literacy (12/24/2022)    Readmission Risk Interventions     No data to display

## 2023-06-13 NOTE — ED Notes (Signed)
Attempted to collect d dimer twice. Unsuccessful

## 2023-06-13 NOTE — Plan of Care (Signed)

## 2023-06-13 NOTE — Progress Notes (Signed)
Subjective: Patient admitted this morning, see detailed H&P by Dr Margo Aye 89 y.o. female with medical history significant for moderate aortic stenosis, LBBB, hypertension, hypothyroidism, CKD 3B, recently seen in the ER on April 22, 2023 after a fall (no fracture), who initially presented to [facility] ED after a fall and altered mental status.  Per the patient she lost her footing and fell.  Endorses a nonproductive cough over a few days duration   the ED, mildly tachypneic with altered mental status, COVID-19 screening test positive.  Noncontrast CT head, cervical spine, and lumbar spine were nonacute.  Due to concern for COVID delirium, EDP requested admission.   Vitals:   06/13/23 1053 06/13/23 1256  BP: (!) 133/48 (!) 147/58  Pulse: 73 (!) 53  Resp:    Temp: 98.3 F (36.8 C) 97.9 F (36.6 C)  SpO2: 96% 96%      A/P  COVID-19 viral infection with delirium, improved After the patient arrived at [facility], she is alert and oriented x 3. Reorient as needed if delirium recurs. Nonpharmacological approach to delirium, regulate sleep and wake cycle. PT OT evaluation Fall precautions. COVID-19 screening test positive on 06/12/2023 Appears to have been symptomatic for a few days, not hypoxic, was not started on antivirals prior to transfer. Continue to monitor.   Hypertension Resume home oral antihypertensives Continue to closely monitor vital signs.   Hypothyroidism Resume home levothyroxine   History of falls CT head, CT cervical spine, CT lumbar spine nonacute. PT OT evaluation Fall precautions.   Hyperlipidemia Resume home pravastatin   GERD Resume home PPI   History of anxiety Unclear if she was on Xanax prior to admission-no family members at bedside to provide additional information.   History of polyneuropathy Hold off home gabapentin for now due to falls.   Meredeth Ide [facility]

## 2023-06-13 NOTE — Progress Notes (Signed)
Mobility Specialist - Progress Note   06/13/23 1122  Mobility  Activity Ambulated with assistance in room  Level of Assistance Standby assist, set-up cues, supervision of patient - no hands on  Assistive Device Front wheel walker  Distance Ambulated (ft) 80 ft  Activity Response Tolerated well  Mobility Referral Yes  Mobility visit 1 Mobility  Mobility Specialist Start Time (ACUTE ONLY) 1110  Mobility Specialist Stop Time (ACUTE ONLY) 1121  Mobility Specialist Time Calculation (min) (ACUTE ONLY) 11 min   Pt received in bed and agreeable to mobility. Pt was minA from STS. No complaints during session. Pt to recliner after session with all needs met.    [facility]

## 2023-06-13 NOTE — Evaluation (Signed)
Physical Therapy Evaluation Patient Details Name: Deanna Olson MRN: [id number] DOB: [birth year] Today's Date: 06/13/2023  History of Present Illness  Pt is 89 yo female admitted observation on 06/12/23 with fall at home, COVID +.  All imaging negative for acute changes.  Pt with hx including but not limited to aortic stenosis, LBBB, HTN, hypothyroidism, CKD  Clinical Impression  Pt admitted with above diagnosis. At baseline, pt lives alone with daughters checking on her daily.  She is normally ambulatory with RW and performs ADLs but family assist IADLs.  She has had 2 falls recently and family expressed concern about being home alone/possible need for long term care largely due to cognition.  Today, pt transferring and ambulated 250' with RW at supervision/CGA level.  Her VSS and she denied shortness of breath.   Family reports pt appears near baseline mobility.  Will maintain on caseload to advance/maintain mobility while hospitalized. Pt currently with functional limitations due to the deficits listed below (see PT Problem List). Pt will benefit from acute skilled PT to increase their independence and safety with mobility to allow discharge.  Would recommend family supervision and Could benefit from HHPT initially to help with home safety/continuing to improve mobility.  Could also potentially benefit from ALF in near future.          If plan is discharge home, recommend the following: A little help with walking and/or transfers;A little help with bathing/dressing/bathroom;Assistance with cooking/housework;Help with stairs or ramp for entrance;Direct supervision/assist for financial management;Direct supervision/assist for medications management   Can travel by private vehicle        Equipment Recommendations None recommended by PT  Recommendations for Other Services       Functional Status Assessment Patient has had a recent decline in their functional status and demonstrates the ability  to make significant improvements in function in a reasonable and predictable amount of time.     Precautions / Restrictions Precautions Precautions: Fall      Mobility  Bed Mobility Overal bed mobility: Needs Assistance Bed Mobility: Supine to Sit     Supine to sit: Supervision          Transfers Overall transfer level: Needs assistance Equipment used: Rolling walker (2 wheels) Transfers: Sit to/from Stand Sit to Stand: Contact guard assist           General transfer comment: STS x 3 with CGA for safety; performed toileting ADLs with superivison    Ambulation/Gait Ambulation/Gait assistance: Contact guard assist Gait Distance (Feet): 250 Feet Assistive device: Rolling walker (2 wheels) Gait Pattern/deviations: Step-through pattern Gait velocity: normal     General Gait Details: min cues for RW proximity; steady gait without LOB; denies SOB  Stairs            Wheelchair Mobility     Tilt Bed    Modified Rankin (Stroke Patients Only)       Balance Overall balance assessment: Needs assistance, History of Falls Sitting-balance support: No upper extremity supported Sitting balance-Leahy Scale: Good     Standing balance support: No upper extremity supported, Bilateral upper extremity supported Standing balance-Leahy Scale: Fair Standing balance comment: RW to ambulate but could static stand without support                             Pertinent Vitals/Pain Pain Assessment Pain Assessment: No/denies pain    Home Living Family/patient expects to be discharged to:: Private residence Living Arrangements: Alone  Available Help at Discharge: Family;Available PRN/intermittently (3 daughters within checking on daily) Type of Home: House Home Access: Stairs to enter   [employer] of Steps: just threshold   Home Layout: One level;Other (Comment) (2 steps to suken living room) Home Equipment: Agricultural consultant (2  wheels);Rollator (4 wheels);Cane - single point;Grab bars - tub/shower      Prior Function               Mobility Comments: Normally ambulates with RW; could ambulate in community but fatigued easily; 2 falls last 6 months from LOB ADLs Comments: Daughter's drive; Pt independent with ADLs; Family assist with IADLs     Extremity/Trunk Assessment   Upper Extremity Assessment Upper Extremity Assessment: Overall WFL for tasks assessed    Lower Extremity Assessment Lower Extremity Assessment: Overall WFL for tasks assessed    Cervical / Trunk Assessment Cervical / Trunk Assessment: Normal  Communication   Communication Communication: Hearing impairment  Cognition Arousal: Alert Behavior During Therapy: WFL for tasks assessed/performed Overall Cognitive Status: History of cognitive impairments - at baseline                                 General Comments: Pt does not have dx of dementia but family reports poor safety awareness and memory at baseline.        General Comments General comments (skin integrity, edema, etc.): Pt on RA wtih sats >94%.  HR 69 bpm with ambulation.  2 daughters present    Exercises     Assessment/Plan    PT Assessment Patient needs continued PT services  PT Problem List Decreased strength;Cardiopulmonary status limiting activity;Decreased range of motion;Decreased cognition;Decreased activity tolerance;Decreased balance;Decreased mobility;Decreased safety awareness;Decreased knowledge of use of DME       PT Treatment Interventions DME instruction;Therapeutic exercise;Gait training;Balance training;Stair training;Functional mobility training;Therapeutic activities;Patient/family education;Cognitive remediation    PT Goals (Current goals can be found in the Care Plan section)  Acute Rehab PT Goals Patient Stated Goal: Family expressed concern about memory/cognition - safety at home at baseline -reports they are considering options  for long term placement NH/ALF PT Goal Formulation: With patient/family Time For Goal Achievement: 06/27/23 Potential to Achieve Goals: Good    Frequency Min 1X/week     Co-evaluation               AM-PAC PT "6 Clicks" Mobility  Outcome Measure Help needed turning from your back to your side while in a flat bed without using bedrails?: None Help needed moving from lying on your back to sitting on the side of a flat bed without using bedrails?: A Little Help needed moving to and from a bed to a chair (including a wheelchair)?: A Little Help needed standing up from a chair using your arms (e.g., wheelchair or bedside chair)?: A Little Help needed to walk in hospital room?: A Little Help needed climbing 3-5 steps with a railing? : A Little 6 Click Score: 19    End of Session Equipment Utilized During Treatment: Gait belt Activity Tolerance: Patient tolerated treatment well Patient left: in chair;with call bell/phone within reach;with family/visitor present (family assist pt to bathroom, no alarm on chair, family will notify nurse if they leave) Nurse Communication: Mobility status PT Visit Diagnosis: Other abnormalities of gait and mobility (R26.89);History of falling (Z91.81)    Time: 1457-1530 PT Time Calculation (min) (ACUTE ONLY): 33 min   Charges:   PT  Evaluation $PT Eval Low Complexity: 1 Low PT Treatments $Therapeutic Activity: 8-22 mins PT General Charges $$ ACUTE PT VISIT: 1 Visit         Anise Salvo, PT Acute Rehab [facility] [phone]   Rayetta Humphrey 06/13/2023, 3:40 PM

## 2023-06-13 NOTE — Consult Note (Signed)
WOC Nurse Consult Note: Reason for Consult: Intertriginous dermatitis beneath breasts and lower abdomen.  Patient states has been present x 3 weeks and she has been applying a cream. She is unclear on what cream.  Wound type: fungal overgrowth beneath pannus and inframammary region, present on admission.  Pressure Injury POA: NA Measurement: generalized erythematous rash beneath breasts and abdominal skin fold.  Wound bed: red and moist, weeping Drainage (amount, consistency, odor) scant weeping Periwound:intact Dressing procedure/placement/frequency:Will implement Interdry skin fold management  Order [facility] # [phone] Measure and cut length of InterDry to fit in skin folds that have skin breakdown Tuck InterDry fabric into skin folds in a single layer, allow for 2 inches of overhang from skin edges to allow for wicking to occur May remove to bathe; dry area thoroughly and then tuck into affected areas again Do not apply any creams or ointments when using InterDry DO NOT THROW AWAY FOR 5 DAYS unless soiled with stool DO NOT [facility] product, this will inactivate the silver in the material  New sheet of Interdry should be applied after 5 days of use if patient continues to have skin breakdown    Will not follow at this time.  Please re-consult if needed.  Mike Gip MSN, RN, FNP-BC CWON Wound, Ostomy, Continence Nurse Outpatient [facility] [phone] Pager [phone]

## 2023-06-13 NOTE — Care Management Obs Status (Signed)
MEDICARE OBSERVATION STATUS NOTIFICATION   Patient Details  Name: Deanna Olson MRN: [id number] Date of Birth: 11/06/1925   Medicare Observation Status Notification Given:  Yes    [name] 06/13/2023, 3:42 PM

## 2023-06-14 DIAGNOSIS — U071 COVID-19: Secondary | ICD-10-CM | POA: Diagnosis not present

## 2023-06-14 MED ORDER — ALPRAZOLAM 0.5 MG PO TABS
0.5000 mg | ORAL_TABLET | Freq: Every evening | ORAL | Status: DC | PRN
  Administered 2023-06-14 – 2023-06-15 (×2): 0.5 mg via ORAL
  Filled 2023-06-14 (×2): qty 1

## 2023-06-14 MED ORDER — HYDRALAZINE HCL 20 MG/ML IJ SOLN: 5.0000 mg | Freq: Once | INTRAMUSCULAR | Status: DC | PRN

## 2023-06-14 NOTE — Progress Notes (Signed)
 Physical Therapy Treatment Patient Details Name: Deanna Olson MRN: [id number] DOB: 03/09/1926 Today's Date: 06/14/2023   History of Present Illness Patient is 89 yo female admitted observation on 06/12/23 with fall at home, COVID +.  All imaging negative for acute changes.  Pt with hx including but not limited to aortic stenosis, LBBB, HTN, hypothyroidism, CKD    PT Comments  Pt agreeable to working with therapy. Briefly spoke with RN who reported pt was ok to work with. Pt tolerated activity well. Dyspnea 2/4. O2 96% on RA. Family present during session.    If plan is discharge home, recommend the following: A little help with walking and/or transfers;A little help with bathing/dressing/bathroom;Assistance with cooking/housework;Help with stairs or ramp for entrance;Direct supervision/assist for financial management;Direct supervision/assist for medications management   Can travel by private vehicle        Equipment Recommendations  None recommended by PT    Recommendations for Other Services       Precautions / Restrictions Precautions Precautions: Fall Restrictions Weight Bearing Restrictions Per Provider Order: No     Mobility  Bed Mobility Overal bed mobility: Needs Assistance Bed Mobility: Supine to Sit, Sit to Supine     Supine to sit: Modified independent (Device/Increase time), HOB elevated Sit to supine: Modified independent (Device/Increase time), HOB elevated        Transfers Overall transfer level: Needs assistance Equipment used: Rolling walker (2 wheels) Transfers: Sit to/from Stand Sit to Stand: Supervision           General transfer comment: for safety. Pt pulled up on RW instead of pushing from bed.    Ambulation/Gait Ambulation/Gait assistance: Supervision Gait Distance (Feet): 350 Feet Assistive device: Rolling walker (2 wheels) Gait Pattern/deviations: Step-through pattern       General Gait Details: good gait speed. cues for pacing.  No LOB with RW use. Dyspnea 2/4. O2 96% on RA. Hr 80s-90s bpm. Pt tolerated distance well.   Stairs             Wheelchair Mobility     Tilt Bed    Modified Rankin (Stroke Patients Only)       Balance Overall balance assessment: History of Falls, Needs assistance         Standing balance support: During functional activity Standing balance-Leahy Scale: Fair                              Cognition Arousal: Alert Behavior During Therapy: WFL for tasks assessed/performed Overall Cognitive Status: History of cognitive impairments - at baseline                                 General Comments: Pt does not have dx of dementia but family reports poor safety awareness and memory at baseline.        Exercises      General Comments        Pertinent Vitals/Pain Pain Assessment Pain Assessment: No/denies pain    Home Living Family/patient expects to be discharged to:: Private residence Living Arrangements: Alone Available Help at Discharge: Family;Available PRN/intermittently Type of Home: House Home Access: Stairs to enter   Entrance Stairs-Number of Steps: just threshold   Home Layout: One level Home Equipment: Agricultural Consultant (2 wheels);Rollator (4 wheels);Cane - single point;Grab bars - tub/shower      Prior Function  PT Goals (current goals can now be found in the care plan section) Progress towards PT goals: Progressing toward goals    Frequency    Min 1X/week      PT Plan      Co-evaluation              AM-PAC PT 6 Clicks Mobility   Outcome Measure  Help needed turning from your back to your side while in a flat bed without using bedrails?: None Help needed moving from lying on your back to sitting on the side of a flat bed without using bedrails?: None Help needed moving to and from a bed to a chair (including a wheelchair)?: A Little Help needed standing up from a chair using your arms  (e.g., wheelchair or bedside chair)?: A Little Help needed to walk in hospital room?: A Little Help needed climbing 3-5 steps with a railing? : A Little 6 Click Score: 20    End of Session Equipment Utilized During Treatment: Gait belt Activity Tolerance: Patient tolerated treatment well Patient left: in bed;with call bell/phone within reach;with family/visitor present;with bed alarm set   PT Visit Diagnosis: History of falling (Z91.81)     Time: 8382-8363 PT Time Calculation (min) (ACUTE ONLY): 19 min  Charges:    $Gait Training: 8-22 mins PT General Charges $$ ACUTE PT VISIT: 1 Visit                         Dannial SQUIBB, PT Acute Rehabilitation  Office: [phone]

## 2023-06-14 NOTE — Progress Notes (Signed)
 Mobility Specialist - Progress Note   06/14/23 1217  Mobility  Activity Transferred from bed to chair  Level of Assistance Modified independent, requires aide device or extra time  Assistive Device Front wheel walker  Distance Ambulated (ft) 20 ft  Activity Response Tolerated well  Mobility Referral Yes  Mobility visit 1 Mobility  Mobility Specialist Start Time (ACUTE ONLY) 1209  Mobility Specialist Stop Time (ACUTE ONLY) 1217  Mobility Specialist Time Calculation (min) (ACUTE ONLY) 8 min   Pt received in bed and agreeable to transfer to recliner. No complaints during session. Pt to recliner after session with all needs met.   [facility]

## 2023-06-14 NOTE — Progress Notes (Signed)
 [facility]  PROGRESS NOTE  Deanna Olson FMW:[id number] DOB: 07-06-1925 DOA: 06/12/2023 PCP: Rollene Almarie LABOR, MD   Brief HPI:   89 y.o. female with medical history significant for moderate aortic stenosis, LBBB, hypertension, hypothyroidism, CKD 3B, recently seen in the ER on April 22, 2023 after a fall (no fracture), who initially presented to [facility] ED after a fall and altered mental status.  Per the patient she lost her footing and fell.  Endorses a nonproductive cough over a few days duration   the ED, mildly tachypneic with altered mental status, COVID-19 screening test positive.  Noncontrast CT head, cervical spine, and lumbar spine were nonacute.      Assessment/Plan:   COVID-19 viral infection with delirium, improved After the patient arrived at [facility], she is alert and oriented x 3. Reorient as needed if delirium recurs. Nonpharmacological approach to delirium, regulate sleep and wake cycle. PT OT evaluation obtained, patient to go home with home health PT Fall precautions. COVID-19 screening test positive on 06/12/2023 Appears to have been symptomatic for a few days, not hypoxic, was not started on antivirals prior to transfer. Continue to monitor.   Hypertension Resume home oral antihypertensives Continue to closely monitor vital signs.   Hypothyroidism Resume home levothyroxine    History of falls CT head, CT cervical spine, CT lumbar spine nonacute. PT OT evaluation Fall precautions.   Hyperlipidemia Resume home pravastatin    GERD Resume home PPI   History of anxiety Continue Xanax  as needed   History of polyneuropathy Hold off home gabapentin  for now due to falls.      Medications     enoxaparin  (LOVENOX ) injection  40 mg Subcutaneous Q24H   levothyroxine   50 mcg Oral Q0600   losartan   50 mg Oral Daily   pantoprazole   40 mg Oral Daily   pravastatin   20 mg Oral Daily     Data Reviewed:   CBG:  No results for  input(s): GLUCAP in the last 168 hours.  SpO2: 96 %    Vitals:   06/13/23 1053 06/13/23 1256 06/13/23 2021 06/14/23 0525  BP: (!) 133/48 (!) 147/58 (!) 187/84 (!) 157/74  Pulse: 73 (!) 53 (!) 52 (!) 57  Resp:   20 15  Temp: 98.3 F (36.8 C) 97.9 F (36.6 C) 98.1 F (36.7 C) 98.8 F (37.1 C)  TempSrc: Oral Oral Oral Oral  SpO2: 96% 96% 97% 96%      Data Reviewed:  Basic Metabolic Panel: Recent Labs  Lab 06/12/23 2240 06/13/23 0449  NA 136 134*  K 4.2 3.9  CL 100 100  CO2 28 26  GLUCOSE 92 89  BUN 23 19  CREATININE 1.07* 0.94  CALCIUM  9.2 8.9  MG  --  2.1  PHOS  --  3.1    CBC: Recent Labs  Lab 06/12/23 2124 06/13/23 0449  WBC 7.1 6.6  NEUTROABS 4.4  --   HGB 11.9* 12.0  HCT 36.3 38.7  MCV 92.8 98.2  PLT 163 150    LFT Recent Labs  Lab 06/12/23 2240  AST 22  ALT 13  ALKPHOS 37*  BILITOT 0.4  PROT 7.3  ALBUMIN 4.2     Antibiotics: Anti-infectives (From admission, onward)    None        DVT prophylaxis: Lovenox   Code Status: Full code  Family Communication: Discussed with patient's family at bedside   CONSULTS    Subjective   No new complaints   Objective    Physical  Examination:  General-appears in no acute distress Heart-S1-S2, regular, no murmur auscultated Lungs-clear to auscultation bilaterally, no wheezing or crackles auscultated Abdomen-soft, nontender, no organomegaly Extremities-no edema in the lower extremities Neuro-alert, oriented x3, no focal deficit noted   Status is: Inpatient:             Deanna Olson   [facility] If 7PM-7AM, please contact night-coverage at [email or web address], Office  [phone]   06/14/2023, 10:52 AM  LOS: 0 days

## 2023-06-14 NOTE — [place] FL2 (Signed)
 [place]  MEDICAID [id number] LEVEL OF CARE FORM     IDENTIFICATION  Patient Name: Deanna Olson Birthdate: 1925/09/21 Sex: female Admission Date (Current Location): 06/12/2023  [facility] and [place] Number:  Producer, Television/film/video and Address:  [facility],  [address], [place] [address]      Provider Number: [id number]  Attending Physician Name and Address:  Drusilla Sabas RAMAN, MD  Relative Name and Phone Number:  Annabelle Helling Daughter [phone]  [phone]    Current Level of Care: Hospital Recommended Level of Care: Skilled Nursing Facility Prior Approval Number:    Date Approved/Denied:   PASRR Number: [id number]  Discharge Plan: SNF    Current Diagnoses: Patient Active Problem List   Diagnosis Date Noted   COVID 06/13/2023   Elevated CK 04/28/2023   Alzheimer disease (HCC) 02/17/2023   OAB (overactive bladder) 02/17/2023   LBBB (left bundle branch block) 02/28/2022   Bilateral hearing loss 08/15/2020   Neuropathy 12/27/2019   Osteoporosis 02/20/2018   Cough 02/20/2018   Low back pain 07/06/2016   Right knee pain 07/06/2016   Chronic kidney disease, stage 3a (HCC) 08/13/2014   Routine general medical examination at a health care facility 08/13/2014   Vitamin D  deficiency 12/02/2009   Aortic stenosis, mild 01/24/2008   DEGENERATIVE JOINT DISEASE 11/05/2007   Hypothyroidism 10/31/2007   Essential hypertension 10/31/2007   Allergic rhinitis 10/31/2007   Mixed hyperlipidemia 04/12/2007   Anxiety state 04/12/2007   GERD without esophagitis 04/12/2007    Orientation RESPIRATION BLADDER Height & Weight     Self  Normal Continent Weight:   Height:     BEHAVIORAL SYMPTOMS/MOOD NEUROLOGICAL BOWEL NUTRITION STATUS      Continent Diet  AMBULATORY STATUS COMMUNICATION OF NEEDS Skin   Limited Assist Verbally Normal                       Personal Care Assistance Level of Assistance  Bathing, Feeding, Dressing Bathing Assistance: Limited  assistance Feeding assistance: Independent Dressing Assistance: Limited assistance     Functional Limitations Info  Speech, Hearing, Sight Sight Info: Adequate Hearing Info: Adequate Speech Info: Adequate    SPECIAL CARE FACTORS FREQUENCY  PT (By licensed PT), OT (By licensed OT)     PT Frequency: Min 5x a week OT Frequency: Min 5x a week            Contractures Contractures Info: Not present    Additional Factors Info  Code Status, Allergies Code Status Info: Full Code Allergies Info: Sulfonamide Derivatives           Current Medications (06/14/2023):  This is the current hospital active medication list Current Facility-Administered Medications  Medication Dose Route Frequency Provider Last Rate Last Admin   acetaminophen  (TYLENOL ) tablet 650 mg  650 mg Oral Q6H PRN Shona Laurence N, DO       ALPRAZolam  (XANAX ) tablet 0.5 mg  0.5 mg Oral QHS PRN Drusilla Sabas RAMAN, MD       enoxaparin  (LOVENOX ) injection 40 mg  40 mg Subcutaneous Q24H Hall, Carole N, DO   40 mg at 06/14/23 1029   hydrALAZINE  (APRESOLINE ) injection 5 mg  5 mg Intravenous Once PRN Daniels, James K, NP       levothyroxine  (SYNTHROID ) tablet 50 mcg  50 mcg Oral Q0600 Shona Laurence SAILOR, DO   50 mcg at 06/14/23 0454   losartan  (COZAAR ) tablet 50 mg  50 mg Oral Daily Shona Laurence N, DO   50  mg at 06/14/23 1028   melatonin tablet 5 mg  5 mg Oral QHS PRN Shona Laurence N, DO   5 mg at 06/14/23 0010   pantoprazole  (PROTONIX ) EC tablet 40 mg  40 mg Oral Daily Shona Laurence SAILOR, DO   40 mg at 06/14/23 1028   polyethylene glycol (MIRALAX  / GLYCOLAX ) packet 17 g  17 g Oral Daily PRN Shona Laurence SAILOR, DO       pravastatin  (PRAVACHOL ) tablet 20 mg  20 mg Oral Daily Shona Laurence N, DO   20 mg at 06/14/23 1029   prochlorperazine  (COMPAZINE ) injection 5 mg  5 mg Intravenous Q6H PRN Shona Laurence SAILOR, DO         Discharge Medications: Please see discharge summary for a list of discharge medications.  Relevant Imaging  Results:  Relevant Lab Results:   Additional Information SSN [id number]  Dalila Camellia SAUNDERS, LCSW

## 2023-06-14 NOTE — Evaluation (Signed)
 Occupational Therapy Evaluation Patient Details Name: Deanna Olson MRN: [id number] DOB: 01/28/1926 Today's Date: 06/14/2023   History of Present Illness Patient is 89 yo female admitted observation on 06/12/23 with fall at home, COVID +.  All imaging negative for acute changes.  Pt with hx including but not limited to aortic stenosis, LBBB, HTN, hypothyroidism, CKD   Clinical Impression   Patient evaluated by Occupational Therapy with no further acute OT needs identified. All education has been completed and the patient has no further questions. Patient is supervision for ADLs in room. Patient appears to be at baseline.  See below for any follow-up Occupational Therapy or equipment needs. OT is signing off. Thank you for this referral.        If plan is discharge home, recommend the following: Assistance with cooking/housework;Direct supervision/assist for medications management;Supervision due to cognitive status;Assist for transportation;Help with stairs or ramp for entrance;Direct supervision/assist for financial management    Functional Status Assessment  Patient has not had a recent decline in their functional status  Equipment Recommendations  None recommended by OT       Precautions / Restrictions Precautions Precautions: Fall Restrictions Weight Bearing Restrictions Per Provider Order: No      Mobility Bed Mobility Overal bed mobility: Needs Assistance Bed Mobility: Supine to Sit, Sit to Supine     Supine to sit: Supervision Sit to supine: Supervision              Balance Overall balance assessment: Needs assistance, History of Falls Sitting-balance support: No upper extremity supported Sitting balance-Leahy Scale: Good     Standing balance support: No upper extremity supported, Bilateral upper extremity supported Standing balance-Leahy Scale: Fair           ADL either performed or assessed with clinical judgement   ADL Overall ADL's : At baseline          General ADL Comments: patient is supervision for bed mobility, transfers, toileting, LB Dressing and grooming tasks at sink. per chart patients daughters are persuing ALF but have nto found one at this time. patient appears to be at baseline at this time.                  Pertinent Vitals/Pain Pain Assessment Pain Assessment: No/denies pain     Extremity/Trunk Assessment Upper Extremity Assessment Upper Extremity Assessment: Overall WFL for tasks assessed   Lower Extremity Assessment Lower Extremity Assessment: Defer to PT evaluation   Cervical / Trunk Assessment Cervical / Trunk Assessment: Normal      Cognition Arousal: Alert Behavior During Therapy: WFL for tasks assessed/performed Overall Cognitive Status: History of cognitive impairments - at baseline         General Comments: Pt does not have dx of dementia but family reports poor safety awareness and memory at baseline.                Home Living Family/patient expects to be discharged to:: Private residence Living Arrangements: Alone Available Help at Discharge: Family;Available PRN/intermittently Type of Home: House Home Access: Stairs to enter [employer] of Steps: just threshold   Home Layout: One level     Bathroom Shower/Tub: Producer, Television/film/video: Handicapped height     Home Equipment: Agricultural Consultant (2 wheels);Rollator (4 wheels);Cane - single point;Grab bars - tub/shower          Prior Functioning/Environment Prior Level of Function : Independent/Modified Independent  Mobility Comments: Normally ambulates with RW; could ambulate in community but fatigued easily; 2 falls last 6 months from LOB ADLs Comments: Daughter's drive; Pt independent with ADLs; Family assist with IADLs                 OT Goals(Current goals can be found in the care plan section) Acute Rehab OT Goals OT Goal Formulation: All assessment and education complete,  DC therapy  OT Frequency:         AM-PAC OT 6 Clicks Daily Activity     Outcome Measure Help from another person eating meals?: A Little Help from another person taking care of personal grooming?: A Little Help from another person toileting, which includes using toliet, bedpan, or urinal?: A Little Help from another person bathing (including washing, rinsing, drying)?: A Little Help from another person to put on and taking off regular upper body clothing?: A Little Help from another person to put on and taking off regular lower body clothing?: A Little 6 Click Score: 18   End of Session Equipment Utilized During Treatment: Rolling walker (2 wheels) Nurse Communication: Mobility status  Activity Tolerance: Patient tolerated treatment well Patient left: in bed;with call bell/phone within reach;with bed alarm set  OT Visit Diagnosis: Unsteadiness on feet (R26.81)                Time: 8667-8653 OT Time Calculation (min): 14 min Charges:  OT General Charges $OT Visit: 1 Visit OT Evaluation $OT Eval Low Complexity: 1 Low  Deanna Olson OTR/L, MS Acute Rehabilitation Department Office# [phone]   Deanna Olson 06/14/2023, 3:46 PM

## 2023-06-14 NOTE — Plan of Care (Signed)
  Problem: Education: Goal: Knowledge of risk factors and measures for prevention of condition will improve Outcome: Progressing   Problem: Coping: Goal: Psychosocial and spiritual needs will be supported Outcome: Progressing   Problem: Respiratory: Goal: Will maintain a patent airway Outcome: Progressing   Problem: Education: Goal: Knowledge of General Education information will improve Description: Including pain rating scale, medication(s)/side effects and non-pharmacologic comfort measures Outcome: Progressing   Problem: Clinical Measurements: Goal: Ability to maintain clinical measurements within normal limits will improve Outcome: Progressing

## 2023-06-15 DIAGNOSIS — U071 COVID-19: Secondary | ICD-10-CM | POA: Diagnosis not present

## 2023-06-15 MED ORDER — ALPRAZOLAM 0.5 MG PO TABS: 0.5000 mg | ORAL_TABLET | Freq: Once | ORAL | Status: DC

## 2023-06-15 NOTE — TOC Transition Note (Addendum)
 Transition of Care [facility]) - Discharge Note   Patient Details  Name: Deanna Olson MRN: [id number] Date of Birth: 03/22/1926  [facility]) CM/SW Contact:  Dalila Camellia SAUNDERS, LCSW Phone Number: 06/15/2023, 12:52 PM   Clinical Narrative:     Per PT recommendations patient is being recommended for [facility] PT.  Patient's family did not have a preference for an agency.  CSW as able to get [facility] PT, RN, and social worker set up through [place] [facility].  CSW to sign off.     Final next level of care: Home w Home Health Services Barriers to Discharge: Barriers Resolved   Patient Goals and CMS Choice Patient states their goals for this hospitalization and ongoing recovery are:: To return home with home health. CMS Medicare.gov Compare Post Acute Care list provided to:: Patient Represenative (must comment) Choice offered to / list presented to : Adult Children Dane ownership interest in [facility].provided to:: Adult Children    Discharge Placement    Home Health PT, RN and social work.       Patient and family notified of of transfer: 06/15/23  Discharge Plan and Services Additional resources added to the After Visit Summary for       Post Acute Care Choice: Skilled Nursing Facility                    HH Arranged: PT, RN, Social Work [facility] Agency: [facility] Date [facility] Agency Contacted: 06/15/23 Time HH Agency Contacted: [phone] Representative spoke with at [facility] Agency: Cindie  Social Drivers of Health (SDOH) Interventions SDOH Screenings   Food Insecurity: No Food Insecurity (06/13/2023)  Housing: Low Risk  (06/13/2023)  Transportation Needs: No Transportation Needs (06/13/2023)  Utilities: Not At Risk (06/13/2023)  Alcohol Screen: Low Risk  (02/13/2021)  Depression (PHQ2-9): Low Risk  (12/24/2022)  Financial Resource Strain: Low Risk  (12/24/2022)  Physical Activity: Inactive (12/24/2022)  Social Connections: Patient Unable To Answer (06/14/2023)  Stress: No  Stress Concern Present (12/24/2022)  Tobacco Use: Low Risk  (06/12/2023)  Health Literacy: Adequate Health Literacy (12/24/2022)     Readmission Risk Interventions     No data to display

## 2023-06-15 NOTE — Discharge Summary (Signed)
 Physician Discharge Summary   Patient: Deanna Olson MRN: [id number] DOB: 12/23/1925  Admit date:     06/12/2023  Discharge date: 06/15/23  Discharge Physician: Sabas GORMAN Brod   PCP: Rollene Almarie LABOR, MD   Recommendations at discharge:   Follow-up PCP in 2 weeks  Discharge Diagnoses: Principal Problem:   COVID  Resolved Problems:   * No resolved hospital problems. [facility] Course: 89 y.o. female with medical history significant for moderate aortic stenosis, LBBB, hypertension, hypothyroidism, CKD 3B, recently seen in the ER on April 22, 2023 after a fall (no fracture), who initially presented to [facility] ED after a fall and altered mental status.  Per the patient she lost her footing and fell.  Endorses a nonproductive cough over a few days duration   the ED, mildly tachypneic with altered mental status, COVID-19 screening test positive.  Noncontrast CT head, cervical spine, and lumbar spine were nonacute.    Assessment and Plan:  COVID-19 viral infection with delirium, improved -Resolved After the patient arrived at [facility], she was alert and oriented x 3. COVID-19 screening test positive on 06/12/2023 Appears to have been symptomatic for a few days, not hypoxic, was not started on antivirals prior to transfer. Home health PT ordered   Hypertension Resume home oral antihypertensives    Hypothyroidism Resume home levothyroxine    History of falls CT head, CT cervical spine, CT lumbar spine nonacute. -Home health PT ordered Fall precautions.   Hyperlipidemia Resume home pravastatin    GERD Resume home PPI   History of anxiety Continue Xanax  as needed   History of polyneuropathy Continue gabapentin  for now due to falls.          Consultants:  Procedures performed:  Disposition: Home Diet recommendation:  Discharge Diet Orders (From admission, onward)     Start     Ordered   06/15/23 0000  Diet - low sodium heart healthy         06/15/23 1214           Regular diet DISCHARGE MEDICATION: Allergies as of 06/15/2023       Reactions   Sulfonamide Derivatives Swelling, Other (See Comments)   Angioedema         Medication List     TAKE these medications    albuterol  108 (90 Base) MCG/ACT inhaler Commonly known as: VENTOLIN  HFA Inhale 1-2 puffs into the lungs every 6 (six) hours as needed for wheezing or shortness of breath.   ALPRAZolam  0.5 MG tablet Commonly known as: XANAX  Take 1 tablet (0.5 mg total) by mouth 2 (two) times daily. What changed: when to take this   b complex vitamins capsule Take 1 capsule by mouth daily after breakfast.   betamethasone  dipropionate 0.05 % lotion APPLY TOPICALLY DAILY AS NEEDED USES ON SCALP AND EARS What changed:  how much to take reasons to take this additional instructions   CAL MAG ZINC +D3 PO Take 1 tablet by mouth daily after breakfast.   gabapentin  100 MG capsule Commonly known as: NEURONTIN  TAKE 1 CAPSULE BY MOUTH EVERY MORNING   gabapentin  300 MG capsule Commonly known as: NEURONTIN  TAKE 1 CAPSULE BY MOUTH EVERY NIGHT AT BEDTIME   guaiFENesin -dextromethorphan 100-10 MG/5ML syrup Commonly known as: ROBITUSSIN DM Take 5 mLs by mouth every 4 (four) hours as needed for cough.   ketoconazole  2 % shampoo Commonly known as: NIZORAL  APPLY TO AFFECTED AREA(S) EVERY FRIDAY What changed: See the new instructions.   levothyroxine  50 MCG tablet  Commonly known as: SYNTHROID  TAKE 1 TABLET BY MOUTH DAILY BEFORE BREAKFAST   losartan  50 MG tablet Commonly known as: COZAAR  Take 1 tablet (50 mg total) by mouth daily.   Myrbetriq  50 MG Tb24 tablet Generic drug: mirabegron  ER TAKE 1 TABLET BY MOUTH DAILY **APPOINTMENT DUE FOR REFILLS** What changed: See the new instructions.   OMEGA 3 PO Take 1 capsule by mouth daily after breakfast.   omeprazole  20 MG capsule Commonly known as: PRILOSEC TAKE 1 CAPSULE BY MOUTH DAILY What changed: when to take  this   Osteo Bi-Flex One Per Day Tabs Take 1 tablet by mouth daily after breakfast.   POTASSIUM PO Take 1 tablet by mouth daily after breakfast.   pravastatin  20 MG tablet Commonly known as: PRAVACHOL  TAKE 1 TABLET BY MOUTH DAILY   PreserVision AREDS 2 Caps Take 1 capsule by mouth daily.   PREVAGEN PO Take 1 capsule by mouth daily after breakfast.   Systane Ultra 0.4-0.3 % Soln Generic drug: Polyethyl Glycol-Propyl Glycol Place 1 drop into both eyes in the morning and at bedtime.   Tylenol  8 Hour Arthritis Pain 650 MG CR tablet Generic drug: acetaminophen  Take 650 mg by mouth 2 (two) times daily.   Tylenol  PM Extra Strength 500-25 MG Tabs tablet Generic drug: diphenhydramine-acetaminophen  Take 1 tablet by mouth at bedtime.   VITAMIN C PO Take 1 tablet by mouth daily after breakfast.   VITAMIN D -3 PO Take 1 capsule by mouth daily after breakfast.   VITAMIN E PO Take 1 capsule by mouth daily after breakfast.   XyliMelts 550 MG Disk Generic drug: Xylitol 1 tablet See admin instructions. Dissolve 1 tablet in the mouth at bedtime        Discharge Exam: There were no vitals filed for this visit. General-appears in no acute distress Heart-S1-S2, regular, no murmur auscultated Lungs-clear to auscultation bilaterally, no wheezing or crackles auscultated Abdomen-soft, nontender, no organomegaly Extremities-no edema in the lower extremities Neuro-alert, oriented x3, no focal deficit noted  Condition at discharge: good  The results of significant diagnostics from this hospitalization (including imaging, microbiology, ancillary and laboratory) are listed below for reference.   Imaging Studies: CT Lumbar Spine Wo Contrast Result Date: 06/12/2023 CLINICAL DATA:  Fall EXAM: CT LUMBAR SPINE WITHOUT CONTRAST TECHNIQUE: Multidetector CT imaging of the lumbar spine was performed without intravenous contrast administration. Multiplanar CT image reconstructions were also  generated. RADIATION DOSE REDUCTION: This exam was performed according to the departmental dose-optimization program which includes automated exposure control, adjustment of the mA and/or kV according to patient size and/or use of iterative reconstruction technique. COMPARISON:  None Available. FINDINGS: Segmentation: 5 lumbar type vertebrae. Alignment: Levoscoliosis with apex at L3. Grade 1 anterolisthesis at L5-S1. Vertebrae: Chronic compression deformities of L3 and L4, status post augmentation. No acute fracture. Paraspinal and other soft tissues: Calcific aortic atherosclerosis. Disc levels: No bony spinal canal stenosis. No high-grade foraminal stenosis. Multilevel facet arthrosis. IMPRESSION: 1. No acute fracture or static subluxation of the lumbar spine. 2. Chronic compression deformities of L3 and L4, status post augmentation. Aortic Atherosclerosis (ICD10-I70.0). Electronically Signed   By: Franky Stanford M.D.   On: 06/12/2023 22:53   CT Head Wo Contrast Result Date: 06/12/2023 CLINICAL DATA:  Fall EXAM: CT HEAD WITHOUT CONTRAST CT CERVICAL SPINE WITHOUT CONTRAST TECHNIQUE: Multidetector CT imaging of the head and cervical spine was performed following the standard protocol without intravenous contrast. Multiplanar CT image reconstructions of the cervical spine were also generated. RADIATION DOSE REDUCTION: This  exam was performed according to the departmental dose-optimization program which includes automated exposure control, adjustment of the mA and/or kV according to patient size and/or use of iterative reconstruction technique. COMPARISON:  04/22/2023 FINDINGS: CT HEAD FINDINGS Brain: There is no mass, hemorrhage or extra-axial collection. There is generalized atrophy without lobar predilection. Hypodensity of the white matter is most commonly associated with chronic microvascular disease. Chronic calcification in the right frontal white matter. Vascular: No hyperdense vessel or unexpected vascular  calcification. Skull: The visualized skull base, calvarium and extracranial soft tissues are normal. Sinuses/Orbits: No fluid levels or advanced mucosal thickening of the visualized paranasal sinuses. No mastoid or middle ear effusion. Normal orbits. Other: None. CT CERVICAL SPINE FINDINGS Alignment: No static subluxation. Facets are aligned. Occipital condyles are normally positioned. Skull base and vertebrae: No acute fracture. Soft tissues and spinal canal: No prevertebral fluid or swelling. No visible canal hematoma. Disc levels: No advanced spinal canal or neural foraminal stenosis. Upper chest: No pneumothorax, pulmonary nodule or pleural effusion. Other: Normal visualized paraspinal cervical soft tissues. IMPRESSION: 1. No acute intracranial abnormality. 2. No acute fracture or static subluxation of the cervical spine. Electronically Signed   By: Franky Stanford M.D.   On: 06/12/2023 22:38   CT Cervical Spine Wo Contrast Result Date: 06/12/2023 CLINICAL DATA:  Fall EXAM: CT HEAD WITHOUT CONTRAST CT CERVICAL SPINE WITHOUT CONTRAST TECHNIQUE: Multidetector CT imaging of the head and cervical spine was performed following the standard protocol without intravenous contrast. Multiplanar CT image reconstructions of the cervical spine were also generated. RADIATION DOSE REDUCTION: This exam was performed according to the departmental dose-optimization program which includes automated exposure control, adjustment of the mA and/or kV according to patient size and/or use of iterative reconstruction technique. COMPARISON:  04/22/2023 FINDINGS: CT HEAD FINDINGS Brain: There is no mass, hemorrhage or extra-axial collection. There is generalized atrophy without lobar predilection. Hypodensity of the white matter is most commonly associated with chronic microvascular disease. Chronic calcification in the right frontal white matter. Vascular: No hyperdense vessel or unexpected vascular calcification. Skull: The visualized  skull base, calvarium and extracranial soft tissues are normal. Sinuses/Orbits: No fluid levels or advanced mucosal thickening of the visualized paranasal sinuses. No mastoid or middle ear effusion. Normal orbits. Other: None. CT CERVICAL SPINE FINDINGS Alignment: No static subluxation. Facets are aligned. Occipital condyles are normally positioned. Skull base and vertebrae: No acute fracture. Soft tissues and spinal canal: No prevertebral fluid or swelling. No visible canal hematoma. Disc levels: No advanced spinal canal or neural foraminal stenosis. Upper chest: No pneumothorax, pulmonary nodule or pleural effusion. Other: Normal visualized paraspinal cervical soft tissues. IMPRESSION: 1. No acute intracranial abnormality. 2. No acute fracture or static subluxation of the cervical spine. Electronically Signed   By: Franky Stanford M.D.   On: 06/12/2023 22:38   DG Chest Portable 1 View Result Date: 06/12/2023 CLINICAL DATA:  Fall EXAM: PORTABLE CHEST 1 VIEW COMPARISON:  CT chest 04/28/2022, chest x-ray 02/01/2022 FINDINGS: The heart and mediastinal contours are within normal limits. Atherosclerotic plaque. Biapical pleural/pulmonary scarring. No focal consolidation. Chronic coarsened interstitial markings with no overt pulmonary edema. No pleural effusion. No pneumothorax. No acute osseous abnormality. IMPRESSION: 1. No active disease. 2.  Aortic Atherosclerosis (ICD10-I70.0). Electronically Signed   By: Morgane  Naveau M.D.   On: 06/12/2023 22:16    Microbiology: Results for orders placed or performed during the hospital encounter of 06/12/23  Resp panel by RT-PCR (RSV, Flu A&B, Covid) Urine, Clean Catch  Status: Abnormal   Collection Time: 06/12/23  8:50 PM   Specimen: Urine, Clean Catch; Nasal Swab  Result Value Ref Range Status   SARS Coronavirus 2 by RT PCR POSITIVE (A) NEGATIVE Final    Comment: (NOTE) SARS-CoV-2 target nucleic acids are DETECTED.  The SARS-CoV-2 RNA is generally detectable  in upper respiratory specimens during the acute phase of infection. Positive results are indicative of the presence of the identified virus, but do not rule out bacterial infection or co-infection with other pathogens not detected by the test. Clinical correlation with patient history and other diagnostic information is necessary to determine patient infection status. The expected result is Negative.  Fact Sheet for Patients: bloggercourse.com  Fact Sheet for Healthcare Providers: seriousbroker.it  This test is not yet approved or cleared by the [place]  FDA and  has been authorized for detection and/or diagnosis of SARS-CoV-2 by FDA under an Emergency Use Authorization (EUA).  This EUA will remain in effect (meaning this test can be used) for the duration of  the COVID-19 declaration under Section 564(b)(1) of the A ct, 21 U.S.C. section 360bbb-3(b)(1), unless the authorization is terminated or revoked sooner.     Influenza A by PCR NEGATIVE NEGATIVE Final   Influenza B by PCR NEGATIVE NEGATIVE Final    Comment: (NOTE) The Xpert Xpress SARS-CoV-2/FLU/RSV plus assay is intended as an aid in the diagnosis of influenza from Nasopharyngeal swab specimens and should not be used as a sole basis for treatment. Nasal washings and aspirates are unacceptable for Xpert Xpress SARS-CoV-2/FLU/RSV testing.  Fact Sheet for Patients: bloggercourse.com  Fact Sheet for Healthcare Providers: seriousbroker.it  This test is not yet approved or cleared by the [place]  FDA and has been authorized for detection and/or diagnosis of SARS-CoV-2 by FDA under an Emergency Use Authorization (EUA). This EUA will remain in effect (meaning this test can be used) for the duration of the COVID-19 declaration under Section 564(b)(1) of the Act, 21 U.S.C. section 360bbb-3(b)(1), unless the authorization  is terminated or revoked.     Resp Syncytial Virus by PCR NEGATIVE NEGATIVE Final    Comment: (NOTE) Fact Sheet for Patients: bloggercourse.com  Fact Sheet for Healthcare Providers: seriousbroker.it  This test is not yet approved or cleared by the [place]  FDA and has been authorized for detection and/or diagnosis of SARS-CoV-2 by FDA under an Emergency Use Authorization (EUA). This EUA will remain in effect (meaning this test can be used) for the duration of the COVID-19 declaration under Section 564(b)(1) of the Act, 21 U.S.C. section 360bbb-3(b)(1), unless the authorization is terminated or revoked.  Performed at [facility], [address] [place], [place] [address]     Labs: CBC: Recent Labs  Lab 06/12/23 2124 06/13/23 0449  WBC 7.1 6.6  NEUTROABS 4.4  --   HGB 11.9* 12.0  HCT 36.3 38.7  MCV 92.8 98.2  PLT 163 150   Basic Metabolic Panel: Recent Labs  Lab 06/12/23 2240 06/13/23 0449  NA 136 134*  K 4.2 3.9  CL 100 100  CO2 28 26  GLUCOSE 92 89  BUN 23 19  CREATININE 1.07* 0.94  CALCIUM  9.2 8.9  MG  --  2.1  PHOS  --  3.1   Liver Function Tests: Recent Labs  Lab 06/12/23 2240  AST 22  ALT 13  ALKPHOS 37*  BILITOT 0.4  PROT 7.3  ALBUMIN 4.2   CBG: No results for input(s): GLUCAP in the last 168 hours.  Discharge  time spent: greater than 30 minutes.  Signed: Sabas GORMAN Brod, MD [facility] 06/15/2023

## 2023-06-16 ENCOUNTER — Telehealth: Payer: Self-pay

## 2023-06-16 NOTE — Telephone Encounter (Signed)
 I have sent patient a my chart message

## 2023-06-16 NOTE — Telephone Encounter (Signed)
 miconazole nitrate 2%  I have also forwarded you a my chart message with the same response. You can done that one.

## 2023-06-16 NOTE — Transitions of Care (Post Inpatient/ED Visit) (Signed)
   06/16/2023  Name: Deanna Olson MRN: [id number] DOB: [birth year]  Today's TOC FU Call Status: Today's TOC FU Call Status:: Unsuccessful Call (1st Attempt) Unsuccessful Call (1st Attempt) Date: 06/16/23  Attempted to reach the patient regarding the most recent Inpatient/ED visit.  Follow Up Plan: Additional outreach attempts will be made to reach the patient to complete the Transitions of Care (Post Inpatient/ED visit) call.   Kazmir Oki, CMA  [facility] AWV Team Direct Dial: [phone]

## 2023-06-16 NOTE — Telephone Encounter (Signed)
 Copied from CRM [id number]. Topic: General - Other >> Jun 16, 2023  9:06 AM Leotis ORN wrote: Reason for CRM: pt was discharged01/01/25 from [place]. PTs daughter Macario  requesting a callback regarding a few concerns for the pt, she would not state what concerns.  Callback# [phone]

## 2023-06-16 NOTE — Telephone Encounter (Signed)
 I don't see any powder on list. Can you identify further what rx they are asking about?

## 2023-06-17 NOTE — Telephone Encounter (Signed)
 No I would not use that on genitals. I would recommend A and E or other diaper cream on genitals for redness

## 2023-06-19 DIAGNOSIS — I350 Nonrheumatic aortic (valve) stenosis: Secondary | ICD-10-CM | POA: Diagnosis not present

## 2023-06-19 DIAGNOSIS — I872 Venous insufficiency (chronic) (peripheral): Secondary | ICD-10-CM | POA: Diagnosis not present

## 2023-06-19 DIAGNOSIS — N1832 Chronic kidney disease, stage 3b: Secondary | ICD-10-CM | POA: Diagnosis not present

## 2023-06-19 DIAGNOSIS — Z9181 History of falling: Secondary | ICD-10-CM | POA: Diagnosis not present

## 2023-06-19 DIAGNOSIS — I447 Left bundle-branch block, unspecified: Secondary | ICD-10-CM | POA: Diagnosis not present

## 2023-06-19 DIAGNOSIS — F411 Generalized anxiety disorder: Secondary | ICD-10-CM | POA: Diagnosis not present

## 2023-06-19 DIAGNOSIS — E039 Hypothyroidism, unspecified: Secondary | ICD-10-CM | POA: Diagnosis not present

## 2023-06-19 DIAGNOSIS — M438X6 Other specified deforming dorsopathies, lumbar region: Secondary | ICD-10-CM | POA: Diagnosis not present

## 2023-06-19 DIAGNOSIS — U071 COVID-19: Secondary | ICD-10-CM | POA: Diagnosis not present

## 2023-06-19 DIAGNOSIS — G629 Polyneuropathy, unspecified: Secondary | ICD-10-CM | POA: Diagnosis not present

## 2023-06-19 DIAGNOSIS — K219 Gastro-esophageal reflux disease without esophagitis: Secondary | ICD-10-CM | POA: Diagnosis not present

## 2023-06-19 DIAGNOSIS — I700 Atherosclerosis of aorta: Secondary | ICD-10-CM | POA: Diagnosis not present

## 2023-06-19 DIAGNOSIS — I129 Hypertensive chronic kidney disease with stage 1 through stage 4 chronic kidney disease, or unspecified chronic kidney disease: Secondary | ICD-10-CM | POA: Diagnosis not present

## 2023-06-19 DIAGNOSIS — E785 Hyperlipidemia, unspecified: Secondary | ICD-10-CM | POA: Diagnosis not present

## 2023-06-20 ENCOUNTER — Telehealth: Payer: Self-pay

## 2023-06-20 NOTE — Telephone Encounter (Signed)
 Ok for verbals but needs to keep upcoming hospital follow up visit. She other message about recommendations for irritation genitally okay to use lotrimin on breast area.

## 2023-06-20 NOTE — Telephone Encounter (Signed)
 Called Deanna Olson back and informed her about the verbal orders and yeast under the breast

## 2023-06-20 NOTE — Telephone Encounter (Signed)
 Copied from CRM [id number]. Topic: Clinical - Home Health Verbal Orders >> Jun 20, 2023  8:58 AM Isabell A wrote:  Caller/Agency: Tanya from [facility] health Callback Number: [phone] ok to leave voicemail Service Requested: Nursing Frequency: 2 week 1, 1 week 3 Any new concerns about the patient? Yes, Patient was hospitalized with COVID 06/12/23 - pt coughing brown sputum. Patient is not taking any of her supplements. Patient has a terrible yeast rash under her breast & to her groin, Glenys wants to know if it is ok to use Lotrimin  & [place] [employer].

## 2023-06-25 ENCOUNTER — Other Ambulatory Visit: Payer: Self-pay | Admitting: Internal Medicine

## 2023-06-27 ENCOUNTER — Ambulatory Visit (INDEPENDENT_AMBULATORY_CARE_PROVIDER_SITE_OTHER): Payer: PPO | Admitting: Internal Medicine

## 2023-06-27 ENCOUNTER — Encounter: Payer: Self-pay | Admitting: Internal Medicine

## 2023-06-27 VITALS — BP 140/62 | HR 69 | Temp 97.8°F | Ht 60.0 in | Wt 152.0 lb

## 2023-06-27 DIAGNOSIS — G309 Alzheimer's disease, unspecified: Secondary | ICD-10-CM

## 2023-06-27 DIAGNOSIS — F0280 Dementia in other diseases classified elsewhere without behavioral disturbance: Secondary | ICD-10-CM | POA: Diagnosis not present

## 2023-06-27 DIAGNOSIS — U071 COVID-19: Secondary | ICD-10-CM

## 2023-06-27 DIAGNOSIS — N3281 Overactive bladder: Secondary | ICD-10-CM

## 2023-06-27 DIAGNOSIS — R21 Rash and other nonspecific skin eruption: Secondary | ICD-10-CM

## 2023-06-27 MED ORDER — MICONAZOLE 2 % EX POWD: 1.0000 | Freq: Two times a day (BID) | CUTANEOUS | 11 refills | Status: DC

## 2023-06-27 MED ORDER — FLUCONAZOLE 150 MG PO TABS: 150.0000 mg | ORAL_TABLET | ORAL | 0 refills | Status: DC

## 2023-06-27 MED ORDER — MICONAZOLE NITRATE 2 % EX CREA: 1.0000 | TOPICAL_CREAM | Freq: Two times a day (BID) | CUTANEOUS | 11 refills | Status: DC

## 2023-06-27 NOTE — Assessment & Plan Note (Signed)
 Seems improved and did not take antiviral. Still some weakness. Getting PT/OT/nursing at home currently.

## 2023-06-27 NOTE — Progress Notes (Signed)
   Subjective:   Patient ID: Deanna Olson, female    DOB: 1926/04/02, 89 y.o.   MRN: [id number]  HPI The patient is a 89 YO female coming in for hospital follow up (admitted for delirium in the setting of covid-19, observed overnight and mental status recovered not treated with paxlovid due to unclear timeline of symptom onset and no hypoxia or clear respiratory symptoms). She has not fallen since being home and is having Pt/nursing. She has long term care insurance and will be getting assessment for that soon may need forms filled out by provider. Eating well, sleeping well. Denies diarrhea or constipation. Denies cough or chest pain. Moderate rash under breasts and on genital region which is red and itchy.   PMH, [facility], social history reviewed and updated  Discharge medication reconciliation done  Review of Systems  Constitutional:  Positive for activity change and fatigue.  HENT: Negative.    Eyes: Negative.   Respiratory:  Negative for cough, chest tightness and shortness of breath.   Cardiovascular:  Negative for chest pain, palpitations and leg swelling.  Gastrointestinal:  Negative for abdominal distention, abdominal pain, constipation, diarrhea, nausea and vomiting.  Musculoskeletal:  Positive for gait problem.  Skin:  Positive for color change and rash.  Psychiatric/Behavioral: Negative.      Objective:  Physical Exam Constitutional:      Appearance: She is well-developed.  HENT:     Head: Normocephalic and atraumatic.  Cardiovascular:     Rate and Rhythm: Normal rate and regular rhythm.  Pulmonary:     Effort: Pulmonary effort is normal. No respiratory distress.     Breath sounds: Normal breath sounds. No wheezing or rales.  Abdominal:     General: Bowel sounds are normal. There is no distension.     Palpations: Abdomen is soft.     Tenderness: There is no abdominal tenderness. There is no rebound.  Musculoskeletal:     Cervical back: Normal range of motion.  Skin:     General: Skin is warm and dry.     Findings: Rash present.     Comments: Extensive fungal rash under breasts, genitals not examined but similar per patient and daughter.   Neurological:     Mental Status: She is alert and oriented to person, place, and time.     Coordination: Coordination normal.     Comments: Uses cane at home forgot today and uses daughter's arm for stability     Vitals:   06/27/23 0840 06/27/23 0847  BP: (!) 140/62 (!) 140/62  Pulse: 69   Temp: 97.8 F (36.6 C)   TempSrc: Oral   SpO2: 98%   Weight: 152 lb (68.9 kg)   Height: 5' (1.524 m)     Assessment & Plan:

## 2023-06-27 NOTE — Assessment & Plan Note (Signed)
 Overall stable but dose require assistance in cooking meals, handling medications and some IADLS.

## 2023-06-27 NOTE — Patient Instructions (Signed)
 We will check for the form from the long term care insurance.  We have sent in the diflucan  to take 1 pill today. Then 1 pill on Thursday. Then 1 pill next Monday.   We have also sent in the antifungal cream to use on chest area and genital region twice a day.

## 2023-06-27 NOTE — Assessment & Plan Note (Signed)
 Gets up about 2-3 times at night to use restroom and they are leaving lights on for safety. Taking myrbetriq 50 mg daily and this helps some.

## 2023-06-27 NOTE — Assessment & Plan Note (Signed)
 Rx miconazole  powder for breast area to help. Rx miconazole  cream to use BID on the genital region and then barrier cream over that (either zinc oxide, a and e, vaseline). Is using pull ups and they change when wet. We discussed airing this out when able to help and discussed this is similar to diaper rash and should use barrier cream ongoing to prevent recurrence. Given the large surface area this is affecting rx diflucan  3 pills to use q 72 hours to help this accelerate healing.

## 2023-06-28 ENCOUNTER — Telehealth: Payer: Self-pay

## 2023-06-28 NOTE — Telephone Encounter (Signed)
 Copied from CRM [id number]. Topic: Clinical - Medication Question >> Jun 28, 2023  4:04 PM Franky GRADE wrote: Reason for CRM: Karna from [facility] is calling to report a level two interaction between her Xanax  medication and fluconazole  (DIFLUCAN ) 150 MG tablet that was prescribed yesterday. Best call back number for [facility] [phone]. She would like a call back from Dr.Crawford or the nurse if possible.

## 2023-06-29 NOTE — Telephone Encounter (Signed)
 Ok to continue both

## 2023-06-29 NOTE — Telephone Encounter (Signed)
 I have called back and made this aware to the home health nurse

## 2023-07-05 ENCOUNTER — Other Ambulatory Visit: Payer: Self-pay | Admitting: Internal Medicine

## 2023-07-07 ENCOUNTER — Telehealth: Payer: Self-pay | Admitting: Internal Medicine

## 2023-07-07 NOTE — Telephone Encounter (Signed)
Noted I have this form and will fax out tomorrow during my admin time

## 2023-07-07 NOTE — Telephone Encounter (Signed)
Copied from CRM [id number]. Topic: Clinical - Home Health Verbal Orders >> Jul 07, 2023 10:44 AM Cipriano Bunker wrote: Caller/Agency: [facility] Callback Number: [phone] Service Requested: N/A Frequency: N/A Any new concerns about the patient? Yes; Sent over Home Health certification and plan of care on 06/19/2023 and not received back yet,Wanda also re-faxing this over again today. Fax #[phone]

## 2023-07-08 ENCOUNTER — Ambulatory Visit: Payer: Self-pay | Admitting: Internal Medicine

## 2023-07-08 DIAGNOSIS — I350 Nonrheumatic aortic (valve) stenosis: Secondary | ICD-10-CM | POA: Diagnosis not present

## 2023-07-08 DIAGNOSIS — G629 Polyneuropathy, unspecified: Secondary | ICD-10-CM | POA: Diagnosis not present

## 2023-07-08 DIAGNOSIS — E039 Hypothyroidism, unspecified: Secondary | ICD-10-CM | POA: Diagnosis not present

## 2023-07-08 DIAGNOSIS — F411 Generalized anxiety disorder: Secondary | ICD-10-CM | POA: Diagnosis not present

## 2023-07-08 DIAGNOSIS — M438X6 Other specified deforming dorsopathies, lumbar region: Secondary | ICD-10-CM | POA: Diagnosis not present

## 2023-07-08 DIAGNOSIS — I447 Left bundle-branch block, unspecified: Secondary | ICD-10-CM | POA: Diagnosis not present

## 2023-07-08 DIAGNOSIS — I129 Hypertensive chronic kidney disease with stage 1 through stage 4 chronic kidney disease, or unspecified chronic kidney disease: Secondary | ICD-10-CM | POA: Diagnosis not present

## 2023-07-08 DIAGNOSIS — K219 Gastro-esophageal reflux disease without esophagitis: Secondary | ICD-10-CM | POA: Diagnosis not present

## 2023-07-08 DIAGNOSIS — N1832 Chronic kidney disease, stage 3b: Secondary | ICD-10-CM | POA: Diagnosis not present

## 2023-07-08 DIAGNOSIS — I700 Atherosclerosis of aorta: Secondary | ICD-10-CM | POA: Diagnosis not present

## 2023-07-08 DIAGNOSIS — U071 COVID-19: Secondary | ICD-10-CM | POA: Diagnosis not present

## 2023-07-08 DIAGNOSIS — I872 Venous insufficiency (chronic) (peripheral): Secondary | ICD-10-CM | POA: Diagnosis not present

## 2023-07-08 NOTE — Telephone Encounter (Signed)
This has been fax back

## 2023-07-08 NOTE — Telephone Encounter (Signed)
Copied from CRM [id number]. Topic: Clinical - Red Word Triage >> Jul 08, 2023  9:20 AM Louie Casa B wrote: [facility] that prompted transfer to Nurse Triage: patient has been seen for a rash under her breast about 2 weeks ago and it is not getting any better  Chief Complaint: Rash Symptoms: Discomfort and itching Frequency: 6 weeks Pertinent Negatives: Patient denies relief Disposition: [] ED /[] Urgent Care (no appt availability in office) / [x] Appointment(In office/virtual)/ []  [facility]/ [] Home Care/ [] Refused Recommended Disposition /[] [place] [facility]/ []  Follow-up with PCP Additional Notes: Patient's daughter called in on behalf of her mother who is experiencing a rash. Daughter stated the rash is located under the breasts down to the stomach area and in the vaginal area. Daughter described the rash as red and "consistent" and denied bumps and blisters. Daughter denied fever in the patient. Daughter stated the rash started about 6 weeks ago and the patient was seen in the office for it 2-3 weeks ago. Daughter stated that the rash has not improved and has spread further down the patient's stomach since her last office visit. Patient has been taking Diflucan and applying Micatin cream and Miconazole powder. Daughter stated that the medications have not made much of a difference. Advised patient to be seen in the office within 3 days. No availability with patient's PCP until 2/3. Scheduled patient in office with a different provider for Monday, 1/27. Daughter is requesting for patient to see PCP, if appointments become available. Daughter also stated that patient is out of Diflucan and is requesting additional medication to be called in. Advised daughter/patient to call back if symptoms worsen or if additional questions arise. Daughter/patient complied.   Reason for Disposition  Mild widespread rash  (Exception: Heat rash lasting 3 days or less.)  Answer Assessment - Initial Assessment  Questions 1. APPEARANCE of RASH: "Describe the rash." (e.g., spots, blisters, raised areas, skin peeling, scaly)     Red, no bumps or blisters, "raw looking"  2. SIZE: "How big are the spots?" (e.g., tip of pen, eraser, coin; inches, centimeters)     Denies spots, redness is "consistent"  3. LOCATION: "Where is the rash located?"     Under breast down to stomach, vaginal area, states rash has spread further down her stomach since the last office visit  4. COLOR: "What color is the rash?" (Note: It is difficult to assess rash color in people with darker-colored skin. When this situation occurs, simply ask the caller to describe what they see.)     Red  5. ONSET: "When did the rash begin?"     Started 6 weeks ago, but seen in office for it 2-3 weeks ago  6. FEVER: "Do you have a fever?" If Yes, ask: "What is your temperature, how was it measured, and when did it start?"     Denies  7. ITCHING: "Does the rash itch?" If Yes, ask: "How bad is the itch?" (Scale 1-10; or mild, moderate, severe)     Yes   8. CAUSE: "What do you think is causing the rash?"     Denies  9. MEDICINE FACTORS: "Have you started any new medicines within the last 2 weeks?" (e.g., antibiotics)      Diflucan, Micatin cream, Miconazole powder started after last office visit 2-3 weeks ago  10. OTHER SYMPTOMS: "Do you have any other symptoms?" (e.g., dizziness, headache, sore throat, joint pain)       Denies  Protocols used: Rash or Redness -  Widespread-A-AH

## 2023-07-11 ENCOUNTER — Ambulatory Visit (INDEPENDENT_AMBULATORY_CARE_PROVIDER_SITE_OTHER): Payer: PPO | Admitting: Internal Medicine

## 2023-07-11 ENCOUNTER — Encounter: Payer: Self-pay | Admitting: Internal Medicine

## 2023-07-11 VITALS — BP 136/68 | HR 62 | Temp 98.1°F | Ht 60.0 in | Wt 150.0 lb

## 2023-07-11 DIAGNOSIS — B354 Tinea corporis: Secondary | ICD-10-CM | POA: Diagnosis not present

## 2023-07-11 DIAGNOSIS — B356 Tinea cruris: Secondary | ICD-10-CM | POA: Diagnosis not present

## 2023-07-11 MED ORDER — KETOCONAZOLE 2 % EX CREA: 1.0000 | TOPICAL_CREAM | Freq: Every day | CUTANEOUS | 0 refills | Status: DC

## 2023-07-11 MED ORDER — CEPHALEXIN 500 MG PO CAPS: 500.0000 mg | ORAL_CAPSULE | Freq: Three times a day (TID) | ORAL | 0 refills | Status: DC

## 2023-07-11 NOTE — Progress Notes (Signed)
Subjective:    Patient ID: Deanna Olson, female    DOB: 1925-10-05, 89 y.o.   MRN: [id number]      HPI Deanna Olson is here for  Chief Complaint  Patient presents with   Rash    Rash underneath both breast; Groin area   She is here today with her daughter.  Rash over one month -  waxes and wanes with the redness.  Under breasts and in groin.  The rash has not been improving with the current treatment.  Using miconazole cream, fluconazole once a week week x 3 weeks miconazole powder spray, they have also tried Goldbond powder, Zeasorb powder.  She states the rash itches, Mihailo Sage and at times stings a little.  When she sprayed the powder spray it did sting.  She has been using a lot of powder.  The daughter states the rash is very raw in the creases.    Medications and allergies reviewed with patient and updated if appropriate.  Current Outpatient Medications on File Prior to Visit  Medication Sig Dispense Refill   albuterol (VENTOLIN HFA) 108 (90 Base) MCG/ACT inhaler Inhale 1-2 puffs into the lungs every 6 (six) hours as needed for wheezing or shortness of breath.     ALPRAZolam (XANAX) 0.5 MG tablet Take 1 tablet (0.5 mg total) by mouth 2 (two) times daily. (Patient taking differently: Take 0.5 mg by mouth at bedtime.) 60 tablet 5   Apoaequorin (PREVAGEN PO) Take 1 capsule by mouth daily after breakfast.     Ascorbic Acid (VITAMIN C PO) Take 1 tablet by mouth daily after breakfast.     b complex vitamins capsule Take 1 capsule by mouth daily after breakfast.     betamethasone dipropionate 0.05 % lotion APPLY TOPICALLY DAILY AS NEEDED USES ON SCALP AND EARS (Patient taking differently: Apply 1 application  topically daily as needed (for irritation- behind the ears and scalp).) 60 mL 2   Boswellia-Glucosamine-Vit D (OSTEO BI-FLEX ONE PER DAY) TABS Take 1 tablet by mouth daily after breakfast.     Cholecalciferol (VITAMIN D-3 PO) Take 1 capsule by mouth daily after breakfast.      fluconazole (DIFLUCAN) 150 MG tablet Take 1 tablet (150 mg total) by mouth every 3 (three) days. 3 tablet 0   gabapentin (NEURONTIN) 100 MG capsule TAKE 1 CAPSULE BY MOUTH EVERY MORNING 90 capsule 0   gabapentin (NEURONTIN) 300 MG capsule TAKE 1 CAPSULE BY MOUTH EVERY NIGHT AT BEDTIME 90 capsule 1   guaiFENesin-dextromethorphan (ROBITUSSIN DM) 100-10 MG/5ML syrup Take 5 mLs by mouth every 4 (four) hours as needed for cough. 118 mL 0   ketoconazole (NIZORAL) 2 % shampoo APPLY TO AFFECTED AREA(S) EVERY FRIDAY (Patient taking differently: Apply 1 Application topically every Friday.) 120 mL 0   levothyroxine (SYNTHROID) 50 MCG tablet TAKE 1 TABLET BY MOUTH DAILY BEFORE BREAKFAST 90 tablet 2   losartan (COZAAR) 50 MG tablet Take 1 tablet (50 mg total) by mouth daily. 90 tablet 3   miconazole (MICATIN) 2 % cream Apply 1 Application topically 2 (two) times daily. 198 g 11   Miconazole 2 % POWD Apply 1 each topically in the morning and at bedtime. 85 g 11   mirabegron ER (MYRBETRIQ) 50 MG TB24 tablet TAKE 1 TABLET BY MOUTH DAILY APPOINTMENT DUE FOR REFILLS 30 tablet 0   Multiple Minerals-Vitamins (CAL MAG ZINC +D3 PO) Take 1 tablet by mouth daily after breakfast.     Multiple Vitamins-Minerals (PRESERVISION AREDS 2) CAPS Take  1 capsule by mouth daily.     Omega-3 Fatty Acids (OMEGA 3 PO) Take 1 capsule by mouth daily after breakfast.     omeprazole (PRILOSEC) 20 MG capsule TAKE 1 CAPSULE BY MOUTH DAILY (Patient taking differently: Take 20 mg by mouth daily before breakfast.) 90 capsule 0   POTASSIUM PO Take 1 tablet by mouth daily after breakfast.     pravastatin (PRAVACHOL) 20 MG tablet TAKE 1 TABLET BY MOUTH DAILY 90 tablet 3   SYSTANE ULTRA 0.4-0.3 % SOLN Place 1 drop into both eyes in the morning and at bedtime.     TYLENOL 8 HOUR ARTHRITIS PAIN 650 MG CR tablet Take 650 mg by mouth 2 (two) times daily.     TYLENOL PM EXTRA STRENGTH 500-25 MG TABS tablet Take 1 tablet by mouth at bedtime.     VITAMIN  E PO Take 1 capsule by mouth daily after breakfast.     XYLIMELTS 550 MG DISK 1 tablet See admin instructions. Dissolve 1 tablet in the mouth at bedtime     No current facility-administered medications on file prior to visit.    Review of Systems     Objective:   Vitals:   07/11/23 1047  BP: 136/68  Pulse: 62  Temp: 98.1 F (36.7 C)  SpO2: 94%   BP Readings from Last 3 Encounters:  07/11/23 136/68  06/27/23 (!) 140/62  06/15/23 130/61   Wt Readings from Last 3 Encounters:  07/11/23 150 lb (68 kg)  06/27/23 152 lb (68.9 kg)  04/27/23 152 lb (68.9 kg)   Body mass index is 29.29 kg/m.    Physical Exam Constitutional:      Appearance: Normal appearance. She is not ill-appearing.  Skin:    General: Skin is warm and dry.     Findings: Rash (Erythematous flat rash extending under complete breasts, wrapping around the side of chest wall and in  groin area extending down legs and into the pubic region.  No big breaks in skin / blisters.  some satellite lesions.  warm to touch, not really tender) present.  Neurological:     Mental Status: She is alert.            Assessment & Plan:    Tinea, corporis, tinea cruris: Acute Rash started about 1 month ago Not much improvement with fluconazole once a week x 3 weeks, miconazole cream twice daily and miconazole powder Has also tried other powder such as zeaSorb and Goldbond Daughters have done a good job of trying to keep her dry and are trying to keep normal clothing on her when she is at home Discussed that this is often difficult to treat and we may need to try few things to see what works best Discussed it could be a combination of friction, moisture, yeast infection and even a bacterial infection Start ketoconazole 2% cream once daily Hold powders for now Start Keflex 500 mg 3 times daily x 1 week They will schedule a dermatology appointment-for the rash of is not improving, but also she has a lesion in her left  groin that should be looked at Her daughter will let us know if there is no improvement or any questions

## 2023-07-11 NOTE — Patient Instructions (Addendum)
      Medications changes include :   ketoconazole cream,  keflex       Return if symptoms worsen or fail to improve.

## 2023-07-27 DIAGNOSIS — L2389 Allergic contact dermatitis due to other agents: Secondary | ICD-10-CM | POA: Diagnosis not present

## 2023-07-27 DIAGNOSIS — L821 Other seborrheic keratosis: Secondary | ICD-10-CM | POA: Diagnosis not present

## 2023-08-02 ENCOUNTER — Other Ambulatory Visit: Payer: Self-pay | Admitting: Internal Medicine

## 2023-08-06 ENCOUNTER — Other Ambulatory Visit: Payer: Self-pay | Admitting: Internal Medicine

## 2023-08-15 ENCOUNTER — Telehealth: Payer: Self-pay | Admitting: Internal Medicine

## 2023-08-15 NOTE — Telephone Encounter (Signed)
 Pt daughter has paperwork that needs to be filed out by her PCP, faxed to its designated place and called once everything is completed. CB # [phone] Please advise, Thanks

## 2023-08-16 ENCOUNTER — Telehealth: Payer: Self-pay | Admitting: Internal Medicine

## 2023-08-16 NOTE — Telephone Encounter (Signed)
 Copied from CRM [id number]. Topic: General - Other >> Aug 16, 2023  9:29 AM Theodis Sato wrote: Reason for CRM: Patients daughter is very frustrated that the forms for [employer] have not been faxed yet. Patients daughter states that [employer] will cancel the home health request if the forms are not faxed within this week, and states if Dr. Okey Dupre has any questions regarding information for the form to please call her [phone] as she would be happy to help and expedite this.  Patient daughter also states the front desk at [facility] told his this was already completed but [employer] does not have the forms.  Please fax the forms to [phone]

## 2023-08-16 NOTE — Telephone Encounter (Signed)
 Spoke with patient daughter and she said this form was already sent in but I will fax over again

## 2023-08-17 DIAGNOSIS — L304 Erythema intertrigo: Secondary | ICD-10-CM | POA: Diagnosis not present

## 2023-08-17 DIAGNOSIS — L2389 Allergic contact dermatitis due to other agents: Secondary | ICD-10-CM | POA: Diagnosis not present

## 2023-08-17 NOTE — Telephone Encounter (Signed)
This has already been completed and faxed over.

## 2023-08-19 ENCOUNTER — Other Ambulatory Visit: Payer: Self-pay | Admitting: Internal Medicine

## 2023-08-25 ENCOUNTER — Other Ambulatory Visit: Payer: Self-pay | Admitting: Internal Medicine

## 2023-08-29 ENCOUNTER — Other Ambulatory Visit: Payer: Self-pay | Admitting: Internal Medicine

## 2023-09-06 DIAGNOSIS — Z111 Encounter for screening for respiratory tuberculosis: Secondary | ICD-10-CM | POA: Diagnosis not present

## 2023-09-06 NOTE — Telephone Encounter (Signed)
 Copied from CRM [id number]. Topic: General - Other >> Sep 06, 2023  2:33 PM Truddie Crumble wrote: Reason for CRM: patricia from [facility] called stating she faxed some papers on 3/21 for the patient and she has not received them back CB [phone]

## 2023-09-07 ENCOUNTER — Telehealth: Payer: Self-pay | Admitting: Internal Medicine

## 2023-09-07 NOTE — Telephone Encounter (Signed)
 Copied from CRM [id number]. Topic: General - Other >> Sep 07, 2023 10:07 AM Pascal Lux wrote: Reason for CRM: Patient daughter Larita Fife called, stated she called yesterday and did not receive a call back but wanted to leave the list of things her mother needs before she moves to [facility]  List of things needed for mother before moving to [facility] -Need an order for a Halo safety bed rail  -Need a DNR form and it must be original form not a copy. (will be picked up when ready -Meriel Pica) [phone])  Medication she needs that are not on her list of medication    -Over the counter cough Drops    - Miconazole 2 % POWD [[id number]]    -Lidocaine patches 5%    -Diclofenac sodium topical gel 1%  FL2 Form is needed (faxed to [facility] [phone] attn: Burna Cash)

## 2023-09-07 NOTE — Telephone Encounter (Signed)
 This has been received and its still under review

## 2023-09-07 NOTE — Telephone Encounter (Signed)
 Copied from CRM [id number]. Topic: Clinical - Medical Advice >> Sep 06, 2023  4:29 PM Adaysia C wrote: Reason for CRM: Patient is moving into a senior living home and her daughter would like to discuss some concerns with the provider or providers nurse; please follow up with patients daughter(Lynn Lindajo Royal) [phone]

## 2023-09-08 ENCOUNTER — Telehealth: Payer: Self-pay

## 2023-09-08 DIAGNOSIS — Z111 Encounter for screening for respiratory tuberculosis: Secondary | ICD-10-CM | POA: Diagnosis not present

## 2023-09-08 NOTE — Telephone Encounter (Signed)
 Copied from CRM [id number]. Topic: General - Other >> Sep 08, 2023  9:40 AM Arley Phenix D wrote: Reason for CRM: Burna Cash at [facility] [place] faxed over PCP paperwork to the clinic last Friday and was told to refax it. Elease Hashimoto stated that she needs the paperwork signed and faxed over today so the patient can move into their facility.

## 2023-09-08 NOTE — Telephone Encounter (Signed)
 Please advise paper work take up to 7 business days to be reviewed and signed.

## 2023-09-08 NOTE — Telephone Encounter (Signed)
 I have placed inside office box

## 2023-09-08 NOTE — Telephone Encounter (Signed)
 Forms has been placed inside office

## 2023-09-12 NOTE — Telephone Encounter (Signed)
 Paperwork done and given to daughter in office today

## 2023-09-12 NOTE — Telephone Encounter (Signed)
 Copied from CRM [id number]. Topic: General - Other >> Sep 12, 2023  8:24 AM Almira Coaster wrote: Reason for CRM: [facility] is calling regarding FL2 forms that they need prior to having patient move into their facility. I did advise of the 7 day turnaround time and they would like to know if there is any chance to expedite the process as the forms are the only thing missing to have patient move in. If there are any questions or concerns their best call back number is [phone] .

## 2023-09-16 DIAGNOSIS — F419 Anxiety disorder, unspecified: Secondary | ICD-10-CM | POA: Diagnosis not present

## 2023-09-16 DIAGNOSIS — G301 Alzheimer's disease with late onset: Secondary | ICD-10-CM | POA: Diagnosis not present

## 2023-09-16 DIAGNOSIS — F02C0 Dementia in other diseases classified elsewhere, severe, without behavioral disturbance, psychotic disturbance, mood disturbance, and anxiety: Secondary | ICD-10-CM | POA: Diagnosis not present

## 2023-09-16 DIAGNOSIS — G4700 Insomnia, unspecified: Secondary | ICD-10-CM | POA: Diagnosis not present

## 2023-09-19 DIAGNOSIS — F4323 Adjustment disorder with mixed anxiety and depressed mood: Secondary | ICD-10-CM | POA: Diagnosis not present

## 2023-09-20 DIAGNOSIS — L304 Erythema intertrigo: Secondary | ICD-10-CM | POA: Diagnosis not present

## 2023-09-20 DIAGNOSIS — W19XXXD Unspecified fall, subsequent encounter: Secondary | ICD-10-CM | POA: Diagnosis not present

## 2023-09-20 DIAGNOSIS — M792 Neuralgia and neuritis, unspecified: Secondary | ICD-10-CM | POA: Diagnosis not present

## 2023-09-20 DIAGNOSIS — E7800 Pure hypercholesterolemia, unspecified: Secondary | ICD-10-CM | POA: Diagnosis not present

## 2023-09-20 DIAGNOSIS — K219 Gastro-esophageal reflux disease without esophagitis: Secondary | ICD-10-CM | POA: Diagnosis not present

## 2023-09-20 DIAGNOSIS — N3281 Overactive bladder: Secondary | ICD-10-CM | POA: Diagnosis not present

## 2023-09-20 DIAGNOSIS — S51011D Laceration without foreign body of right elbow, subsequent encounter: Secondary | ICD-10-CM | POA: Diagnosis not present

## 2023-09-20 DIAGNOSIS — F02C0 Dementia in other diseases classified elsewhere, severe, without behavioral disturbance, psychotic disturbance, mood disturbance, and anxiety: Secondary | ICD-10-CM | POA: Diagnosis not present

## 2023-09-20 DIAGNOSIS — E039 Hypothyroidism, unspecified: Secondary | ICD-10-CM | POA: Diagnosis not present

## 2023-09-20 DIAGNOSIS — I10 Essential (primary) hypertension: Secondary | ICD-10-CM | POA: Diagnosis not present

## 2023-09-20 DIAGNOSIS — G301 Alzheimer's disease with late onset: Secondary | ICD-10-CM | POA: Diagnosis not present

## 2023-09-22 DIAGNOSIS — E559 Vitamin D deficiency, unspecified: Secondary | ICD-10-CM | POA: Diagnosis not present

## 2023-09-22 DIAGNOSIS — I10 Essential (primary) hypertension: Secondary | ICD-10-CM | POA: Diagnosis not present

## 2023-09-22 DIAGNOSIS — E039 Hypothyroidism, unspecified: Secondary | ICD-10-CM | POA: Diagnosis not present

## 2023-09-22 DIAGNOSIS — E782 Mixed hyperlipidemia: Secondary | ICD-10-CM | POA: Diagnosis not present

## 2023-09-27 DIAGNOSIS — F419 Anxiety disorder, unspecified: Secondary | ICD-10-CM | POA: Diagnosis not present

## 2023-09-27 DIAGNOSIS — L304 Erythema intertrigo: Secondary | ICD-10-CM | POA: Diagnosis not present

## 2023-09-27 DIAGNOSIS — T424X5A Adverse effect of benzodiazepines, initial encounter: Secondary | ICD-10-CM | POA: Diagnosis not present

## 2023-09-27 DIAGNOSIS — E559 Vitamin D deficiency, unspecified: Secondary | ICD-10-CM | POA: Diagnosis not present

## 2023-09-27 DIAGNOSIS — G629 Polyneuropathy, unspecified: Secondary | ICD-10-CM | POA: Diagnosis not present

## 2023-09-28 DIAGNOSIS — R4182 Altered mental status, unspecified: Secondary | ICD-10-CM | POA: Diagnosis not present

## 2023-10-03 DIAGNOSIS — F4323 Adjustment disorder with mixed anxiety and depressed mood: Secondary | ICD-10-CM | POA: Diagnosis not present

## 2023-10-13 DIAGNOSIS — R262 Difficulty in walking, not elsewhere classified: Secondary | ICD-10-CM | POA: Diagnosis not present

## 2023-10-13 DIAGNOSIS — R2681 Unsteadiness on feet: Secondary | ICD-10-CM | POA: Diagnosis not present

## 2023-10-13 DIAGNOSIS — M6281 Muscle weakness (generalized): Secondary | ICD-10-CM | POA: Diagnosis not present

## 2023-10-13 DIAGNOSIS — Z9181 History of falling: Secondary | ICD-10-CM | POA: Diagnosis not present

## 2023-10-13 DIAGNOSIS — M6259 Muscle wasting and atrophy, not elsewhere classified, multiple sites: Secondary | ICD-10-CM | POA: Diagnosis not present

## 2023-10-17 DIAGNOSIS — R262 Difficulty in walking, not elsewhere classified: Secondary | ICD-10-CM | POA: Diagnosis not present

## 2023-10-17 DIAGNOSIS — M6259 Muscle wasting and atrophy, not elsewhere classified, multiple sites: Secondary | ICD-10-CM | POA: Diagnosis not present

## 2023-10-17 DIAGNOSIS — Z9181 History of falling: Secondary | ICD-10-CM | POA: Diagnosis not present

## 2023-10-17 DIAGNOSIS — R2681 Unsteadiness on feet: Secondary | ICD-10-CM | POA: Diagnosis not present

## 2023-10-17 DIAGNOSIS — F4323 Adjustment disorder with mixed anxiety and depressed mood: Secondary | ICD-10-CM | POA: Diagnosis not present

## 2023-10-17 DIAGNOSIS — M6281 Muscle weakness (generalized): Secondary | ICD-10-CM | POA: Diagnosis not present

## 2023-10-20 DIAGNOSIS — I129 Hypertensive chronic kidney disease with stage 1 through stage 4 chronic kidney disease, or unspecified chronic kidney disease: Secondary | ICD-10-CM | POA: Diagnosis not present

## 2023-10-20 DIAGNOSIS — N183 Chronic kidney disease, stage 3 unspecified: Secondary | ICD-10-CM | POA: Diagnosis not present

## 2023-10-20 DIAGNOSIS — Z9181 History of falling: Secondary | ICD-10-CM | POA: Diagnosis not present

## 2023-10-20 DIAGNOSIS — M25561 Pain in right knee: Secondary | ICD-10-CM | POA: Diagnosis not present

## 2023-10-20 DIAGNOSIS — N390 Urinary tract infection, site not specified: Secondary | ICD-10-CM | POA: Diagnosis not present

## 2023-10-20 DIAGNOSIS — M6281 Muscle weakness (generalized): Secondary | ICD-10-CM | POA: Diagnosis not present

## 2023-10-20 DIAGNOSIS — M6259 Muscle wasting and atrophy, not elsewhere classified, multiple sites: Secondary | ICD-10-CM | POA: Diagnosis not present

## 2023-10-20 DIAGNOSIS — M25562 Pain in left knee: Secondary | ICD-10-CM | POA: Diagnosis not present

## 2023-10-20 DIAGNOSIS — R262 Difficulty in walking, not elsewhere classified: Secondary | ICD-10-CM | POA: Diagnosis not present

## 2023-10-20 DIAGNOSIS — R2681 Unsteadiness on feet: Secondary | ICD-10-CM | POA: Diagnosis not present

## 2023-10-20 DIAGNOSIS — M170 Bilateral primary osteoarthritis of knee: Secondary | ICD-10-CM | POA: Diagnosis not present

## 2023-10-21 DIAGNOSIS — F411 Generalized anxiety disorder: Secondary | ICD-10-CM | POA: Diagnosis not present

## 2023-10-21 DIAGNOSIS — F02C0 Dementia in other diseases classified elsewhere, severe, without behavioral disturbance, psychotic disturbance, mood disturbance, and anxiety: Secondary | ICD-10-CM | POA: Diagnosis not present

## 2023-10-21 DIAGNOSIS — G301 Alzheimer's disease with late onset: Secondary | ICD-10-CM | POA: Diagnosis not present

## 2023-10-21 DIAGNOSIS — G4700 Insomnia, unspecified: Secondary | ICD-10-CM | POA: Diagnosis not present

## 2023-10-24 DIAGNOSIS — R2681 Unsteadiness on feet: Secondary | ICD-10-CM | POA: Diagnosis not present

## 2023-10-24 DIAGNOSIS — M6281 Muscle weakness (generalized): Secondary | ICD-10-CM | POA: Diagnosis not present

## 2023-10-24 DIAGNOSIS — Z9181 History of falling: Secondary | ICD-10-CM | POA: Diagnosis not present

## 2023-10-24 DIAGNOSIS — M6259 Muscle wasting and atrophy, not elsewhere classified, multiple sites: Secondary | ICD-10-CM | POA: Diagnosis not present

## 2023-10-24 DIAGNOSIS — R262 Difficulty in walking, not elsewhere classified: Secondary | ICD-10-CM | POA: Diagnosis not present

## 2023-11-01 DIAGNOSIS — M6259 Muscle wasting and atrophy, not elsewhere classified, multiple sites: Secondary | ICD-10-CM | POA: Diagnosis not present

## 2023-11-01 DIAGNOSIS — R262 Difficulty in walking, not elsewhere classified: Secondary | ICD-10-CM | POA: Diagnosis not present

## 2023-11-01 DIAGNOSIS — G4700 Insomnia, unspecified: Secondary | ICD-10-CM | POA: Diagnosis not present

## 2023-11-01 DIAGNOSIS — R2681 Unsteadiness on feet: Secondary | ICD-10-CM | POA: Diagnosis not present

## 2023-11-01 DIAGNOSIS — M6281 Muscle weakness (generalized): Secondary | ICD-10-CM | POA: Diagnosis not present

## 2023-11-01 DIAGNOSIS — F411 Generalized anxiety disorder: Secondary | ICD-10-CM | POA: Diagnosis not present

## 2023-11-01 DIAGNOSIS — Z9181 History of falling: Secondary | ICD-10-CM | POA: Diagnosis not present

## 2023-11-02 DIAGNOSIS — R262 Difficulty in walking, not elsewhere classified: Secondary | ICD-10-CM | POA: Diagnosis not present

## 2023-11-02 DIAGNOSIS — R2681 Unsteadiness on feet: Secondary | ICD-10-CM | POA: Diagnosis not present

## 2023-11-02 DIAGNOSIS — Z9181 History of falling: Secondary | ICD-10-CM | POA: Diagnosis not present

## 2023-11-02 DIAGNOSIS — M6281 Muscle weakness (generalized): Secondary | ICD-10-CM | POA: Diagnosis not present

## 2023-11-02 DIAGNOSIS — M6259 Muscle wasting and atrophy, not elsewhere classified, multiple sites: Secondary | ICD-10-CM | POA: Diagnosis not present

## 2023-11-08 DIAGNOSIS — M170 Bilateral primary osteoarthritis of knee: Secondary | ICD-10-CM | POA: Diagnosis not present

## 2023-11-08 DIAGNOSIS — G8929 Other chronic pain: Secondary | ICD-10-CM | POA: Diagnosis not present

## 2023-11-08 DIAGNOSIS — G629 Polyneuropathy, unspecified: Secondary | ICD-10-CM | POA: Diagnosis not present

## 2023-11-08 DIAGNOSIS — G4700 Insomnia, unspecified: Secondary | ICD-10-CM | POA: Diagnosis not present

## 2023-11-09 DIAGNOSIS — R262 Difficulty in walking, not elsewhere classified: Secondary | ICD-10-CM | POA: Diagnosis not present

## 2023-11-09 DIAGNOSIS — Z9181 History of falling: Secondary | ICD-10-CM | POA: Diagnosis not present

## 2023-11-09 DIAGNOSIS — M6281 Muscle weakness (generalized): Secondary | ICD-10-CM | POA: Diagnosis not present

## 2023-11-09 DIAGNOSIS — M6259 Muscle wasting and atrophy, not elsewhere classified, multiple sites: Secondary | ICD-10-CM | POA: Diagnosis not present

## 2023-11-09 DIAGNOSIS — R2681 Unsteadiness on feet: Secondary | ICD-10-CM | POA: Diagnosis not present

## 2023-11-10 DIAGNOSIS — M792 Neuralgia and neuritis, unspecified: Secondary | ICD-10-CM | POA: Diagnosis not present

## 2023-11-10 DIAGNOSIS — E7800 Pure hypercholesterolemia, unspecified: Secondary | ICD-10-CM | POA: Diagnosis not present

## 2023-11-11 DIAGNOSIS — G4700 Insomnia, unspecified: Secondary | ICD-10-CM | POA: Diagnosis not present

## 2023-11-11 DIAGNOSIS — F02C0 Dementia in other diseases classified elsewhere, severe, without behavioral disturbance, psychotic disturbance, mood disturbance, and anxiety: Secondary | ICD-10-CM | POA: Diagnosis not present

## 2023-11-11 DIAGNOSIS — G301 Alzheimer's disease with late onset: Secondary | ICD-10-CM | POA: Diagnosis not present

## 2023-11-11 DIAGNOSIS — F411 Generalized anxiety disorder: Secondary | ICD-10-CM | POA: Diagnosis not present

## 2023-11-14 DIAGNOSIS — Z9181 History of falling: Secondary | ICD-10-CM | POA: Diagnosis not present

## 2023-11-14 DIAGNOSIS — M6259 Muscle wasting and atrophy, not elsewhere classified, multiple sites: Secondary | ICD-10-CM | POA: Diagnosis not present

## 2023-11-14 DIAGNOSIS — R262 Difficulty in walking, not elsewhere classified: Secondary | ICD-10-CM | POA: Diagnosis not present

## 2023-11-14 DIAGNOSIS — F4323 Adjustment disorder with mixed anxiety and depressed mood: Secondary | ICD-10-CM | POA: Diagnosis not present

## 2023-11-14 DIAGNOSIS — R2681 Unsteadiness on feet: Secondary | ICD-10-CM | POA: Diagnosis not present

## 2023-11-14 DIAGNOSIS — M6281 Muscle weakness (generalized): Secondary | ICD-10-CM | POA: Diagnosis not present

## 2023-11-17 DIAGNOSIS — E039 Hypothyroidism, unspecified: Secondary | ICD-10-CM | POA: Diagnosis not present

## 2023-11-17 DIAGNOSIS — F02C0 Dementia in other diseases classified elsewhere, severe, without behavioral disturbance, psychotic disturbance, mood disturbance, and anxiety: Secondary | ICD-10-CM | POA: Diagnosis not present

## 2023-11-17 DIAGNOSIS — K219 Gastro-esophageal reflux disease without esophagitis: Secondary | ICD-10-CM | POA: Diagnosis not present

## 2023-11-17 DIAGNOSIS — G4700 Insomnia, unspecified: Secondary | ICD-10-CM | POA: Diagnosis not present

## 2023-11-17 DIAGNOSIS — G301 Alzheimer's disease with late onset: Secondary | ICD-10-CM | POA: Diagnosis not present

## 2023-11-21 DIAGNOSIS — Z9181 History of falling: Secondary | ICD-10-CM | POA: Diagnosis not present

## 2023-11-21 DIAGNOSIS — M6259 Muscle wasting and atrophy, not elsewhere classified, multiple sites: Secondary | ICD-10-CM | POA: Diagnosis not present

## 2023-11-21 DIAGNOSIS — R2681 Unsteadiness on feet: Secondary | ICD-10-CM | POA: Diagnosis not present

## 2023-11-21 DIAGNOSIS — R262 Difficulty in walking, not elsewhere classified: Secondary | ICD-10-CM | POA: Diagnosis not present

## 2023-11-21 DIAGNOSIS — M6281 Muscle weakness (generalized): Secondary | ICD-10-CM | POA: Diagnosis not present

## 2023-11-23 DIAGNOSIS — R262 Difficulty in walking, not elsewhere classified: Secondary | ICD-10-CM | POA: Diagnosis not present

## 2023-11-23 DIAGNOSIS — M6259 Muscle wasting and atrophy, not elsewhere classified, multiple sites: Secondary | ICD-10-CM | POA: Diagnosis not present

## 2023-11-23 DIAGNOSIS — R2681 Unsteadiness on feet: Secondary | ICD-10-CM | POA: Diagnosis not present

## 2023-11-23 DIAGNOSIS — M6281 Muscle weakness (generalized): Secondary | ICD-10-CM | POA: Diagnosis not present

## 2023-11-23 DIAGNOSIS — Z9181 History of falling: Secondary | ICD-10-CM | POA: Diagnosis not present

## 2023-11-28 DIAGNOSIS — M6281 Muscle weakness (generalized): Secondary | ICD-10-CM | POA: Diagnosis not present

## 2023-11-28 DIAGNOSIS — R2681 Unsteadiness on feet: Secondary | ICD-10-CM | POA: Diagnosis not present

## 2023-11-28 DIAGNOSIS — F4323 Adjustment disorder with mixed anxiety and depressed mood: Secondary | ICD-10-CM | POA: Diagnosis not present

## 2023-11-28 DIAGNOSIS — Z9181 History of falling: Secondary | ICD-10-CM | POA: Diagnosis not present

## 2023-11-28 DIAGNOSIS — R262 Difficulty in walking, not elsewhere classified: Secondary | ICD-10-CM | POA: Diagnosis not present

## 2023-11-28 DIAGNOSIS — M6259 Muscle wasting and atrophy, not elsewhere classified, multiple sites: Secondary | ICD-10-CM | POA: Diagnosis not present

## 2023-12-01 DIAGNOSIS — R2681 Unsteadiness on feet: Secondary | ICD-10-CM | POA: Diagnosis not present

## 2023-12-01 DIAGNOSIS — Z9181 History of falling: Secondary | ICD-10-CM | POA: Diagnosis not present

## 2023-12-01 DIAGNOSIS — M6281 Muscle weakness (generalized): Secondary | ICD-10-CM | POA: Diagnosis not present

## 2023-12-01 DIAGNOSIS — R262 Difficulty in walking, not elsewhere classified: Secondary | ICD-10-CM | POA: Diagnosis not present

## 2023-12-01 DIAGNOSIS — M6259 Muscle wasting and atrophy, not elsewhere classified, multiple sites: Secondary | ICD-10-CM | POA: Diagnosis not present

## 2023-12-09 DIAGNOSIS — F411 Generalized anxiety disorder: Secondary | ICD-10-CM | POA: Diagnosis not present

## 2023-12-09 DIAGNOSIS — G301 Alzheimer's disease with late onset: Secondary | ICD-10-CM | POA: Diagnosis not present

## 2023-12-09 DIAGNOSIS — G4700 Insomnia, unspecified: Secondary | ICD-10-CM | POA: Diagnosis not present

## 2023-12-09 DIAGNOSIS — F02C0 Dementia in other diseases classified elsewhere, severe, without behavioral disturbance, psychotic disturbance, mood disturbance, and anxiety: Secondary | ICD-10-CM | POA: Diagnosis not present

## 2023-12-12 DIAGNOSIS — F4323 Adjustment disorder with mixed anxiety and depressed mood: Secondary | ICD-10-CM | POA: Diagnosis not present

## 2023-12-12 DIAGNOSIS — E7800 Pure hypercholesterolemia, unspecified: Secondary | ICD-10-CM | POA: Diagnosis not present

## 2023-12-12 DIAGNOSIS — I10 Essential (primary) hypertension: Secondary | ICD-10-CM | POA: Diagnosis not present

## 2023-12-13 DIAGNOSIS — F411 Generalized anxiety disorder: Secondary | ICD-10-CM | POA: Diagnosis not present

## 2023-12-13 DIAGNOSIS — N183 Chronic kidney disease, stage 3 unspecified: Secondary | ICD-10-CM | POA: Diagnosis not present

## 2023-12-13 DIAGNOSIS — G4700 Insomnia, unspecified: Secondary | ICD-10-CM | POA: Diagnosis not present

## 2023-12-13 DIAGNOSIS — I129 Hypertensive chronic kidney disease with stage 1 through stage 4 chronic kidney disease, or unspecified chronic kidney disease: Secondary | ICD-10-CM | POA: Diagnosis not present

## 2023-12-15 ENCOUNTER — Telehealth: Payer: Self-pay | Admitting: Internal Medicine

## 2023-12-15 NOTE — Telephone Encounter (Signed)
 Contacted Deanna Olson to schedule their annual wellness visit. Patient declined to schedule AWV at this time.Transferred care to a doctor in her assistant living.  [facility] Care Guide [facility] AWV TEAM Direct Dial: [phone]

## 2023-12-16 ENCOUNTER — Other Ambulatory Visit: Payer: Self-pay

## 2023-12-16 ENCOUNTER — Emergency Department (HOSPITAL_COMMUNITY)

## 2023-12-16 ENCOUNTER — Encounter (HOSPITAL_COMMUNITY): Payer: Self-pay

## 2023-12-16 ENCOUNTER — Inpatient Hospital Stay (HOSPITAL_COMMUNITY)
Admission: EM | Admit: 2023-12-16 | Discharge: 2023-12-20 | DRG: 543 | Disposition: A | Discharge status: Skilled Nursing Facility | Source: Skilled Nursing Facility | Attending: Family Medicine | Admitting: Family Medicine

## 2023-12-16 DIAGNOSIS — F0284 Dementia in other diseases classified elsewhere, unspecified severity, with anxiety: Secondary | ICD-10-CM | POA: Diagnosis present

## 2023-12-16 DIAGNOSIS — M419 Scoliosis, unspecified: Secondary | ICD-10-CM | POA: Diagnosis not present

## 2023-12-16 DIAGNOSIS — M545 Low back pain, unspecified: Secondary | ICD-10-CM | POA: Diagnosis not present

## 2023-12-16 DIAGNOSIS — I10 Essential (primary) hypertension: Secondary | ICD-10-CM | POA: Diagnosis present

## 2023-12-16 DIAGNOSIS — Z823 Family history of stroke: Secondary | ICD-10-CM

## 2023-12-16 DIAGNOSIS — Z66 Do not resuscitate: Secondary | ICD-10-CM | POA: Diagnosis present

## 2023-12-16 DIAGNOSIS — Z833 Family history of diabetes mellitus: Secondary | ICD-10-CM

## 2023-12-16 DIAGNOSIS — E039 Hypothyroidism, unspecified: Secondary | ICD-10-CM | POA: Diagnosis present

## 2023-12-16 DIAGNOSIS — M1990 Unspecified osteoarthritis, unspecified site: Secondary | ICD-10-CM | POA: Diagnosis present

## 2023-12-16 DIAGNOSIS — S32010A Wedge compression fracture of first lumbar vertebra, initial encounter for closed fracture: Secondary | ICD-10-CM | POA: Diagnosis not present

## 2023-12-16 DIAGNOSIS — F0280 Dementia in other diseases classified elsewhere without behavioral disturbance: Secondary | ICD-10-CM | POA: Diagnosis present

## 2023-12-16 DIAGNOSIS — M549 Dorsalgia, unspecified: Secondary | ICD-10-CM | POA: Diagnosis present

## 2023-12-16 DIAGNOSIS — M47816 Spondylosis without myelopathy or radiculopathy, lumbar region: Secondary | ICD-10-CM | POA: Diagnosis not present

## 2023-12-16 DIAGNOSIS — Z79899 Other long term (current) drug therapy: Secondary | ICD-10-CM

## 2023-12-16 DIAGNOSIS — M4856XA Collapsed vertebra, not elsewhere classified, lumbar region, initial encounter for fracture: Principal | ICD-10-CM | POA: Diagnosis present

## 2023-12-16 DIAGNOSIS — M47814 Spondylosis without myelopathy or radiculopathy, thoracic region: Secondary | ICD-10-CM | POA: Diagnosis not present

## 2023-12-16 DIAGNOSIS — G8929 Other chronic pain: Secondary | ICD-10-CM | POA: Diagnosis present

## 2023-12-16 DIAGNOSIS — Z9842 Cataract extraction status, left eye: Secondary | ICD-10-CM

## 2023-12-16 DIAGNOSIS — R32 Unspecified urinary incontinence: Secondary | ICD-10-CM | POA: Diagnosis present

## 2023-12-16 DIAGNOSIS — R0902 Hypoxemia: Secondary | ICD-10-CM | POA: Diagnosis not present

## 2023-12-16 DIAGNOSIS — Z888 Allergy status to other drugs, medicaments and biological substances status: Secondary | ICD-10-CM

## 2023-12-16 DIAGNOSIS — Z9841 Cataract extraction status, right eye: Secondary | ICD-10-CM

## 2023-12-16 DIAGNOSIS — F411 Generalized anxiety disorder: Secondary | ICD-10-CM | POA: Diagnosis present

## 2023-12-16 DIAGNOSIS — E785 Hyperlipidemia, unspecified: Secondary | ICD-10-CM | POA: Diagnosis present

## 2023-12-16 DIAGNOSIS — I700 Atherosclerosis of aorta: Secondary | ICD-10-CM | POA: Diagnosis not present

## 2023-12-16 DIAGNOSIS — S32000A Wedge compression fracture of unspecified lumbar vertebra, initial encounter for closed fracture: Secondary | ICD-10-CM | POA: Diagnosis present

## 2023-12-16 DIAGNOSIS — Z9181 History of falling: Secondary | ICD-10-CM

## 2023-12-16 DIAGNOSIS — K219 Gastro-esophageal reflux disease without esophagitis: Secondary | ICD-10-CM | POA: Diagnosis present

## 2023-12-16 DIAGNOSIS — G629 Polyneuropathy, unspecified: Secondary | ICD-10-CM | POA: Diagnosis present

## 2023-12-16 DIAGNOSIS — M5134 Other intervertebral disc degeneration, thoracic region: Secondary | ICD-10-CM | POA: Diagnosis not present

## 2023-12-16 DIAGNOSIS — Z79890 Hormone replacement therapy (postmenopausal): Secondary | ICD-10-CM

## 2023-12-16 DIAGNOSIS — G309 Alzheimer's disease, unspecified: Secondary | ICD-10-CM | POA: Diagnosis present

## 2023-12-16 DIAGNOSIS — Z8249 Family history of ischemic heart disease and other diseases of the circulatory system: Secondary | ICD-10-CM

## 2023-12-16 DIAGNOSIS — S32009A Unspecified fracture of unspecified lumbar vertebra, initial encounter for closed fracture: Secondary | ICD-10-CM | POA: Diagnosis not present

## 2023-12-16 HISTORY — DX: Presence of automatic (implantable) cardiac defibrillator: Z95.810

## 2023-12-16 LAB — URINALYSIS, ROUTINE W REFLEX MICROSCOPIC
Bilirubin Urine: NEGATIVE
Glucose, UA: NEGATIVE mg/dL
Hgb urine dipstick: NEGATIVE
Ketones, ur: NEGATIVE mg/dL
Leukocytes,Ua: NEGATIVE
Nitrite: NEGATIVE
Protein, ur: NEGATIVE mg/dL
Specific Gravity, Urine: 1.008 (ref 1.005–1.030)
pH: 5 (ref 5.0–8.0)

## 2023-12-16 LAB — CBC WITH DIFFERENTIAL/PLATELET
Abs Immature Granulocytes: 0.11 K/uL — ABNORMAL HIGH (ref 0.00–0.07)
Basophils Absolute: 0.1 K/uL (ref 0.0–0.1)
Basophils Relative: 1 %
Eosinophils Absolute: 0.3 K/uL (ref 0.0–0.5)
Eosinophils Relative: 3 %
HCT: 39.4 % (ref 36.0–46.0)
Hemoglobin: 12.4 g/dL (ref 12.0–15.0)
Immature Granulocytes: 1 %
Lymphocytes Relative: 20 %
Lymphs Abs: 2.1 K/uL (ref 0.7–4.0)
MCH: 30.1 pg (ref 26.0–34.0)
MCHC: 31.5 g/dL (ref 30.0–36.0)
MCV: 95.6 fL (ref 80.0–100.0)
Monocytes Absolute: 1.2 K/uL — ABNORMAL HIGH (ref 0.1–1.0)
Monocytes Relative: 11 %
Neutro Abs: 6.7 K/uL (ref 1.7–7.7)
Neutrophils Relative %: 64 %
Platelets: 162 K/uL (ref 150–400)
RBC: 4.12 MIL/uL (ref 3.87–5.11)
RDW: 15.4 % (ref 11.5–15.5)
WBC: 10.5 K/uL (ref 4.0–10.5)
nRBC: 0 % (ref 0.0–0.2)

## 2023-12-16 LAB — COMPREHENSIVE METABOLIC PANEL WITH GFR
ALT: 10 U/L (ref 0–44)
AST: 28 U/L (ref 15–41)
Albumin: 3.6 g/dL (ref 3.5–5.0)
Alkaline Phosphatase: 41 U/L (ref 38–126)
Anion gap: 10 (ref 5–15)
BUN: 19 mg/dL (ref 8–23)
CO2: 23 mmol/L (ref 22–32)
Calcium: 9.1 mg/dL (ref 8.9–10.3)
Chloride: 105 mmol/L (ref 98–111)
Creatinine, Ser: 0.9 mg/dL (ref 0.44–1.00)
GFR, Estimated: 59 mL/min — ABNORMAL LOW (ref >60)
Glucose, Bld: 94 mg/dL (ref 70–99)
Potassium: 4.6 mmol/L (ref 3.5–5.1)
Sodium: 138 mmol/L (ref 135–145)
Total Bilirubin: 1 mg/dL (ref 0.0–1.2)
Total Protein: 6.3 g/dL — ABNORMAL LOW (ref 6.5–8.1)

## 2023-12-16 MED ORDER — ALPRAZOLAM 0.5 MG PO TABS
0.5000 mg | ORAL_TABLET | Freq: Once | ORAL | Status: AC
  Administered 2023-12-16: 0.5 mg via ORAL
  Filled 2023-12-16: qty 1

## 2023-12-16 MED ORDER — OXYCODONE HCL 5 MG PO TABS
5.0000 mg | ORAL_TABLET | ORAL | Status: AC
  Administered 2023-12-16: 5 mg via ORAL
  Filled 2023-12-16: qty 1

## 2023-12-16 MED ORDER — ONDANSETRON HCL 4 MG PO TABS
4.0000 mg | ORAL_TABLET | Freq: Four times a day (QID) | ORAL | Status: DC | PRN
Start: 2023-12-16 — End: 2023-12-20

## 2023-12-16 MED ORDER — ACETAMINOPHEN 325 MG PO TABS
650.0000 mg | ORAL_TABLET | Freq: Four times a day (QID) | ORAL | Status: DC | PRN
  Administered 2023-12-17 – 2023-12-19 (×3): 650 mg via ORAL
  Filled 2023-12-16 (×3): qty 2

## 2023-12-16 MED ORDER — ALBUTEROL SULFATE (2.5 MG/3ML) 0.083% IN NEBU: 2.5000 mg | INHALATION_SOLUTION | RESPIRATORY_TRACT | Status: DC | PRN

## 2023-12-16 MED ORDER — ONDANSETRON HCL 4 MG/2ML IJ SOLN
4.0000 mg | Freq: Four times a day (QID) | INTRAMUSCULAR | Status: DC | PRN
Start: 2023-12-16 — End: 2023-12-20

## 2023-12-16 MED ORDER — ACETAMINOPHEN 650 MG RE SUPP: 650.0000 mg | Freq: Four times a day (QID) | RECTAL | Status: DC | PRN

## 2023-12-16 MED ORDER — OXYCODONE HCL 5 MG PO TABS
5.0000 mg | ORAL_TABLET | ORAL | Status: DC | PRN
  Administered 2023-12-16 – 2023-12-19 (×8): 5 mg via ORAL
  Filled 2023-12-16 (×8): qty 1

## 2023-12-16 MED ORDER — HEPARIN SODIUM (PORCINE) 5000 UNIT/ML IJ SOLN
5000.0000 [IU] | Freq: Three times a day (TID) | INTRAMUSCULAR | Status: DC
  Administered 2023-12-16 – 2023-12-20 (×13): 5000 [IU] via SUBCUTANEOUS
  Filled 2023-12-16 (×13): qty 1

## 2023-12-16 NOTE — Progress Notes (Signed)
 Orthopedic Tech Progress Note Patient Details:  Deanna Olson 27-May-1926 [id number]  Ortho Devices Type of Ortho Device: Thoracolumbar corset (TLSO) Ortho Device/Splint Interventions: Ordered      Laymon [name] 12/16/2023, 3:40 PM

## 2023-12-16 NOTE — ED Triage Notes (Signed)
 Pt to ED via [facility] from [facility] c/o back pain x 3 days, progressively getting worse. Reports pain is worse in lower back. Painful for pt to stand.  No known injury.   DNR with pt.   Last VS: 170/70, P 64, 94%RA, RR16   No medications given by EMS.

## 2023-12-16 NOTE — ED Notes (Signed)
 ED TO INPATIENT HANDOFF REPORT  ED Nurse Name and Phone #: Olen PEAK [phone]  S Name/Age/Gender Deanna Olson 89 y.o. female Room/Bed: WA17/WA17  Code Status   Code Status: Prior  Home/SNF/Other Skilled nursing facility Patient oriented to: self, place, time, and situation Is this baseline? Yes   Triage Complete: Triage complete  Chief Complaint back pain  Triage Note Pt to ED via [facility] from [facility] c/o back pain x 3 days, progressively getting worse. Reports pain is worse in lower back. Painful for pt to stand.  No known injury.   DNR with pt.   Last VS: 170/70, P 64, 94%RA, RR16   No medications given by EMS.    Allergies Allergies  Allergen Reactions   Sulfonamide Derivatives Swelling and Other (See Comments)    Angioedema     Level of Care/Admitting Diagnosis ED Disposition     ED Disposition  Admit   Condition  --   Comment  The patient appears reasonably stabilized for admission considering the current resources, flow, and capabilities available in the ED at this time, and I doubt any other [facility] requiring further screening and/or treatment in the ED prior to admission is  present.          B Medical/Surgery History Past Medical History:  Diagnosis Date   Allergic rhinitis, cause unspecified    Anxiety state, unspecified    Aortic valve disorders    Mild AS 2013 echo    Eczema    Esophageal reflux    Herpes zoster without mention of complication    Hyperlipidemia    Hypertension    Hypothyroid    Osteoarthrosis, unspecified whether generalized or localized, unspecified site    Unspecified venous (peripheral) insufficiency    Past Surgical History:  Procedure Laterality Date   CATARACT EXTRACTION, BILATERAL  2013   KYPHOPLASTY  02/2018   VESICOVAGINAL FISTULA CLOSURE W/ TAH       A IV Location/Drains/Wounds Patient Lines/Drains/Airways Status     Active Line/Drains/Airways     Name Placement date Placement time Site  Days   Peripheral IV 12/16/23 20 G 1 Left Antecubital 12/16/23  1240  Antecubital  less than 1            Intake/Output Last 24 hours No intake or output data in the 24 hours ending 12/16/23 1608  Labs/Imaging Results for orders placed or performed during the hospital encounter of 12/16/23 (from the past 48 hours)  CBC with Differential     Status: Abnormal   Collection Time: 12/16/23 12:32 PM  Result Value Ref Range   WBC 10.5 4.0 - 10.5 K/uL   RBC 4.12 3.87 - 5.11 MIL/uL   Hemoglobin 12.4 12.0 - 15.0 g/dL   HCT 60.5 63.9 - 53.9 %   MCV 95.6 80.0 - 100.0 fL   MCH 30.1 26.0 - 34.0 pg   MCHC 31.5 30.0 - 36.0 g/dL   RDW 84.5 88.4 - 84.4 %   Platelets 162 150 - 400 K/uL   nRBC 0.0 0.0 - 0.2 %   Neutrophils Relative % 64 %   Neutro Abs 6.7 1.7 - 7.7 K/uL   Lymphocytes Relative 20 %   Lymphs Abs 2.1 0.7 - 4.0 K/uL   Monocytes Relative 11 %   Monocytes Absolute 1.2 (H) 0.1 - 1.0 K/uL   Eosinophils Relative 3 %   Eosinophils Absolute 0.3 0.0 - 0.5 K/uL   Basophils Relative 1 %   Basophils Absolute 0.1 0.0 -  0.1 K/uL   Immature Granulocytes 1 %   Abs Immature Granulocytes 0.11 (H) 0.00 - 0.07 K/uL    Comment: Performed at [facility], [address] [place], [place] [address]  Comprehensive metabolic panel     Status: Abnormal   Collection Time: 12/16/23 12:32 PM  Result Value Ref Range   Sodium 138 135 - 145 mmol/L   Potassium 4.6 3.5 - 5.1 mmol/L    Comment: HEMOLYSIS AT THIS LEVEL MAY AFFECT RESULT   Chloride 105 98 - 111 mmol/L   CO2 23 22 - 32 mmol/L   Glucose, Bld 94 70 - 99 mg/dL    Comment: Glucose reference range applies only to samples taken after fasting for at least 8 hours.   BUN 19 8 - 23 mg/dL   Creatinine, Ser 9.09 0.44 - 1.00 mg/dL   Calcium  9.1 8.9 - 10.3 mg/dL   Total Protein 6.3 (L) 6.5 - 8.1 g/dL   Albumin 3.6 3.5 - 5.0 g/dL   AST 28 15 - 41 U/L    Comment: HEMOLYSIS AT THIS LEVEL MAY AFFECT RESULT   ALT 10 0 - 44 U/L     Comment: HEMOLYSIS AT THIS LEVEL MAY AFFECT RESULT   Alkaline Phosphatase 41 38 - 126 U/L   Total Bilirubin 1.0 0.0 - 1.2 mg/dL    Comment: HEMOLYSIS AT THIS LEVEL MAY AFFECT RESULT   GFR, Estimated 59 (L) >60 mL/min    Comment: (NOTE) Calculated using the CKD-EPI Creatinine Equation (2021)    Anion gap 10 5 - 15    Comment: Performed at [facility], [address], [place], [place] [address]  Urinalysis, Routine w reflex microscopic -Urine, Clean Catch     Status: Abnormal   Collection Time: 12/16/23 12:32 PM  Result Value Ref Range   Color, Urine STRAW (A) YELLOW   APPearance CLEAR CLEAR   Specific Gravity, Urine 1.008 1.005 - 1.030   pH 5.0 5.0 - 8.0   Glucose, UA NEGATIVE NEGATIVE mg/dL   Hgb urine dipstick NEGATIVE NEGATIVE   Bilirubin Urine NEGATIVE NEGATIVE   Ketones, ur NEGATIVE NEGATIVE mg/dL   Protein, ur NEGATIVE NEGATIVE mg/dL   Nitrite NEGATIVE NEGATIVE   Leukocytes,Ua NEGATIVE NEGATIVE    Comment: Performed at [facility], [address] [place], [place] [address]   CT PELVIS WO CONTRAST Result Date: 12/16/2023 CLINICAL DATA:  Low back pain EXAM: CT PELVIS WITHOUT CONTRAST TECHNIQUE: Multidetector CT imaging of the pelvis was performed following the standard protocol without intravenous contrast. RADIATION DOSE REDUCTION: This exam was performed according to the departmental dose-optimization program which includes automated exposure control, adjustment of the mA and/or kV according to patient size and/or use of iterative reconstruction technique. COMPARISON:  Pelvic radiograph 12/30/2014 FINDINGS: Urinary Tract:  No abnormality visualized. Bowel:  Unremarkable visualized pelvic bowel loops. Vascular/Lymphatic: Aortic atherosclerosis. No aneurysm. No suspicious lymph node Reproductive:  Hysterectomy.  No adnexal mass Other:  Negative for pelvic effusion or free air. Musculoskeletal: No acute osseous abnormality. Mild SI joint  degenerative change and mild bilateral no moral acetabular degenerative change. Pubic symphysis and rami appear intact IMPRESSION: 1. No acute osseous abnormality. Mild degenerative changes. 2. Aortic atherosclerosis. Aortic Atherosclerosis (ICD10-I70.0). Electronically Signed   By: Luke Bun M.D.   On: 12/16/2023 14:42   CT Lumbar Spine Wo Contrast Result Date: 12/16/2023 CLINICAL DATA:  Worsening back pain EXAM: CT LUMBAR SPINE WITHOUT CONTRAST TECHNIQUE: Multidetector CT imaging of the lumbar spine  was performed without intravenous contrast administration. Multiplanar CT image reconstructions were also generated. RADIATION DOSE REDUCTION: This exam was performed according to the departmental dose-optimization program which includes automated exposure control, adjustment of the mA and/or kV according to patient size and/or use of iterative reconstruction technique. COMPARISON:  Radiograph 04/22/2023, CT 05/23/2023 FINDINGS: Segmentation: 5 lumbar type vertebrae. Alignment: Mild levoscoliosis.  Mild kyphosis of the lumbar spine. Vertebrae: Post augmentation changes at L3 and L4 with chronic compression deformities. Interval acute appearing fracture involving the superior aspect of L1 vertebral body with sli linear lucency present. Slight depression of the superior endplate with less than 20% loss of vertebral body height centrally. Minimal 2 mm retropulsion of the upper vertebral body. No significant bony canal stenosis. Paraspinal and other soft tissues: Mild paravertebral edema at L1. Aortic atherosclerosis. Left renal cyst, no specific imaging follow-up is recommended. Disc levels: Multilevel vacuum disc. Multilevel hypertrophic facet degenerative changes. No high-grade canal stenosis or high-grade foraminal narrowing. IMPRESSION: 1. Interval acute appearing fracture involving the superior aspect of L1 vertebral body with slight depression of the superior endplate and less than 20% loss of vertebral body  height centrally. Minimal 2 mm retropulsion of the upper vertebral body. No significant bony canal stenosis. 2. Post augmentation changes at L3 and L4 with chronic compression deformities. 3. Aortic atherosclerosis. Aortic Atherosclerosis (ICD10-I70.0). Electronically Signed   By: Luke Bun M.D.   On: 12/16/2023 14:38   CT Thoracic Spine Wo Contrast Result Date: 12/16/2023 CLINICAL DATA:  Worsening back pain EXAM: CT THORACIC SPINE WITHOUT CONTRAST TECHNIQUE: Multidetector CT images of the thoracic were obtained using the standard protocol without intravenous contrast. RADIATION DOSE REDUCTION: This exam was performed according to the departmental dose-optimization program which includes automated exposure control, adjustment of the mA and/or kV according to patient size and/or use of iterative reconstruction technique. COMPARISON:  Chest CT 04/28/2022 FINDINGS: Alignment: Normal. Vertebrae: No acute fracture or focal pathologic process. Paraspinal and other soft tissues: No acute paravertebral or paraspinal soft tissue abnormality. Aortic atherosclerosis. Disc levels: Multilevel degenerative osteophytes. Mild calcification of posterior longitudinal ligament at T7-T8. IMPRESSION: 1. No CT evidence for acute osseous abnormality. 2. Multilevel degenerative changes. 3. Aortic atherosclerosis. Aortic Atherosclerosis (ICD10-I70.0). Electronically Signed   By: Luke Bun M.D.   On: 12/16/2023 14:23    Pending Labs Unresulted Labs (From admission, onward)    None       Vitals/Pain Today's Vitals   12/16/23 1247 12/16/23 1250 12/16/23 1325 12/16/23 1508  BP:    (!) 148/62  Pulse:    68  Resp:    16  Temp:    (!) 97.4 F (36.3 C)  TempSrc:    Oral  SpO2:    95%  Weight:      Height:      PainSc: 10-Worst pain ever 10-Worst pain ever Asleep     Isolation Precautions No active isolations  Medications Medications  oxyCODONE  (Oxy IR/ROXICODONE ) immediate release tablet 5 mg (5 mg Oral  Given 12/16/23 1247)    Mobility walks     Focused Assessments Neuro Assessment Handoff:  Swallow screen pass? Yes          Neuro Assessment:   Neuro Checks:      Has TPA been given? No If patient is a Neuro Trauma and patient is going to OR before floor call report to 4N Charge nurse: [phone] or [phone]   R Recommendations: See Admitting Provider Note  Report given to:   Additional Notes:  DNR

## 2023-12-16 NOTE — ED Provider Notes (Signed)
 [place] EMERGENCY DEPARTMENT AT [facility] Provider Note   CSN: [id number] Arrival date & time: 12/16/23  1139     Patient presents with: Back Pain   Deanna Olson is a 89 y.o. female with dementia, general anxiety disorder, hypertension, chronic back pain presents emerged from today for evaluation of worsening back pain for the past week but worsening the past few days.  She denies any fall or new trauma to the area.  She reports that hurts from her shoulders down into her buttocks on both sides.  She denies any numbness or tingling.  Patient does have some baseline stress incontinence with urination but no change to this.  No fecal incontinence.  Denies any fevers or any IV drug use ever.  She denies any saddle anesthesia.  She denies any dysuria or hematuria.  She denies any numbness or tingling into her lower extremities or any weakness that she feels.  She denies any chest pain or shortness of breath.  Denies any neck pain or headaches.  Daughter is at bedside and reports that she is at her baseline.   Back Pain Associated symptoms: no chest pain, no dysuria, no fever, no numbness and no weakness        Prior to Admission medications   Medication Sig Start Date End Date Taking? Authorizing Provider  albuterol  (VENTOLIN  HFA) 108 (90 Base) MCG/ACT inhaler Inhale 1-2 puffs into the lungs every 6 (six) hours as needed for wheezing or shortness of breath.    [provider]  ALPRAZolam  (XANAX ) 0.5 MG tablet TAKE 1 TABLET BY MOUTH 2 TIMES A DAY 08/22/23   Rollene Almarie LABOR, MD  Apoaequorin (PREVAGEN PO) Take 1 capsule by mouth daily after breakfast.    [provider]  Ascorbic Acid (VITAMIN C PO) Take 1 tablet by mouth daily after breakfast.    [provider]  b complex vitamins capsule Take 1 capsule by mouth daily after breakfast.    [provider]  betamethasone  dipropionate 0.05 % lotion APPLY TOPICALLY DAILY AS NEEDED USES ON SCALP  AND EARS Patient taking differently: Apply 1 application  topically daily as needed (for irritation- behind the ears and scalp). 12/22/22   Rollene Almarie LABOR, MD  Boswellia-Glucosamine-Vit D (OSTEO BI-FLEX ONE PER DAY) TABS Take 1 tablet by mouth daily after breakfast.    [provider]  cephALEXin  (KEFLEX ) 500 MG capsule TAKE 1 CAPSULE BY MOUTH THREE TIMES A DAY FOR 7 DAYS 08/26/23   Rollene Almarie LABOR, MD  Cholecalciferol (VITAMIN D -3 PO) Take 1 capsule by mouth daily after breakfast.    [provider]  fluconazole  (DIFLUCAN ) 150 MG tablet Take 1 tablet (150 mg total) by mouth every 3 (three) days. 06/27/23   Rollene Almarie LABOR, MD  gabapentin  (NEURONTIN ) 100 MG capsule TAKE 1 CAPSULE BY MOUTH EVERY MORNING 06/27/23   Rollene Almarie LABOR, MD  gabapentin  (NEURONTIN ) 300 MG capsule TAKE 1 CAPSULE BY MOUTH EVERY NIGHT AT BEDTIME 06/06/23   Rollene Almarie LABOR, MD  guaiFENesin -dextromethorphan (ROBITUSSIN DM) 100-10 MG/5ML syrup Take 5 mLs by mouth every 4 (four) hours as needed for cough. 10/30/21   Norleen Lynwood ORN, MD  ketoconazole  (NIZORAL ) 2 % cream APPLY ONE APPLICATION TOPICALLY DAILY 08/26/23   Rollene Almarie LABOR, MD  ketoconazole  (NIZORAL ) 2 % shampoo APPLY TO AFFECTED AREA EVERY FRIDAY 08/30/23   Rollene Almarie LABOR, MD  levothyroxine  (SYNTHROID ) 50 MCG tablet TAKE 1 TABLET BY MOUTH DAILY BEFORE BREAKFAST 04/11/23   Rollene,  Almarie LABOR, MD  losartan  (COZAAR ) 50 MG tablet TAKE 1 TABLET BY MOUTH DAILY 08/08/23   Rollene Almarie LABOR, MD  miconazole  (MICATIN) 2 % cream Apply 1 Application topically 2 (two) times daily. 06/27/23   Rollene Almarie LABOR, MD  Miconazole  2 % POWD Apply 1 each topically in the morning and at bedtime. 06/27/23   Rollene Almarie LABOR, MD  mirabegron  ER (MYRBETRIQ ) 50 MG TB24 tablet TAKE 1 TABLET BY MOUTH DAILY **NEED APPOINTMENT FOR REFILLS** 08/02/23   Rollene Almarie LABOR, MD  Multiple Minerals-Vitamins (CAL MAG ZINC +D3 PO) Take 1  tablet by mouth daily after breakfast.    [provider]  Multiple Vitamins-Minerals (PRESERVISION AREDS 2) CAPS Take 1 capsule by mouth daily.    [provider]  Omega-3 Fatty Acids (OMEGA 3 PO) Take 1 capsule by mouth daily after breakfast.    [provider]  omeprazole  (PRILOSEC) 20 MG capsule TAKE 1 CAPSULE BY MOUTH DAILY 08/22/23   Rollene Almarie LABOR, MD  POTASSIUM PO Take 1 tablet by mouth daily after breakfast.    [provider]  pravastatin  (PRAVACHOL ) 20 MG tablet TAKE 1 TABLET BY MOUTH DAILY 03/02/23   Rollene Almarie LABOR, MD  SYSTANE ULTRA 0.4-0.3 % SOLN Place 1 drop into both eyes in the morning and at bedtime.    [provider]  TYLENOL  8 HOUR ARTHRITIS PAIN 650 MG CR tablet Take 650 mg by mouth 2 (two) times daily.    [provider]  TYLENOL  PM EXTRA STRENGTH 500-25 MG TABS tablet Take 1 tablet by mouth at bedtime.    [provider]  VITAMIN E PO Take 1 capsule by mouth daily after breakfast.    [provider]  XYLIMELTS 550 MG DISK 1 tablet See admin instructions. Dissolve 1 tablet in the mouth at bedtime    [provider]    Allergies: Sulfonamide derivatives    Review of Systems  Constitutional:  Negative for chills and fever.  Respiratory:  Negative for shortness of breath.   Cardiovascular:  Negative for chest pain.  Gastrointestinal:  Negative for abdominal distention, constipation, diarrhea, nausea and vomiting.       Denies any fecal incontinence  Genitourinary:  Negative for dysuria and hematuria.       Denies any  urinary retention.  Reports that she has some urinary incontinence however this is unchanged from her baseline.  Musculoskeletal:  Positive for back pain.       Denies any saddle anesthesia  Neurological:  Negative for weakness and numbness.    Updated Vital Signs BP (!) 148/62 (BP Location: Right Arm)   Pulse 68   Temp (!) 97.4 F (36.3 C) (Oral)   Resp 16    Ht 5' 2 (1.575 m)   Wt 69.9 kg   SpO2 95%   BMI 28.17 kg/m   Physical Exam Vitals reviewed.  Constitutional:      Appearance: She is not toxic-appearing.     Comments: Appears to be slightly uncomfortable, but is pleasant  HENT:     Mouth/Throat:     Mouth: Mucous membranes are moist.  Eyes:     General: No scleral icterus. Cardiovascular:     Rate and Rhythm: Normal rate.     Pulses: Normal pulses.  Pulmonary:     Effort: Pulmonary effort is normal. No respiratory distress.  Chest:     Chest wall: No tenderness.  Abdominal:     Palpations: Abdomen is soft.  Tenderness: There is no abdominal tenderness. There is no guarding or rebound.  Musculoskeletal:        General: Tenderness present.     Cervical back: Normal range of motion. No tenderness.     Comments: She has diffuse tenderness of the lower back.  No specific point tenderness.  No overlying skin changes noted or signs of trauma.  Conflicting physical exam findings as she would occasionally have pain with palpation to the upper back but then would say it would not hurt.  She does place her hand to the lower back so palpation here might be causing pain to the area.  She is moving all of her extremities and reports sensations intact throughout.  Palpable DP, PT, radial pulses symmetric.  Compartments are soft.  Skin:    General: Skin is warm and dry.  Neurological:     General: No focal deficit present.     Mental Status: She is alert. Mental status is at baseline.     (all labs ordered are listed, but only abnormal results are displayed) Labs Reviewed  CBC WITH DIFFERENTIAL/PLATELET - Abnormal; Notable for the following components:      Result Value   Monocytes Absolute 1.2 (*)    Abs Immature Granulocytes 0.11 (*)    All other components within normal limits  COMPREHENSIVE METABOLIC PANEL WITH GFR - Abnormal; Notable for the following components:   Total Protein 6.3 (*)    GFR, Estimated 59 (*)    All  other components within normal limits  URINALYSIS, ROUTINE W REFLEX MICROSCOPIC - Abnormal; Notable for the following components:   Color, Urine STRAW (*)    All other components within normal limits    EKG: None  Radiology: CT PELVIS WO CONTRAST Result Date: 12/16/2023 CLINICAL DATA:  Low back pain EXAM: CT PELVIS WITHOUT CONTRAST TECHNIQUE: Multidetector CT imaging of the pelvis was performed following the standard protocol without intravenous contrast. RADIATION DOSE REDUCTION: This exam was performed according to the departmental dose-optimization program which includes automated exposure control, adjustment of the mA and/or kV according to patient size and/or use of iterative reconstruction technique. COMPARISON:  Pelvic radiograph 12/30/2014 FINDINGS: Urinary Tract:  No abnormality visualized. Bowel:  Unremarkable visualized pelvic bowel loops. Vascular/Lymphatic: Aortic atherosclerosis. No aneurysm. No suspicious lymph node Reproductive:  Hysterectomy.  No adnexal mass Other:  Negative for pelvic effusion or free air. Musculoskeletal: No acute osseous abnormality. Mild SI joint degenerative change and mild bilateral no moral acetabular degenerative change. Pubic symphysis and rami appear intact IMPRESSION: 1. No acute osseous abnormality. Mild degenerative changes. 2. Aortic atherosclerosis. Aortic Atherosclerosis (ICD10-I70.0). Electronically Signed   By: Luke Bun M.D.   On: 12/16/2023 14:42   CT Lumbar Spine Wo Contrast Result Date: 12/16/2023 CLINICAL DATA:  Worsening back pain EXAM: CT LUMBAR SPINE WITHOUT CONTRAST TECHNIQUE: Multidetector CT imaging of the lumbar spine was performed without intravenous contrast administration. Multiplanar CT image reconstructions were also generated. RADIATION DOSE REDUCTION: This exam was performed according to the departmental dose-optimization program which includes automated exposure control, adjustment of the mA and/or kV according to patient size  and/or use of iterative reconstruction technique. COMPARISON:  Radiograph 04/22/2023, CT 05/23/2023 FINDINGS: Segmentation: 5 lumbar type vertebrae. Alignment: Mild levoscoliosis.  Mild kyphosis of the lumbar spine. Vertebrae: Post augmentation changes at L3 and L4 with chronic compression deformities. Interval acute appearing fracture involving the superior aspect of L1 vertebral body with sli linear lucency present. Slight depression of the superior endplate with less  than 20% loss of vertebral body height centrally. Minimal 2 mm retropulsion of the upper vertebral body. No significant bony canal stenosis. Paraspinal and other soft tissues: Mild paravertebral edema at L1. Aortic atherosclerosis. Left renal cyst, no specific imaging follow-up is recommended. Disc levels: Multilevel vacuum disc. Multilevel hypertrophic facet degenerative changes. No high-grade canal stenosis or high-grade foraminal narrowing. IMPRESSION: 1. Interval acute appearing fracture involving the superior aspect of L1 vertebral body with slight depression of the superior endplate and less than 20% loss of vertebral body height centrally. Minimal 2 mm retropulsion of the upper vertebral body. No significant bony canal stenosis. 2. Post augmentation changes at L3 and L4 with chronic compression deformities. 3. Aortic atherosclerosis. Aortic Atherosclerosis (ICD10-I70.0). Electronically Signed   By: Luke Bun M.D.   On: 12/16/2023 14:38   CT Thoracic Spine Wo Contrast Result Date: 12/16/2023 CLINICAL DATA:  Worsening back pain EXAM: CT THORACIC SPINE WITHOUT CONTRAST TECHNIQUE: Multidetector CT images of the thoracic were obtained using the standard protocol without intravenous contrast. RADIATION DOSE REDUCTION: This exam was performed according to the departmental dose-optimization program which includes automated exposure control, adjustment of the mA and/or kV according to patient size and/or use of iterative reconstruction technique.  COMPARISON:  Chest CT 04/28/2022 FINDINGS: Alignment: Normal. Vertebrae: No acute fracture or focal pathologic process. Paraspinal and other soft tissues: No acute paravertebral or paraspinal soft tissue abnormality. Aortic atherosclerosis. Disc levels: Multilevel degenerative osteophytes. Mild calcification of posterior longitudinal ligament at T7-T8. IMPRESSION: 1. No CT evidence for acute osseous abnormality. 2. Multilevel degenerative changes. 3. Aortic atherosclerosis. Aortic Atherosclerosis (ICD10-I70.0). Electronically Signed   By: Luke Bun M.D.   On: 12/16/2023 14:23    Procedures   Medications Ordered in the ED  oxyCODONE  (Oxy IR/ROXICODONE ) immediate release tablet 5 mg (5 mg Oral Given 12/16/23 1247)    Clinical Course as of 12/16/23 1554  Fri Dec 16, 2023  1426 CT Thoracic Spine Wo Contrast [RR]    Clinical Course User Index [RR] Bernis Ernst, PA-C    Medical Decision Making Amount and/or Complexity of Data Reviewed Labs: ordered. Radiology: ordered. Decision-making details documented in ED Course.  Risk Prescription drug management. Decision regarding hospitalization.   89 y.o. female presents to the ER for evaluation of back pain. Differential diagnosis includes but is not limited to Fracture (acute/chronic), muscle strain, cauda equina, spinal stenosis, DDD, metastatic cancer, vertebral osteomyelitis, kidney stone, pyelonephritis, AAA, pancreatitis, bowel obstruction, meningitis. Vital signs elevated BP otherwise unremarkable. Physical exam as noted above.   The patient has not had any acute falls.  She is a poor historian and has some cognitive decline per family.  She reports that her upper back was hurting however was having some questional tenderness palpation indication would feel pain but then would not feel pain to the area but seems to be holding her lower back.  She reports that she is diffusely tender on the lower back.  I do not see any overlying skin  changes noted.  She tries to roll in the bed but has significant pain with doing so.  She is neurovascular intact distally.  She denies any chest pain, shortness breath, belly pain..  She denies any pain rating to her legs and denies any numbness or tingling.  Discussed this my attending, will order CT imaging.  Pain medication ordered.  I independently reviewed and interpreted the patient's labs.  Urinalysis shows tall colored urine otherwise unremarkable.  CBC without leukocytosis or anemia.  CMP shows total  protein of 6.3 otherwise unremarkable.  CT imaging shows degenerative changes and  1. Interval acute appearing fracture involving the superior aspect  of L1 vertebral body with slight depression of the superior endplate  and less than 20% loss of vertebral body height centrally. Minimal 2  mm retropulsion of the upper vertebral body. No significant bony  canal stenosis.  2. Post augmentation changes at L3 and L4 with chronic compression  deformities.  3. Aortic atherosclerosis.   I think the compression fracture involving L1 is likely the cause of her pain.  She has no other back pain concerning symptoms such as fever, history of IV drug use, saddle anesthesia.  She does have some urinary incontinence secondary to age but has not noticed any worsening of this.  Does not have any fecal incontinence.  I have a lower excision for any cauda equina or any infection of the spine.    I consulted neurosurgery and spoke with Dr. Colon who agreed with pain management and TLSO brace. No other interventions or imaging recommendations.   Patient appears to be feeling better with the pain medication as she is ambulating and moving a little better however she reports that her pain and symptoms are unchanged.  Given her age as well as living at assisted living facility and having some ability issues, and concern for managing her pain and fall risk at home.  Will admit to medicine for pain management and PT  OT evaluation.  I have already ordered her TLSO brace. [facility] hospitalist to admit.   I discussed this case with my attending physician who cosigned this note including patient's presenting symptoms, physical exam, and planned diagnostics and interventions. Attending physician stated agreement with plan or made changes to plan which were implemented.   Attending physician assessed patient at bedside.   Portions of this report may have been transcribed using voice recognition software. Every effort was made to ensure accuracy; however, inadvertent computerized transcription errors may be present.    Final diagnoses:  Compression fracture of L1 vertebra, initial encounter [facility])    ED Discharge Orders     None          Bernis Ernst, DEVONNA 12/16/23 2052    Randol Simmonds, MD 12/17/23 [phone]

## 2023-12-16 NOTE — Progress Notes (Signed)
 Deanna Olson

## 2023-12-16 NOTE — H&P (Signed)
 History and Physical  Kasi Lasky FMW:[id number] DOB: 1925/12/12 DOA: 12/16/2023  PCP: Patient, No Pcp Per   Chief Complaint: Back pain  HPI: Deanna Olson is a 89 y.o. female with medical history significant for hypertension, hyperlipidemia, Alzheimer's dementia being admitted to the hospital with 3 days of back pain found to have L1 compression fracture.  She lives at [facility], has been having back and shoulder pain for the last 3 days, on examination she has pain in the lumbar back.  She denies any injury, denies any fevers or chills.  She has not had any new urinary incontinence, saddle anesthesia or other alarming symptoms.  She was evaluated this morning in the emergency department for this back pain, workup as detailed below shows evidence of L1 compression fracture.  TLSO brace has been placed, and hospitalist admission was requested for pain control and physical therapy.  Review of Systems: Please see HPI for pertinent positives and negatives. A complete 10 system review of systems are otherwise negative.  Past Medical History:  Diagnosis Date   Allergic rhinitis, cause unspecified    Anxiety state, unspecified    Aortic valve disorders    Mild AS 2013 echo    Eczema    Esophageal reflux    Herpes zoster without mention of complication    Hyperlipidemia    Hypertension    Hypothyroid    Osteoarthrosis, unspecified whether generalized or localized, unspecified site    Unspecified venous (peripheral) insufficiency    Past Surgical History:  Procedure Laterality Date   CATARACT EXTRACTION, BILATERAL  2013   KYPHOPLASTY  02/2018   VESICOVAGINAL FISTULA CLOSURE W/ TAH     Social History:  reports that she has never smoked. She has never used smokeless tobacco. She reports that she does not drink alcohol and does not use drugs.  Allergies  Allergen Reactions   Sulfonamide Derivatives Swelling and Other (See Comments)    Angioedema     Family  History  Problem Relation Age of Onset   Stroke Mother 62   Diabetes Mother    Diabetes Brother    Heart attack Brother 64       s/p open heart surg   CAD Brother 16   Cancer Sister        4 types     Prior to Admission medications   Medication Sig Start Date End Date Taking? Authorizing Provider  acetaminophen  (TYLENOL ) 325 MG tablet Take 650 mg by mouth in the morning and at bedtime.   Yes [provider]  ALPRAZolam  (XANAX ) 0.5 MG tablet TAKE 1 TABLET BY MOUTH 2 TIMES A DAY Patient taking differently: Take 0.25-0.5 mg by mouth See admin instructions. 1/2 tablet in the morning, and 1 tablet at bedtime 08/22/23  Yes Rollene Almarie LABOR, MD  albuterol  (VENTOLIN  HFA) 108 (90 Base) MCG/ACT inhaler Inhale 1-2 puffs into the lungs every 6 (six) hours as needed for wheezing or shortness of breath.    [provider]  Apoaequorin (PREVAGEN PO) Take 1 capsule by mouth daily after breakfast.    [provider]  Ascorbic Acid (VITAMIN C PO) Take 1 tablet by mouth daily after breakfast.    [provider]  b complex vitamins capsule Take 1 capsule by mouth daily after breakfast.    [provider]  betamethasone  dipropionate 0.05 % lotion APPLY TOPICALLY DAILY AS NEEDED USES ON SCALP AND EARS Patient taking differently: Apply 1 application  topically daily as needed (for  irritation- behind the ears and scalp). 12/22/22   Rollene Almarie LABOR, MD  Boswellia-Glucosamine-Vit D (OSTEO BI-FLEX ONE PER DAY) TABS Take 1 tablet by mouth daily after breakfast.    [provider]  cephALEXin  (KEFLEX ) 500 MG capsule TAKE 1 CAPSULE BY MOUTH THREE TIMES A DAY FOR 7 DAYS 08/26/23   Rollene Almarie LABOR, MD  Cholecalciferol (VITAMIN D -3 PO) Take 1 capsule by mouth daily after breakfast.    [provider]  fluconazole  (DIFLUCAN ) 150 MG tablet Take 1 tablet (150 mg total) by mouth every 3 (three) days. 06/27/23   Rollene Almarie LABOR, MD  gabapentin   (NEURONTIN ) 100 MG capsule TAKE 1 CAPSULE BY MOUTH EVERY MORNING 06/27/23   Rollene Almarie LABOR, MD  gabapentin  (NEURONTIN ) 300 MG capsule TAKE 1 CAPSULE BY MOUTH EVERY NIGHT AT BEDTIME 06/06/23   Rollene Almarie LABOR, MD  guaiFENesin -dextromethorphan (ROBITUSSIN DM) 100-10 MG/5ML syrup Take 5 mLs by mouth every 4 (four) hours as needed for cough. 10/30/21   Norleen Lynwood ORN, MD  ketoconazole  (NIZORAL ) 2 % cream APPLY ONE APPLICATION TOPICALLY DAILY 08/26/23   Rollene Almarie LABOR, MD  ketoconazole  (NIZORAL ) 2 % shampoo APPLY TO AFFECTED AREA EVERY FRIDAY 08/30/23   Rollene Almarie LABOR, MD  levothyroxine  (SYNTHROID ) 50 MCG tablet TAKE 1 TABLET BY MOUTH DAILY BEFORE BREAKFAST 04/11/23   Rollene Almarie LABOR, MD  losartan  (COZAAR ) 50 MG tablet TAKE 1 TABLET BY MOUTH DAILY 08/08/23   Rollene Almarie LABOR, MD  miconazole  (MICATIN) 2 % cream Apply 1 Application topically 2 (two) times daily. 06/27/23   Rollene Almarie LABOR, MD  Miconazole  2 % POWD Apply 1 each topically in the morning and at bedtime. 06/27/23   Rollene Almarie LABOR, MD  mirabegron  ER (MYRBETRIQ ) 50 MG TB24 tablet TAKE 1 TABLET BY MOUTH DAILY **NEED APPOINTMENT FOR REFILLS** 08/02/23   Rollene Almarie LABOR, MD  Multiple Minerals-Vitamins (CAL MAG ZINC +D3 PO) Take 1 tablet by mouth daily after breakfast.    [provider]  Multiple Vitamins-Minerals (PRESERVISION AREDS 2) CAPS Take 1 capsule by mouth daily.    [provider]  Omega-3 Fatty Acids (OMEGA 3 PO) Take 1 capsule by mouth daily after breakfast.    [provider]  omeprazole  (PRILOSEC) 20 MG capsule TAKE 1 CAPSULE BY MOUTH DAILY 08/22/23   Rollene Almarie LABOR, MD  POTASSIUM PO Take 1 tablet by mouth daily after breakfast.    [provider]  pravastatin  (PRAVACHOL ) 20 MG tablet TAKE 1 TABLET BY MOUTH DAILY 03/02/23   Rollene Almarie LABOR, MD  SYSTANE ULTRA 0.4-0.3 % SOLN Place 1 drop into both eyes in the morning and at bedtime.    [provider]  TYLENOL  PM EXTRA STRENGTH 500-25 MG TABS tablet Take 1 tablet by mouth at bedtime.    [provider]  VITAMIN E PO Take 1 capsule by mouth daily after breakfast.    [provider]  XYLIMELTS 550 MG DISK 1 tablet See admin instructions. Dissolve 1 tablet in the mouth at bedtime    [provider]    Physical Exam: BP (!) 148/62 (BP Location: Right Arm)   Pulse 68   Temp (!) 97.4 F (36.3 C) (Oral)   Resp 16   Ht 5' 2 (1.575 m)   Wt 69.9 kg   SpO2 95%   BMI 28.17 kg/m  General:  Alert, oriented, calm, in no acute distress, her 2 daughters are at the bedside.  She is pleasant and cooperative, although  not the best historian given her dementia. Cardiovascular: RRR, no murmurs or rubs, no peripheral edema  Respiratory: clear to auscultation bilaterally, no wheezes, no crackles  Abdomen: soft, nontender, nondistended, normal bowel tones heard  Skin: dry, no rashes  Musculoskeletal: no joint effusions, normal range of motion, she has point tenderness over the lumbar spine Psychiatric: appropriate affect, normal speech  Neurologic: extraocular muscles intact, clear speech, moving all extremities with intact sensorium         Labs on Admission:  Basic Metabolic Panel: Recent Labs  Lab 12/16/23 1232  NA 138  K 4.6  CL 105  CO2 23  GLUCOSE 94  BUN 19  CREATININE 0.90  CALCIUM  9.1   Liver Function Tests: Recent Labs  Lab 12/16/23 1232  AST 28  ALT 10  ALKPHOS 41  BILITOT 1.0  PROT 6.3*  ALBUMIN 3.6   No results for input(s): LIPASE, AMYLASE in the last 168 hours. No results for input(s): AMMONIA in the last 168 hours. CBC: Recent Labs  Lab 12/16/23 1232  WBC 10.5  NEUTROABS 6.7  HGB 12.4  HCT 39.4  MCV 95.6  PLT 162   Cardiac Enzymes: No results for input(s): CKTOTAL, CKMB, CKMBINDEX, TROPONINI in the last 168 hours. BNP (last 3 results) No results for input(s): BNP in the last 8760 hours.  ProBNP  (last 3 results) No results for input(s): PROBNP in the last 8760 hours.  CBG: No results for input(s): GLUCAP in the last 168 hours.  Radiological Exams on Admission: CT PELVIS WO CONTRAST Result Date: 12/16/2023 CLINICAL DATA:  Low back pain EXAM: CT PELVIS WITHOUT CONTRAST TECHNIQUE: Multidetector CT imaging of the pelvis was performed following the standard protocol without intravenous contrast. RADIATION DOSE REDUCTION: This exam was performed according to the departmental dose-optimization program which includes automated exposure control, adjustment of the mA and/or kV according to patient size and/or use of iterative reconstruction technique. COMPARISON:  Pelvic radiograph 12/30/2014 FINDINGS: Urinary Tract:  No abnormality visualized. Bowel:  Unremarkable visualized pelvic bowel loops. Vascular/Lymphatic: Aortic atherosclerosis. No aneurysm. No suspicious lymph node Reproductive:  Hysterectomy.  No adnexal mass Other:  Negative for pelvic effusion or free air. Musculoskeletal: No acute osseous abnormality. Mild SI joint degenerative change and mild bilateral no moral acetabular degenerative change. Pubic symphysis and rami appear intact IMPRESSION: 1. No acute osseous abnormality. Mild degenerative changes. 2. Aortic atherosclerosis. Aortic Atherosclerosis (ICD10-I70.0). Electronically Signed   By: Luke Bun M.D.   On: 12/16/2023 14:42   CT Lumbar Spine Wo Contrast Result Date: 12/16/2023 CLINICAL DATA:  Worsening back pain EXAM: CT LUMBAR SPINE WITHOUT CONTRAST TECHNIQUE: Multidetector CT imaging of the lumbar spine was performed without intravenous contrast administration. Multiplanar CT image reconstructions were also generated. RADIATION DOSE REDUCTION: This exam was performed according to the departmental dose-optimization program which includes automated exposure control, adjustment of the mA and/or kV according to patient size and/or use of iterative reconstruction technique.  COMPARISON:  Radiograph 04/22/2023, CT 05/23/2023 FINDINGS: Segmentation: 5 lumbar type vertebrae. Alignment: Mild levoscoliosis.  Mild kyphosis of the lumbar spine. Vertebrae: Post augmentation changes at L3 and L4 with chronic compression deformities. Interval acute appearing fracture involving the superior aspect of L1 vertebral body with sli linear lucency present. Slight depression of the superior endplate with less than 20% loss of vertebral body height centrally. Minimal 2 mm retropulsion of the upper vertebral body. No significant bony canal stenosis. Paraspinal and other soft tissues: Mild paravertebral edema at L1. Aortic atherosclerosis. Left renal cyst,  no specific imaging follow-up is recommended. Disc levels: Multilevel vacuum disc. Multilevel hypertrophic facet degenerative changes. No high-grade canal stenosis or high-grade foraminal narrowing. IMPRESSION: 1. Interval acute appearing fracture involving the superior aspect of L1 vertebral body with slight depression of the superior endplate and less than 20% loss of vertebral body height centrally. Minimal 2 mm retropulsion of the upper vertebral body. No significant bony canal stenosis. 2. Post augmentation changes at L3 and L4 with chronic compression deformities. 3. Aortic atherosclerosis. Aortic Atherosclerosis (ICD10-I70.0). Electronically Signed   By: Luke Bun M.D.   On: 12/16/2023 14:38   CT Thoracic Spine Wo Contrast Result Date: 12/16/2023 CLINICAL DATA:  Worsening back pain EXAM: CT THORACIC SPINE WITHOUT CONTRAST TECHNIQUE: Multidetector CT images of the thoracic were obtained using the standard protocol without intravenous contrast. RADIATION DOSE REDUCTION: This exam was performed according to the departmental dose-optimization program which includes automated exposure control, adjustment of the mA and/or kV according to patient size and/or use of iterative reconstruction technique. COMPARISON:  Chest CT 04/28/2022 FINDINGS:  Alignment: Normal. Vertebrae: No acute fracture or focal pathologic process. Paraspinal and other soft tissues: No acute paravertebral or paraspinal soft tissue abnormality. Aortic atherosclerosis. Disc levels: Multilevel degenerative osteophytes. Mild calcification of posterior longitudinal ligament at T7-T8. IMPRESSION: 1. No CT evidence for acute osseous abnormality. 2. Multilevel degenerative changes. 3. Aortic atherosclerosis. Aortic Atherosclerosis (ICD10-I70.0). Electronically Signed   By: Luke Bun M.D.   On: 12/16/2023 14:23   Assessment/Plan Deanna Olson is a 89 y.o. female with medical history significant for hypertension, hyperlipidemia, Alzheimer's dementia being admitted to the hospital with 3 days of back pain found to have L1 compression fracture.  L1 compression fracture-without any history of trauma.  Unable to ambulate, being admitted for pain control. -Observation admission -Pain control -ER provider discussed with [facility] who recommends TLSO brace -PT  Chronic home medications which will be resumed once reconciled by pharmacy staff:  GERD-omeprazole   Hypertension-losartan   Hypothyroidism-Synthroid   Neuropathy-gabapentin   Hyperlipidemia-Pravachol   DVT prophylaxis: Subcutaneous heparin     Code Status: Full Code  Consults called: [facility] neurosurgery  Admission status: Observation  Time spent: 59 minutes  Arianne Klinge CHRISTELLA Gail MD [facility] Pager [phone]  If 7PM-7AM, please contact night-coverage [email or web address] Password [facility]  12/16/2023, 4:29 PM

## 2023-12-16 NOTE — ED Notes (Signed)
 Pt walked around the room with a walker with no complaints other than her back pain.

## 2023-12-16 NOTE — ED Notes (Signed)
 RN called 3E informing them patient is about to be transported up s/w [facility]

## 2023-12-17 DIAGNOSIS — Z823 Family history of stroke: Secondary | ICD-10-CM | POA: Diagnosis not present

## 2023-12-17 DIAGNOSIS — F0280 Dementia in other diseases classified elsewhere without behavioral disturbance: Secondary | ICD-10-CM | POA: Diagnosis not present

## 2023-12-17 DIAGNOSIS — K219 Gastro-esophageal reflux disease without esophagitis: Secondary | ICD-10-CM | POA: Diagnosis not present

## 2023-12-17 DIAGNOSIS — G309 Alzheimer's disease, unspecified: Secondary | ICD-10-CM | POA: Diagnosis not present

## 2023-12-17 DIAGNOSIS — Z9842 Cataract extraction status, left eye: Secondary | ICD-10-CM | POA: Diagnosis not present

## 2023-12-17 DIAGNOSIS — M810 Age-related osteoporosis without current pathological fracture: Secondary | ICD-10-CM | POA: Diagnosis not present

## 2023-12-17 DIAGNOSIS — M6281 Muscle weakness (generalized): Secondary | ICD-10-CM | POA: Diagnosis not present

## 2023-12-17 DIAGNOSIS — E039 Hypothyroidism, unspecified: Secondary | ICD-10-CM | POA: Diagnosis not present

## 2023-12-17 DIAGNOSIS — Z8249 Family history of ischemic heart disease and other diseases of the circulatory system: Secondary | ICD-10-CM | POA: Diagnosis not present

## 2023-12-17 DIAGNOSIS — Z888 Allergy status to other drugs, medicaments and biological substances status: Secondary | ICD-10-CM | POA: Diagnosis not present

## 2023-12-17 DIAGNOSIS — R278 Other lack of coordination: Secondary | ICD-10-CM | POA: Diagnosis not present

## 2023-12-17 DIAGNOSIS — F419 Anxiety disorder, unspecified: Secondary | ICD-10-CM | POA: Diagnosis not present

## 2023-12-17 DIAGNOSIS — R32 Unspecified urinary incontinence: Secondary | ICD-10-CM | POA: Diagnosis not present

## 2023-12-17 DIAGNOSIS — Z79890 Hormone replacement therapy (postmenopausal): Secondary | ICD-10-CM | POA: Diagnosis not present

## 2023-12-17 DIAGNOSIS — Z66 Do not resuscitate: Secondary | ICD-10-CM | POA: Diagnosis not present

## 2023-12-17 DIAGNOSIS — M1990 Unspecified osteoarthritis, unspecified site: Secondary | ICD-10-CM | POA: Diagnosis not present

## 2023-12-17 DIAGNOSIS — N3281 Overactive bladder: Secondary | ICD-10-CM | POA: Diagnosis not present

## 2023-12-17 DIAGNOSIS — Z833 Family history of diabetes mellitus: Secondary | ICD-10-CM | POA: Diagnosis not present

## 2023-12-17 DIAGNOSIS — F0284 Dementia in other diseases classified elsewhere, unspecified severity, with anxiety: Secondary | ICD-10-CM | POA: Diagnosis not present

## 2023-12-17 DIAGNOSIS — I10 Essential (primary) hypertension: Secondary | ICD-10-CM | POA: Diagnosis not present

## 2023-12-17 DIAGNOSIS — M4856XA Collapsed vertebra, not elsewhere classified, lumbar region, initial encounter for fracture: Secondary | ICD-10-CM | POA: Diagnosis not present

## 2023-12-17 DIAGNOSIS — Z9841 Cataract extraction status, right eye: Secondary | ICD-10-CM | POA: Diagnosis not present

## 2023-12-17 DIAGNOSIS — F411 Generalized anxiety disorder: Secondary | ICD-10-CM | POA: Diagnosis not present

## 2023-12-17 DIAGNOSIS — S32010D Wedge compression fracture of first lumbar vertebra, subsequent encounter for fracture with routine healing: Secondary | ICD-10-CM | POA: Diagnosis not present

## 2023-12-17 DIAGNOSIS — R2689 Other abnormalities of gait and mobility: Secondary | ICD-10-CM | POA: Diagnosis not present

## 2023-12-17 DIAGNOSIS — N1831 Chronic kidney disease, stage 3a: Secondary | ICD-10-CM | POA: Diagnosis not present

## 2023-12-17 DIAGNOSIS — Z79899 Other long term (current) drug therapy: Secondary | ICD-10-CM | POA: Diagnosis not present

## 2023-12-17 DIAGNOSIS — G629 Polyneuropathy, unspecified: Secondary | ICD-10-CM | POA: Diagnosis not present

## 2023-12-17 DIAGNOSIS — S32010A Wedge compression fracture of first lumbar vertebra, initial encounter for closed fracture: Secondary | ICD-10-CM | POA: Diagnosis not present

## 2023-12-17 DIAGNOSIS — M549 Dorsalgia, unspecified: Secondary | ICD-10-CM | POA: Diagnosis not present

## 2023-12-17 DIAGNOSIS — E785 Hyperlipidemia, unspecified: Secondary | ICD-10-CM | POA: Diagnosis not present

## 2023-12-17 DIAGNOSIS — G8929 Other chronic pain: Secondary | ICD-10-CM | POA: Diagnosis not present

## 2023-12-17 DIAGNOSIS — Z9181 History of falling: Secondary | ICD-10-CM | POA: Diagnosis not present

## 2023-12-17 LAB — BASIC METABOLIC PANEL WITH GFR
Anion gap: 12 (ref 5–15)
BUN: 17 mg/dL (ref 8–23)
CO2: 25 mmol/L (ref 22–32)
Calcium: 9.3 mg/dL (ref 8.9–10.3)
Chloride: 103 mmol/L (ref 98–111)
Creatinine, Ser: 0.81 mg/dL (ref 0.44–1.00)
GFR, Estimated: >60 mL/min (ref >60)
Glucose, Bld: 98 mg/dL (ref 70–99)
Potassium: 3.9 mmol/L (ref 3.5–5.1)
Sodium: 140 mmol/L (ref 135–145)

## 2023-12-17 LAB — CBC
HCT: 39.3 % (ref 36.0–46.0)
Hemoglobin: 12.6 g/dL (ref 12.0–15.0)
MCH: 30 pg (ref 26.0–34.0)
MCHC: 32.1 g/dL (ref 30.0–36.0)
MCV: 93.6 fL (ref 80.0–100.0)
Platelets: 147 K/uL — ABNORMAL LOW (ref 150–400)
RBC: 4.2 MIL/uL (ref 3.87–5.11)
RDW: 15.4 % (ref 11.5–15.5)
WBC: 8.4 K/uL (ref 4.0–10.5)
nRBC: 0 % (ref 0.0–0.2)

## 2023-12-17 MED ORDER — GUAIFENESIN-DM 100-10 MG/5ML PO SYRP: 5.0000 mL | ORAL_SOLUTION | ORAL | Status: DC | PRN

## 2023-12-17 MED ORDER — PANTOPRAZOLE SODIUM 40 MG PO TBEC
40.0000 mg | DELAYED_RELEASE_TABLET | Freq: Every day | ORAL | Status: DC
  Administered 2023-12-17 – 2023-12-20 (×4): 40 mg via ORAL
  Filled 2023-12-17 (×4): qty 1

## 2023-12-17 MED ORDER — GABAPENTIN 100 MG PO CAPS
100.0000 mg | ORAL_CAPSULE | Freq: Every day | ORAL | Status: DC
  Administered 2023-12-17 – 2023-12-20 (×4): 100 mg via ORAL
  Filled 2023-12-17 (×3): qty 1

## 2023-12-17 MED ORDER — HALOPERIDOL LACTATE 5 MG/ML IJ SOLN
5.0000 mg | Freq: Four times a day (QID) | INTRAMUSCULAR | Status: DC | PRN
  Administered 2023-12-17 – 2023-12-19 (×2): 5 mg via INTRAVENOUS
  Filled 2023-12-17 (×2): qty 1

## 2023-12-17 MED ORDER — MIRABEGRON ER 25 MG PO TB24
50.0000 mg | ORAL_TABLET | Freq: Every day | ORAL | Status: DC
  Administered 2023-12-17 – 2023-12-20 (×4): 50 mg via ORAL
  Filled 2023-12-17 (×4): qty 2

## 2023-12-17 MED ORDER — PRAVASTATIN SODIUM 20 MG PO TABS
20.0000 mg | ORAL_TABLET | Freq: Every day | ORAL | Status: DC
  Administered 2023-12-17 – 2023-12-20 (×4): 20 mg via ORAL
  Filled 2023-12-17 (×4): qty 1

## 2023-12-17 MED ORDER — LOSARTAN POTASSIUM 50 MG PO TABS
50.0000 mg | ORAL_TABLET | Freq: Every day | ORAL | Status: DC
  Administered 2023-12-17 – 2023-12-20 (×4): 50 mg via ORAL
  Filled 2023-12-17 (×4): qty 1

## 2023-12-17 MED ORDER — MELATONIN 3 MG PO TABS
6.0000 mg | ORAL_TABLET | Freq: Every day | ORAL | Status: DC
  Administered 2023-12-17 – 2023-12-19 (×3): 6 mg via ORAL
  Filled 2023-12-17 (×3): qty 2

## 2023-12-17 MED ORDER — LEVOTHYROXINE SODIUM 50 MCG PO TABS
50.0000 ug | ORAL_TABLET | Freq: Every day | ORAL | Status: DC
  Administered 2023-12-18 – 2023-12-20 (×3): 50 ug via ORAL
  Filled 2023-12-17 (×3): qty 1

## 2023-12-17 MED ORDER — GABAPENTIN 100 MG PO CAPS
300.0000 mg | ORAL_CAPSULE | Freq: Every day | ORAL | Status: DC
  Administered 2023-12-17 – 2023-12-19 (×3): 300 mg via ORAL
  Filled 2023-12-17 (×3): qty 3

## 2023-12-17 NOTE — Evaluation (Signed)
 Occupational Therapy Evaluation Patient Details Name: Deanna Olson MRN: [id number] DOB: 11-27-1925 Today's Date: 12/17/2023   History of Present Illness   89 y.o. female being admitted to the hospital with 3 days of back pain found to have L1 compression fracture. neurosurgery consulted by ED physician,  recommended TLSO brace and ambulation with PT.    PMH: hypertension, hyperlipidemia, Alzheimer's dementia, kyphoplasty 2019     Clinical Impressions PTA, patient lives in [facility] and was using RW for mobility and able to complete BADL's, staff provide assist with meals and meds; local daughters assist with transportation. Currently, patient presents with deficits outlined below (see OT Problem List for details) most significantly pain, now need for TLSO brace, decreased cognition and safety, balance and activity tolerance deficits limiting BADL's and functional mobility.  Patient requires continued Acute care hospital level OT services to progress safety and functional performance and allow for discharge. Patient will benefit from continued inpatient follow up therapy, <3 hours/day.        If plan is discharge home, recommend the following:   Two people to help with walking and/or transfers;A lot of help with bathing/dressing/bathroom;Assistance with cooking/housework;Direct supervision/assist for medications management;Direct supervision/assist for financial management;Assist for transportation;Help with stairs or ramp for entrance;Supervision due to cognitive status     Functional Status Assessment   Patient has had a recent decline in their functional status and demonstrates the ability to make significant improvements in function in a reasonable and predictable amount of time.     Equipment Recommendations   None recommended by OT      Precautions/Restrictions   Precautions Precautions: Fall;Back Recall of Precautions/Restrictions: Impaired Required Braces or  Orthoses: Spinal Brace Spinal Brace: Thoracolumbosacral orthotic;Applied in sitting position;Other (comment) (was not in place upon OT arrival with patient in recliner) Spinal Brace Comments: Brace on at all times, no doning position specified Restrictions Weight Bearing Restrictions Per Provider Order: No     Mobility Bed Mobility Overal bed mobility: Needs Assistance Bed Mobility: Rolling, Sit to Sidelying Rolling: Mod assist, Used rails       Sit to sidelying: Mod assist General bed mobility comments: cues for log roll technique, assist to progress LEs into bed and reposition up the bed    Transfers Overall transfer level: Needs assistance Equipment used: Rolling walker (2 wheels) Transfers: Sit to/from Stand, Bed to chair/wheelchair/BSC Sit to Stand: Min assist, +2 safety/equipment, +2 physical assistance     Step pivot transfers: Min assist, +2 physical assistance, +2 safety/equipment     General transfer comment: mod cues for hand placement and pacing due to pain and impulsivity, cues for all safety and TLSO puropse and need      Balance Overall balance assessment: Needs assistance, History of Falls Sitting-balance support: Feet supported, No upper extremity supported Sitting balance-Leahy Scale: Fair     Standing balance support: Reliant on assistive device for balance, During functional activity Standing balance-Leahy Scale: Poor                             ADL either performed or assessed with clinical judgement   ADL Overall ADL's : Needs assistance/impaired Eating/Feeding: Set up;Sitting   Grooming: Wash/dry hands;Wash/dry face;Oral care;Contact guard assist;Sitting   Upper Body Bathing: Minimal assistance;Sitting   Lower Body Bathing: Maximal assistance;Sit to/from stand   Upper Body Dressing : Sitting;Maximal assistance (TLSO)   Lower Body Dressing: Total assistance;Bed level Lower Body Dressing Details (indicate cue type  and reason):  forward bending with pain limitations Toilet Transfer: Minimal assistance;+2 for physical assistance;+2 for safety/equipment;Rolling walker (2 wheels);BSC/3in1   Toileting- Clothing Manipulation and Hygiene: Maximal assistance;Sitting/lateral lean       Functional mobility during ADLs: Minimal assistance;+2 for physical assistance;+2 for safety/equipment;Rolling walker (2 wheels) General ADL Comments: mod cues for safety, spinal alignment     Vision Baseline Vision/History: 1 Wears glasses Ability to See in Adequate Light: 0 Adequate Patient Visual Report: No change from baseline Vision Assessment?: Wears glasses for reading Additional Comments: able to see clock on wall above bedside and items on tray table without issues            Pertinent Vitals/Pain Pain Assessment Pain Assessment: Faces Faces Pain Scale: Hurts even more Pain Location: back and knees Pain Descriptors / Indicators: Discomfort, Guarding (screaming with certain movements ie sit to supine) Pain Intervention(s): Limited activity within patient's tolerance, Monitored during session, Premedicated before session, Repositioned, Relaxation     Extremity/Trunk Assessment Upper Extremity Assessment Upper Extremity Assessment: Right hand dominant;Overall [facility] for tasks assessed   Lower Extremity Assessment Lower Extremity Assessment: Defer to PT evaluation   Cervical / Trunk Assessment Cervical / Trunk Assessment: Other exceptions Cervical / Trunk Exceptions: hx of kyphoplasty   Communication Communication Communication: Impaired Factors Affecting Communication: Hearing impaired   Cognition Arousal: Alert Behavior During Therapy: Anxious, Impulsive Cognition: History of cognitive impairments             OT - Cognition Comments: alert, O x self and hospital as well as gross idea her back pain brought her in, decreased STM and recall of  plof at ALF, needs reassurance for safety, decreased insight and  judgement                 Following commands: Impaired Following commands impaired: Follows one step commands inconsistently (needs repetition)     Cueing  General Comments   Cueing Techniques: Verbal cues;Tactile cues  no edema nor SOB noted           Home Living Family/patient expects to be discharged to:: Assisted living                             Home Equipment: Rolling Walker (2 wheels);Rollator (4 wheels);Cane - single point;Grab bars - tub/shower   Additional Comments: [facility]      Prior Functioning/Environment Prior Level of Function : Independent/Modified Independent             Mobility Comments: as per PT eval- formerly ambulates with RW; could ambulate in community but fatigued easily;  dtr reports pt walks very fast typically but does consistently use RW ADLs Comments: PT eval findings- Daughter's drive; Pt independent with ADLs; Family assist with IADLs    OT Problem List: Decreased activity tolerance;Impaired balance (sitting and/or standing);Decreased cognition;Decreased safety awareness;Decreased knowledge of use of DME or AE;Decreased knowledge of precautions;Pain   OT Treatment/Interventions: Self-care/ADL training;Therapeutic exercise;Energy conservation;DME and/or AE instruction;Therapeutic activities;Cognitive remediation/compensation;Patient/family education;Balance training      OT Goals(Current goals can be found in the care plan section)   Acute Rehab OT Goals Patient Stated Goal: to feel better OT Goal Formulation: With patient Time For Goal Achievement: 12/31/23 Potential to Achieve Goals: Fair ADL Goals Pt Will Perform Lower Body Bathing: with adaptive equipment;with min assist;sit to/from stand Pt Will Perform Lower Body Dressing: with adaptive equipment;with min assist;sit to/from stand Pt Will Transfer to Toilet: with  contact guard assist;ambulating;grab bars Pt Will Perform Toileting - Clothing  Manipulation and hygiene: with contact guard assist;sitting/lateral leans   OT Frequency:  Min 2X/week       AM-PAC OT 6 Clicks Daily Activity     Outcome Measure Help from another person eating meals?: A Little Help from another person taking care of personal grooming?: A Little Help from another person toileting, which includes using toliet, bedpan, or urinal?: A Lot Help from another person bathing (including washing, rinsing, drying)?: A Lot Help from another person to put on and taking off regular upper body clothing?: A Lot Help from another person to put on and taking off regular lower body clothing?: A Lot 6 Click Score: 14   End of Session Equipment Utilized During Treatment: Gait belt;Rolling walker (2 wheels);Back brace Nurse Communication: Mobility status  Activity Tolerance: Patient limited by fatigue;Patient limited by pain Patient left: in bed;with call bell/phone within reach;with bed alarm set  OT Visit Diagnosis: Unsteadiness on feet (R26.81);Cognitive communication deficit (R41.841);Pain Pain - part of body:  (back)                Time: 1310-1350 OT Time Calculation (min): 40 min Charges:  OT General Charges $OT Visit: 1 Visit OT Evaluation $OT Eval Low Complexity: 1 Low OT Treatments $Self Care/Home Management : 23-37 mins  Antuane Eastridge OT/L Acute Rehabilitation Department  [phone]  12/17/2023, 2:03 PM

## 2023-12-17 NOTE — TOC Initial Note (Signed)
 Transition of Care [facility]) - Initial/Assessment Note    Patient Details  Name: Deanna Olson MRN: [id number] Date of Birth: 03-17-1926  Transition of Care [facility]) CM/SW Contact:    Sonda Manuella Quill, RN Phone Number: 12/17/2023, 1:10 PM  Clinical Narrative:                 Beatris w/ pt's dtr Adonna Sor at bedside; she says pt lives at [facility]; family plans for her to return at d/c; she identified POC Macario Dunker [phone]); pt insurance/PCP verified; her dtr denied pt experiencing SDOH risks; pt has walker; she does not have HH services, or home oxygen; also spoke w/ [name], resident care coordinator at [facility]; she says pt is in AL; awaiting PT/OT evals.  Expected Discharge Plan: Skilled Nursing Facility Barriers to Discharge: Continued Medical Work up   Patient Goals and CMS Choice Patient states their goals for this hospitalization and ongoing recovery are:: Pt's dtr Adonna Sor says she will return to [facility] [email or web address] Compare Post Acute Care list provided to:: Patient Represenative (must comment) Floria Sor (dtr))   [place] ownership interest in [facility].provided to:: Adult Children    Expected Discharge Plan and Services   Discharge Planning Services: CM Consult   Living arrangements for the past 2 months: Assisted Living Facility                                      Prior Living Arrangements/Services Living arrangements for the past 2 months: Assisted Living Facility Lives with:: Facility Resident Patient language and need for interpreter reviewed:: Yes Do you feel safe going back to the place where you live?: Yes      Need for Family Participation in Patient Care: Yes (Comment) Care giver support system in place?: Yes (comment) Current home services: DME (walker) Criminal Activity/Legal Involvement Pertinent to Current Situation/Hospitalization: No - Comment as needed  Activities of Daily Living   ADL Screening  (condition at time of admission) Independently performs ADLs?: Yes (appropriate for developmental age) Is the patient deaf or have difficulty hearing?: Yes Does the patient have difficulty seeing, even when wearing glasses/contacts?: No Does the patient have difficulty concentrating, remembering, or making decisions?: Yes  Permission Sought/Granted Permission sought to share information with : Case Manager Permission granted to share information with : Yes, Verbal Permission Granted  Share Information with NAME: Case Manager     Permission granted to share info w Relationship: Macario Dunker (dtr) [phone]     Emotional Assessment Appearance:: Appears stated age Attitude/Demeanor/Rapport: Unable to Assess Affect (typically observed): Unable to Assess Orientation: :  (unable to assess) Alcohol / Substance Use: Not Applicable Psych Involvement: No (comment)  Admission diagnosis:  Lumbar compression fracture (HCC) [S32.000A] Patient Active Problem List   Diagnosis Date Noted   Lumbar compression fracture (HCC) 12/16/2023   COVID 06/13/2023   Elevated CK 04/28/2023   Alzheimer disease (HCC) 02/17/2023   OAB (overactive bladder) 02/17/2023   LBBB (left bundle branch block) 02/28/2022   Bilateral hearing loss 08/15/2020   Neuropathy 12/27/2019   Osteoporosis 02/20/2018   Cough 02/20/2018   Rash 01/27/2017   Low back pain 07/06/2016   Right knee pain 07/06/2016   Chronic kidney disease, stage 3a (HCC) 08/13/2014   Routine general medical examination at a health care facility 08/13/2014   Vitamin D  deficiency 12/02/2009   Aortic stenosis, mild 01/24/2008  DEGENERATIVE JOINT DISEASE 11/05/2007   Hypothyroidism 10/31/2007   Essential hypertension 10/31/2007   Allergic rhinitis 10/31/2007   Mixed hyperlipidemia 04/12/2007   Anxiety state 04/12/2007   GERD without esophagitis 04/12/2007   PCP:  Patient, No Pcp Per Pharmacy:   [id number] [place],  [place] - [address] [place] [address] Phone: [phone] Fax: [phone]     Social Drivers of Health (SDOH) Social History: SDOH Screenings   Food Insecurity: No Food Insecurity (12/17/2023)  Housing: Low Risk  (12/17/2023)  Transportation Needs: No Transportation Needs (12/17/2023)  Utilities: Not At Risk (12/17/2023)  Alcohol Screen: Low Risk  (02/13/2021)  Depression (PHQ2-9): Medium Risk (07/11/2023)  Financial Resource Strain: Low Risk  (12/24/2022)  Physical Activity: Inactive (12/24/2022)  Social Connections: Unknown (12/16/2023)  Stress: No Stress Concern Present (12/24/2022)  Tobacco Use: Low Risk  (12/16/2023)  Health Literacy: Adequate Health Literacy (12/24/2022)   SDOH Interventions: Food Insecurity Interventions: Intervention Not Indicated, Inpatient TOC Housing Interventions: Intervention Not Indicated, Inpatient TOC Transportation Interventions: Intervention Not Indicated, Inpatient TOC Utilities Interventions: Intervention Not Indicated, Inpatient TOC   Readmission Risk Interventions     No data to display

## 2023-12-17 NOTE — Progress Notes (Signed)
 PROGRESS NOTE    Deanna Olson  FMW:[id number] DOB: 1926-05-04 DOA: 12/16/2023 PCP: Patient, No Pcp Per   Brief Narrative:  HPI: Deanna Olson is a 89 y.o. female with medical history significant for hypertension, hyperlipidemia, Alzheimer's dementia being admitted to the hospital with 3 days of back pain found to have L1 compression fracture.  She lives at [facility], has been having back and shoulder pain for the last 3 days, on examination she has pain in the lumbar back.  She denies any injury, denies any fevers or chills.  She has not had any new urinary incontinence, saddle anesthesia or other alarming symptoms.  She was evaluated this morning in the emergency department for this back pain, workup as detailed below shows evidence of L1 compression fracture.  TLSO brace has been placed, and hospitalist admission was requested for pain control and physical therapy.   Assessment & Plan:   Principal Problem:   Lumbar compression fracture (HCC)  L1 compression fracture-without any history of trauma.  Unable to ambulate, case discussed with Dr. Elsner/neurosurgery by ED physician, he recommended TLSO brace and ambulation with PT.  PT OT consulted.  Continue current pain management/oxycodone  every 4 hours as needed.     GERD-omeprazole    Hypertension-blood pressure slightly elevated, resume losartan    Hypothyroidism-resume Synthroid    Neuropathy-resume gabapentin    Hyperlipidemia-Pravachol   DVT prophylaxis: heparin  injection 5,000 Units Start: 12/16/23 1630   Code Status: Full Code  Family Communication:  None present at bedside.  Plan of care discussed with patient in length and he/she verbalized understanding and agreed with it.  Status is: Observation The patient will require care spanning > 2 midnights and should be moved to inpatient because: Patient is still with pain, needs better pain control, needs to be seen by PT OT, may need to go to  SNF.   Estimated body mass index is 28.17 kg/m as calculated from the following:   Height as of this encounter: 5' 2 (1.575 m).   Weight as of this encounter: 69.9 kg.    Nutritional Assessment: Body mass index is 28.17 kg/m.SABRA Seen by dietician.  I agree with the assessment and plan as outlined below: Nutrition Status:        . Skin Assessment: I have examined the patient's skin and I agree with the wound assessment as performed by the wound care RN as outlined below:    Consultants:  Neurosurgery consulted over the phone by ED physician at admission  Procedures:  None  Antimicrobials:  Anti-infectives (From admission, onward)    None         Subjective: Patient seen and examined.  She complains of low back pain.  She is unable to tell me grading of the pain.  However she says it is slightly better than yesterday.  She is able to move/lift her lower extremities.  Objective: Vitals:   12/16/23 1800 12/16/23 2116 12/17/23 0144 12/17/23 0513  BP: (!) 180/70 (!) 151/70 (!) 148/69 (!) 155/80  Pulse: 64 64 81 62  Resp: 16 16 16 16   Temp: 97.7 F (36.5 C) 98 F (36.7 C) 98.2 F (36.8 C) 98 F (36.7 C)  TempSrc: Oral Oral Oral Oral  SpO2: 96% 95% 95% 92%  Weight:      Height:        Intake/Output Summary (Last 24 hours) at 12/17/2023 0823 Last data filed at 12/17/2023 0513 Gross per 24 hour  Intake 620 ml  Output --  Net 620  ml   Filed Weights   12/16/23 1151  Weight: 69.9 kg    Examination:  General exam: Appears calm and comfortable  Respiratory system: Clear to auscultation. Respiratory effort normal. Cardiovascular system: S1 & S2 heard, RRR. No JVD, murmurs, rubs, gallops or clicks. No pedal edema. Gastrointestinal system: Abdomen is nondistended, soft and nontender. No organomegaly or masses felt. Normal bowel sounds heard. Central nervous system: Alert and oriented. No focal neurological deficits. Extremities: Symmetric 5 x 5 power. Skin: No  rashes, lesions or ulcers Psychiatry: Judgement and insight appear normal. Mood & affect appropriate.    Data Reviewed: I have personally reviewed following labs and imaging studies  CBC: Recent Labs  Lab 12/16/23 1232 12/17/23 0421  WBC 10.5 8.4  NEUTROABS 6.7  --   HGB 12.4 12.6  HCT 39.4 39.3  MCV 95.6 93.6  PLT 162 147*   Basic Metabolic Panel: Recent Labs  Lab 12/16/23 1232 12/17/23 0421  NA 138 140  K 4.6 3.9  CL 105 103  CO2 23 25  GLUCOSE 94 98  BUN 19 17  CREATININE 0.90 0.81  CALCIUM  9.1 9.3   GFR: Estimated Creatinine Clearance: 38 mL/min (by C-G formula based on SCr of 0.81 mg/dL). Liver Function Tests: Recent Labs  Lab 12/16/23 1232  AST 28  ALT 10  ALKPHOS 41  BILITOT 1.0  PROT 6.3*  ALBUMIN 3.6   No results for input(s): LIPASE, AMYLASE in the last 168 hours. No results for input(s): AMMONIA in the last 168 hours. Coagulation Profile: No results for input(s): INR, PROTIME in the last 168 hours. Cardiac Enzymes: No results for input(s): CKTOTAL, CKMB, CKMBINDEX, TROPONINI in the last 168 hours. BNP (last 3 results) No results for input(s): PROBNP in the last 8760 hours. HbA1C: No results for input(s): HGBA1C in the last 72 hours. CBG: No results for input(s): GLUCAP in the last 168 hours. Lipid Profile: No results for input(s): CHOL, HDL, LDLCALC, TRIG, CHOLHDL, LDLDIRECT in the last 72 hours. Thyroid  Function Tests: No results for input(s): TSH, T4TOTAL, FREET4, T3FREE, THYROIDAB in the last 72 hours. Anemia Panel: No results for input(s): VITAMINB12, FOLATE, FERRITIN, TIBC, IRON, RETICCTPCT in the last 72 hours. Sepsis Labs: No results for input(s): PROCALCITON, LATICACIDVEN in the last 168 hours.  No results found for this or any previous visit (from the past 240 hours).   Radiology Studies: CT PELVIS WO CONTRAST Result Date: 12/16/2023 CLINICAL DATA:  Low back pain  EXAM: CT PELVIS WITHOUT CONTRAST TECHNIQUE: Multidetector CT imaging of the pelvis was performed following the standard protocol without intravenous contrast. RADIATION DOSE REDUCTION: This exam was performed according to the departmental dose-optimization program which includes automated exposure control, adjustment of the mA and/or kV according to patient size and/or use of iterative reconstruction technique. COMPARISON:  Pelvic radiograph 12/30/2014 FINDINGS: Urinary Tract:  No abnormality visualized. Bowel:  Unremarkable visualized pelvic bowel loops. Vascular/Lymphatic: Aortic atherosclerosis. No aneurysm. No suspicious lymph node Reproductive:  Hysterectomy.  No adnexal mass Other:  Negative for pelvic effusion or free air. Musculoskeletal: No acute osseous abnormality. Mild SI joint degenerative change and mild bilateral no moral acetabular degenerative change. Pubic symphysis and rami appear intact IMPRESSION: 1. No acute osseous abnormality. Mild degenerative changes. 2. Aortic atherosclerosis. Aortic Atherosclerosis (ICD10-I70.0). Electronically Signed   By: Luke Bun M.D.   On: 12/16/2023 14:42   CT Lumbar Spine Wo Contrast Result Date: 12/16/2023 CLINICAL DATA:  Worsening back pain EXAM: CT LUMBAR SPINE WITHOUT CONTRAST TECHNIQUE: Multidetector CT  imaging of the lumbar spine was performed without intravenous contrast administration. Multiplanar CT image reconstructions were also generated. RADIATION DOSE REDUCTION: This exam was performed according to the departmental dose-optimization program which includes automated exposure control, adjustment of the mA and/or kV according to patient size and/or use of iterative reconstruction technique. COMPARISON:  Radiograph 04/22/2023, CT 05/23/2023 FINDINGS: Segmentation: 5 lumbar type vertebrae. Alignment: Mild levoscoliosis.  Mild kyphosis of the lumbar spine. Vertebrae: Post augmentation changes at L3 and L4 with chronic compression deformities. Interval  acute appearing fracture involving the superior aspect of L1 vertebral body with sli linear lucency present. Slight depression of the superior endplate with less than 20% loss of vertebral body height centrally. Minimal 2 mm retropulsion of the upper vertebral body. No significant bony canal stenosis. Paraspinal and other soft tissues: Mild paravertebral edema at L1. Aortic atherosclerosis. Left renal cyst, no specific imaging follow-up is recommended. Disc levels: Multilevel vacuum disc. Multilevel hypertrophic facet degenerative changes. No high-grade canal stenosis or high-grade foraminal narrowing. IMPRESSION: 1. Interval acute appearing fracture involving the superior aspect of L1 vertebral body with slight depression of the superior endplate and less than 20% loss of vertebral body height centrally. Minimal 2 mm retropulsion of the upper vertebral body. No significant bony canal stenosis. 2. Post augmentation changes at L3 and L4 with chronic compression deformities. 3. Aortic atherosclerosis. Aortic Atherosclerosis (ICD10-I70.0). Electronically Signed   By: Luke Bun M.D.   On: 12/16/2023 14:38   CT Thoracic Spine Wo Contrast Result Date: 12/16/2023 CLINICAL DATA:  Worsening back pain EXAM: CT THORACIC SPINE WITHOUT CONTRAST TECHNIQUE: Multidetector CT images of the thoracic were obtained using the standard protocol without intravenous contrast. RADIATION DOSE REDUCTION: This exam was performed according to the departmental dose-optimization program which includes automated exposure control, adjustment of the mA and/or kV according to patient size and/or use of iterative reconstruction technique. COMPARISON:  Chest CT 04/28/2022 FINDINGS: Alignment: Normal. Vertebrae: No acute fracture or focal pathologic process. Paraspinal and other soft tissues: No acute paravertebral or paraspinal soft tissue abnormality. Aortic atherosclerosis. Disc levels: Multilevel degenerative osteophytes. Mild calcification of  posterior longitudinal ligament at T7-T8. IMPRESSION: 1. No CT evidence for acute osseous abnormality. 2. Multilevel degenerative changes. 3. Aortic atherosclerosis. Aortic Atherosclerosis (ICD10-I70.0). Electronically Signed   By: Luke Bun M.D.   On: 12/16/2023 14:23    Scheduled Meds:  gabapentin   300 mg Oral QHS   heparin   5,000 Units Subcutaneous Q8H   [START ON 12/18/2023] levothyroxine   50 mcg Oral QAC breakfast   losartan   50 mg Oral Daily   melatonin  6 mg Oral QHS   mirabegron  ER  50 mg Oral Daily   pantoprazole   40 mg Oral Daily   pravastatin   20 mg Oral Daily   Continuous Infusions:   LOS: 0 days   Fredia Skeeter, MD [facility]  12/17/2023, 8:23 AM   *Please note that this is a verbal dictation therefore any spelling or grammatical errors are due to the Dragon Medical One system interpretation.  Please page via Amion and do not message via secure chat for urgent patient care matters. Secure chat can be used for non urgent patient care matters.  How to contact the [facility] Attending or Consulting provider 7A - 7P or covering provider during after hours 7P -7A, for this patient?  Check the care team in [facility] and look for a) attending/consulting [facility] provider listed and b) the [facility] team listed. Page or secure chat 7A-7P. Log into [email or web address] and  use [facility] universal password to access. If you do not have the password, please contact the hospital operator. Locate the [facility] provider you are looking for under [facility] and page to a number that you can be directly reached. If you still have difficulty reaching the provider, please page the [facility] (Director on Call) for the Hospitalists listed on amion for assistance.

## 2023-12-17 NOTE — Plan of Care (Signed)
  Problem: Skin Integrity: Goal: Risk for impaired skin integrity will decrease Outcome: Progressing   

## 2023-12-17 NOTE — Evaluation (Signed)
 Physical Therapy Evaluation Patient Details Name: Deanna Olson MRN: [id number] DOB: 02-06-1926 Today's Date: 12/17/2023  History of Present Illness  89 y.o. female being admitted to the hospital with 3 days of back pain found to have L1 compression fracture. neurosurgery consulted by ED physician,  recommended TLSO brace and ambulation with PT.    PMH: hypertension, hyperlipidemia, Alzheimer's dementia, kyphoplasty 2019  Clinical Impression  Pt admitted with above diagnosis.  Pt is mod I, amb with RW at baseline, from [facility].  Pt anxious regarding mobility, screaming with c/o pain at times during PT session. Total assist to don TLSO sitting EOB. Pt was able to progress to standing with HHA/min assist of 2 and perform stand pivot to chair. Supportive dtr present for session.   Patient will benefit from continued  follow up therapy, <3 hours/day at d/c    Pt currently with functional limitations due to the deficits listed below (see PT Problem List). Pt will benefit from acute skilled PT to increase their independence and safety with mobility to allow discharge.           If plan is discharge home, recommend the following: A lot of help with walking and/or transfers;A lot of help with bathing/dressing/bathroom;Assistance with cooking/housework;Supervision due to cognitive status;Assist for transportation;Help with stairs or ramp for entrance;Direct supervision/assist for financial management   Can travel by private vehicle   No    Equipment Recommendations None recommended by PT  Recommendations for Other Services       Functional Status Assessment Patient has had a recent decline in their functional status and demonstrates the ability to make significant improvements in function in a reasonable and predictable amount of time.     Precautions / Restrictions Precautions Precautions: Fall;Back Recall of Precautions/Restrictions: Impaired Required Braces or Orthoses: Spinal  Brace Spinal Brace: Thoracolumbosacral orthotic;Other (comment) Spinal Brace Comments: donning not specified brace at all times      Mobility  Bed Mobility Overal bed mobility: Needs Assistance Bed Mobility: Rolling, Sidelying to Sit Rolling: Min assist, Mod assist Sidelying to sit: +2 for safety/equipment, +2 for physical assistance, Min assist, Mod assist       General bed mobility comments: cues for log roll technique, assist to progress LEs off bed and elevate trunk    Transfers Overall transfer level: Needs assistance Equipment used: Rolling walker (2 wheels) Transfers: Sit to/from Stand, Bed to chair/wheelchair/BSC Sit to Stand: Min assist, +2 safety/equipment, +2 physical assistance   Step pivot transfers: Min assist, +2 physical assistance, +2 safety/equipment       General transfer comment: STS attempted with RW, pt began screaming  leaning posteriorly and scooting hips fwd on EOB, +2 assist needed to prevent pt sliding off EOB. 2nd attempt to stand with HHA of 2, with min assist to power up to stand. pt then able to take lateral steps along EOB and then stand step pivot to chair  with +2  min assist    Ambulation/Gait                  Stairs            Wheelchair Mobility     Tilt Bed    Modified Rankin (Stroke Patients Only)       Balance Overall balance assessment: Needs assistance, History of Falls         Standing balance support: Reliant on assistive device for balance, During functional activity Standing balance-Leahy Scale: Poor Standing balance comment: reliant on  support and external assist                             Pertinent Vitals/Pain Pain Assessment Pain Assessment: Faces Faces Pain Scale: Hurts worst Pain Location: back and knees Pain Descriptors / Indicators:  (screaming) Pain Intervention(s): Limited activity within patient's tolerance, Monitored during session, Premedicated before session,  Repositioned    Home Living Family/patient expects to be discharged to:: Assisted living                 Home Equipment: Agricultural consultant (2 wheels);Rollator (4 wheels);Cane - single point;Grab bars - tub/shower Additional Comments: pt is from [facility]    Prior Function Prior Level of Function : Independent/Modified Independent             Mobility Comments: Normally ambulates with RW; could ambulate in community but fatigued easily;  dtr reports pt walks very fast typically but does consistently use RW ADLs Comments: Daughter's drive; Pt independent with ADLs; Family assist with IADLs     Extremity/Trunk Assessment   Upper Extremity Assessment Upper Extremity Assessment: Defer to OT evaluation    Lower Extremity Assessment Lower Extremity Assessment: Overall WFL for tasks assessed    Cervical / Trunk Assessment Cervical / Trunk Assessment: Other exceptions Cervical / Trunk Exceptions: hx of kyphoplasty  Communication   Communication Communication: Impaired Factors Affecting Communication: Hearing impaired    Cognition Arousal: Alert Behavior During Therapy: Anxious, Impulsive   PT - Cognitive impairments: History of cognitive impairments, Safety/Judgement, Sequencing, Memory                       PT - Cognition Comments: follows one step commands with repetitous multi-modal cues         Cueing Cueing Techniques: Verbal cues, Tactile cues     General Comments      Exercises     Assessment/Plan    PT Assessment Patient needs continued PT services  PT Problem List Decreased strength;Decreased activity tolerance;Decreased balance;Decreased mobility;Decreased knowledge of use of DME;Pain;Decreased cognition;Decreased safety awareness;Decreased knowledge of precautions       PT Treatment Interventions DME instruction;Therapeutic exercise;Gait training;Functional mobility training;Therapeutic activities;Patient/family education    PT Goals  (Current goals can be found in the Care Plan section)  Acute Rehab PT Goals Patient Stated Goal: rehab then back to ALF PT Goal Formulation: With family Time For Goal Achievement: 12/31/23 Potential to Achieve Goals: Fair    Frequency Min 3X/week     Co-evaluation               AM-PAC PT 6 Clicks Mobility  Outcome Measure Help needed turning from your back to your side while in a flat bed without using bedrails?: A Little Help needed moving from lying on your back to sitting on the side of a flat bed without using bedrails?: A Lot Help needed moving to and from a bed to a chair (including a wheelchair)?: Total Help needed standing up from a chair using your arms (e.g., wheelchair or bedside chair)?: Total Help needed to walk in hospital room?: Total Help needed climbing 3-5 steps with a railing? : Total 6 Click Score: 9    End of Session Equipment Utilized During Treatment: Gait belt Activity Tolerance: Patient limited by pain Patient left: in chair;with call bell/phone within reach;with chair alarm set;with family/visitor present   PT Visit Diagnosis: Other abnormalities of gait and mobility (R26.89);Unsteadiness on feet (  R26.81)    Time: 8947-8884 PT Time Calculation (min) (ACUTE ONLY): 23 min   Charges:   PT Evaluation $PT Eval Low Complexity: 1 Low PT Treatments $Therapeutic Activity: 8-22 mins PT General Charges $$ ACUTE PT VISIT: 1 Visit         Juma Oxley, PT  Acute Rehab Dept [facility]) [phone]  12/17/2023   [facility] 12/17/2023, 11:37 AM

## 2023-12-17 NOTE — Care Management Obs Status (Signed)
 MEDICARE OBSERVATION STATUS NOTIFICATION   Patient Details  Name: Deanna Olson MRN: [id number] Date of Birth: [birth year]   Medicare Observation Status Notification Given:  Yes    Sonda Manuella Quill, RN 12/17/2023, 1:06 PM

## 2023-12-18 DIAGNOSIS — S32010A Wedge compression fracture of first lumbar vertebra, initial encounter for closed fracture: Secondary | ICD-10-CM | POA: Diagnosis not present

## 2023-12-18 NOTE — TOC Progression Note (Addendum)
 Transition of Care [facility]) - Progression Note    Patient Details  Name: Deanna Olson MRN: [id number] Date of Birth: April 17, 1926  Transition of Care [facility]) CM/SW Contact  Sonda Manuella Quill, RN Phone Number: 12/18/2023, 9:21 AM  Clinical Narrative:    PT recc SNF; spoke w/ Joeline, Med Tech at [facility]; she says facility offers memory care and assisted living; will notify family of recc.  [phone] spoke w/ pt's dtr Deanna Olson [phone]); she was notified recc for SNF, explained [facility] does not offer this level of care;  also explained SNF process, and ins auth needed; [name] says she would like to discuss care options w/ staff at facility; she also requests TOC follow up w/ her tomorrow; will pass on to oncoming TOC.  Expected Discharge Plan: Skilled Nursing Facility Barriers to Discharge: Continued Medical Work up  Expected Discharge Plan and Services   Discharge Planning Services: CM Consult   Living arrangements for the past 2 months: Assisted Living Facility                                       Social Determinants of Health (SDOH) Interventions SDOH Screenings   Food Insecurity: No Food Insecurity (12/17/2023)  Housing: Low Risk  (12/17/2023)  Transportation Needs: No Transportation Needs (12/17/2023)  Utilities: Not At Risk (12/17/2023)  Alcohol Screen: Low Risk  (02/13/2021)  Depression (PHQ2-9): Medium Risk (07/11/2023)  Financial Resource Strain: Low Risk  (12/24/2022)  Physical Activity: Inactive (12/24/2022)  Social Connections: Unknown (12/16/2023)  Stress: No Stress Concern Present (12/24/2022)  Tobacco Use: Low Risk  (12/16/2023)  Health Literacy: Adequate Health Literacy (12/24/2022)    Readmission Risk Interventions     No data to display

## 2023-12-18 NOTE — Progress Notes (Signed)
 Mobility Specialist - Progress Note   12/18/23 1528  Mobility  Activity Transferred to/from [facility];Transferred from bed to chair  Level of Assistance Standby assist, set-up cues, supervision of patient - no hands on  Assistive Device Front wheel walker  Distance Ambulated (ft) 10 ft  Activity Response Tolerated well  Mobility Referral Yes  Mobility visit 1 Mobility  Mobility Specialist Stop Time (ACUTE ONLY) 0328   Pt received in recliner requesting assistance to [facility] & then back to bed. C/o back pain during session. No other complaints during session. Pt to bed after session with all needs met.   [facility]

## 2023-12-18 NOTE — Plan of Care (Signed)
   Problem: Clinical Measurements: Goal: Will remain free from infection Outcome: Progressing Goal: Diagnostic test results will improve Outcome: Progressing

## 2023-12-18 NOTE — Progress Notes (Signed)
 PROGRESS NOTE    Deanna Olson  FMW:[id number] DOB: 17-Jul-1925 DOA: 12/16/2023 PCP: Patient, No Pcp Per   Brief Narrative:  HPI: Deanna Olson is a 89 y.o. female with medical history significant for hypertension, hyperlipidemia, Alzheimer's dementia being admitted to the hospital with 3 days of back pain found to have L1 compression fracture.  She lives at [facility] subacute nursing facility, has been having back and shoulder pain for the last 3 days, on examination she has pain in the lumbar back.  She denies any injury, denies any fevers or chills.  She has not had any new urinary incontinence, saddle anesthesia or other alarming symptoms.  She was evaluated this morning in the emergency department for this back pain, workup as detailed below shows evidence of L1 compression fracture.  TLSO brace has been placed, and hospitalist admission was requested for pain control and physical therapy.   Assessment & Plan:   Principal Problem:   Lumbar compression fracture (HCC)  L1 compression fracture-without any history of trauma.  Unable to ambulate, case discussed with Dr. Elsner/neurosurgery by ED physician, he recommended TLSO brace and ambulation with PT.  PT OT consulted.  Pain appears to be better controlled continue current pain management/oxycodone  every 4 hours as needed.  PT OT recommended SNF.  Daughter in agreement.  TOC consulted.  Patient is stable for discharge once bed is arranged.   GERD-omeprazole    Hypertension-blood pressure slightly elevated, resume losartan    Hypothyroidism-continue Synthroid    Neuropathy-continue gabapentin    Hyperlipidemia-Pravachol   DVT prophylaxis: heparin  injection 5,000 Units Start: 12/16/23 1630   Code Status: Limited: Do not attempt resuscitation (DNR) -DNR-LIMITED -Do Not Intubate/DNI   Family Communication: Daughter present at bedside.  Plan of care discussed with patient's daughter Deanna Olson at the bedside.  Status is: Inpatient Remains  inpatient appropriate because: Medically stable, pending placement to SNF.     Estimated body mass index is 28.17 kg/m as calculated from the following:   Height as of this encounter: 5' 2 (1.575 m).   Weight as of this encounter: 69.9 kg.    Nutritional Assessment: Body mass index is 28.17 kg/m.SABRA Seen by dietician.  I agree with the assessment and plan as outlined below: Nutrition Status:        . Skin Assessment: I have examined the patient's skin and I agree with the wound assessment as performed by the wound care RN as outlined below:    Consultants:  Neurosurgery consulted over the phone by ED physician at admission  Procedures:  None  Antimicrobials:  Anti-infectives (From admission, onward)    None         Subjective: And examined, sitting in the recliner.  States that she is not feeling well but she did not have any specific complaint.  She did not complain of pain.  Objective: Vitals:   12/17/23 0915 12/17/23 1349 12/17/23 2120 12/18/23 0618  BP: (!) 152/64 (!) 192/86 134/62 125/61  Pulse: 64 63 70 68  Resp: 18 16 20 18   Temp: (!) 97.4 F (36.3 C) 97.8 F (36.6 C) 97.8 F (36.6 C) (!) 97.4 F (36.3 C)  TempSrc: Oral Oral Oral Oral  SpO2: 94% 97% 96% 94%  Weight:      Height:        Intake/Output Summary (Last 24 hours) at 12/18/2023 1055 Last data filed at 12/18/2023 1000 Gross per 24 hour  Intake 800 ml  Output 550 ml  Net 250 ml   Filed Weights   12/16/23  1151  Weight: 69.9 kg    Examination:  General exam: Appears calm and comfortable  Respiratory system: Clear to auscultation. Respiratory effort normal. Cardiovascular system: S1 & S2 heard, RRR. No JVD, murmurs, rubs, gallops or clicks. No pedal edema. Gastrointestinal system: Abdomen is nondistended, soft and nontender. No organomegaly or masses felt. Normal bowel sounds heard. Central nervous system: Alert and oriented x 1.  No focal deficit.   Data Reviewed: I have  personally reviewed following labs and imaging studies  CBC: Recent Labs  Lab 12/16/23 1232 12/17/23 0421  WBC 10.5 8.4  NEUTROABS 6.7  --   HGB 12.4 12.6  HCT 39.4 39.3  MCV 95.6 93.6  PLT 162 147*   Basic Metabolic Panel: Recent Labs  Lab 12/16/23 1232 12/17/23 0421  NA 138 140  K 4.6 3.9  CL 105 103  CO2 23 25  GLUCOSE 94 98  BUN 19 17  CREATININE 0.90 0.81  CALCIUM  9.1 9.3   GFR: Estimated Creatinine Clearance: 38 mL/min (by C-G formula based on SCr of 0.81 mg/dL). Liver Function Tests: Recent Labs  Lab 12/16/23 1232  AST 28  ALT 10  ALKPHOS 41  BILITOT 1.0  PROT 6.3*  ALBUMIN 3.6   No results for input(s): LIPASE, AMYLASE in the last 168 hours. No results for input(s): AMMONIA in the last 168 hours. Coagulation Profile: No results for input(s): INR, PROTIME in the last 168 hours. Cardiac Enzymes: No results for input(s): CKTOTAL, CKMB, CKMBINDEX, TROPONINI in the last 168 hours. BNP (last 3 results) No results for input(s): PROBNP in the last 8760 hours. HbA1C: No results for input(s): HGBA1C in the last 72 hours. CBG: No results for input(s): GLUCAP in the last 168 hours. Lipid Profile: No results for input(s): CHOL, HDL, LDLCALC, TRIG, CHOLHDL, LDLDIRECT in the last 72 hours. Thyroid  Function Tests: No results for input(s): TSH, T4TOTAL, FREET4, T3FREE, THYROIDAB in the last 72 hours. Anemia Panel: No results for input(s): VITAMINB12, FOLATE, FERRITIN, TIBC, IRON, RETICCTPCT in the last 72 hours. Sepsis Labs: No results for input(s): PROCALCITON, LATICACIDVEN in the last 168 hours.  No results found for this or any previous visit (from the past 240 hours).   Radiology Studies: CT PELVIS WO CONTRAST Result Date: 12/16/2023 CLINICAL DATA:  Low back pain EXAM: CT PELVIS WITHOUT CONTRAST TECHNIQUE: Multidetector CT imaging of the pelvis was performed following the standard protocol  without intravenous contrast. RADIATION DOSE REDUCTION: This exam was performed according to the departmental dose-optimization program which includes automated exposure control, adjustment of the mA and/or kV according to patient size and/or use of iterative reconstruction technique. COMPARISON:  Pelvic radiograph 12/30/2014 FINDINGS: Urinary Tract:  No abnormality visualized. Bowel:  Unremarkable visualized pelvic bowel loops. Vascular/Lymphatic: Aortic atherosclerosis. No aneurysm. No suspicious lymph node Reproductive:  Hysterectomy.  No adnexal mass Other:  Negative for pelvic effusion or free air. Musculoskeletal: No acute osseous abnormality. Mild SI joint degenerative change and mild bilateral no moral acetabular degenerative change. Pubic symphysis and rami appear intact IMPRESSION: 1. No acute osseous abnormality. Mild degenerative changes. 2. Aortic atherosclerosis. Aortic Atherosclerosis (ICD10-I70.0). Electronically Signed   By: Luke Bun M.D.   On: 12/16/2023 14:42   CT Lumbar Spine Wo Contrast Result Date: 12/16/2023 CLINICAL DATA:  Worsening back pain EXAM: CT LUMBAR SPINE WITHOUT CONTRAST TECHNIQUE: Multidetector CT imaging of the lumbar spine was performed without intravenous contrast administration. Multiplanar CT image reconstructions were also generated. RADIATION DOSE REDUCTION: This exam was performed according to the departmental  dose-optimization program which includes automated exposure control, adjustment of the mA and/or kV according to patient size and/or use of iterative reconstruction technique. COMPARISON:  Radiograph 04/22/2023, CT 05/23/2023 FINDINGS: Segmentation: 5 lumbar type vertebrae. Alignment: Mild levoscoliosis.  Mild kyphosis of the lumbar spine. Vertebrae: Post augmentation changes at L3 and L4 with chronic compression deformities. Interval acute appearing fracture involving the superior aspect of L1 vertebral body with sli linear lucency present. Slight depression  of the superior endplate with less than 20% loss of vertebral body height centrally. Minimal 2 mm retropulsion of the upper vertebral body. No significant bony canal stenosis. Paraspinal and other soft tissues: Mild paravertebral edema at L1. Aortic atherosclerosis. Left renal cyst, no specific imaging follow-up is recommended. Disc levels: Multilevel vacuum disc. Multilevel hypertrophic facet degenerative changes. No high-grade canal stenosis or high-grade foraminal narrowing. IMPRESSION: 1. Interval acute appearing fracture involving the superior aspect of L1 vertebral body with slight depression of the superior endplate and less than 20% loss of vertebral body height centrally. Minimal 2 mm retropulsion of the upper vertebral body. No significant bony canal stenosis. 2. Post augmentation changes at L3 and L4 with chronic compression deformities. 3. Aortic atherosclerosis. Aortic Atherosclerosis (ICD10-I70.0). Electronically Signed   By: Luke Bun M.D.   On: 12/16/2023 14:38   CT Thoracic Spine Wo Contrast Result Date: 12/16/2023 CLINICAL DATA:  Worsening back pain EXAM: CT THORACIC SPINE WITHOUT CONTRAST TECHNIQUE: Multidetector CT images of the thoracic were obtained using the standard protocol without intravenous contrast. RADIATION DOSE REDUCTION: This exam was performed according to the departmental dose-optimization program which includes automated exposure control, adjustment of the mA and/or kV according to patient size and/or use of iterative reconstruction technique. COMPARISON:  Chest CT 04/28/2022 FINDINGS: Alignment: Normal. Vertebrae: No acute fracture or focal pathologic process. Paraspinal and other soft tissues: No acute paravertebral or paraspinal soft tissue abnormality. Aortic atherosclerosis. Disc levels: Multilevel degenerative osteophytes. Mild calcification of posterior longitudinal ligament at T7-T8. IMPRESSION: 1. No CT evidence for acute osseous abnormality. 2. Multilevel  degenerative changes. 3. Aortic atherosclerosis. Aortic Atherosclerosis (ICD10-I70.0). Electronically Signed   By: Luke Bun M.D.   On: 12/16/2023 14:23    Scheduled Meds:  gabapentin   100 mg Oral Daily   gabapentin   300 mg Oral QHS   heparin   5,000 Units Subcutaneous Q8H   levothyroxine   50 mcg Oral QAC breakfast   losartan   50 mg Oral Daily   melatonin  6 mg Oral QHS   mirabegron  ER  50 mg Oral Daily   pantoprazole   40 mg Oral Daily   pravastatin   20 mg Oral Daily   Continuous Infusions:   LOS: 1 day   Fredia Skeeter, MD [facility]  12/18/2023, 10:55 AM   *Please note that this is a verbal dictation therefore any spelling or grammatical errors are due to the Dragon Medical One system interpretation.  Please page via Amion and do not message via secure chat for urgent patient care matters. Secure chat can be used for non urgent patient care matters.  How to contact the [facility] Attending or Consulting provider 7A - 7P or covering provider during after hours 7P -7A, for this patient?  Check the care team in [facility] and look for a) attending/consulting [facility] provider listed and b) the [facility] team listed. Page or secure chat 7A-7P. Log into [email or web address] and use [place] universal password to access. If you do not have the password, please contact the hospital operator. Locate the [facility] provider you  are looking for under [facility] and page to a number that you can be directly reached. If you still have difficulty reaching the provider, please page the [facility] (Director on Call) for the Hospitalists listed on amion for assistance.

## 2023-12-19 DIAGNOSIS — S32010A Wedge compression fracture of first lumbar vertebra, initial encounter for closed fracture: Secondary | ICD-10-CM | POA: Diagnosis not present

## 2023-12-19 MED ORDER — ALPRAZOLAM 0.25 MG PO TABS
0.2500 mg | ORAL_TABLET | Freq: Once | ORAL | Status: AC
  Administered 2023-12-19: 0.25 mg via ORAL
  Filled 2023-12-19: qty 1

## 2023-12-19 NOTE — NC FL2 (Addendum)
 [place]  MEDICAID FL2 LEVEL OF CARE FORM     IDENTIFICATION  Patient Name: Deanna Olson Birthdate: 03-11-1926 Sex: female Admission Date (Current Location): 12/16/2023  [facility] and [place] Number:  Producer, television/film/video and Address:  [facility],  [address], [place] [address]      Provider Number: [id number]  Attending Physician Name and Address:  Vernon Ranks, MD  Relative Name and Phone Number:  daughter, Macario Dunker @ [phone]    Current Level of Care: Hospital Recommended Level of Care: Skilled Nursing Facility Prior Approval Number:    Date Approved/Denied:   PASRR Number: [id number]  Discharge Plan: SNF    Current Diagnoses: Patient Active Problem List   Diagnosis Date Noted   Lumbar compression fracture (HCC) 12/16/2023   COVID 06/13/2023   Elevated CK 04/28/2023   Alzheimer disease (HCC) 02/17/2023   OAB (overactive bladder) 02/17/2023   LBBB (left bundle branch block) 02/28/2022   Bilateral hearing loss 08/15/2020   Neuropathy 12/27/2019   Osteoporosis 02/20/2018   Cough 02/20/2018   Rash 01/27/2017   Low back pain 07/06/2016   Right knee pain 07/06/2016   Chronic kidney disease, stage 3a (HCC) 08/13/2014   Routine general medical examination at a health care facility 08/13/2014   Vitamin D  deficiency 12/02/2009   Aortic stenosis, mild 01/24/2008   DEGENERATIVE JOINT DISEASE 11/05/2007   Hypothyroidism 10/31/2007   Essential hypertension 10/31/2007   Allergic rhinitis 10/31/2007   Mixed hyperlipidemia 04/12/2007   Anxiety state 04/12/2007   GERD without esophagitis 04/12/2007    Orientation RESPIRATION BLADDER Height & Weight     Self, Place  Normal Continent Weight: 154 lb (69.9 kg) Height:  5' 2 (157.5 cm)  BEHAVIORAL SYMPTOMS/MOOD NEUROLOGICAL BOWEL NUTRITION STATUS      Continent Diet (regular)  AMBULATORY STATUS COMMUNICATION OF NEEDS Skin   Limited Assist Verbally Normal                        Personal Care Assistance Level of Assistance  Bathing, Dressing Bathing Assistance: Limited assistance   Dressing Assistance: Limited assistance     Functional Limitations Info  Sight, Hearing, Speech Sight Info: Adequate Hearing Info: Adequate Speech Info: Adequate    SPECIAL CARE FACTORS FREQUENCY  PT (By licensed PT), OT (By licensed OT)     PT Frequency: 5x/wk OT Frequency: 5x/wk            Contractures Contractures Info: Not present    Additional Factors Info  Code Status, Allergies Code Status Info: DNR Allergies Info: Sulfonamide Derivatives           Current Medications (12/19/2023):  This is the current hospital active medication list Current Facility-Administered Medications  Medication Dose Route Frequency Provider Last Rate Last Admin   acetaminophen  (TYLENOL ) tablet 650 mg  650 mg Oral Q6H PRN Zella, Mir M, MD   650 mg at 12/18/23 0554   Or   acetaminophen  (TYLENOL ) suppository 650 mg  650 mg Rectal Q6H PRN Zella, Mir M, MD       albuterol  (PROVENTIL ) (2.5 MG/3ML) 0.083% nebulizer solution 2.5 mg  2.5 mg Nebulization Q2H PRN Zella, Mir M, MD       gabapentin  (NEURONTIN ) capsule 100 mg  100 mg Oral Daily Pahwani, Ravi, MD   100 mg at 12/18/23 9152   gabapentin  (NEURONTIN ) capsule 300 mg  300 mg Oral QHS Pahwani, Ravi, MD   300 mg at 12/18/23 2136   guaiFENesin -dextromethorphan (ROBITUSSIN  DM) 100-10 MG/5ML syrup 5 mL  5 mL Oral Q4H PRN Pahwani, Ravi, MD       haloperidol  lactate (HALDOL ) injection 5 mg  5 mg Intravenous Q6H PRN Pahwani, Ravi, MD   5 mg at 12/17/23 1500   heparin  injection 5,000 Units  5,000 Units Subcutaneous Q8H Zella, Mir M, MD   5,000 Units at 12/19/23 9470   levothyroxine  (SYNTHROID ) tablet 50 mcg  50 mcg Oral QAC breakfast Vernon Ranks, MD   50 mcg at 12/19/23 0529   losartan  (COZAAR ) tablet 50 mg  50 mg Oral Daily Pahwani, Ravi, MD   50 mg at 12/18/23 0847   melatonin tablet 6 mg  6 mg Oral QHS Pahwani, Ravi,  MD   6 mg at 12/18/23 2136   mirabegron  ER (MYRBETRIQ ) tablet 50 mg  50 mg Oral Daily Pahwani, Ravi, MD   50 mg at 12/18/23 1047   ondansetron  (ZOFRAN ) tablet 4 mg  4 mg Oral Q6H PRN Zella, Mir M, MD       Or   ondansetron  (ZOFRAN ) injection 4 mg  4 mg Intravenous Q6H PRN Zella, Mir M, MD       oxyCODONE  (Oxy IR/ROXICODONE ) immediate release tablet 5 mg  5 mg Oral Q4H PRN Zella, Mir M, MD   5 mg at 12/19/23 9357   pantoprazole  (PROTONIX ) EC tablet 40 mg  40 mg Oral Daily Pahwani, Ravi, MD   40 mg at 12/18/23 0847   pravastatin  (PRAVACHOL ) tablet 20 mg  20 mg Oral Daily Pahwani, Ravi, MD   20 mg at 12/18/23 0848     Discharge Medications: Please see discharge summary for a list of discharge medications.  Relevant Imaging Results:  Relevant Lab Results:   Additional Information SSN [id number]  NORMAN ASPEN, LCSW

## 2023-12-19 NOTE — Progress Notes (Signed)
 Physical Therapy Treatment Patient Details Name: Gracia Saggese MRN: [id number] DOB: 1926-01-21 Today's Date: 12/19/2023   History of Present Illness 89 y.o. female being admitted to the hospital with 3 days of back pain found to have L1 compression fracture. neurosurgery consulted by ED physician,  recommended TLSO brace and ambulation with PT.    PMH: hypertension, hyperlipidemia, Alzheimer's dementia, kyphoplasty 2019    PT Comments  Pt with significant pain improvement this session. Pt up in recliner, asking to fix hair at mirror, pt amb to restroom with RW, min A for steadying, then able to return to recliner for seated rest break. Pt then amb in hallway ~160 ft min A to CGA for steadying, needing assist and cues to clear around furniture in hallways, recliner follow but pt not needing for seated rest break. Pt needing min A to power up from recliner, cues for weight shift and UE assist/placement to power up, slow to transition from pushing up from recliner to hands on RW. Pt remains up in recliner at EOS with all needs in reach.    If plan is discharge home, recommend the following: A little help with walking and/or transfers;A little help with bathing/dressing/bathroom;Assistance with cooking/housework;Assist for transportation   Can travel by private vehicle     No  Equipment Recommendations  None recommended by PT    Recommendations for Other Services       Precautions / Restrictions Precautions Precautions: Fall;Back Recall of Precautions/Restrictions: Impaired Required Braces or Orthoses: Spinal Brace Spinal Brace: Thoracolumbosacral orthotic;Applied in sitting position;Other (comment) Spinal Brace Comments: Brace on at all times, no doning position specified Restrictions Weight Bearing Restrictions Per Provider Order: No     Mobility  Bed Mobility               General bed mobility comments: pt up in recliner upon arrival    Transfers Overall transfer level:  Needs assistance Equipment used: Rolling walker (2 wheels) Transfers: Sit to/from Stand Sit to Stand: Min assist           General transfer comment: min A for STS reps from recliner, cues for hand placement, pt requesting therapist lift hands from recliner and place on RW but able to do so with cues and assist to steady    Ambulation/Gait Ambulation/Gait assistance: Min assist, Contact guard assist Gait Distance (Feet): 160 Feet Assistive device: Rolling walker (2 wheels) Gait Pattern/deviations: Step-through pattern, Decreased stride length, Trunk flexed Gait velocity: decreased     General Gait Details: min A to CGA for gait, increased time with turns, cues for clearing past obstacles as pt drives RW into objects in hallway despite cues, recliner follow for safety but pt not needing seated rest break   Stairs             Wheelchair Mobility     Tilt Bed    Modified Rankin (Stroke Patients Only)       Balance Overall balance assessment: Needs assistance, History of Falls         Standing balance support: Reliant on assistive device for balance, During functional activity Standing balance-Leahy Scale: Poor                              Communication Communication Communication: Impaired Factors Affecting Communication: Hearing impaired  Cognition Arousal: Alert Behavior During Therapy: WFL for tasks assessed/performed   PT - Cognitive impairments: History of cognitive impairments, Safety/Judgement, Memory  PT - Cognition Comments: pt pleasant, slightly impulsive with movement, directing therapist throughout session but open to cues, pleasantly confused asking who people are as they passed in hallway and therapist gently reoriented pt to being in hospital Following commands: Intact Following commands impaired: Only follows one step commands consistently    Cueing Cueing Techniques: Verbal cues  Exercises       General Comments        Pertinent Vitals/Pain Pain Assessment Pain Assessment: No/denies pain    Home Living                          Prior Function            PT Goals (current goals can now be found in the care plan section) Acute Rehab PT Goals Patient Stated Goal: rehab then back to ALF PT Goal Formulation: With family Time For Goal Achievement: 12/31/23 Potential to Achieve Goals: Fair Progress towards PT goals: Progressing toward goals    Frequency    Min 3X/week      PT Plan      Co-evaluation              AM-PAC PT 6 Clicks Mobility   Outcome Measure  Help needed turning from your back to your side while in a flat bed without using bedrails?: A Little Help needed moving from lying on your back to sitting on the side of a flat bed without using bedrails?: A Lot Help needed moving to and from a bed to a chair (including a wheelchair)?: A Little Help needed standing up from a chair using your arms (e.g., wheelchair or bedside chair)?: A Little Help needed to walk in hospital room?: A Little Help needed climbing 3-5 steps with a railing? : A Lot 6 Click Score: 16    End of Session Equipment Utilized During Treatment: Gait belt Activity Tolerance: Patient tolerated treatment well Patient left: in chair;with call bell/phone within reach;with chair alarm set Nurse Communication: Mobility status PT Visit Diagnosis: Other abnormalities of gait and mobility (R26.89);Unsteadiness on feet (R26.81)     Time: 8789-8766 PT Time Calculation (min) (ACUTE ONLY): 23 min  Charges:    $Gait Training: 8-22 mins $Therapeutic Activity: 8-22 mins PT General Charges $$ ACUTE PT VISIT: 1 Visit                     Tori Justn Quale PT, DPT 12/19/23, 12:49 PM

## 2023-12-19 NOTE — Progress Notes (Signed)
 Occupational Therapy Treatment Patient Details Name: Deanna Olson MRN: [id number] DOB: 1926-05-02 Today's Date: 12/19/2023   History of present illness 89 y.o. female being admitted to the hospital with 3 days of back pain found to have L1 compression fracture. neurosurgery consulted by ED physician,  recommended TLSO brace and ambulation with PT.    PMH: hypertension, hyperlipidemia, Alzheimer's dementia, kyphoplasty 2019   OT comments  Patient progressing and showed improved tolerance for in-room ambulation ~25' with RW and cues compared to previous session. Daughter present and educated on easy and supportive footwear as pt unable to retain new learning for adaptive equipment and dtr agrees that this would be too frustrating for the pt.    Patient remains limited by acute lower back pain and chronic B knee pain, hearing loss, and chronic memory loss from alzheimer's dementia affecting pt's ability to recall her back precautions and the reason she is wearing her TLSO, along with deficits noted below. Pt continues to demonstrate fair to good rehab potential and would benefit from continued skilled OT to increase safety and independence with ADLs and functional transfers to allow pt to return home safely and reduce caregiver burden and fall risk.       If plan is discharge home, recommend the following:  Two people to help with walking and/or transfers;A lot of help with bathing/dressing/bathroom;Assistance with cooking/housework;Direct supervision/assist for medications management;Direct supervision/assist for financial management;Assist for transportation;Help with stairs or ramp for entrance;Supervision due to cognitive status   Equipment Recommendations   (defer to next LOC)    Recommendations for Other Services      Precautions / Restrictions Precautions Precautions: Fall;Back Recall of Precautions/Restrictions: Impaired Required Braces or Orthoses: Spinal Brace Spinal Brace:  Thoracolumbosacral orthotic;Applied in sitting position;Other (comment) Spinal Brace Comments: Brace on at all times, no doning position specified Restrictions Weight Bearing Restrictions Per Provider Order: No       Mobility Bed Mobility               General bed mobility comments: Pt received in recliner with dauighter in room    Transfers                         Balance Overall balance assessment: Needs assistance, History of Falls Sitting-balance support: Feet supported, No upper extremity supported Sitting balance-Leahy Scale: Good     Standing balance support: Reliant on assistive device for balance, During functional activity Standing balance-Leahy Scale: Poor                             ADL either performed or assessed with clinical judgement   ADL Overall ADL's : Needs assistance/impaired Eating/Feeding: Set up;Sitting   Grooming: Wash/dry face;Oral care;Sitting;Set up       Lower Body Bathing: Maximal assistance     Upper Body Dressing Details (indicate cue type and reason): Total Assist to adjust TLSO. Noted sternal support rising and digging into pt's neck. Over the shoulder straps changes to under the shoulder to prevent this and pt reported incrased comfort. Pt forgets she is wearing at times and asks, What's this on me?   Lower Body Dressing Details (indicate cue type and reason): Pt unable to perform figure 4. Pt unable to retrain education on reacher/socks aid, etc for dressing. Pt's daughter asked for shoe recommendations which were provided with ease, comfort/support and back precautions in mind. Daugher to follow up with slip-in-sketcher stayle shoes.  Toilet Transfer: Contact guard assist;Minimal assistance;Rolling walker (2 wheels);Cueing for safety Toilet Transfer Details (indicate cue type and reason): Pt stood from Recliner with CGA, pushing from arms of chair. Pt stood from EOB without tolerating pushing off bed due to  increased pain. Pt's RW was secures to floor by OT to allow pt to up his way with Min As.  Pt ambulated with RW and CGA to the bathroom x 2 reps around room but declined actual toilet transfer due to no need to void.         Functional mobility during ADLs: Contact guard assist;Minimal assistance;Cueing for safety;Cueing for sequencing General ADL Comments: Cuing for precautions.    Extremity/Trunk Assessment Upper Extremity Assessment Upper Extremity Assessment: Overall WFL for tasks assessed;Right hand dominant   Lower Extremity Assessment Lower Extremity Assessment: Defer to PT evaluation   Cervical / Trunk Assessment Cervical / Trunk Assessment: Other exceptions Cervical / Trunk Exceptions: hx of kyphoplasty    Vision Baseline Vision/History: 1 Wears glasses Ability to See in Adequate Light: 0 Adequate Patient Visual Report: No change from baseline Additional Comments: NEeds cues at times to avoid obstacles/doorways in room with RW.   Perception     Praxis     Communication Communication Communication: Impaired Factors Affecting Communication: Hearing impaired   Cognition Arousal: Alert Behavior During Therapy: Anxious, Impulsive Cognition: History of cognitive impairments             OT - Cognition Comments: alert, O x self and hospital as well as gross idea her back pain brought her in, decreased STM and recall of back precautions. Asked x 2 what was on her (brace) despite having education on wear and adjustments just minutes prior.  Benefits from reassurance for safety, decreased insight and judgement                 Following commands: Impaired Following commands impaired: Follows one step commands inconsistently      Cueing   Cueing Techniques: Verbal cues, Tactile cues  Exercises      Shoulder Instructions       General Comments      Pertinent Vitals/ Pain       Pain Assessment Pain Assessment: Faces Faces Pain Scale: Hurts even  more Pain Location: back and knees. Back when attempting stands and knees when ambulting. Pain Descriptors / Indicators: Guarding, Grimacing, Moaning Pain Intervention(s): Limited activity within patient's tolerance, Monitored during session, Premedicated before session, Repositioned, Other (comment) (adjusted TLSO)  Home Living                                          Prior Functioning/Environment              Frequency           Progress Toward Goals  OT Goals(current goals can now be found in the care plan section)  Progress towards OT goals: Progressing toward goals  Acute Rehab OT Goals Patient Stated Goal: Per daugher, for pt to return to baseline so as not to have to increase care at ALF. OT Goal Formulation: With family Time For Goal Achievement: 12/31/23 Potential to Achieve Goals: Fair  Plan      Co-evaluation                 AM-PAC OT 6 Clicks Daily Activity     Outcome Measure   Help  from another person eating meals?: A Little Help from another person taking care of personal grooming?: A Little Help from another person toileting, which includes using toliet, bedpan, or urinal?: A Lot Help from another person bathing (including washing, rinsing, drying)?: A Lot Help from another person to put on and taking off regular upper body clothing?: A Lot Help from another person to put on and taking off regular lower body clothing?: A Lot 6 Click Score: 14    End of Session Equipment Utilized During Treatment: Gait belt;Rolling walker (2 wheels);Back brace  OT Visit Diagnosis: Unsteadiness on feet (R26.81);Cognitive communication deficit (R41.841);Pain Pain - part of body:  (Low back)   Activity Tolerance Patient limited by pain   Patient Left with call bell/phone within reach;in chair;with chair alarm set;with family/visitor present   Nurse Communication Mobility status        Time: [phone] OT Time Calculation (min): 36  min  Charges: OT General Charges $OT Visit: 1 Visit OT Treatments $Self Care/Home Management : 8-22 mins $Therapeutic Activity: 8-22 mins  Delon, OT Acute Rehab Services Office: [phone] 12/19/2023   Delon Falter 12/19/2023, 10:17 AM

## 2023-12-19 NOTE — Plan of Care (Signed)
   Problem: Nutrition: Goal: Adequate nutrition will be maintained Outcome: Progressing   Problem: Pain Managment: Goal: General experience of comfort will improve and/or be controlled Outcome: Progressing   Problem: Safety: Goal: Ability to remain free from injury will improve Outcome: Progressing

## 2023-12-19 NOTE — TOC Progression Note (Signed)
 Transition of Care [facility]) - Progression Note    Patient Details  Name: Deanna Olson MRN: [id number] Date of Birth: 1926-06-01  [facility] CM/SW Contact  NORMAN ASPEN, LCSW Phone Number: 12/19/2023, 1:54 PM  Clinical Narrative:     Met with pt and daughter, Macario, this morning and confirming plan for SNF.  Bed search initiated as well as insurance authorization with [employer].    Expected Discharge Plan: Skilled Nursing Facility Barriers to Discharge: Continued Medical Work up  Expected Discharge Plan and Services   Discharge Planning Services: CM Consult   Living arrangements for the past 2 months: Assisted Living Facility                                       Social Determinants of Health (SDOH) Interventions SDOH Screenings   Food Insecurity: No Food Insecurity (12/17/2023)  Housing: Low Risk  (12/17/2023)  Transportation Needs: No Transportation Needs (12/17/2023)  Utilities: Not At Risk (12/17/2023)  Alcohol Screen: Low Risk  (02/13/2021)  Depression (PHQ2-9): Medium Risk (07/11/2023)  Financial Resource Strain: Low Risk  (12/24/2022)  Physical Activity: Inactive (12/24/2022)  Social Connections: Unknown (12/16/2023)  Stress: No Stress Concern Present (12/24/2022)  Tobacco Use: Low Risk  (12/16/2023)  Health Literacy: Adequate Health Literacy (12/24/2022)    Readmission Risk Interventions     No data to display

## 2023-12-19 NOTE — Progress Notes (Signed)
 PROGRESS NOTE    Deanna Olson  FMW:[id number] DOB: 09/11/1925 DOA: 12/16/2023 PCP: Patient, No Pcp Per   Brief Narrative:  HPI: Deanna Olson is a 89 y.o. female with medical history significant for hypertension, hyperlipidemia, Alzheimer's dementia being admitted to the hospital with 3 days of back pain found to have L1 compression fracture.  She lives at [facility], has been having back and shoulder pain for the last 3 days, on examination she has pain in the lumbar back.  She denies any injury, denies any fevers or chills.  She has not had any new urinary incontinence, saddle anesthesia or other alarming symptoms.  She was evaluated this morning in the emergency department for this back pain, workup as detailed below shows evidence of L1 compression fracture.  TLSO brace has been placed, and hospitalist admission was requested for pain control and physical therapy.   Assessment & Plan:   Principal Problem:   Lumbar compression fracture (HCC)  L1 compression fracture-without any history of trauma.  Unable to ambulate, case discussed with Dr. Elsner/neurosurgery by ED physician, he recommended TLSO brace and ambulation with PT.  PT OT consulted.  Pain appears to be better controlled with current management/oxycodone  every 4 hours as needed.  PT OT recommended SNF.  Daughter in agreement.  TOC consulted.    GERD-omeprazole    Hypertension-blood pressure slightly elevated, resume losartan    Hypothyroidism-continue Synthroid    Neuropathy-continue gabapentin    Hyperlipidemia-Pravachol   DVT prophylaxis: heparin  injection 5,000 Units Start: 12/16/23 1630   Code Status: Limited: Do not attempt resuscitation (DNR) -DNR-LIMITED -Do Not Intubate/DNI   Family Communication: Daughter present at bedside.  Plan of care discussed with patient's daughter Macario at the bedside.  Status is: Inpatient Remains inpatient appropriate because: Medically stable, pending placement to  SNF.     Estimated body mass index is 28.17 kg/m as calculated from the following:   Height as of this encounter: 5' 2 (1.575 m).   Weight as of this encounter: 69.9 kg.    Nutritional Assessment: Body mass index is 28.17 kg/m.SABRA Seen by dietician.  I agree with the assessment and plan as outlined below: Nutrition Status:        . Skin Assessment: I have examined the patient's skin and I agree with the wound assessment as performed by the wound care RN as outlined below:    Consultants:  Neurosurgery consulted over the phone by ED physician at admission  Procedures:  None  Antimicrobials:  Anti-infectives (From admission, onward)    None         Subjective: Seen and examined, sitting in the recliner, working with PT.  Patient has no complaints.  Daughter at the bedside.  Objective: Vitals:   12/18/23 0618 12/18/23 1339 12/18/23 2045 12/19/23 0547  BP: 125/61 116/61 (!) 140/80 (!) 149/66  Pulse: 68 71 73 64  Resp: 18 18 17 20   Temp: (!) 97.4 F (36.3 C) 98.6 F (37 C) 98.2 F (36.8 C) 97.9 F (36.6 C)  TempSrc: Oral Oral Oral Oral  SpO2: 94% 96% 95% 95%  Weight:      Height:        Intake/Output Summary (Last 24 hours) at 12/19/2023 1055 Last data filed at 12/19/2023 0852 Gross per 24 hour  Intake 850 ml  Output 1175 ml  Net -325 ml   Filed Weights   12/16/23 1151  Weight: 69.9 kg    Examination:  General exam: Appears calm and comfortable  Respiratory system: Clear  to auscultation. Respiratory effort normal. Cardiovascular system: S1 & S2 heard, RRR. No JVD, murmurs, rubs, gallops or clicks. No pedal edema. Gastrointestinal system: Abdomen is nondistended, soft and nontender. No organomegaly or masses felt. Normal bowel sounds heard. Central nervous system: Alert and oriented x 1. No focal neurological deficits. Extremities: Symmetric 5 x 5 power. Skin: No rashes, lesions or ulcers.    Data Reviewed: I have personally reviewed following  labs and imaging studies  CBC: Recent Labs  Lab 12/16/23 1232 12/17/23 0421  WBC 10.5 8.4  NEUTROABS 6.7  --   HGB 12.4 12.6  HCT 39.4 39.3  MCV 95.6 93.6  PLT 162 147*   Basic Metabolic Panel: Recent Labs  Lab 12/16/23 1232 12/17/23 0421  NA 138 140  K 4.6 3.9  CL 105 103  CO2 23 25  GLUCOSE 94 98  BUN 19 17  CREATININE 0.90 0.81  CALCIUM  9.1 9.3   GFR: Estimated Creatinine Clearance: 38 mL/min (by C-G formula based on SCr of 0.81 mg/dL). Liver Function Tests: Recent Labs  Lab 12/16/23 1232  AST 28  ALT 10  ALKPHOS 41  BILITOT 1.0  PROT 6.3*  ALBUMIN 3.6   No results for input(s): LIPASE, AMYLASE in the last 168 hours. No results for input(s): AMMONIA in the last 168 hours. Coagulation Profile: No results for input(s): INR, PROTIME in the last 168 hours. Cardiac Enzymes: No results for input(s): CKTOTAL, CKMB, CKMBINDEX, TROPONINI in the last 168 hours. BNP (last 3 results) No results for input(s): PROBNP in the last 8760 hours. HbA1C: No results for input(s): HGBA1C in the last 72 hours. CBG: No results for input(s): GLUCAP in the last 168 hours. Lipid Profile: No results for input(s): CHOL, HDL, LDLCALC, TRIG, CHOLHDL, LDLDIRECT in the last 72 hours. Thyroid  Function Tests: No results for input(s): TSH, T4TOTAL, FREET4, T3FREE, THYROIDAB in the last 72 hours. Anemia Panel: No results for input(s): VITAMINB12, FOLATE, FERRITIN, TIBC, IRON, RETICCTPCT in the last 72 hours. Sepsis Labs: No results for input(s): PROCALCITON, LATICACIDVEN in the last 168 hours.  No results found for this or any previous visit (from the past 240 hours).   Radiology Studies: No results found.   Scheduled Meds:  gabapentin   100 mg Oral Daily   gabapentin   300 mg Oral QHS   heparin   5,000 Units Subcutaneous Q8H   levothyroxine   50 mcg Oral QAC breakfast   losartan   50 mg Oral Daily   melatonin  6 mg Oral  QHS   mirabegron  ER  50 mg Oral Daily   pantoprazole   40 mg Oral Daily   pravastatin   20 mg Oral Daily   Continuous Infusions:   LOS: 2 days   Fredia Skeeter, MD [facility]  12/19/2023, 10:55 AM   *Please note that this is a verbal dictation therefore any spelling or grammatical errors are due to the Dragon Medical One system interpretation.  Please page via Amion and do not message via secure chat for urgent patient care matters. Secure chat can be used for non urgent patient care matters.  How to contact the [facility] Attending or Consulting provider 7A - 7P or covering provider during after hours 7P -7A, for this patient?  Check the care team in [facility] and look for a) attending/consulting [facility] provider listed and b) the [facility] team listed. Page or secure chat 7A-7P. Log into [email or web address] and use [place]'s universal password to access. If you do not have the password, please contact the hospital operator.  Locate the [facility] provider you are looking for under [facility] and page to a number that you can be directly reached. If you still have difficulty reaching the provider, please page the [facility] (Director on Call) for the Hospitalists listed on amion for assistance.

## 2023-12-20 DIAGNOSIS — F419 Anxiety disorder, unspecified: Secondary | ICD-10-CM | POA: Diagnosis not present

## 2023-12-20 DIAGNOSIS — S32010D Wedge compression fracture of first lumbar vertebra, subsequent encounter for fracture with routine healing: Secondary | ICD-10-CM | POA: Diagnosis not present

## 2023-12-20 DIAGNOSIS — E785 Hyperlipidemia, unspecified: Secondary | ICD-10-CM | POA: Diagnosis not present

## 2023-12-20 DIAGNOSIS — I10 Essential (primary) hypertension: Secondary | ICD-10-CM | POA: Diagnosis not present

## 2023-12-20 DIAGNOSIS — S32010A Wedge compression fracture of first lumbar vertebra, initial encounter for closed fracture: Secondary | ICD-10-CM | POA: Diagnosis not present

## 2023-12-20 DIAGNOSIS — N3281 Overactive bladder: Secondary | ICD-10-CM | POA: Diagnosis not present

## 2023-12-20 DIAGNOSIS — R278 Other lack of coordination: Secondary | ICD-10-CM | POA: Diagnosis not present

## 2023-12-20 DIAGNOSIS — M6281 Muscle weakness (generalized): Secondary | ICD-10-CM | POA: Diagnosis not present

## 2023-12-20 DIAGNOSIS — E039 Hypothyroidism, unspecified: Secondary | ICD-10-CM | POA: Diagnosis not present

## 2023-12-20 DIAGNOSIS — M549 Dorsalgia, unspecified: Secondary | ICD-10-CM | POA: Diagnosis not present

## 2023-12-20 DIAGNOSIS — R2689 Other abnormalities of gait and mobility: Secondary | ICD-10-CM | POA: Diagnosis not present

## 2023-12-20 DIAGNOSIS — N1831 Chronic kidney disease, stage 3a: Secondary | ICD-10-CM | POA: Diagnosis not present

## 2023-12-20 DIAGNOSIS — M810 Age-related osteoporosis without current pathological fracture: Secondary | ICD-10-CM | POA: Diagnosis not present

## 2023-12-20 DIAGNOSIS — G629 Polyneuropathy, unspecified: Secondary | ICD-10-CM | POA: Diagnosis not present

## 2023-12-20 DIAGNOSIS — G309 Alzheimer's disease, unspecified: Secondary | ICD-10-CM | POA: Diagnosis not present

## 2023-12-20 MED ORDER — ALPRAZOLAM 0.5 MG PO TABS: 0.5000 mg | ORAL_TABLET | Freq: Two times a day (BID) | ORAL | 5 refills | Status: DC

## 2023-12-20 MED ORDER — OXYCODONE HCL 5 MG PO TABS: 5.0000 mg | ORAL_TABLET | ORAL | 0 refills | Status: DC | PRN

## 2023-12-20 NOTE — TOC Transition Note (Signed)
 Transition of Care [facility]) - Discharge Note   Patient Details  Name: Deanna Olson MRN: [id number] Date of Birth: [birth year]  Transition of Care [facility]) CM/SW Contact:  NORMAN ASPEN, LCSW Phone Number: 12/20/2023, 2:23 PM   Clinical Narrative:     Reviewed SNF bed offers with pt/ daughter and bed accepted at [facility].  Facility able to admit patient today and pt is medically cleared for discharge.  Have received insurance authorization for SNF (# [id number]), however, denied authorization for PTAR transport. Daughter aware and will be providing transportation for pt to SNF this afternoon (anticipates ~ 3:30pm).  Have alerted both RN and SNF.  No further TOC needs.  Final next level of care: Skilled Nursing Facility Barriers to Discharge: Barriers Resolved   Patient Goals and CMS Choice Patient states their goals for this hospitalization and ongoing recovery are:: SNF prior to return to [facility] CMS Medicare.gov Compare Post Acute Care list provided to:: Patient Represenative (must comment) Floria Sor (dtr))    ownership interest in [facility].provided to:: Adult Children    Discharge Placement PASRR number recieved: 12/19/23            Patient chooses bed at: [facility] Patient to be transferred to facility by: daughter Name of family member notified: daughter    Discharge Plan and Services Additional resources added to the After Visit Summary for     Discharge Planning Services: CM Consult            DME Arranged: N/A DME Agency: NA                  Social Drivers of Health (SDOH) Interventions SDOH Screenings   Food Insecurity: No Food Insecurity (12/17/2023)  Housing: Low Risk  (12/17/2023)  Transportation Needs: No Transportation Needs (12/17/2023)  Utilities: Not At Risk (12/17/2023)  Alcohol Screen: Low Risk  (02/13/2021)  Depression (PHQ2-9): Medium Risk (07/11/2023)  Financial Resource Strain: Low Risk  (12/24/2022)  Physical Activity: Inactive  (12/24/2022)  Social Connections: Unknown (12/16/2023)  Stress: No Stress Concern Present (12/24/2022)  Tobacco Use: Low Risk  (12/16/2023)  Health Literacy: Adequate Health Literacy (12/24/2022)     Readmission Risk Interventions     No data to display

## 2023-12-20 NOTE — Discharge Summary (Signed)
 Physician Discharge Summary  Deanna Olson FMW:[id number] DOB: [birth year] DOA: 12/16/2023  PCP: Patient, No Pcp Per  Admit date: 12/16/2023 Discharge date: 12/20/2023 30 Day Unplanned Readmission Risk Score    Flowsheet Row ED to Hosp-Admission (Current) from 12/16/2023 in [facility]  30 Day Unplanned Readmission Risk Score (%) 11.99 Filed at 12/20/2023 0801    This score is the patient's risk of an unplanned readmission within 30 days of being discharged (0 -100%). The score is based on dignosis, age, lab data, medications, orders, and past utilization.   Low:  0-14.9   Medium: 15-21.9   High: 22-29.9   Extreme: 30 and above          Admitted From: Assisted living facility Disposition: SNF  Recommendations for Outpatient Follow-up:  Follow up with PCP in 1-2 weeks Please obtain BMP/CBC in one week Follow-up with neurosurgery in 1 month Please follow up with your PCP on the following pending results: Unresulted Labs (From admission, onward)    None         Home Health: None Equipment/Devices: TLSO brace  Discharge Condition: Stable CODE STATUS: DNR Diet recommendation: Cardiac  Subjective: Seen and examined, alert and oriented x 2 which is at her baseline.  Daughter at the bedside.  Patient has no complaints at all.  Daughter in agreement with discharge to SNF today.  Brief/Interim Summary:  Deanna Olson is a 89 y.o. female with medical history significant for hypertension, hyperlipidemia, Alzheimer's dementia admitted to the hospital with 3 days of back pain found to have L1 compression fracture.  She lives at [facility] nursing facility, has been having back and shoulder pain for the last 3 days, on examination she has pain in the lumbar back.  She denies any injury, denies any fevers or chills.  She has not had any new urinary incontinence, saddle anesthesia or other alarming symptoms.  She was evaluated in emergency department for this back pain,  workup shows evidence of L1 compression fracture.  TLSO brace has been placed, and hospitalist admission was requested for pain control and physical therapy.    L1 compression fracture-without any history of trauma.  Unable to ambulate, case discussed with Dr. Elsner/neurosurgery by ED physician, he recommended TLSO brace and ambulation with PT.  PT OT consulted.  Pain appears to be better controlled with current management/oxycodone  every 4 hours as needed.  PT OT recommended SNF.  Daughter in agreement.  TOC consulted.  Patient will be discharged today to SNF.  Recommend follow-up with neurosurgery in 1 month.   GERD-omeprazole    Hypertension-blood pressure slightly elevated, resume losartan    Hypothyroidism-continue Synthroid    Neuropathy-continue gabapentin    Hyperlipidemia-Pravachol   Discharge plan was discussed with patient and/or family member and they verbalized understanding and agreed with it.  Discharge Diagnoses:  Principal Problem:   Lumbar compression fracture [facility])    Discharge Instructions   Allergies as of 12/20/2023       Reactions   Sulfonamide Derivatives Swelling, Other (See Comments)   Angioedema         Medication List     TAKE these medications    acetaminophen  325 MG tablet Commonly known as: TYLENOL  Take 650 mg by mouth in the morning and at bedtime.   albuterol  108 (90 Base) MCG/ACT inhaler Commonly known as: VENTOLIN  HFA Inhale 2 puffs into the lungs every 6 (six) hours as needed for wheezing or shortness of breath.   ALPRAZolam  0.5 MG tablet Commonly known as: XANAX   Take 1 tablet (0.5 mg total) by mouth 2 (two) times daily. What changed:  how much to take when to take this additional instructions   betamethasone  dipropionate 0.05 % lotion APPLY TOPICALLY DAILY AS NEEDED USES ON SCALP AND EARS What changed:  how much to take reasons to take this additional instructions   diclofenac  Sodium 1 % Gel Commonly known as:  VOLTAREN  Apply 4 g topically in the morning, at noon, and at bedtime.   diphenhydramine-acetaminophen  25-500 MG Tabs tablet Commonly known as: TYLENOL  PM Take 1 tablet by mouth at bedtime as needed.   gabapentin  300 MG capsule Commonly known as: NEURONTIN  TAKE 1 CAPSULE BY MOUTH EVERY NIGHT AT BEDTIME   gabapentin  100 MG capsule Commonly known as: NEURONTIN  TAKE 1 CAPSULE BY MOUTH EVERY MORNING   guaiFENesin -dextromethorphan 100-10 MG/5ML syrup Commonly known as: ROBITUSSIN DM Take 5 mLs by mouth every 4 (four) hours as needed for cough.   ketoconazole  2 % cream Commonly known as: NIZORAL  APPLY ONE APPLICATION TOPICALLY DAILY   levothyroxine  50 MCG tablet Commonly known as: SYNTHROID  TAKE 1 TABLET BY MOUTH DAILY BEFORE BREAKFAST   losartan  50 MG tablet Commonly known as: COZAAR  TAKE 1 TABLET BY MOUTH DAILY   melatonin 3 MG Tabs tablet Take 6 mg by mouth at bedtime.   Myrbetriq  50 MG Tb24 tablet Generic drug: mirabegron  ER TAKE 1 TABLET BY MOUTH DAILY **NEED APPOINTMENT FOR REFILLS**   omeprazole  20 MG capsule Commonly known as: PRILOSEC TAKE 1 CAPSULE BY MOUTH DAILY   oxyCODONE  5 MG immediate release tablet Commonly known as: Oxy IR/ROXICODONE  Take 1 tablet (5 mg total) by mouth every 4 (four) hours as needed for moderate pain (pain score 4-6).   pravastatin  20 MG tablet Commonly known as: PRAVACHOL  TAKE 1 TABLET BY MOUTH DAILY   PreserVision AREDS 2 Caps Take 1 capsule by mouth in the morning and at bedtime.   Systane Ultra 0.4-0.3 % Soln Generic drug: Polyethyl Glycol-Propyl Glycol Place 1 drop into both eyes in the morning and at bedtime.   VITAMIN D -3 PO Take 400 Units by mouth daily after breakfast.   XyliMelts 550 MG Disk Generic drug: Xylitol Take 1 tablet by mouth at bedtime.        Follow-up Information     PCP Follow up in 1 week(s).                 Allergies  Allergen Reactions   Sulfonamide Derivatives Swelling and Other  (See Comments)    Angioedema     Consultations: Neurosurgery   Procedures/Studies: CT PELVIS WO CONTRAST Result Date: 12/16/2023 CLINICAL DATA:  Low back pain EXAM: CT PELVIS WITHOUT CONTRAST TECHNIQUE: Multidetector CT imaging of the pelvis was performed following the standard protocol without intravenous contrast. RADIATION DOSE REDUCTION: This exam was performed according to the departmental dose-optimization program which includes automated exposure control, adjustment of the mA and/or kV according to patient size and/or use of iterative reconstruction technique. COMPARISON:  Pelvic radiograph 12/30/2014 FINDINGS: Urinary Tract:  No abnormality visualized. Bowel:  Unremarkable visualized pelvic bowel loops. Vascular/Lymphatic: Aortic atherosclerosis. No aneurysm. No suspicious lymph node Reproductive:  Hysterectomy.  No adnexal mass Other:  Negative for pelvic effusion or free air. Musculoskeletal: No acute osseous abnormality. Mild SI joint degenerative change and mild bilateral no moral acetabular degenerative change. Pubic symphysis and rami appear intact IMPRESSION: 1. No acute osseous abnormality. Mild degenerative changes. 2. Aortic atherosclerosis. Aortic Atherosclerosis (ICD10-I70.0). Electronically Signed   By: Luke Bun  M.D.   On: 12/16/2023 14:42   CT Lumbar Spine Wo Contrast Result Date: 12/16/2023 CLINICAL DATA:  Worsening back pain EXAM: CT LUMBAR SPINE WITHOUT CONTRAST TECHNIQUE: Multidetector CT imaging of the lumbar spine was performed without intravenous contrast administration. Multiplanar CT image reconstructions were also generated. RADIATION DOSE REDUCTION: This exam was performed according to the departmental dose-optimization program which includes automated exposure control, adjustment of the mA and/or kV according to patient size and/or use of iterative reconstruction technique. COMPARISON:  Radiograph 04/22/2023, CT 05/23/2023 FINDINGS: Segmentation: 5 lumbar type  vertebrae. Alignment: Mild levoscoliosis.  Mild kyphosis of the lumbar spine. Vertebrae: Post augmentation changes at L3 and L4 with chronic compression deformities. Interval acute appearing fracture involving the superior aspect of L1 vertebral body with sli linear lucency present. Slight depression of the superior endplate with less than 20% loss of vertebral body height centrally. Minimal 2 mm retropulsion of the upper vertebral body. No significant bony canal stenosis. Paraspinal and other soft tissues: Mild paravertebral edema at L1. Aortic atherosclerosis. Left renal cyst, no specific imaging follow-up is recommended. Disc levels: Multilevel vacuum disc. Multilevel hypertrophic facet degenerative changes. No high-grade canal stenosis or high-grade foraminal narrowing. IMPRESSION: 1. Interval acute appearing fracture involving the superior aspect of L1 vertebral body with slight depression of the superior endplate and less than 20% loss of vertebral body height centrally. Minimal 2 mm retropulsion of the upper vertebral body. No significant bony canal stenosis. 2. Post augmentation changes at L3 and L4 with chronic compression deformities. 3. Aortic atherosclerosis. Aortic Atherosclerosis (ICD10-I70.0). Electronically Signed   By: Luke Bun M.D.   On: 12/16/2023 14:38   CT Thoracic Spine Wo Contrast Result Date: 12/16/2023 CLINICAL DATA:  Worsening back pain EXAM: CT THORACIC SPINE WITHOUT CONTRAST TECHNIQUE: Multidetector CT images of the thoracic were obtained using the standard protocol without intravenous contrast. RADIATION DOSE REDUCTION: This exam was performed according to the departmental dose-optimization program which includes automated exposure control, adjustment of the mA and/or kV according to patient size and/or use of iterative reconstruction technique. COMPARISON:  Chest CT 04/28/2022 FINDINGS: Alignment: Normal. Vertebrae: No acute fracture or focal pathologic process. Paraspinal and  other soft tissues: No acute paravertebral or paraspinal soft tissue abnormality. Aortic atherosclerosis. Disc levels: Multilevel degenerative osteophytes. Mild calcification of posterior longitudinal ligament at T7-T8. IMPRESSION: 1. No CT evidence for acute osseous abnormality. 2. Multilevel degenerative changes. 3. Aortic atherosclerosis. Aortic Atherosclerosis (ICD10-I70.0). Electronically Signed   By: Luke Bun M.D.   On: 12/16/2023 14:23     Discharge Exam: Vitals:   12/19/23 2021 12/20/23 0621  BP: (!) 151/67 (!) 154/80  Pulse: 64 79  Resp: 20 20  Temp: 98.6 F (37 C) (!) 97.3 F (36.3 C)  SpO2: 94% 97%   Vitals:   12/19/23 0547 12/19/23 1300 12/19/23 2021 12/20/23 0621  BP: (!) 149/66 127/74 (!) 151/67 (!) 154/80  Pulse: 64 62 64 79  Resp: 20 18 20 20   Temp: 97.9 F (36.6 C) 97.6 F (36.4 C) 98.6 F (37 C) (!) 97.3 F (36.3 C)  TempSrc: Oral Oral Oral Oral  SpO2: 95% 95% 94% 97%  Weight:      Height:        General: Pt is alert, awake, not in acute distress Cardiovascular: RRR, S1/S2 +, no rubs, no gallops Respiratory: CTA bilaterally, no wheezing, no rhonchi Abdominal: Soft, NT, ND, bowel sounds + Extremities: no edema, no cyanosis    The results of significant diagnostics from this hospitalization (  including imaging, microbiology, ancillary and laboratory) are listed below for reference.     Microbiology: No results found for this or any previous visit (from the past 240 hours).   Labs: BNP (last 3 results) No results for input(s): BNP in the last 8760 hours. Basic Metabolic Panel: Recent Labs  Lab 12/16/23 1232 12/17/23 0421  NA 138 140  K 4.6 3.9  CL 105 103  CO2 23 25  GLUCOSE 94 98  BUN 19 17  CREATININE 0.90 0.81  CALCIUM  9.1 9.3   Liver Function Tests: Recent Labs  Lab 12/16/23 1232  AST 28  ALT 10  ALKPHOS 41  BILITOT 1.0  PROT 6.3*  ALBUMIN 3.6   No results for input(s): LIPASE, AMYLASE in the last 168 hours. No  results for input(s): AMMONIA in the last 168 hours. CBC: Recent Labs  Lab 12/16/23 1232 12/17/23 0421  WBC 10.5 8.4  NEUTROABS 6.7  --   HGB 12.4 12.6  HCT 39.4 39.3  MCV 95.6 93.6  PLT 162 147*   Cardiac Enzymes: No results for input(s): CKTOTAL, CKMB, CKMBINDEX, TROPONINI in the last 168 hours. BNP: Invalid input(s): POCBNP CBG: No results for input(s): GLUCAP in the last 168 hours. D-Dimer No results for input(s): DDIMER in the last 72 hours. Hgb A1c No results for input(s): HGBA1C in the last 72 hours. Lipid Profile No results for input(s): CHOL, HDL, LDLCALC, TRIG, CHOLHDL, LDLDIRECT in the last 72 hours. Thyroid  function studies No results for input(s): TSH, T4TOTAL, T3FREE, THYROIDAB in the last 72 hours.  Invalid input(s): FREET3 Anemia work up No results for input(s): VITAMINB12, FOLATE, FERRITIN, TIBC, IRON, RETICCTPCT in the last 72 hours. Urinalysis    Component Value Date/Time   COLORURINE STRAW (A) 12/16/2023 1232   APPEARANCEUR CLEAR 12/16/2023 1232   LABSPEC 1.008 12/16/2023 1232   PHURINE 5.0 12/16/2023 1232   GLUCOSEU NEGATIVE 12/16/2023 1232   GLUCOSEU NEGATIVE 02/28/2018 1356   HGBUR NEGATIVE 12/16/2023 1232   BILIRUBINUR NEGATIVE 12/16/2023 1232   BILIRUBINUR neg 03/13/2016 1123   KETONESUR NEGATIVE 12/16/2023 1232   PROTEINUR NEGATIVE 12/16/2023 1232   UROBILINOGEN 0.2 02/28/2018 1356   NITRITE NEGATIVE 12/16/2023 1232   LEUKOCYTESUR NEGATIVE 12/16/2023 1232   Sepsis Labs Recent Labs  Lab 12/16/23 1232 12/17/23 0421  WBC 10.5 8.4   Microbiology No results found for this or any previous visit (from the past 240 hours).  FURTHER DISCHARGE INSTRUCTIONS:   Get Medicines reviewed and adjusted: Please take all your medications with you for your next visit with your Primary MD   Laboratory/radiological data: Please request your Primary MD to go over all hospital tests and  procedure/radiological results at the follow up, please ask your Primary MD to get all Hospital records sent to his/her office.   In some cases, they will be blood work, cultures and biopsy results pending at the time of your discharge. Please request that your primary care M.D. goes through all the records of your hospital data and follows up on these results.   Also Note the following: If you experience worsening of your admission symptoms, develop shortness of breath, life threatening emergency, suicidal or homicidal thoughts you must seek medical attention immediately by calling 911 or calling your MD immediately  if symptoms less severe.   You must read complete instructions/literature along with all the possible adverse reactions/side effects for all the Medicines you take and that have been prescribed to you. Take any new Medicines after you have completely understood  and accpet all the possible adverse reactions/side effects.    patient was instructed, not to drive, operate heavy machinery, perform activities at heights, swimming or participation in water activities or provide baby-sitting services while on Pain, Sleep and Anxiety Medications; until their outpatient Physician has advised to do so again. Also recommended to not to take more than prescribed Pain, Sleep and Anxiety Medications.  It is not advisable to combine anxiety, sleep and pain medications without talking with your primary care provider.     Wear Seat belts while driving.   Please note: You were cared for by a hospitalist during your hospital stay. Once you are discharged, your primary care physician will handle any further medical issues. Please note that NO REFILLS for any discharge medications will be authorized once you are discharged, as it is imperative that you return to your primary care physician (or establish a relationship with a primary care physician if you do not have one) for your post hospital discharge needs  so that they can reassess your need for medications and monitor your lab values  Time coordinating discharge: Over 30 minutes  SIGNED:   Fredia Skeeter, MD  [facility] 12/20/2023, 10:01 AM *Please note that this is a verbal dictation therefore any spelling or grammatical errors are due to the Dragon [employer] One system interpretation. If 7PM-7AM, please contact night-coverage [email or web address]

## 2023-12-20 NOTE — Plan of Care (Signed)
   Problem: Clinical Measurements: Goal: Will remain free from infection Outcome: Progressing Goal: Diagnostic test results will improve Outcome: Progressing

## 2023-12-20 NOTE — Progress Notes (Signed)
 Patient was taken to [facility] for rehab by her daughter. She was given the packet for the facility and was stable for transfer. She was taken to the main exit by wheelchair.   Report was given to the receiving RN at [facility] prior to the patient's discharge, and all questions were answered.

## 2023-12-22 DIAGNOSIS — I10 Essential (primary) hypertension: Secondary | ICD-10-CM | POA: Diagnosis not present

## 2023-12-22 DIAGNOSIS — E039 Hypothyroidism, unspecified: Secondary | ICD-10-CM | POA: Diagnosis not present

## 2023-12-22 DIAGNOSIS — M549 Dorsalgia, unspecified: Secondary | ICD-10-CM | POA: Diagnosis not present

## 2023-12-29 DIAGNOSIS — E039 Hypothyroidism, unspecified: Secondary | ICD-10-CM | POA: Diagnosis not present

## 2023-12-29 DIAGNOSIS — S32010D Wedge compression fracture of first lumbar vertebra, subsequent encounter for fracture with routine healing: Secondary | ICD-10-CM | POA: Diagnosis not present

## 2023-12-29 DIAGNOSIS — F0280 Dementia in other diseases classified elsewhere without behavioral disturbance: Secondary | ICD-10-CM | POA: Diagnosis not present

## 2023-12-29 DIAGNOSIS — F02C0 Dementia in other diseases classified elsewhere, severe, without behavioral disturbance, psychotic disturbance, mood disturbance, and anxiety: Secondary | ICD-10-CM | POA: Diagnosis not present

## 2023-12-29 DIAGNOSIS — N183 Chronic kidney disease, stage 3 unspecified: Secondary | ICD-10-CM | POA: Diagnosis not present

## 2023-12-29 DIAGNOSIS — K219 Gastro-esophageal reflux disease without esophagitis: Secondary | ICD-10-CM | POA: Diagnosis not present

## 2023-12-29 DIAGNOSIS — F411 Generalized anxiety disorder: Secondary | ICD-10-CM | POA: Diagnosis not present

## 2023-12-29 DIAGNOSIS — G309 Alzheimer's disease, unspecified: Secondary | ICD-10-CM | POA: Diagnosis not present

## 2023-12-29 DIAGNOSIS — I129 Hypertensive chronic kidney disease with stage 1 through stage 4 chronic kidney disease, or unspecified chronic kidney disease: Secondary | ICD-10-CM | POA: Diagnosis not present

## 2023-12-30 DIAGNOSIS — G309 Alzheimer's disease, unspecified: Secondary | ICD-10-CM | POA: Diagnosis not present

## 2023-12-30 DIAGNOSIS — S32010D Wedge compression fracture of first lumbar vertebra, subsequent encounter for fracture with routine healing: Secondary | ICD-10-CM | POA: Diagnosis not present

## 2023-12-30 DIAGNOSIS — F411 Generalized anxiety disorder: Secondary | ICD-10-CM | POA: Diagnosis not present

## 2023-12-30 DIAGNOSIS — E039 Hypothyroidism, unspecified: Secondary | ICD-10-CM | POA: Diagnosis not present

## 2023-12-30 DIAGNOSIS — M6281 Muscle weakness (generalized): Secondary | ICD-10-CM | POA: Diagnosis not present

## 2023-12-30 DIAGNOSIS — G629 Polyneuropathy, unspecified: Secondary | ICD-10-CM | POA: Diagnosis not present

## 2023-12-30 DIAGNOSIS — F02C0 Dementia in other diseases classified elsewhere, severe, without behavioral disturbance, psychotic disturbance, mood disturbance, and anxiety: Secondary | ICD-10-CM | POA: Diagnosis not present

## 2023-12-30 DIAGNOSIS — E785 Hyperlipidemia, unspecified: Secondary | ICD-10-CM | POA: Diagnosis not present

## 2023-12-30 DIAGNOSIS — R2681 Unsteadiness on feet: Secondary | ICD-10-CM | POA: Diagnosis not present

## 2023-12-30 DIAGNOSIS — G4700 Insomnia, unspecified: Secondary | ICD-10-CM | POA: Diagnosis not present

## 2024-01-02 DIAGNOSIS — F411 Generalized anxiety disorder: Secondary | ICD-10-CM | POA: Diagnosis not present

## 2024-01-02 DIAGNOSIS — E039 Hypothyroidism, unspecified: Secondary | ICD-10-CM | POA: Diagnosis not present

## 2024-01-02 DIAGNOSIS — S32010D Wedge compression fracture of first lumbar vertebra, subsequent encounter for fracture with routine healing: Secondary | ICD-10-CM | POA: Diagnosis not present

## 2024-01-02 DIAGNOSIS — M6281 Muscle weakness (generalized): Secondary | ICD-10-CM | POA: Diagnosis not present

## 2024-01-02 DIAGNOSIS — E785 Hyperlipidemia, unspecified: Secondary | ICD-10-CM | POA: Diagnosis not present

## 2024-01-02 DIAGNOSIS — G629 Polyneuropathy, unspecified: Secondary | ICD-10-CM | POA: Diagnosis not present

## 2024-01-02 DIAGNOSIS — R2681 Unsteadiness on feet: Secondary | ICD-10-CM | POA: Diagnosis not present

## 2024-01-03 DIAGNOSIS — G629 Polyneuropathy, unspecified: Secondary | ICD-10-CM | POA: Diagnosis not present

## 2024-01-03 DIAGNOSIS — F411 Generalized anxiety disorder: Secondary | ICD-10-CM | POA: Diagnosis not present

## 2024-01-03 DIAGNOSIS — F0390 Unspecified dementia without behavioral disturbance: Secondary | ICD-10-CM | POA: Diagnosis not present

## 2024-01-03 DIAGNOSIS — G8929 Other chronic pain: Secondary | ICD-10-CM | POA: Diagnosis not present

## 2024-01-03 DIAGNOSIS — G309 Alzheimer's disease, unspecified: Secondary | ICD-10-CM | POA: Diagnosis not present

## 2024-01-03 DIAGNOSIS — E039 Hypothyroidism, unspecified: Secondary | ICD-10-CM | POA: Diagnosis not present

## 2024-01-03 DIAGNOSIS — S32010D Wedge compression fracture of first lumbar vertebra, subsequent encounter for fracture with routine healing: Secondary | ICD-10-CM | POA: Diagnosis not present

## 2024-01-03 DIAGNOSIS — R2681 Unsteadiness on feet: Secondary | ICD-10-CM | POA: Diagnosis not present

## 2024-01-03 DIAGNOSIS — E785 Hyperlipidemia, unspecified: Secondary | ICD-10-CM | POA: Diagnosis not present

## 2024-01-03 DIAGNOSIS — M6281 Muscle weakness (generalized): Secondary | ICD-10-CM | POA: Diagnosis not present

## 2024-01-04 DIAGNOSIS — G629 Polyneuropathy, unspecified: Secondary | ICD-10-CM | POA: Diagnosis not present

## 2024-01-04 DIAGNOSIS — S32010D Wedge compression fracture of first lumbar vertebra, subsequent encounter for fracture with routine healing: Secondary | ICD-10-CM | POA: Diagnosis not present

## 2024-01-04 DIAGNOSIS — F411 Generalized anxiety disorder: Secondary | ICD-10-CM | POA: Diagnosis not present

## 2024-01-04 DIAGNOSIS — R2681 Unsteadiness on feet: Secondary | ICD-10-CM | POA: Diagnosis not present

## 2024-01-04 DIAGNOSIS — M6281 Muscle weakness (generalized): Secondary | ICD-10-CM | POA: Diagnosis not present

## 2024-01-04 DIAGNOSIS — E039 Hypothyroidism, unspecified: Secondary | ICD-10-CM | POA: Diagnosis not present

## 2024-01-04 DIAGNOSIS — E785 Hyperlipidemia, unspecified: Secondary | ICD-10-CM | POA: Diagnosis not present

## 2024-01-05 DIAGNOSIS — G309 Alzheimer's disease, unspecified: Secondary | ICD-10-CM | POA: Diagnosis not present

## 2024-01-05 DIAGNOSIS — F0390 Unspecified dementia without behavioral disturbance: Secondary | ICD-10-CM | POA: Diagnosis not present

## 2024-01-09 DIAGNOSIS — H353133 Nonexudative age-related macular degeneration, bilateral, advanced atrophic without subfoveal involvement: Secondary | ICD-10-CM | POA: Diagnosis not present

## 2024-01-09 DIAGNOSIS — F4323 Adjustment disorder with mixed anxiety and depressed mood: Secondary | ICD-10-CM | POA: Diagnosis not present

## 2024-01-09 DIAGNOSIS — H524 Presbyopia: Secondary | ICD-10-CM | POA: Diagnosis not present

## 2024-01-09 DIAGNOSIS — Z961 Presence of intraocular lens: Secondary | ICD-10-CM | POA: Diagnosis not present

## 2024-01-09 DIAGNOSIS — H52203 Unspecified astigmatism, bilateral: Secondary | ICD-10-CM | POA: Diagnosis not present

## 2024-01-10 DIAGNOSIS — S32010D Wedge compression fracture of first lumbar vertebra, subsequent encounter for fracture with routine healing: Secondary | ICD-10-CM | POA: Diagnosis not present

## 2024-01-10 DIAGNOSIS — G629 Polyneuropathy, unspecified: Secondary | ICD-10-CM | POA: Diagnosis not present

## 2024-01-10 DIAGNOSIS — I10 Essential (primary) hypertension: Secondary | ICD-10-CM | POA: Diagnosis not present

## 2024-01-10 DIAGNOSIS — M6281 Muscle weakness (generalized): Secondary | ICD-10-CM | POA: Diagnosis not present

## 2024-01-10 DIAGNOSIS — E785 Hyperlipidemia, unspecified: Secondary | ICD-10-CM | POA: Diagnosis not present

## 2024-01-10 DIAGNOSIS — I130 Hypertensive heart and chronic kidney disease with heart failure and stage 1 through stage 4 chronic kidney disease, or unspecified chronic kidney disease: Secondary | ICD-10-CM | POA: Diagnosis not present

## 2024-01-10 DIAGNOSIS — F0390 Unspecified dementia without behavioral disturbance: Secondary | ICD-10-CM | POA: Diagnosis not present

## 2024-01-10 DIAGNOSIS — F411 Generalized anxiety disorder: Secondary | ICD-10-CM | POA: Diagnosis not present

## 2024-01-10 DIAGNOSIS — R2681 Unsteadiness on feet: Secondary | ICD-10-CM | POA: Diagnosis not present

## 2024-01-10 DIAGNOSIS — E039 Hypothyroidism, unspecified: Secondary | ICD-10-CM | POA: Diagnosis not present

## 2024-01-10 DIAGNOSIS — Z9181 History of falling: Secondary | ICD-10-CM | POA: Diagnosis not present

## 2024-01-12 DIAGNOSIS — M6281 Muscle weakness (generalized): Secondary | ICD-10-CM | POA: Diagnosis not present

## 2024-01-12 DIAGNOSIS — F411 Generalized anxiety disorder: Secondary | ICD-10-CM | POA: Diagnosis not present

## 2024-01-12 DIAGNOSIS — G629 Polyneuropathy, unspecified: Secondary | ICD-10-CM | POA: Diagnosis not present

## 2024-01-12 DIAGNOSIS — G309 Alzheimer's disease, unspecified: Secondary | ICD-10-CM | POA: Diagnosis not present

## 2024-01-12 DIAGNOSIS — S32010D Wedge compression fracture of first lumbar vertebra, subsequent encounter for fracture with routine healing: Secondary | ICD-10-CM | POA: Diagnosis not present

## 2024-01-12 DIAGNOSIS — R600 Localized edema: Secondary | ICD-10-CM | POA: Diagnosis not present

## 2024-01-12 DIAGNOSIS — E039 Hypothyroidism, unspecified: Secondary | ICD-10-CM | POA: Diagnosis not present

## 2024-01-12 DIAGNOSIS — R2681 Unsteadiness on feet: Secondary | ICD-10-CM | POA: Diagnosis not present

## 2024-01-12 DIAGNOSIS — E785 Hyperlipidemia, unspecified: Secondary | ICD-10-CM | POA: Diagnosis not present

## 2024-01-13 DIAGNOSIS — R278 Other lack of coordination: Secondary | ICD-10-CM | POA: Diagnosis not present

## 2024-01-13 DIAGNOSIS — R2681 Unsteadiness on feet: Secondary | ICD-10-CM | POA: Diagnosis not present

## 2024-01-17 ENCOUNTER — Emergency Department (HOSPITAL_COMMUNITY)

## 2024-01-17 ENCOUNTER — Encounter (HOSPITAL_COMMUNITY): Payer: Self-pay | Admitting: Emergency Medicine

## 2024-01-17 ENCOUNTER — Observation Stay (HOSPITAL_COMMUNITY)
Admission: EM | Admit: 2024-01-17 | Discharge: 2024-01-20 | Disposition: A | Discharge status: Other Acute Inpatient Hospital

## 2024-01-17 ENCOUNTER — Other Ambulatory Visit: Payer: Self-pay

## 2024-01-17 DIAGNOSIS — S32010A Wedge compression fracture of first lumbar vertebra, initial encounter for closed fracture: Secondary | ICD-10-CM | POA: Diagnosis not present

## 2024-01-17 DIAGNOSIS — E039 Hypothyroidism, unspecified: Secondary | ICD-10-CM | POA: Diagnosis not present

## 2024-01-17 DIAGNOSIS — I2510 Atherosclerotic heart disease of native coronary artery without angina pectoris: Secondary | ICD-10-CM | POA: Insufficient documentation

## 2024-01-17 DIAGNOSIS — S6292XA Unspecified fracture of left wrist and hand, initial encounter for closed fracture: Secondary | ICD-10-CM | POA: Diagnosis present

## 2024-01-17 DIAGNOSIS — N1831 Chronic kidney disease, stage 3a: Secondary | ICD-10-CM | POA: Insufficient documentation

## 2024-01-17 DIAGNOSIS — S0990XA Unspecified injury of head, initial encounter: Secondary | ICD-10-CM | POA: Diagnosis not present

## 2024-01-17 DIAGNOSIS — S32000A Wedge compression fracture of unspecified lumbar vertebra, initial encounter for closed fracture: Secondary | ICD-10-CM | POA: Diagnosis present

## 2024-01-17 DIAGNOSIS — R296 Repeated falls: Secondary | ICD-10-CM | POA: Diagnosis not present

## 2024-01-17 DIAGNOSIS — N3281 Overactive bladder: Secondary | ICD-10-CM | POA: Diagnosis not present

## 2024-01-17 DIAGNOSIS — F0390 Unspecified dementia without behavioral disturbance: Secondary | ICD-10-CM

## 2024-01-17 DIAGNOSIS — Z79899 Other long term (current) drug therapy: Secondary | ICD-10-CM | POA: Diagnosis not present

## 2024-01-17 DIAGNOSIS — S32019A Unspecified fracture of first lumbar vertebra, initial encounter for closed fracture: Secondary | ICD-10-CM | POA: Diagnosis not present

## 2024-01-17 DIAGNOSIS — M419 Scoliosis, unspecified: Secondary | ICD-10-CM | POA: Insufficient documentation

## 2024-01-17 DIAGNOSIS — S3993XA Unspecified injury of pelvis, initial encounter: Secondary | ICD-10-CM | POA: Diagnosis not present

## 2024-01-17 DIAGNOSIS — B372 Candidiasis of skin and nail: Secondary | ICD-10-CM | POA: Diagnosis not present

## 2024-01-17 DIAGNOSIS — W19XXXA Unspecified fall, initial encounter: Principal | ICD-10-CM | POA: Insufficient documentation

## 2024-01-17 DIAGNOSIS — S199XXA Unspecified injury of neck, initial encounter: Secondary | ICD-10-CM | POA: Diagnosis not present

## 2024-01-17 DIAGNOSIS — S52502A Unspecified fracture of the lower end of left radius, initial encounter for closed fracture: Secondary | ICD-10-CM | POA: Insufficient documentation

## 2024-01-17 DIAGNOSIS — I350 Nonrheumatic aortic (valve) stenosis: Principal | ICD-10-CM | POA: Insufficient documentation

## 2024-01-17 DIAGNOSIS — S52325A Nondisplaced transverse fracture of shaft of left radius, initial encounter for closed fracture: Secondary | ICD-10-CM | POA: Diagnosis not present

## 2024-01-17 DIAGNOSIS — S0083XA Contusion of other part of head, initial encounter: Secondary | ICD-10-CM | POA: Insufficient documentation

## 2024-01-17 DIAGNOSIS — M25532 Pain in left wrist: Secondary | ICD-10-CM | POA: Diagnosis present

## 2024-01-17 DIAGNOSIS — Y92002 Bathroom of unspecified non-institutional (private) residence single-family (private) house as the place of occurrence of the external cause: Secondary | ICD-10-CM | POA: Diagnosis not present

## 2024-01-17 DIAGNOSIS — I10 Essential (primary) hypertension: Secondary | ICD-10-CM | POA: Diagnosis present

## 2024-01-17 DIAGNOSIS — M79672 Pain in left foot: Secondary | ICD-10-CM | POA: Diagnosis not present

## 2024-01-17 DIAGNOSIS — E785 Hyperlipidemia, unspecified: Secondary | ICD-10-CM | POA: Diagnosis not present

## 2024-01-17 DIAGNOSIS — G629 Polyneuropathy, unspecified: Secondary | ICD-10-CM | POA: Diagnosis not present

## 2024-01-17 DIAGNOSIS — G9389 Other specified disorders of brain: Secondary | ICD-10-CM | POA: Diagnosis not present

## 2024-01-17 DIAGNOSIS — M25572 Pain in left ankle and joints of left foot: Secondary | ICD-10-CM | POA: Diagnosis present

## 2024-01-17 DIAGNOSIS — I6523 Occlusion and stenosis of bilateral carotid arteries: Secondary | ICD-10-CM | POA: Diagnosis not present

## 2024-01-17 DIAGNOSIS — M79671 Pain in right foot: Secondary | ICD-10-CM | POA: Diagnosis not present

## 2024-01-17 DIAGNOSIS — M25531 Pain in right wrist: Secondary | ICD-10-CM | POA: Diagnosis not present

## 2024-01-17 DIAGNOSIS — S299XXA Unspecified injury of thorax, initial encounter: Secondary | ICD-10-CM | POA: Diagnosis not present

## 2024-01-17 DIAGNOSIS — S3991XA Unspecified injury of abdomen, initial encounter: Secondary | ICD-10-CM | POA: Diagnosis not present

## 2024-01-17 DIAGNOSIS — G309 Alzheimer's disease, unspecified: Secondary | ICD-10-CM | POA: Diagnosis not present

## 2024-01-17 DIAGNOSIS — M50322 Other cervical disc degeneration at C5-C6 level: Secondary | ICD-10-CM | POA: Diagnosis not present

## 2024-01-17 DIAGNOSIS — I129 Hypertensive chronic kidney disease with stage 1 through stage 4 chronic kidney disease, or unspecified chronic kidney disease: Secondary | ICD-10-CM | POA: Diagnosis not present

## 2024-01-17 DIAGNOSIS — Z91199 Patient's noncompliance with other medical treatment and regimen due to unspecified reason: Secondary | ICD-10-CM | POA: Insufficient documentation

## 2024-01-17 DIAGNOSIS — F0280 Dementia in other diseases classified elsewhere without behavioral disturbance: Secondary | ICD-10-CM | POA: Diagnosis present

## 2024-01-17 DIAGNOSIS — F411 Generalized anxiety disorder: Secondary | ICD-10-CM | POA: Diagnosis present

## 2024-01-17 LAB — CBC WITH DIFFERENTIAL/PLATELET
Abs Immature Granulocytes: 0.09 K/uL — ABNORMAL HIGH (ref 0.00–0.07)
Basophils Absolute: 0.1 K/uL (ref 0.0–0.1)
Basophils Relative: 1 %
Eosinophils Absolute: 0.2 K/uL (ref 0.0–0.5)
Eosinophils Relative: 3 %
HCT: 37.4 % (ref 36.0–46.0)
Hemoglobin: 11.6 g/dL — ABNORMAL LOW (ref 12.0–15.0)
Immature Granulocytes: 1 %
Lymphocytes Relative: 18 %
Lymphs Abs: 1.4 K/uL (ref 0.7–4.0)
MCH: 30 pg (ref 26.0–34.0)
MCHC: 31 g/dL (ref 30.0–36.0)
MCV: 96.6 fL (ref 80.0–100.0)
Monocytes Absolute: 0.9 K/uL (ref 0.1–1.0)
Monocytes Relative: 12 %
Neutro Abs: 4.8 K/uL (ref 1.7–7.7)
Neutrophils Relative %: 65 %
Platelets: 157 K/uL (ref 150–400)
RBC: 3.87 MIL/uL (ref 3.87–5.11)
RDW: 15.6 % — ABNORMAL HIGH (ref 11.5–15.5)
WBC: 7.4 K/uL (ref 4.0–10.5)
nRBC: 0 % (ref 0.0–0.2)

## 2024-01-17 LAB — COMPREHENSIVE METABOLIC PANEL WITH GFR
ALT: 10 U/L (ref 0–44)
AST: 26 U/L (ref 15–41)
Albumin: 3 g/dL — ABNORMAL LOW (ref 3.5–5.0)
Alkaline Phosphatase: 57 U/L (ref 38–126)
Anion gap: 7 (ref 5–15)
BUN: 17 mg/dL (ref 8–23)
CO2: 25 mmol/L (ref 22–32)
Calcium: 8.9 mg/dL (ref 8.9–10.3)
Chloride: 105 mmol/L (ref 98–111)
Creatinine, Ser: 0.83 mg/dL (ref 0.44–1.00)
GFR, Estimated: >60 mL/min (ref >60)
Glucose, Bld: 93 mg/dL (ref 70–99)
Potassium: 4.3 mmol/L (ref 3.5–5.1)
Sodium: 137 mmol/L (ref 135–145)
Total Bilirubin: 0.6 mg/dL (ref 0.0–1.2)
Total Protein: 6 g/dL — ABNORMAL LOW (ref 6.5–8.1)

## 2024-01-17 MED ORDER — DICLOFENAC SODIUM 1 % EX GEL
2.0000 g | Freq: Four times a day (QID) | CUTANEOUS | Status: DC
  Administered 2024-01-17 – 2024-01-20 (×12): 2 g via TOPICAL
  Filled 2024-01-17: qty 100

## 2024-01-17 MED ORDER — IOHEXOL 350 MG/ML SOLN
75.0000 mL | Freq: Once | INTRAVENOUS | Status: AC | PRN
  Administered 2024-01-17: 75 mL via INTRAVENOUS

## 2024-01-17 MED ORDER — LEVOTHYROXINE SODIUM 50 MCG PO TABS
50.0000 ug | ORAL_TABLET | Freq: Every day | ORAL | Status: DC
  Administered 2024-01-18 – 2024-01-20 (×3): 50 ug via ORAL
  Filled 2024-01-17 (×3): qty 1

## 2024-01-17 MED ORDER — OXYCODONE-ACETAMINOPHEN 5-325 MG PO TABS
1.0000 | ORAL_TABLET | Freq: Once | ORAL | Status: AC
  Administered 2024-01-17: 1 via ORAL
  Filled 2024-01-17: qty 1

## 2024-01-17 MED ORDER — ACETAMINOPHEN 325 MG PO TABS
650.0000 mg | ORAL_TABLET | ORAL | Status: DC | PRN
  Administered 2024-01-17 – 2024-01-18 (×2): 650 mg via ORAL
  Filled 2024-01-17 (×2): qty 2

## 2024-01-17 MED ORDER — NYSTATIN 100000 UNIT/GM EX POWD
Freq: Two times a day (BID) | CUTANEOUS | Status: DC
  Filled 2024-01-17: qty 15

## 2024-01-17 MED ORDER — PANTOPRAZOLE SODIUM 40 MG PO TBEC
40.0000 mg | DELAYED_RELEASE_TABLET | Freq: Every day | ORAL | Status: DC
  Administered 2024-01-17 – 2024-01-20 (×4): 40 mg via ORAL
  Filled 2024-01-17 (×4): qty 1

## 2024-01-17 MED ORDER — ENOXAPARIN SODIUM 40 MG/0.4ML IJ SOSY: 40.0000 mg | PREFILLED_SYRINGE | INTRAMUSCULAR | Status: DC

## 2024-01-17 MED ORDER — LOSARTAN POTASSIUM 50 MG PO TABS
50.0000 mg | ORAL_TABLET | Freq: Every day | ORAL | Status: DC
  Administered 2024-01-17 – 2024-01-20 (×4): 50 mg via ORAL
  Filled 2024-01-17 (×4): qty 1

## 2024-01-17 NOTE — ED Notes (Signed)
 Phlebotomy at bedside.

## 2024-01-17 NOTE — ED Provider Notes (Signed)
 [place] EMERGENCY DEPARTMENT AT [facility] Provider Note   CSN: [id number] Arrival date & time: 01/17/24  0543     Patient presents with: Deanna Olson is a 89 y.o. female.   The history is provided by the patient.  Fall   She has history of hypertension, hyperlipidemia and comes in following a fall at home.  She states that she got out of bed and that her legs gave out on her.  She is complaining of pain pretty much everywhere.  She denies loss of consciousness.    Prior to Admission medications   Medication Sig Start Date End Date Taking? Authorizing Provider  acetaminophen  (TYLENOL ) 325 MG tablet Take 650 mg by mouth in the morning and at bedtime.    [provider]  albuterol  (VENTOLIN  HFA) 108 (90 Base) MCG/ACT inhaler Inhale 2 puffs into the lungs every 6 (six) hours as needed for wheezing or shortness of breath.    [provider]  ALPRAZolam  (XANAX ) 0.5 MG tablet Take 1 tablet (0.5 mg total) by mouth 2 (two) times daily. 12/20/23   Vernon Ranks, MD  betamethasone  dipropionate 0.05 % lotion APPLY TOPICALLY DAILY AS NEEDED USES ON SCALP AND EARS Patient taking differently: Apply 1 application  topically daily as needed (for irritation- behind the ears and scalp). 12/22/22   Rollene Almarie LABOR, MD  Cholecalciferol (VITAMIN D -3 PO) Take 400 Units by mouth daily after breakfast.    [provider]  diclofenac  Sodium (VOLTAREN ) 1 % GEL Apply 4 g topically in the morning, at noon, and at bedtime.    [provider]  diphenhydramine-acetaminophen  (TYLENOL  PM) 25-500 MG TABS tablet Take 1 tablet by mouth at bedtime as needed.    [provider]  gabapentin  (NEURONTIN ) 100 MG capsule TAKE 1 CAPSULE BY MOUTH EVERY MORNING 06/27/23   Rollene Almarie LABOR, MD  gabapentin  (NEURONTIN ) 300 MG capsule TAKE 1 CAPSULE BY MOUTH EVERY NIGHT AT BEDTIME 06/06/23   Rollene Almarie LABOR, MD  guaiFENesin -dextromethorphan (ROBITUSSIN DM)  100-10 MG/5ML syrup Take 5 mLs by mouth every 4 (four) hours as needed for cough. 10/30/21   Norleen Lynwood ORN, MD  ketoconazole  (NIZORAL ) 2 % cream APPLY ONE APPLICATION TOPICALLY DAILY 08/26/23   Rollene Almarie LABOR, MD  levothyroxine  (SYNTHROID ) 50 MCG tablet TAKE 1 TABLET BY MOUTH DAILY BEFORE BREAKFAST 04/11/23   Rollene Almarie LABOR, MD  losartan  (COZAAR ) 50 MG tablet TAKE 1 TABLET BY MOUTH DAILY 08/08/23   Rollene Almarie LABOR, MD  melatonin 3 MG TABS tablet Take 6 mg by mouth at bedtime.    [provider]  mirabegron  ER (MYRBETRIQ ) 50 MG TB24 tablet TAKE 1 TABLET BY MOUTH DAILY **NEED APPOINTMENT FOR REFILLS** 08/02/23   Rollene Almarie LABOR, MD  Multiple Vitamins-Minerals (PRESERVISION AREDS 2) CAPS Take 1 capsule by mouth in the morning and at bedtime.    [provider]  omeprazole  (PRILOSEC) 20 MG capsule TAKE 1 CAPSULE BY MOUTH DAILY 08/22/23   Rollene Almarie LABOR, MD  oxyCODONE  (OXY IR/ROXICODONE ) 5 MG immediate release tablet Take 1 tablet (5 mg total) by mouth every 4 (four) hours as needed for moderate pain (pain score 4-6). 12/20/23   Vernon Ranks, MD  pravastatin  (PRAVACHOL ) 20 MG tablet TAKE 1 TABLET BY MOUTH DAILY 03/02/23   Rollene Almarie LABOR, MD  SYSTANE ULTRA 0.4-0.3 % SOLN Place 1 drop into both eyes in the morning and at bedtime.    [provider]  XYLIMELTS 550 MG  DISK Take 1 tablet by mouth at bedtime.    [provider]    Allergies: Sulfonamide derivatives    Review of Systems  All other systems reviewed and are negative.   Updated Vital Signs BP (!) 156/72 (BP Location: Left Arm)   Pulse 60   Temp 98 F (36.7 C) (Temporal)   Resp 20   SpO2 98%   Physical Exam Vitals and nursing note reviewed.   89 year old female, resting comfortably and in no acute distress. Vital signs are significant for elevated blood pressure. Oxygen saturation is 98%, which is normal. Head is normocephalic and atraumatic. PERRLA, EOMI.  Neck is  mildly tender diffusely. Back is tender diffusely. Lungs are clear without rales, wheezes, or rhonchi. Chest is tender diffusely, there is no crepitus. Heart has regular rate and rhythm without murmur. Abdomen is soft, flat, with moderate tenderness diffusely. Pelvis is stable and nontender. Extremities have 1+ edema.  There is tenderness to palpation over both wrists and both ankles but no significant pain with passive range of motion.  There is full passive range of motion of both hips and knees and elbows and shoulders without significant pain.  There is tenderness palpation over both shoulders. Skin is warm and dry without rash. Neurologic: Awake and alert and oriented, cranial nerves are intact, moves all extremities equally.  (all labs ordered are listed, but only abnormal results are displayed) Labs Reviewed  COMPREHENSIVE METABOLIC PANEL WITH GFR  CBC WITH DIFFERENTIAL/PLATELET  I-STAT CREATININE, ED    Radiology: DG Wrist Complete Right Result Date: 01/17/2024 CLINICAL DATA:  Clemens in the bathroom at home with bilateral wrist pain and bilateral ankle pain. EXAM: RIGHT WRIST - COMPLETE 3+ VIEW; LEFT WRIST - COMPLETE 3+ VIEW; RIGHT ANKLE - COMPLETE 3+ VIEW; LEFT ANKLE COMPLETE - 3+ VIEW COMPARISON:  None. FINDINGS: Three views of the right wrist: There is osteopenia without evidence of displaced fractures. There is joint space loss and spurring of the triscaphe and first CMC joints. Joint narrowing and spurring also involving the thumb interphalangeal joint. Other joints are maintained. There are dystrophic subcutaneous calcifications in the dorsoulnar distal forearm. Mild dorsal swelling at the wrist. Three views of left wrist: Osteopenia. There is a transverse nondisplaced fracture of the left distal radial metaphysis, with the slight cortical buckling at the lateral fracture margin. No further evidence of fractures is seen. There is narrowing and osteophytes of the first CMC joint. Other  joint spaces are maintained. There are dystrophic calcifications in the distal forearm and mild to moderate circumferential swelling at the left wrist. Three views right ankle: There is osteopenia without evidence of fractures. Joint spaces are maintained. There is moderate dorsal and plantar calcaneal enthesopathic spurring. There is moderate to severe soft tissue edema greatest laterally, with extension over the hindfoot and midfoot. There are dystrophic subcutaneous calcifications in the lateral distal foreleg. Three views left ankle: There is osteopenia without evidence of fractures. Joint spaces are maintained. There is moderate dorsal and plantar calcaneal enthesopathic spurring. There is severe circumferential edema. IMPRESSION: 1. Osteopenia. 2. Nondisplaced transverse fracture of the left distal radial metaphysis. 3. No further evidence of fractures in the bilateral wrists and ankles. 4. Bilateral wrist and ankle soft tissue swelling. 5. Bilateral calcaneal enthesopathic spurring. 6. Dystrophic subcutaneous calcifications in the distal forearms and lateral distal right foreleg. Electronically Signed   By: Francis Quam M.D.   On: 01/17/2024 06:57   DG Wrist Complete Left Result Date: 01/17/2024 CLINICAL DATA:  Fell in the bathroom at home with bilateral wrist pain and bilateral ankle pain. EXAM: RIGHT WRIST - COMPLETE 3+ VIEW; LEFT WRIST - COMPLETE 3+ VIEW; RIGHT ANKLE - COMPLETE 3+ VIEW; LEFT ANKLE COMPLETE - 3+ VIEW COMPARISON:  None. FINDINGS: Three views of the right wrist: There is osteopenia without evidence of displaced fractures. There is joint space loss and spurring of the triscaphe and first CMC joints. Joint narrowing and spurring also involving the thumb interphalangeal joint. Other joints are maintained. There are dystrophic subcutaneous calcifications in the dorsoulnar distal forearm. Mild dorsal swelling at the wrist. Three views of left wrist: Osteopenia. There is a transverse  nondisplaced fracture of the left distal radial metaphysis, with the slight cortical buckling at the lateral fracture margin. No further evidence of fractures is seen. There is narrowing and osteophytes of the first CMC joint. Other joint spaces are maintained. There are dystrophic calcifications in the distal forearm and mild to moderate circumferential swelling at the left wrist. Three views right ankle: There is osteopenia without evidence of fractures. Joint spaces are maintained. There is moderate dorsal and plantar calcaneal enthesopathic spurring. There is moderate to severe soft tissue edema greatest laterally, with extension over the hindfoot and midfoot. There are dystrophic subcutaneous calcifications in the lateral distal foreleg. Three views left ankle: There is osteopenia without evidence of fractures. Joint spaces are maintained. There is moderate dorsal and plantar calcaneal enthesopathic spurring. There is severe circumferential edema. IMPRESSION: 1. Osteopenia. 2. Nondisplaced transverse fracture of the left distal radial metaphysis. 3. No further evidence of fractures in the bilateral wrists and ankles. 4. Bilateral wrist and ankle soft tissue swelling. 5. Bilateral calcaneal enthesopathic spurring. 6. Dystrophic subcutaneous calcifications in the distal forearms and lateral distal right foreleg. Electronically Signed   By: Francis Quam M.D.   On: 01/17/2024 06:57   DG Ankle Complete Right Result Date: 01/17/2024 CLINICAL DATA:  Clemens in the bathroom at home with bilateral wrist pain and bilateral ankle pain. EXAM: RIGHT WRIST - COMPLETE 3+ VIEW; LEFT WRIST - COMPLETE 3+ VIEW; RIGHT ANKLE - COMPLETE 3+ VIEW; LEFT ANKLE COMPLETE - 3+ VIEW COMPARISON:  None. FINDINGS: Three views of the right wrist: There is osteopenia without evidence of displaced fractures. There is joint space loss and spurring of the triscaphe and first CMC joints. Joint narrowing and spurring also involving the thumb  interphalangeal joint. Other joints are maintained. There are dystrophic subcutaneous calcifications in the dorsoulnar distal forearm. Mild dorsal swelling at the wrist. Three views of left wrist: Osteopenia. There is a transverse nondisplaced fracture of the left distal radial metaphysis, with the slight cortical buckling at the lateral fracture margin. No further evidence of fractures is seen. There is narrowing and osteophytes of the first CMC joint. Other joint spaces are maintained. There are dystrophic calcifications in the distal forearm and mild to moderate circumferential swelling at the left wrist. Three views right ankle: There is osteopenia without evidence of fractures. Joint spaces are maintained. There is moderate dorsal and plantar calcaneal enthesopathic spurring. There is moderate to severe soft tissue edema greatest laterally, with extension over the hindfoot and midfoot. There are dystrophic subcutaneous calcifications in the lateral distal foreleg. Three views left ankle: There is osteopenia without evidence of fractures. Joint spaces are maintained. There is moderate dorsal and plantar calcaneal enthesopathic spurring. There is severe circumferential edema. IMPRESSION: 1. Osteopenia. 2. Nondisplaced transverse fracture of the left distal radial metaphysis. 3. No further evidence of fractures in the bilateral wrists  and ankles. 4. Bilateral wrist and ankle soft tissue swelling. 5. Bilateral calcaneal enthesopathic spurring. 6. Dystrophic subcutaneous calcifications in the distal forearms and lateral distal right foreleg. Electronically Signed   By: Francis Quam M.D.   On: 01/17/2024 06:57   DG Ankle Complete Left Result Date: 01/17/2024 CLINICAL DATA:  Clemens in the bathroom at home with bilateral wrist pain and bilateral ankle pain. EXAM: RIGHT WRIST - COMPLETE 3+ VIEW; LEFT WRIST - COMPLETE 3+ VIEW; RIGHT ANKLE - COMPLETE 3+ VIEW; LEFT ANKLE COMPLETE - 3+ VIEW COMPARISON:  None. FINDINGS:  Three views of the right wrist: There is osteopenia without evidence of displaced fractures. There is joint space loss and spurring of the triscaphe and first CMC joints. Joint narrowing and spurring also involving the thumb interphalangeal joint. Other joints are maintained. There are dystrophic subcutaneous calcifications in the dorsoulnar distal forearm. Mild dorsal swelling at the wrist. Three views of left wrist: Osteopenia. There is a transverse nondisplaced fracture of the left distal radial metaphysis, with the slight cortical buckling at the lateral fracture margin. No further evidence of fractures is seen. There is narrowing and osteophytes of the first CMC joint. Other joint spaces are maintained. There are dystrophic calcifications in the distal forearm and mild to moderate circumferential swelling at the left wrist. Three views right ankle: There is osteopenia without evidence of fractures. Joint spaces are maintained. There is moderate dorsal and plantar calcaneal enthesopathic spurring. There is moderate to severe soft tissue edema greatest laterally, with extension over the hindfoot and midfoot. There are dystrophic subcutaneous calcifications in the lateral distal foreleg. Three views left ankle: There is osteopenia without evidence of fractures. Joint spaces are maintained. There is moderate dorsal and plantar calcaneal enthesopathic spurring. There is severe circumferential edema. IMPRESSION: 1. Osteopenia. 2. Nondisplaced transverse fracture of the left distal radial metaphysis. 3. No further evidence of fractures in the bilateral wrists and ankles. 4. Bilateral wrist and ankle soft tissue swelling. 5. Bilateral calcaneal enthesopathic spurring. 6. Dystrophic subcutaneous calcifications in the distal forearms and lateral distal right foreleg. Electronically Signed   By: Francis Quam M.D.   On: 01/17/2024 06:57     .Splint Application  Date/Time: 01/17/2024 7:32 AM  Performed by: Raford Lenis, MD Authorized by: Raford Lenis, MD   Consent:    Consent obtained:  Verbal   Consent given by:  Patient   Risks, benefits, and alternatives were discussed: yes     Risks discussed:  Pain, numbness and swelling   Alternatives discussed:  No treatment Universal protocol:    Procedure explained and questions answered to patient or proxy's satisfaction: yes     Relevant documents present and verified: yes     Test results available: yes     Imaging studies available: yes     Required blood products, implants, devices, and special equipment available: yes     Site/side marked: yes     Immediately prior to procedure a time out was called: yes     Patient identity confirmed:  Verbally with patient and arm band Pre-procedure details:    Distal neurologic exam:  Normal   Distal perfusion: brisk capillary refill   Procedure details:    Location:  Wrist   Wrist location:  L wrist   Strapping: no     Splint type:  Sugar tong   Supplies:  Elastic bandage and fiberglass Post-procedure details:    Distal neurologic exam:  Unchanged   Distal perfusion: unchanged  Procedure completion:  Tolerated well, no immediate complications    Medications Ordered in the ED - No data to display                                  Medical Decision Making Amount and/or Complexity of Data Reviewed Labs: ordered. Radiology: ordered.   Fall with complaints of pain in multiple locations but no objective findings suggesting serious injury.  I have ordered CT scans of head and cervical spine and chest/abdomen/pelvis which will adequately evaluate her back pain.  Shoulders and hips should be adequately seen on the CT scan.  I have ordered x-rays of both wrists and both elbows.  I have ordered screening labs.  I have reviewed her past records, and notes she had a hospitalization following a fall at home on 12/25/2021.  Extremity x-rays show nondisplaced fracture of the distal left radius and no other  fractures seen.  I have independently viewed all of the images, and agree with the radiologist's interpretation.  I have ordered application of a sugar-tong splint.  CT scans are pending as are laboratory results.  Case is signed out to Dr. Ula.     Final diagnoses:  Fall at home, initial encounter  Closed fracture of distal end of left radius, initial encounter  Forehead contusion, initial encounter    ED Discharge Orders     None          Raford Lenis, MD 01/17/24 [id number]

## 2024-01-17 NOTE — ED Notes (Signed)
 Ortho tech called

## 2024-01-17 NOTE — ED Notes (Signed)
 Phlebotomy asked to collect labs as night shift was unable to obtain after multiple attempts.

## 2024-01-17 NOTE — Progress Notes (Signed)
 Patient took off splint, called ortho tech to reapply left arm splint.

## 2024-01-17 NOTE — ED Notes (Signed)
 Ortho tech at bedside, rings from left hand removed and placed in cup and handed to daughters at bedside

## 2024-01-17 NOTE — H&P (Signed)
 History and Physical    Patient: Deanna Olson FMW:[id number] DOB: 17-Oct-1925 DOA: 01/17/2024 DOS: the patient was seen and examined on 01/17/2024 PCP: Patient, No Pcp Per  Patient coming from: ALF/ILF  Chief Complaint:  Chief Complaint  Patient presents with   Fall   HPI: Deanna Olson is a 89 y.o. female with medical history significant of ?dementia, moderate aortic stenosis, LBBB, HTN, hypothyroidism, multiple GLFs, and recent admission in 12/2023 for L1 compression fracture p/w recurrent GLF and new L wrist fracture.  Pt is a poor historian and unable to provide medical history. HPI obtained from discussion with EDP and Epic review. From what I can gather, pt fell at home yesterday (unclear cause) and suffered LUE fracture.  In the ED, pt hypertensive. Labs unremarkable except albumin 3. Multiple trauma scans revealed known L1 compression fracture and new nondisplaced transverse fracture of the left distal radial metaphysis. Ortho consulted and LUE splint applied in ED. EDP requested medicine admission iso recurrent GLFs.  Review of Systems: As mentioned in the history of present illness. All other systems reviewed and are negative. Past Medical History:  Diagnosis Date   AICD (automatic cardioverter/defibrillator) present    Allergic rhinitis, cause unspecified    Anxiety state, unspecified    Aortic valve disorders    Mild AS 2013 echo    Eczema    Esophageal reflux    Herpes zoster without mention of complication    Hyperlipidemia    Hypertension    Hypothyroid    Osteoarthrosis, unspecified whether generalized or localized, unspecified site    Unspecified venous (peripheral) insufficiency    Past Surgical History:  Procedure Laterality Date   CATARACT EXTRACTION, BILATERAL  2013   KYPHOPLASTY  02/2018   VESICOVAGINAL FISTULA CLOSURE W/ TAH     Social History:  reports that she has never smoked. She has never used smokeless tobacco. She reports that she does not drink  alcohol and does not use drugs.  Allergies  Allergen Reactions   Sulfonamide Derivatives Swelling and Other (See Comments)    Angioedema     Family History  Problem Relation Age of Onset   Stroke Mother 56   Diabetes Mother    Diabetes Brother    Heart attack Brother 2       s/p open heart surg   CAD Brother 60   Cancer Sister        4 types    Prior to Admission medications   Medication Sig Start Date End Date Taking? Authorizing Provider  acetaminophen  (TYLENOL ) 325 MG tablet Take 650 mg by mouth in the morning and at bedtime.   Yes [provider]  albuterol  (VENTOLIN  HFA) 108 (90 Base) MCG/ACT inhaler Inhale 2 puffs into the lungs every 6 (six) hours as needed for wheezing or shortness of breath.   Yes [provider]  ALPRAZolam  (XANAX ) 0.5 MG tablet Take 1 tablet (0.5 mg total) by mouth 2 (two) times daily. 12/20/23  Yes Pahwani, Fredia, MD  betamethasone  dipropionate 0.05 % lotion APPLY TOPICALLY DAILY AS NEEDED USES ON SCALP AND EARS Patient taking differently: Apply 1 application  topically daily as needed (rash). 12/22/22  Yes Rollene Almarie LABOR, MD  Cholecalciferol (VITAMIN D3) 10 MCG (400 UNIT) tablet Take 400 Units by mouth daily.   Yes [provider]  diclofenac  Sodium (VOLTAREN ) 1 % GEL Apply 2-4 g topically See admin instructions. Apply 2 grams to shoulders and back 3 times daily and apply 4 grams to legs 3  times daily.   Yes [provider]  diphenhydramine-acetaminophen  (PAIN RELIEF PM EXTRA STRENGTH) 25-500 MG TABS tablet Take 1 tablet by mouth at bedtime as needed (sleep).   Yes [provider]  furosemide  (LASIX ) 20 MG tablet Take 20 mg by mouth daily. 01/13/24 01/17/24 Yes [provider]  gabapentin  (NEURONTIN ) 100 MG capsule TAKE 1 CAPSULE BY MOUTH EVERY MORNING 06/27/23  Yes Rollene Almarie LABOR, MD  gabapentin  (NEURONTIN ) 300 MG capsule TAKE 1 CAPSULE BY MOUTH EVERY NIGHT AT BEDTIME 06/06/23  Yes Rollene Almarie LABOR, MD  guaiFENesin -dextromethorphan (ROBITUSSIN DM) 100-10 MG/5ML syrup Take 5 mLs by mouth every 4 (four) hours as needed for cough. 10/30/21  Yes Norleen Lynwood ORN, MD  ketoconazole  (NIZORAL ) 2 % cream APPLY ONE APPLICATION TOPICALLY DAILY Patient taking differently: Apply 1 Application topically See admin instructions. Apply topically to rash 3 times daily, with every shift. 08/26/23  Yes Rollene Almarie LABOR, MD  levothyroxine  (SYNTHROID ) 50 MCG tablet TAKE 1 TABLET BY MOUTH DAILY BEFORE BREAKFAST 04/11/23  Yes Rollene Almarie LABOR, MD  lidocaine  4 % Place 1 patch onto the skin daily. Apply at 0800, remove at 2000.   Yes [provider]  losartan  (COZAAR ) 50 MG tablet TAKE 1 TABLET BY MOUTH DAILY 08/08/23  Yes Rollene Almarie LABOR, MD  mirabegron  ER (MYRBETRIQ ) 50 MG TB24 tablet TAKE 1 TABLET BY MOUTH DAILY **NEED APPOINTMENT FOR REFILLS** 08/02/23  Yes Rollene Almarie LABOR, MD  Multiple Vitamins-Minerals (PRESERVISION AREDS 2) CAPS Take 1 capsule by mouth in the morning and at bedtime.   Yes [provider]  omeprazole  (PRILOSEC) 20 MG capsule TAKE 1 CAPSULE BY MOUTH DAILY 08/22/23  Yes Rollene Almarie LABOR, MD  oxyCODONE  (OXY IR/ROXICODONE ) 5 MG immediate release tablet Take 1 tablet (5 mg total) by mouth every 4 (four) hours as needed for moderate pain (pain score 4-6). Patient taking differently: Take 5 mg by mouth at bedtime. 12/20/23  Yes Pahwani, Fredia, MD  Polyethyl Glycol-Propyl Glycol (SYSTANE ULTRA OP) Place 1 drop into both eyes in the morning and at bedtime.   Yes [provider]  pravastatin  (PRAVACHOL ) 20 MG tablet TAKE 1 TABLET BY MOUTH DAILY Patient taking differently: Take 20 mg by mouth at bedtime. 03/02/23  Yes Rollene Almarie LABOR, MD  traZODone  (DESYREL ) 50 MG tablet Take 50 mg by mouth at bedtime.   Yes [provider]  Xylitol (XYLIMELTS MT) Take 1 tablet by mouth at bedtime.   Yes [provider]  melatonin 3 MG TABS tablet  Take 6 mg by mouth at bedtime.    [provider]    Physical Exam: Vitals:   01/17/24 0549 01/17/24 0937 01/17/24 1317  BP: (!) 156/72 (!) 156/69 (!) 177/86  Pulse: 60 64 70  Resp: 20 18 20   Temp: 98 F (36.7 C) 97.7 F (36.5 C) 98 F (36.7 C)  TempSrc: Temporal Oral Oral  SpO2: 98% 99%    General: Alert, oriented x3, resting comfortably in no acute distress Respiratory: Lungs clear to auscultation bilaterally with normal respiratory effort; no w/r/r Cardiovascular: Regular rate and rhythm w/o m/r/g   Data Reviewed:  Lab Results  Component Value Date   WBC 7.4 01/17/2024   HGB 11.6 (L) 01/17/2024   HCT 37.4 01/17/2024   MCV 96.6 01/17/2024   PLT 157 01/17/2024   Lab Results  Component Value Date   GLUCOSE 93 01/17/2024   CALCIUM  8.9 01/17/2024   NA 137 01/17/2024   K 4.3 01/17/2024  CO2 25 01/17/2024   CL 105 01/17/2024   BUN 17 01/17/2024   CREATININE 0.83 01/17/2024   Lab Results  Component Value Date   ALT 10 01/17/2024   AST 26 01/17/2024   ALKPHOS 57 01/17/2024   BILITOT 0.6 01/17/2024   No results found for: INR, PROTIME  Radiology: CT Cervical Spine Wo Contrast Result Date: 01/17/2024 EXAM: CT CERVICAL SPINE WITHOUT CONTRAST 01/17/2024 09:19:08 AM TECHNIQUE: CT of the cervical spine was performed without the administration of intravenous contrast. Multiplanar reformatted images are provided for review. Automated exposure control, iterative reconstruction, and/or weight based adjustment of the mA/kV was utilized to reduce the radiation dose to as low as reasonably achievable. COMPARISON: CT of the cervical spine dated 06/12/2023. CLINICAL HISTORY: Neck trauma (Age >= 65y). She has history of hypertension, hyperlipidemia and comes in following a fall at home. She states that she got out of bed and that her legs gave out on her. She is complaining of pain pretty much everywhere. She denies loss of consciousness. FINDINGS: CERVICAL SPINE: BONES  AND ALIGNMENT: There is a mild dextrocurvature of the cervicothoracic spine. There is slight degenerative anterolisthesis at C4-5. DEGENERATIVE CHANGES: There is moderate chronic degenerative disc disease at C5-6 and C6-7, with posterior endplate ridging in both levels causing mild central spinal canal stenosis at both levels. There is fusion of the right facets at C2-3. There is diffuse bilateral facet arthrosis present throughout the remainder of the cervical spine, which is worse on the patient's left. SOFT TISSUES: No prevertebral soft tissue swelling. VASCULATURE: There is moderate calcific atheromatous disease within the carotid bulbs bilaterally. IMPRESSION: 1. No acute abnormality of the cervical spine related to the reported neck trauma. 2. Moderate chronic degenerative disc disease at C5-6 and C6-7 with mild central spinal canal stenosis at both levels. Electronically signed by: evalene coho 01/17/2024 10:35 AM EDT RP Workstation: [id number]   CT CHEST ABDOMEN PELVIS W CONTRAST Result Date: 01/17/2024 EXAM: CT CHEST, ABDOMEN AND PELVIS WITH CONTRAST 01/17/2024 09:19:08 AM TECHNIQUE: CT of the chest, abdomen and pelvis was performed with the administration of intravenous contrast. Multiplanar reformatted images are provided for review. Automated exposure control, iterative reconstruction, and/or weight based adjustment of the mA/kV was utilized to reduce the radiation dose to as low as reasonably achievable. COMPARISON: CT of the pelvis dated 12/16/2023 and CT of the chest dated 04/28/2022. CLINICAL HISTORY: Polytrauma, blunt. She has history of hypertension, hyperlipidemia and comes in following a fall at home. She states that she got out of bed and that her legs gave out on her. She is complaining of pain pretty much everywhere. She denies loss of consciousness. FINDINGS: CHEST: MEDIASTINUM: Heart and pericardium are unremarkable. The central airways are clear. There is moderate calcific  atheromatous disease at the aortic root and there is mild calcific coronary artery disease. THORACIC LYMPH NODES: No mediastinal, hilar or axillary lymphadenopathy. LUNGS AND PLEURA: There are ground-glass and reticular opacities present dependently within the lower lobes bilaterally. No pleural effusion or pneumothorax. ABDOMEN AND PELVIS: LIVER: The liver is unremarkable. GALLBLADDER AND BILE DUCTS: There is a calcified stone present within the gallbladder. No biliary ductal dilatation. SPLEEN: No acute abnormality. PANCREAS: No acute abnormality. ADRENAL GLANDS: No acute abnormality. KIDNEYS, URETERS AND BLADDER: The kidneys are atrophic and there are bilateral simple renal cysts including an exophytic cyst arising from the superior pole of the left kidney which measures nearly 7 cm in diameter. No stones in the ureters. No hydronephrosis. No perinephric or  periureteral stranding. Urinary bladder is unremarkable. GI AND BOWEL: Stomach demonstrates no acute abnormality. There is no bowel obstruction. REPRODUCTIVE ORGANS: The patient is status post hysterectomy. PERITONEUM AND RETROPERITONEUM: No ascites. No free air. VASCULATURE: The thoracic aorta demonstrates moderate calcific atheromatous disease. The descending thoracic aorta is tortuous. Aorta is normal in caliber. ABDOMINAL AND PELVIS LYMPH NODES: No lymphadenopathy. BONES AND SOFT TISSUES: There is mild levoscoliosis of the lumbar spine. There are moderate compression deformities of L1, L3 and L4. The L3 and L4 vertebral bodies are status post augmentation. There are edematous changes surrounding the L1 vertebral body, suggesting the fracture may be acute or recent or acute on chronic. The bony pelvis is intact. No focal soft tissue abnormality. IMPRESSION: 1. Moderate compression deformities of L1, L3, and L4, with edematous changes surrounding the L1 vertebral body suggesting the fracture may be acute or recent or acute on chronic. L3 and L4 vertebral  bodies are status post augmentation. Electronically signed by: evalene coho 01/17/2024 10:32 AM EDT RP Workstation: [id number]   CT Head Wo Contrast Result Date: 01/17/2024 EXAM: CT HEAD WITHOUT 01/17/2024 09:19:08 AM TECHNIQUE: CT of the head was performed without the administration of intravenous contrast. Automated exposure control, iterative reconstruction, and/or weight based adjustment of the mA/kV was utilized to reduce the radiation dose to as low as reasonably achievable. COMPARISON: CT of the head dated 06/11/2001. CLINICAL HISTORY: Head trauma, minor (Age >= 67y). She has history of hypertension, hyperlipidemia and comes in following a fall at home. She states that she got out of bed and that her legs gave out on her. She is complaining of pain pretty much everywhere. She denies loss of consciousness. FINDINGS: BRAIN AND VENTRICLES: No acute intracranial hemorrhage. No mass effect or midline shift. No extra-axial fluid collection. Gray-white differentiation is maintained. No hydrocephalus. There are dystrophic calcifications within the right posterior frontal lobe, as before. There is age-related cerebral and cerebellar volume loss and there is moderately advanced cerebral white matter disease. ORBITS: The patient is status post bilateral lens replacement. SINUSES AND MASTOIDS: There is mild mucosal disease within the sphenoid sinuses. SOFT TISSUES AND SKULL: No acute skull fracture. No acute soft tissue abnormality. There are extensive vascular calcifications within the carotid siphons. IMPRESSION: 1. No acute intracranial abnormality related to the reported head trauma. 2. Age-related cerebral and cerebellar volume loss and moderately advanced cerebral white matter disease. 3. Dystrophic calcifications within the right posterior frontal lobe, as before. 4. Extensive vascular calcifications within the carotid siphons. 5. Status post bilateral lens replacement. 6. Mild mucosal disease within the  sphenoid sinuses. Electronically signed by: evalene berrigan 01/17/2024 10:10 AM EDT RP Workstation: [id number]   DG Wrist Complete Right Result Date: 01/17/2024 CLINICAL DATA:  Clemens in the bathroom at home with bilateral wrist pain and bilateral ankle pain. EXAM: RIGHT WRIST - COMPLETE 3+ VIEW; LEFT WRIST - COMPLETE 3+ VIEW; RIGHT ANKLE - COMPLETE 3+ VIEW; LEFT ANKLE COMPLETE - 3+ VIEW COMPARISON:  None. FINDINGS: Three views of the right wrist: There is osteopenia without evidence of displaced fractures. There is joint space loss and spurring of the triscaphe and first CMC joints. Joint narrowing and spurring also involving the thumb interphalangeal joint. Other joints are maintained. There are dystrophic subcutaneous calcifications in the dorsoulnar distal forearm. Mild dorsal swelling at the wrist. Three views of left wrist: Osteopenia. There is a transverse nondisplaced fracture of the left distal radial metaphysis, with the slight cortical buckling at the lateral fracture margin. No further  evidence of fractures is seen. There is narrowing and osteophytes of the first CMC joint. Other joint spaces are maintained. There are dystrophic calcifications in the distal forearm and mild to moderate circumferential swelling at the left wrist. Three views right ankle: There is osteopenia without evidence of fractures. Joint spaces are maintained. There is moderate dorsal and plantar calcaneal enthesopathic spurring. There is moderate to severe soft tissue edema greatest laterally, with extension over the hindfoot and midfoot. There are dystrophic subcutaneous calcifications in the lateral distal foreleg. Three views left ankle: There is osteopenia without evidence of fractures. Joint spaces are maintained. There is moderate dorsal and plantar calcaneal enthesopathic spurring. There is severe circumferential edema. IMPRESSION: 1. Osteopenia. 2. Nondisplaced transverse fracture of the left distal radial metaphysis. 3.  No further evidence of fractures in the bilateral wrists and ankles. 4. Bilateral wrist and ankle soft tissue swelling. 5. Bilateral calcaneal enthesopathic spurring. 6. Dystrophic subcutaneous calcifications in the distal forearms and lateral distal right foreleg. Electronically Signed   By: Francis Quam M.D.   On: 01/17/2024 06:57   DG Wrist Complete Left Result Date: 01/17/2024 CLINICAL DATA:  Clemens in the bathroom at home with bilateral wrist pain and bilateral ankle pain. EXAM: RIGHT WRIST - COMPLETE 3+ VIEW; LEFT WRIST - COMPLETE 3+ VIEW; RIGHT ANKLE - COMPLETE 3+ VIEW; LEFT ANKLE COMPLETE - 3+ VIEW COMPARISON:  None. FINDINGS: Three views of the right wrist: There is osteopenia without evidence of displaced fractures. There is joint space loss and spurring of the triscaphe and first CMC joints. Joint narrowing and spurring also involving the thumb interphalangeal joint. Other joints are maintained. There are dystrophic subcutaneous calcifications in the dorsoulnar distal forearm. Mild dorsal swelling at the wrist. Three views of left wrist: Osteopenia. There is a transverse nondisplaced fracture of the left distal radial metaphysis, with the slight cortical buckling at the lateral fracture margin. No further evidence of fractures is seen. There is narrowing and osteophytes of the first CMC joint. Other joint spaces are maintained. There are dystrophic calcifications in the distal forearm and mild to moderate circumferential swelling at the left wrist. Three views right ankle: There is osteopenia without evidence of fractures. Joint spaces are maintained. There is moderate dorsal and plantar calcaneal enthesopathic spurring. There is moderate to severe soft tissue edema greatest laterally, with extension over the hindfoot and midfoot. There are dystrophic subcutaneous calcifications in the lateral distal foreleg. Three views left ankle: There is osteopenia without evidence of fractures. Joint spaces are  maintained. There is moderate dorsal and plantar calcaneal enthesopathic spurring. There is severe circumferential edema. IMPRESSION: 1. Osteopenia. 2. Nondisplaced transverse fracture of the left distal radial metaphysis. 3. No further evidence of fractures in the bilateral wrists and ankles. 4. Bilateral wrist and ankle soft tissue swelling. 5. Bilateral calcaneal enthesopathic spurring. 6. Dystrophic subcutaneous calcifications in the distal forearms and lateral distal right foreleg. Electronically Signed   By: Francis Quam M.D.   On: 01/17/2024 06:57   DG Ankle Complete Right Result Date: 01/17/2024 CLINICAL DATA:  Clemens in the bathroom at home with bilateral wrist pain and bilateral ankle pain. EXAM: RIGHT WRIST - COMPLETE 3+ VIEW; LEFT WRIST - COMPLETE 3+ VIEW; RIGHT ANKLE - COMPLETE 3+ VIEW; LEFT ANKLE COMPLETE - 3+ VIEW COMPARISON:  None. FINDINGS: Three views of the right wrist: There is osteopenia without evidence of displaced fractures. There is joint space loss and spurring of the triscaphe and first CMC joints. Joint narrowing and spurring also involving  the thumb interphalangeal joint. Other joints are maintained. There are dystrophic subcutaneous calcifications in the dorsoulnar distal forearm. Mild dorsal swelling at the wrist. Three views of left wrist: Osteopenia. There is a transverse nondisplaced fracture of the left distal radial metaphysis, with the slight cortical buckling at the lateral fracture margin. No further evidence of fractures is seen. There is narrowing and osteophytes of the first CMC joint. Other joint spaces are maintained. There are dystrophic calcifications in the distal forearm and mild to moderate circumferential swelling at the left wrist. Three views right ankle: There is osteopenia without evidence of fractures. Joint spaces are maintained. There is moderate dorsal and plantar calcaneal enthesopathic spurring. There is moderate to severe soft tissue edema greatest  laterally, with extension over the hindfoot and midfoot. There are dystrophic subcutaneous calcifications in the lateral distal foreleg. Three views left ankle: There is osteopenia without evidence of fractures. Joint spaces are maintained. There is moderate dorsal and plantar calcaneal enthesopathic spurring. There is severe circumferential edema. IMPRESSION: 1. Osteopenia. 2. Nondisplaced transverse fracture of the left distal radial metaphysis. 3. No further evidence of fractures in the bilateral wrists and ankles. 4. Bilateral wrist and ankle soft tissue swelling. 5. Bilateral calcaneal enthesopathic spurring. 6. Dystrophic subcutaneous calcifications in the distal forearms and lateral distal right foreleg. Electronically Signed   By: Francis Quam M.D.   On: 01/17/2024 06:57   DG Ankle Complete Left Result Date: 01/17/2024 CLINICAL DATA:  Clemens in the bathroom at home with bilateral wrist pain and bilateral ankle pain. EXAM: RIGHT WRIST - COMPLETE 3+ VIEW; LEFT WRIST - COMPLETE 3+ VIEW; RIGHT ANKLE - COMPLETE 3+ VIEW; LEFT ANKLE COMPLETE - 3+ VIEW COMPARISON:  None. FINDINGS: Three views of the right wrist: There is osteopenia without evidence of displaced fractures. There is joint space loss and spurring of the triscaphe and first CMC joints. Joint narrowing and spurring also involving the thumb interphalangeal joint. Other joints are maintained. There are dystrophic subcutaneous calcifications in the dorsoulnar distal forearm. Mild dorsal swelling at the wrist. Three views of left wrist: Osteopenia. There is a transverse nondisplaced fracture of the left distal radial metaphysis, with the slight cortical buckling at the lateral fracture margin. No further evidence of fractures is seen. There is narrowing and osteophytes of the first CMC joint. Other joint spaces are maintained. There are dystrophic calcifications in the distal forearm and mild to moderate circumferential swelling at the left wrist. Three  views right ankle: There is osteopenia without evidence of fractures. Joint spaces are maintained. There is moderate dorsal and plantar calcaneal enthesopathic spurring. There is moderate to severe soft tissue edema greatest laterally, with extension over the hindfoot and midfoot. There are dystrophic subcutaneous calcifications in the lateral distal foreleg. Three views left ankle: There is osteopenia without evidence of fractures. Joint spaces are maintained. There is moderate dorsal and plantar calcaneal enthesopathic spurring. There is severe circumferential edema. IMPRESSION: 1. Osteopenia. 2. Nondisplaced transverse fracture of the left distal radial metaphysis. 3. No further evidence of fractures in the bilateral wrists and ankles. 4. Bilateral wrist and ankle soft tissue swelling. 5. Bilateral calcaneal enthesopathic spurring. 6. Dystrophic subcutaneous calcifications in the distal forearms and lateral distal right foreleg. Electronically Signed   By: Francis Quam M.D.   On: 01/17/2024 06:57    Assessment and Plan: 75F h/o ?dementia, moderate aortic stenosis, LBBB, HTN, hypothyroidism, multiple GLFs, and recent admission in 12/2023 for L1 compression fracture p/w recurrent GLF and new L wrist fracture.  L distal radius fracture iso GLF -Ortho consulted and splint applied -PT/OT consulted; apprec eval/recs -PO tylenol  prn for pain control; will defer opioids for now given h/o recurrent falls; will re-assess prn -HOLD pta Xanax , tramadol , oxycodone , and gabapentin  as these are likely exacerbating pt falls  HTN -PTA losartan  50mg  daily  Hypothyroid -PTA synthroid  50mcg daily  Candida intertrigo involving b/l breasts and groing -Nystatin  powder BID   Advance Care Planning:   Code Status: Limited: Do not attempt resuscitation (DNR) -DNR-LIMITED -Do Not Intubate/DNI    Consults: N/A  Family Communication: Daughters  Severity of Illness: The appropriate patient status for this patient  is OBSERVATION. Observation status is judged to be reasonable and necessary in order to provide the required intensity of service to ensure the patient's safety. The patient's presenting symptoms, physical exam findings, and initial radiographic and laboratory data in the context of their medical condition is felt to place them at decreased risk for further clinical deterioration. Furthermore, it is anticipated that the patient will be medically stable for discharge from the hospital within 2 midnights of admission.    ------- I spent 55 minutes reviewing previous labs/notes, obtaining separate history at the bedside, counseling/discussing the treatment plan outlined above, ordering medications/tests, and performing clinical documentation.  Author: Marsha Ada, MD 01/17/2024 2:35 PM  For on call review [email or web address].

## 2024-01-17 NOTE — TOC Initial Note (Signed)
 Transition of Care [facility]) - Initial/Assessment Note    Patient Details  Name: Deanna Olson MRN: [id number] Date of Birth: [birth year]  Transition [facility] Care [facility]) CM/SW Contact:    Rosalva Jon Bloch, RN Phone Number: 01/17/2024, 3:52 PM  Clinical Narrative:                 Presents with c/o back pain, found to have L1 compression fx. Hx of  hypertension, hyperlipidemia, Alzheimer's dementia.  From [facility]  ALF. Supportive daughter, Macario. Pt with recent SNF rehab admission to [facility] 7/8. Daughter states pt was @ [facility] x 1 week and transitioned back to ALF with in house therapy.  PT evaluation pending...  TOC team following and will assist with needs.  Expected Discharge Plan: Skilled Nursing Facility Barriers to Discharge: Continued Medical Work up   Patient Goals and CMS Choice Patient states their goals for this hospitalization and ongoing recovery are:: SNF prior to return to [facility] CMS Medicare.gov Compare Post Acute Care list provided to:: Patient Represenative (must comment) Floria Sor (dtr))   [place] ownership interest in [facility].provided to:: Adult Children    Expected Discharge Plan and Services   Discharge Planning Services: CM Consult   Living arrangements for the past 2 months: Assisted Living Facility Expected Discharge Date: 12/20/23               DME Arranged: N/A DME Agency: NA                  Prior Living Arrangements/Services Living arrangements for the past 2 months: Assisted Living Facility Lives with:: Facility Resident Patient language and need for interpreter reviewed:: Yes Do you feel safe going back to the place where you live?: Yes      Need for Family Participation in Patient Care: Yes (Comment) Care giver support system in place?: Yes (comment) Current home services: DME (RW, W/C) Criminal Activity/Legal Involvement Pertinent to Current Situation/Hospitalization: No - Comment as needed  Activities of Daily  Living   ADL Screening (condition at time of admission) Independently performs ADLs?: Yes (appropriate for developmental age) Is the patient deaf or have difficulty hearing?: Yes Does the patient have difficulty seeing, even when wearing glasses/contacts?: No Does the patient have difficulty concentrating, remembering, or making decisions?: Yes  Permission Sought/Granted Permission sought to share information with : Case Manager Permission granted to share information with : Yes, Verbal Permission Granted  Share Information with NAME: Case Manager     Permission granted to share info w Relationship: Macario Dunker (dtr) [phone]     Emotional Assessment Appearance:: Appears stated age Attitude/Demeanor/Rapport: Unable to Assess Affect (typically observed): Unable to Assess Orientation: : Oriented to Self, Oriented to Place Alcohol / Substance Use: Not Applicable Psych Involvement: No (comment)  Admission diagnosis:  Lumbar compression fracture [facility]) [S32.000A] Patient Active Problem List   Diagnosis Date Noted   Fall 01/17/2024   Lumbar compression fracture (HCC) 12/16/2023   COVID 06/13/2023   Elevated CK 04/28/2023   Alzheimer disease (HCC) 02/17/2023   OAB (overactive bladder) 02/17/2023   LBBB (left bundle branch block) 02/28/2022   Bilateral hearing loss 08/15/2020   Neuropathy 12/27/2019   Osteoporosis 02/20/2018   Cough 02/20/2018   Rash 01/27/2017   Low back pain 07/06/2016   Right knee pain 07/06/2016   Chronic kidney disease, stage 3a (HCC) 08/13/2014   Routine general medical examination at a health care facility 08/13/2014   Vitamin D  deficiency 12/02/2009   Aortic stenosis,  mild 01/24/2008   DEGENERATIVE JOINT DISEASE 11/05/2007   Hypothyroidism 10/31/2007   Essential hypertension 10/31/2007   Allergic rhinitis 10/31/2007   Mixed hyperlipidemia 04/12/2007   Anxiety state 04/12/2007   GERD without esophagitis 04/12/2007   PCP:  Patient, No Pcp  Per Pharmacy:   [facility], [place] - [address] [place] [address] Phone: [phone] Fax: [phone]  [facility] [address] [place] [address] Phone: [phone] Fax: [phone]     Social Drivers of Health (SDOH) Social History: SDOH Screenings   Food Insecurity: Patient Unable To Answer (01/17/2024)  Housing: Unknown (01/17/2024)  Transportation Needs: Patient Unable To Answer (01/17/2024)  Utilities: Patient Unable To Answer (01/17/2024)  Alcohol Screen: Low Risk  (02/13/2021)  Depression (PHQ2-9): Medium Risk (07/11/2023)  Financial Resource Strain: Low Risk  (12/24/2022)  Physical Activity: Inactive (12/24/2022)  Social Connections: Unknown (01/17/2024)  Stress: No Stress Concern Present (12/24/2022)  Tobacco Use: Low Risk  (01/17/2024)  Health Literacy: Adequate Health Literacy (12/24/2022)   SDOH Interventions: Food Insecurity Interventions: Intervention Not Indicated, Inpatient TOC Housing Interventions: Intervention Not Indicated, Inpatient TOC Transportation Interventions: Intervention Not Indicated, Inpatient TOC Utilities Interventions: Intervention Not Indicated, Inpatient TOC   Readmission Risk Interventions     No data to display

## 2024-01-17 NOTE — ED Notes (Signed)
 Patient transported to CT

## 2024-01-17 NOTE — ED Provider Notes (Signed)
 I received the patient in signout.  Patient presents after a fall at home.  The patient does have a nondisplaced wrist fracture and has been placed in a splint.  Additional imaging is pending at the time of signout.  Plan is for reassessment after imaging studies for ultimate disposition. Physical Exam  BP (!) 156/72 (BP Location: Left Arm)   Pulse 60   Temp 98 F (36.7 C) (Temporal)   Resp 20   SpO2 98%   Physical Exam General: Confused Neuro: Equal strength sensation throughout bilateral upper and lower extremities Procedures  Procedures  ED Course / MDM    Medical Decision Making Amount and/or Complexity of Data Reviewed Labs: ordered. Radiology: ordered.  Risk Prescription drug management. Decision regarding hospitalization.   The patient CT scans redemonstrate the L1 compression fracture.  Plan during last admission was for conservative management and neurosurgery follow-up.  The patient does have a TLSO brace.  In speaking with the patient's family they state that she was in rehab after getting out of the hospital last month but is now back in assisted living.  She has had multiple falls over the past 4 days.  Was using a walker prior to this.  In light of this I think that she would benefit from admission.  Given her dementia and intermittent confusion I do not think that she is an appropriate ED observation candidate.  Calls placed to hospital service for admission.       Ula Prentice SAUNDERS, MD 01/17/24 1059

## 2024-01-17 NOTE — ED Notes (Signed)
 EDP at bedside, pt refusing to walk. Per daughters at bedside, she is afraid she will fall so has been refusing to stand or walk. Pt now has left arm in splint and can not hold on to walker.

## 2024-01-17 NOTE — ED Notes (Signed)
 Pt assisted with bed pan

## 2024-01-17 NOTE — ED Notes (Signed)
 Phlebotomy at bedside, lunch tray ordered

## 2024-01-17 NOTE — Progress Notes (Signed)
 Orthopedic Tech Progress Note Patient Details:  Deanna Olson [birth year] [id number]  Ortho Devices Type of Ortho Device: Cotton web roll, Ace wrap, Arm sling, Sugartong splint Ortho Device/Splint Location: LUE Ortho Device/Splint Interventions: Ordered, Application, Adjustmentremoved patient rings and gave them to her daughter's who were at the bedside. Patient needs to use restroom notified RN    Post Interventions Patient Tolerated: Well Instructions Provided: Care of device  Deanna Olson Pac 01/17/2024, 8:03 AM

## 2024-01-17 NOTE — ED Triage Notes (Signed)
 Pt had a fall in her bathroom. Pt denies hitting her head. Pt also having left wrist, hand and ankle pain

## 2024-01-18 ENCOUNTER — Observation Stay (HOSPITAL_COMMUNITY)

## 2024-01-18 DIAGNOSIS — R296 Repeated falls: Secondary | ICD-10-CM

## 2024-01-18 DIAGNOSIS — S52502A Unspecified fracture of the lower end of left radius, initial encounter for closed fracture: Secondary | ICD-10-CM | POA: Diagnosis present

## 2024-01-18 DIAGNOSIS — I350 Nonrheumatic aortic (valve) stenosis: Secondary | ICD-10-CM | POA: Diagnosis not present

## 2024-01-18 MED ORDER — ACETAMINOPHEN 500 MG PO TABS
500.0000 mg | ORAL_TABLET | Freq: Three times a day (TID) | ORAL | Status: DC
  Administered 2024-01-18 – 2024-01-20 (×6): 500 mg via ORAL
  Filled 2024-01-18 (×6): qty 1

## 2024-01-18 MED ORDER — OXYCODONE HCL 5 MG PO TABS: 5.0000 mg | ORAL_TABLET | ORAL | Status: DC | PRN

## 2024-01-18 MED ORDER — ACETAMINOPHEN 500 MG PO TABS: 500.0000 mg | ORAL_TABLET | ORAL | Status: DC | PRN

## 2024-01-18 MED ORDER — ALPRAZOLAM 0.5 MG PO TABS
0.5000 mg | ORAL_TABLET | Freq: Two times a day (BID) | ORAL | Status: DC
  Administered 2024-01-18 – 2024-01-20 (×5): 0.5 mg via ORAL
  Filled 2024-01-18 (×5): qty 1

## 2024-01-18 MED ORDER — LIDOCAINE 4 % EX PTCH: 1.0000 | MEDICATED_PATCH | Freq: Every day | CUTANEOUS | Status: DC

## 2024-01-18 MED ORDER — GABAPENTIN 300 MG PO CAPS
300.0000 mg | ORAL_CAPSULE | Freq: Every day | ORAL | Status: DC
  Administered 2024-01-18 – 2024-01-19 (×2): 300 mg via ORAL
  Filled 2024-01-18 (×2): qty 1

## 2024-01-18 MED ORDER — LIDOCAINE 5 % EX PTCH
1.0000 | MEDICATED_PATCH | Freq: Every day | CUTANEOUS | Status: DC
  Administered 2024-01-18 – 2024-01-20 (×3): 1 via TRANSDERMAL
  Filled 2024-01-18 (×3): qty 1

## 2024-01-18 MED ORDER — TRAZODONE HCL 50 MG PO TABS
50.0000 mg | ORAL_TABLET | Freq: Every day | ORAL | Status: DC
  Administered 2024-01-18 – 2024-01-19 (×2): 50 mg via ORAL
  Filled 2024-01-18 (×2): qty 1

## 2024-01-18 MED ORDER — GABAPENTIN 100 MG PO CAPS
100.0000 mg | ORAL_CAPSULE | Freq: Every morning | ORAL | Status: DC
  Administered 2024-01-18 – 2024-01-20 (×3): 100 mg via ORAL
  Filled 2024-01-18 (×3): qty 1

## 2024-01-18 NOTE — Hospital Course (Signed)
 Patient with PMH of dementia, moderately severe aortic stenosis, HTN, hypothyroidism, recurrent ground-level falls present to the hospital with complaints of another fall. Patient has prior history of recurrent fall at home. Due to which family brought the patient to ALF at [facility]. Patient continues to have frequent fall at the facility. Was recently hospitalized for a fall leading to L1 compression fracture on 12/16/2023. Was discharged to rehab.  From there patient was discharged back to [facility] and was receiving physical therapy. Lately she has not been wearing her TLSO brace. She is also complaining of worsening pain and therefore has been refusing to walk and currently being pushed around in a wheelchair. On 8/5 early morning, patient attempted to walk out of her bed and sustained a fall with swelling of her left wrist. Patient is unable to remember the fall.  Patient has severe aortic stenosis as well as need for multiple psychotropic medications for symptom control.  With history of recurrent fall.  Family understands poor prognosis and currently transitioning to hospice upon discharge.  Assessment and Plan: Recurrent fall. It appears that the patient has progressively worsening balance. She is on medicines like trazodone , Xanax  as well as gabapentin  which per family is necessary for symptom control. She appears to have severe aortic stenosis diagnosed in 2023 and not a candidate for any aggressive intervention. Suspect that the fall is combination of polypharmacy, dementia, deconditioning as well as severe aortic stenosis. Given the frequency, expect that it will continue to get worse as the time progresses. PT OT was consulted and recommendation was for SNF although after prolonged discussion with the family, given lack of definitive treatment to reduce the fall frequency, family has decided to transition to focus or care on comfort.  Recommended considering hospice at [facility] and family is currently agreeable to it for now.  Moderate to severe aortic stenosis. Echocardiogram 2023 shows EF 60 to 65%. Aortic valve area by VTI 0.99 meeting criteria for severe aortic stenosis. V-max is 3.09. Patient was deemed an not a good candidate and did not want any acute intervention for the valve and therefore no further workup was recommended by cardiology. At present appears that the patient is presenting with symptomatic aortic stenosis which renders poor prognosis without a mechanical repair. Family decided transition to comfort.  Monitor.  L1 compression fracture. Prior history of lumbar compression fractures. Recommended TLSO brace although patient is not wearing it. Neurosurgery was consulted during last admission. Given her tendency for frequent fall, not agreed for any aggressive intervention. No focal deficit right now.  Nondisplaced distal radius fracture on the left. Splint recommended as well as sling although patient currently noncompliant. Continue pain control.  Dementia. Progressively worsening per family. Patient has been increasingly forgetful. This is leading to increasing anxiety as well Per family patient does not do well without her Xanax .  Which I will continue for now. Also on trazodone  at nighttime which I will continue.  Chronic neuropathy. On gabapentin . Will continue the same at home dose. Placing the patient at a high risk for recurrent fall.  Hypothyroidism. Continue Synthroid .  HTN. Blood pressure is elevated. Resume losartan . Less likely orthostatic  HLD. For now continue statin. Although low threshold to discontinue this medication.  Overactive bladder. On Myrbetriq .  Continue.  Goals of care conversation. Patient is DNR/DNI at the time of the admission. Appears to have progressive aortic stenosis which was most likely causing symptomatic syncope. Also has risk of polypharmacy but would not do  well with her  anxiety without Xanax  and would not do well with her neuropathy with burning pain without gabapentin . Has been noncompliant with splint here in the hospital for a wrist fracture as well as TLSO brace at the facility. At present family understands patient's current status and would like to focus more on comfort approach rather than aggressive intervention against her will which would not make any significant difference to the aortic stenosis for which the patient is not a candidate for intervention. Current goal is to transition to comfort with hospice on discharge [facility]. Await arrangement of DME/[facility].

## 2024-01-18 NOTE — TOC Initial Note (Signed)
 Transition of Care [facility]) - Initial/Assessment Note    Patient Details  Name: Deanna Olson MRN: [id number] Date of Birth: [birth year]  Transition [facility]) CM/SW Contact:    Bridget Cordella Simmonds, LCSW Phone Number: 01/18/2024, 2:16 PM  Clinical Narrative:     Pt oriented x1, was able to answer a few questions, pleasant.  Information from all three daughters--Lynn and Leita in room, Luann on speakerphone.  Pt from [facility], in assisted living level of care, has been working with [employer] PT.  Pt recently at [facility] for STR, did not do well with PT, not cognitively able to retain coaching from PT. Discussed PT recommendation for SNF.  Daughters do not want to pursue SNF at this time and are interested in pt returning to [facility] with hospice care.  Hospice choice discussed, medicare choice document provided, they will review and decide on hospice provider.  They have meeting at [facility] in one hour and will discuss all this with [facility].  Definitely will need hospital bed and DME.                 Expected Discharge Plan: Assisted Living ([facility] with hospice) Barriers to Discharge: Other (must enter comment) (hospice acceptance and DME)   Patient Goals and CMS Choice     Choice offered to / list presented to : Adult Children (all three daughters-Lynn, Laura, Luann)      Expected Discharge Plan and Services In-house Referral: Clinical Social Work   Post Acute Care Choice: Resumption of Svcs/PTA Provider ([facility]) Living arrangements for the past 2 months: Assisted Living Facility                                      Prior Living Arrangements/Services Living arrangements for the past 2 months: Assisted Living Facility Lives with:: Facility Resident Patient language and need for interpreter reviewed:: Yes        Need for Family Participation in Patient Care: Yes (Comment) Care giver support system in place?: Yes (comment) Current  home services: Home PT ([facility]) Criminal Activity/Legal Involvement Pertinent to Current Situation/Hospitalization: No - Comment as needed  Activities of Daily Living      Permission Sought/Granted                  Emotional Assessment Appearance:: Appears stated age Attitude/Demeanor/Rapport: Unable to Assess Affect (typically observed): Pleasant Orientation: : Oriented to Self      Admission diagnosis:  Fall [W19.XXXA] Forehead contusion, initial encounter [S00.83XA] Fall at home, initial encounter [W19.XXXA, Y92.009] Closed fracture of distal end of left radius, initial encounter [S52.502A] Closed compression fracture of body of L1 vertebra (HCC) [S32.010A] Dementia, unspecified dementia severity, unspecified dementia type, unspecified whether behavioral, psychotic, or mood disturbance or anxiety (HCC) [F03.90] Patient Active Problem List   Diagnosis Date Noted   Fall 01/17/2024   Lumbar compression fracture (HCC) 12/16/2023   COVID 06/13/2023   Elevated CK 04/28/2023   Alzheimer disease (HCC) 02/17/2023   OAB (overactive bladder) 02/17/2023   LBBB (left bundle branch block) 02/28/2022   Bilateral hearing loss 08/15/2020   Neuropathy 12/27/2019   Osteoporosis 02/20/2018   Cough 02/20/2018   Rash 01/27/2017   Low back pain 07/06/2016   Right knee pain 07/06/2016   Chronic kidney disease, stage 3a (HCC) 08/13/2014   Routine general medical examination at a health care  facility 08/13/2014   Vitamin D  deficiency 12/02/2009   Aortic stenosis, mild 01/24/2008   DEGENERATIVE JOINT DISEASE 11/05/2007   Hypothyroidism 10/31/2007   Essential hypertension 10/31/2007   Allergic rhinitis 10/31/2007   Mixed hyperlipidemia 04/12/2007   Anxiety state 04/12/2007   GERD without esophagitis 04/12/2007   PCP:  Patient, No Pcp Per Pharmacy:   [facility], [place] - [address] [place] [address] Phone: [phone] Fax: [phone]  [facility] [address] [place] [address] Phone: [phone] Fax: [phone]     Social Drivers of Health (SDOH) Social History: SDOH Screenings   Food Insecurity: Patient Unable To Answer (01/17/2024)  Housing: Unknown (01/17/2024)  Transportation Needs: Patient Unable To Answer (01/17/2024)  Utilities: Patient Unable To Answer (01/17/2024)  Alcohol Screen: Low Risk  (02/13/2021)  Depression (PHQ2-9): Medium Risk (07/11/2023)  Financial Resource Strain: Low Risk  (12/24/2022)  Physical Activity: Inactive (12/24/2022)  Social Connections: Unknown (01/17/2024)  Stress: No Stress Concern Present (12/24/2022)  Tobacco Use: Low Risk  (01/17/2024)  Health Literacy: Adequate Health Literacy (12/24/2022)   SDOH Interventions:     Readmission Risk Interventions     No data to display

## 2024-01-18 NOTE — Progress Notes (Signed)
 Transition of Care [facility]) - CAGE-AID Screening   Patient Details  Name: Deanna Olson MRN: [id number] Date of Birth: 1925/08/14  Transition of Care [facility]) CM/SW Contact:    Bernardino Mayotte, RN Phone Number: 01/18/2024, 8:40 PM   Clinical Narrative:  Patient denies the use of alcohol and illicit substances. Resources not given at this time.  CAGE-AID Screening:    Have You Ever Felt You Ought to Cut Down on Your Drinking or Drug Use?: No Have People Annoyed You By Critizing Your Drinking Or Drug Use?: No Have You Felt Bad Or Guilty About Your Drinking Or Drug Use?: No Have You Ever Had a Drink or Used Drugs First Thing In The Morning to Steady Your Nerves or to Get Rid of a Hangover?: No CAGE-AID Score: 0  Substance Abuse Education Offered: No

## 2024-01-18 NOTE — Progress Notes (Signed)
 Orthopedic Tech Progress Note Patient Details:  Deanna Olson [birth year] [id number] Size small TLSO and extension panels were delivered to bedside.  Ortho Devices Type of Ortho Device: Thoracolumbar corset (TLSO) Ortho Device/Splint Location: LUE Ortho Device/Splint Interventions: Ordered   Post Interventions Patient Tolerated: Well Instructions Provided: Care of device  Deanna Olson E Deanna Olson 01/18/2024, 10:25 AM

## 2024-01-18 NOTE — Progress Notes (Signed)
 [facility] Progress Note Patient: Deanna Olson FMW:[id number] DOB: 1925-09-12 DOA: 01/17/2024  DOS: the patient was seen and examined on 01/18/2024  Brief Hospital Course: Patient with PMH of dementia, moderately severe aortic stenosis, HTN, hypothyroidism, recurrent ground-level falls present to the hospital with complaints of another fall. Patient has prior history of recurrent fall at home. Due to which family brought the patient to ALF at [facility]. Patient continues to have frequent fall at the facility. Was recently hospitalized for a fall leading to L1 compression fracture on 12/16/2023. Was discharged to rehab.  From there patient was discharged back to [facility] and was receiving physical therapy. Lately she has not been wearing her TLSO brace. She is also complaining of worsening pain and therefore has been refusing to walk and currently being pushed around in a wheelchair. On 8/5 early morning, patient attempted to walk out of her bed and sustained a fall with swelling of her left wrist. Patient is unable to remember the fall.  Assessment and Plan: Recurrent fall. It appears that the patient has progressively worsening balance. She is on medicines like trazodone , Xanax  as well as gabapentin  which per family is necessary for symptom control. She appears to have severe aortic stenosis diagnosed in 2023 and not a candidate for any aggressive intervention. Suspect that the fall is combination of polypharmacy, dementia, deconditioning as well as severe aortic stenosis. Given the frequency, expect that it will continue to get worse as the time progresses. PT OT was consulted and recommendation was for SNF although after prolonged discussion with the family, given lack of definitive treatment to reduce the fall frequency, family has decided to transition to focus or care on comfort.  Recommended considering hospice at [facility] and family is currently agreeable to it for  now.  Moderate to severe aortic stenosis. Echocardiogram 2023 shows EF 60 to 65%. Aortic valve area by VTI 0.99 meeting criteria for severe aortic stenosis. V-max is 3.09. Patient was deemed an not a good candidate and did not want any acute intervention for the valve and therefore no further workup was recommended by cardiology. At present appears that the patient is presenting with symptomatic aortic stenosis which renders poor prognosis without a mechanical repair. Family decided transition to comfort.  Monitor.  L1 compression fracture. Prior history of lumbar compression fractures. Recommended TLSO brace although patient is not wearing it. Neurosurgery was consulted during last admission. Given her tendency for frequent fall, not agreed for any aggressive intervention. No focal deficit right now.  Nondisplaced distal radius fracture on the left. Splint recommended as well as sling although patient currently noncompliant. Continue pain control.  Dementia. Progressively worsening per family. Patient has been increasingly forgetful. This is leading to increasing anxiety as well Per family patient does not do well without her Xanax .  Which I will continue for now. Also on trazodone  at nighttime which I will continue.  Chronic neuropathy. On gabapentin . Will continue the same at home dose. Placing the patient at a high risk for recurrent fall.  Hypothyroidism. Continue Synthroid .  HTN. Blood pressure is elevated. Resume losartan . Less likely orthostatic  HLD. For now continue statin. Although low threshold to discontinue this medication.  Overactive bladder. On Myrbetriq .  Continue.  Goals of care conversation. Patient is DNR/DNI at the time of the admission. Appears to have progressive aortic stenosis which was most likely causing symptomatic syncope. Also has risk of polypharmacy but would not do well with her anxiety without Xanax  and would not  do well with her  neuropathy with burning pain without gabapentin . Has been noncompliant with splint here in the hospital for a wrist fracture as well as TLSO brace at the facility. At present family understands patient's current status and would like to focus more on comfort approach rather than aggressive intervention against her will which would not make any significant difference to the aortic stenosis for which the patient is not a candidate for intervention. Current goal is to transition to comfort with hospice on discharge [facility]. Await arrangement of DME/[facility].    Subjective: Pain well-controlled.  No nausea or vomiting.  Oriented to self but unable to follow commands consistently.  Very hard of hearing.  Physical Exam: Hard of hearing. Alert to self. No asterixis. Able to move lower extremities. Bowel sound present.  Nontender. S1-S2 present. Significant aortic systolic murmur heard. Left upper extremity edema and bruising seen.  Data Reviewed: I have Reviewed nursing notes, Vitals, and Lab results. Since last encounter, pertinent lab results CBC and BMP   .   Disposition: Status is: Observation Place and maintain sequential compression device Start: 01/17/24 1436   Family Communication: Discussed with family at bedside. Level of care: Med-Surg   Vitals:   01/17/24 1953 01/18/24 0416 01/18/24 0451 01/18/24 1600  BP: (!) 172/83 129/67 (!) 131/96 (!) 163/96  Pulse: 81 76 (!) 109 76  Resp: 20 18 18 16   Temp: 97.8 F (36.6 C) 98.2 F (36.8 C) 97.8 F (36.6 C) 97.7 F (36.5 C)  TempSrc: Oral Oral  Oral  SpO2: 97% 97% 93% 96%     Author: Yetta Blanch, MD 01/18/2024 4:57 PM  Please look on [email or web address] to find out who is on call.

## 2024-01-18 NOTE — NC FL2 (Signed)
 [place]  MEDICAID FL2 LEVEL OF CARE FORM     IDENTIFICATION  Patient Name: Deanna Olson Birthdate: 1926-02-19 Sex: female Admission Date (Current Location): 01/17/2024  [facility] and [place] Number:  Producer, television/film/video and Address:  [facility], [address], [place], [place] [address]      Provider Number: [id number]  Attending Physician Name and Address:  Tobie Yetta HERO, MD  Relative Name and Phone Number:  Annabelle Helling Daughter [phone]    Current Level of Care: Hospital Recommended Level of Care: Assisted Living Facility ([facility]) Prior Approval Number:    Date Approved/Denied:   PASRR Number:    Discharge Plan: Other (Comment) ([facility])    Current Diagnoses: Patient Active Problem List   Diagnosis Date Noted   Fall 01/17/2024   Lumbar compression fracture (HCC) 12/16/2023   COVID 06/13/2023   Elevated CK 04/28/2023   Alzheimer disease (HCC) 02/17/2023   OAB (overactive bladder) 02/17/2023   LBBB (left bundle branch block) 02/28/2022   Bilateral hearing loss 08/15/2020   Neuropathy 12/27/2019   Osteoporosis 02/20/2018   Cough 02/20/2018   Rash 01/27/2017   Low back pain 07/06/2016   Right knee pain 07/06/2016   Chronic kidney disease, stage 3a (HCC) 08/13/2014   Routine general medical examination at a health care facility 08/13/2014   Vitamin D  deficiency 12/02/2009   Aortic stenosis, mild 01/24/2008   DEGENERATIVE JOINT DISEASE 11/05/2007   Hypothyroidism 10/31/2007   Essential hypertension 10/31/2007   Allergic rhinitis 10/31/2007   Mixed hyperlipidemia 04/12/2007   Anxiety state 04/12/2007   GERD without esophagitis 04/12/2007    Orientation RESPIRATION BLADDER Height & Weight     Self  Normal Incontinent Weight:   Height:     BEHAVIORAL SYMPTOMS/MOOD NEUROLOGICAL BOWEL NUTRITION STATUS      Incontinent Diet (regular diet)  AMBULATORY STATUS COMMUNICATION OF NEEDS Skin   Total Care Verbally  Other (Comment) (ecchymosis, redness)                       Personal Care Assistance Level of Assistance  Bathing, Feeding, Dressing Bathing Assistance: Maximum assistance Feeding assistance: Limited assistance Dressing Assistance: Maximum assistance     Functional Limitations Info  Sight, Hearing, Speech Sight Info: Adequate Hearing Info: Impaired Speech Info: Adequate    SPECIAL CARE FACTORS FREQUENCY                       Contractures Contractures Info: Not present    Additional Factors Info  Code Status, Allergies Code Status Info: DNR Allergies Info: Sulfonamide Derivatives           Current Medications (01/18/2024):  This is the current hospital active medication list Current Facility-Administered Medications  Medication Dose Route Frequency Provider Last Rate Last Admin   acetaminophen  (TYLENOL ) tablet 500 mg  500 mg Oral Q4H PRN Patel, Pranav M, MD       acetaminophen  (TYLENOL ) tablet 500 mg  500 mg Oral Q8H Patel, Pranav M, MD   500 mg at 01/18/24 1326   ALPRAZolam  (XANAX ) tablet 0.5 mg  0.5 mg Oral BID Patel, Pranav M, MD   0.5 mg at 01/18/24 1326   diclofenac  Sodium (VOLTAREN ) 1 % topical gel 2 g  2 g Topical QID Georgina Basket, MD   2 g at 01/18/24 1327   gabapentin  (NEURONTIN ) capsule 100 mg  100 mg Oral q morning Patel, Pranav M, MD   100  mg at 01/18/24 1326   gabapentin  (NEURONTIN ) capsule 300 mg  300 mg Oral QHS Patel, Pranav M, MD       levothyroxine  (SYNTHROID ) tablet 50 mcg  50 mcg Oral QAC breakfast Georgina Basket, MD   50 mcg at 01/18/24 0552   lidocaine  (LIDODERM ) 5 % 1 patch  1 patch Transdermal Daily Reome, Earle J, RPH   1 patch at 01/18/24 1327   losartan  (COZAAR ) tablet 50 mg  50 mg Oral Daily Georgina Basket, MD   50 mg at 01/18/24 1004   nystatin  (MYCOSTATIN /NYSTOP ) topical powder   Topical BID Georgina Basket, MD   Given at 01/18/24 1005   oxyCODONE  (Oxy IR/ROXICODONE ) immediate release tablet 5 mg  5 mg Oral Q4H PRN Patel, Pranav M, MD        pantoprazole  (PROTONIX ) EC tablet 40 mg  40 mg Oral Daily Georgina Basket, MD   40 mg at 01/18/24 1004   traZODone  (DESYREL ) tablet 50 mg  50 mg Oral QHS Patel, Pranav M, MD         Discharge Medications: Please see discharge summary for a list of discharge medications.  Relevant Imaging Results:  Relevant Lab Results:   Additional Information SSN [id number]  Bridget Cordella Simmonds, LCSW

## 2024-01-18 NOTE — Care Management Obs Status (Signed)
 MEDICARE OBSERVATION STATUS NOTIFICATION   Patient Details  Name: Deanna Olson MRN: [id number] Date of Birth: Oct 22, 1925   Medicare Observation Status Notification Given:  Yes    Bridget Cordella Simmonds, LCSW 01/18/2024, 1:23 PM

## 2024-01-18 NOTE — Progress Notes (Signed)
 Pt is alert, oriented x 1 to self, stable hemodynamically, afebrile. Pain on left wrist is well tolerated.  Pt repeatedly takes the left arm splint off. She pulled IV out on day shift yesterday. We have to put soft mittens on her hands. Pt has not had IV access since day shift. MD is aware that Pt has no IV access and no IV med. Plan of care is reviewed. We will continue to monitor.   Wendi Dash, RN

## 2024-01-18 NOTE — Evaluation (Signed)
 Occupational Therapy Evaluation Patient Details Name: Deanna Olson MRN: [id number] DOB: [birth year] Today's Date: 01/18/2024   History of Present Illness   Pt is a 89 y/o F admitted on 01/17/24 after presenting with recurrent GLF & new L wrist fx; ortho consulted & LUE splint applied. PMH: ?dementia, moderate aortic stenosis, LBBB, HTN, hypothyroidism, multiple GLFs, recent admission 12/2023 for L1 compression fx     Clinical Impressions Per chart review, in July 2025, pt was completing ADLs Ind. Uncertain if pt was receiving assistance for mobility at baseline. Pt with a history of falls. Pt current demonstrates impaired cognition (suspect near baseline, per chart review), decreased activity tolerance, pain affecting functional level, generalized B UE weakness, decreased B UE coordination, poor insight into deficits and precautions, and decreased safety and independence with functional tasks. Pt currently demonstrating ability to complete ADLs largely with Set up to Total assist +2 and bed mobility with Mod to Max assist +2. Pt participated well in session, but is limited by current cognitive level, pain, and fatigue. Pt will benefit from acute skilled OT services to address deficits and increase safety and independence with ADLs. Post acute discharge, pt will benefit from intensive inpatient skilled rehab services < 3 hours per day to maximize rehab potential.      If plan is discharge home, recommend the following:   A little help with bathing/dressing/bathroom;Two people to help with bathing/dressing/bathroom;Assistance with cooking/housework;Assistance with feeding;Direct supervision/assist for medications management;Direct supervision/assist for financial management;Assist for transportation;Help with stairs or ramp for entrance;Supervision due to cognitive status (Set up and cues for initiation with self-feeding)     Functional Status Assessment   Patient has had a recent decline in their  functional status and demonstrates the ability to make significant improvements in function in a reasonable and predictable amount of time.     Equipment Recommendations   Other (comment) (defer to next level of care)     Recommendations for Other Services         Precautions/Restrictions   Precautions Precautions: Fall;Back Required Braces or Orthoses: Spinal Brace;Other Brace Spinal Brace: Thoracolumbosacral orthotic Other Brace: L UE splint (Pt removing L UE splint frequently due to not remembering why she is wearing it and with no pain in L UE) Restrictions Weight Bearing Restrictions Per Provider Order: No LUE Weight Bearing Per Provider Order:  (maintained NWB LUE throughout session)     Mobility Bed Mobility Overal bed mobility: Needs Assistance Bed Mobility: Rolling, Sidelying to Sit, Sit to Sidelying Rolling: Max assist, Mod assist, +2 for physical assistance, +2 for safety/equipment, Used rails Sidelying to sit: Max assist, +2 for physical assistance, +2 for safety/equipment, HOB elevated, Used rails     Sit to sidelying: Max assist, +2 for physical assistance, +2 for safety/equipment, HOB elevated, Used rails General bed mobility comments: Therapists' providing cuing re: log rolling    Transfers                   General transfer comment: deferred this session per pt request      Balance Overall balance assessment: Needs assistance, History of Falls Sitting-balance support: Feet supported, Bilateral upper extremity supported, Single extremity supported Sitting balance-Leahy Scale: Fair Sitting balance - Comments: supervision sitting EOB                                   ADL either performed or assessed with clinical judgement   ADL  Overall ADL's : Needs assistance/impaired Eating/Feeding: Set up;Supervision/ safety;Bed level (cues to intiate self-feeding; with HOB elevated)   Grooming: Wash/dry hands;Wash/dry  face;Supervision/safety;Set up;Sitting (sitting at EOB; cues for initiation)   Upper Body Bathing: Moderate assistance;Sitting;Cueing for sequencing (cues for initiation and sustained attention; cues for precautions) Upper Body Bathing Details (indicate cue type and reason): simulated at EOB Lower Body Bathing: Maximal assistance;Total assistance;+2 for safety/equipment;Cueing for back precautions;Bed level (cues for initiation and sustained attention; cues for precautions)   Upper Body Dressing : Contact guard assist;Minimal assistance;Sitting;Cueing for sequencing (cues for initiation; cues for precautions)   Lower Body Dressing: Maximal assistance;Total assistance;+2 for safety/equipment;Bed level;Cueing for sequencing (cues for initiation; cues for precautions)     Toilet Transfer Details (indicate cue type and reason): deferred this session Toileting- Clothing Manipulation and Hygiene: Maximal assistance;Total assistance;+2 for safety/equipment;Cueing for sequencing;Cueing for back precautions;Bed level (cues ofr LL UE precautions)         General ADL Comments: Pt with decreased activity tolerance. Current cognition and pain also affecting funcitonal level.     Vision Baseline Vision/History: 1 Wears glasses Ability to See in Adequate Light: 0 Adequate (with glasses) Patient Visual Report: No change from baseline Additional Comments: Vision [facility] for tasks assessed with glasses donned; not formally screened or evaluated     Perception         Praxis         Pertinent Vitals/Pain Pain Assessment Pain Assessment: Faces Faces Pain Scale: Hurts whole lot Pain Location: back (pt reporting no pain in L UE) Pain Descriptors / Indicators: Discomfort, Grimacing Pain Intervention(s): Limited activity within patient's tolerance, Monitored during session, Repositioned     Extremity/Trunk Assessment Upper Extremity Assessment Upper Extremity Assessment: Right hand  dominant;Generalized weakness;RUE deficits/detail;LUE deficits/detail (Pt stating she is Right handed, but often using Left hand first during tasks, including eating and washing her face, with verbal cues needed to shift to use of Right hand) RUE Deficits / Details: generalized weakness; shoulder elevation limited by back pain with movement; all other ROM WFL; decreased coordination RUE Sensation: WNL RUE Coordination: decreased fine motor;decreased gross motor LUE Deficits / Details: new distal radius fx; generalized weakness; shoulder elevation limited by back pain with movement; all other ROM WFL; decreased coordination; noted brusing to hand and wrist; pt reports no pain in L UE and does not remember she broke her wrist even with repeated education LUE Sensation: WNL LUE Coordination: decreased fine motor;decreased gross motor   Lower Extremity Assessment Lower Extremity Assessment: Defer to PT evaluation   Cervical / Trunk Assessment Cervical / Trunk Assessment: Other exceptions (recent L1 compression fx with TLSo brace ordered)   Communication Communication Communication: Impaired Factors Affecting Communication: Hearing impaired   Cognition Arousal: Alert Behavior During Therapy: WFL for tasks assessed/performed Cognition: History of cognitive impairments, No family/caregiver present to determine baseline, Cognition impaired   Orientation impairments: Place, Time, Situation (Pt was suprised to learn she was in the hospital and that she had had another fall. Pt repeatedly asking why she was in the hospital. After being told several times she had had a fall, pt repeatedly asked if she had had another fall.) Awareness: Intellectual awareness impaired, Online awareness impaired Memory impairment (select all impairments): Short-term memory, Working [employer] fast streamer, Conservation officer, historic buildings Attention impairment (select first level of impairment): Selective attention, Sustained attention  (Initally required cues to engage and sustain attention to feeding task but able to attend for several minutes once she ate something she liked (the [place] toast))  Executive functioning impairment (select all impairments): Initiation, Organization, Sequencing, Reasoning, Problem solving OT - Cognition Comments: Pt with history of cognitive impairements per chart review. However, OT is uncertain of pt's baseline cognition.                 Following commands: Impaired Following commands impaired: Follows one step commands with increased time, Follows one step commands inconsistently     Cueing  General Comments   Cueing Techniques: Verbal cues;Gestural cues;Visual cues;Tactile cues      Exercises     Shoulder Instructions      Home Living Family/patient expects to be discharged to:: Assisted living                             Home Equipment: Rolling Walker (2 wheels);Rollator (4 wheels);Cane - single point;Grab bars - tub/shower   Additional Comments: [facility]; all information taken from previous chart entry as pt unable to provide information      Prior Functioning/Environment Prior Level of Function : History of Falls (last six months);Patient poor historian/Family not available             Mobility Comments: Hx of falls, unsure of what AD pt used for ambulation or if she had assistance. ADLs Comments: Per chart review, in July 2025, Pt was Ind with ADLs and received assistance for IADLs, including transportation.    OT Problem List: Decreased strength;Decreased activity tolerance;Impaired balance (sitting and/or standing);Decreased coordination;Decreased safety awareness;Decreased knowledge of use of DME or AE;Decreased knowledge of precautions;Pain;Impaired UE functional use   OT Treatment/Interventions: Self-care/ADL training;DME and/or AE instruction;Therapeutic activities;Patient/family education;Balance training;Cognitive  remediation/compensation      OT Goals(Current goals can be found in the care plan section)   Acute Rehab OT Goals Patient Stated Goal: pt unable to state OT Goal Formulation: Patient unable to participate in goal setting Time For Goal Achievement: 02/01/24 Potential to Achieve Goals: Fair ADL Goals Pt Will Perform Upper Body Bathing: with min assist;sitting Pt Will Perform Lower Body Bathing: with mod assist;sitting/lateral leans;sit to/from stand Pt Will Perform Lower Body Dressing: with mod assist;sitting/lateral leans;sit to/from stand Pt Will Transfer to Toilet: with min assist;ambulating;bedside commode (with least restrictive AD) Pt Will Perform Toileting - Clothing Manipulation and hygiene: with mod assist;sitting/lateral leans;sit to/from stand   OT Frequency:  Min 1X/week    Co-evaluation PT/OT/SLP Co-Evaluation/Treatment: Yes Reason for Co-Treatment: For patient/therapist safety;Necessary to address cognition/behavior during functional activity   OT goals addressed during session: ADL's and self-care;Strengthening/ROM      AM-PAC OT 6 Clicks Daily Activity     Outcome Measure Help from another person eating meals?: A Little Help from another person taking care of personal grooming?: A Little Help from another person toileting, which includes using toliet, bedpan, or urinal?: Total Help from another person bathing (including washing, rinsing, drying)?: A Lot Help from another person to put on and taking off regular upper body clothing?: A Little Help from another person to put on and taking off regular lower body clothing?: Total (Max to Total assist) 6 Click Score: 13   End of Session Equipment Utilized During Treatment: Other (comment) (L UE slpint; however, pt frequently removing due to not remembering what it was for and having no L UE pain) Nurse Communication: Mobility status;Other (comment) (Pt frequently removing L UE splint. Pt reeducated in use of call  bell but shows no signs of learning.)  Activity Tolerance: Patient tolerated treatment well;Patient  limited by pain;Patient limited by fatigue;Other (comment) (Limited by current cognitive status) Patient left: in bed;with call bell/phone within reach;with bed alarm set  OT Visit Diagnosis: Unsteadiness on feet (R26.81);Other abnormalities of gait and mobility (R26.89);Repeated falls (R29.6);Muscle weakness (generalized) (M62.81);History of falling (Z91.81);Other symptoms and signs involving cognitive function;Pain                Time: 1031-1053 OT Time Calculation (min): 22 min Charges:  OT General Charges $OT Visit: 1 Visit OT Evaluation $OT Eval Moderate Complexity: 1 Mod  Margarie Rockey HERO., OTR/L, MA Acute Rehab [phone]   Margarie FORBES Horns 01/18/2024, 3:39 PM

## 2024-01-18 NOTE — Plan of Care (Signed)

## 2024-01-18 NOTE — Evaluation (Signed)
 Physical Therapy Evaluation Patient Details Name: Deanna Olson MRN: [id number] DOB: 05-31-1926 Today's Date: 01/18/2024  History of Present Illness  Pt is a 89 y/o F admitted on 01/17/24 after presenting with recurrent GLF & new L wrist fx; ortho consulted & LUE splint applied. PMH: ?dementia, moderate aortic stenosis, LBBB, HTN, hypothyroidism, multiple GLFs, recent admission 12/2023 for L1 compression fx  Clinical Impression  Pt seen for PT evaluation with co-tx with OT. Pt received in bed, oriented to self, calm, cooperative, follows simple commands inconsistently with extra time & multimodal cuing. Pt does not recall previous falls. Pt requires max assist +2 for bed mobility, tolerates sitting EOB with 1-2 UE support with supervision, washing face while sitting EOB. Pt agreeable to donning TLSO to attempt standing but when brace touched her back she c/o increased pain & declined donning it at this time. Pt would benefit from ongoing PT services to progress mobility as able to reduce fall risk & decrease caregiver burden.      If plan is discharge home, recommend the following: Two people to help with walking and/or transfers;Two people to help with bathing/dressing/bathroom;Assistance with feeding   Can travel by private vehicle   No    Equipment Recommendations Other (comment) (defer to next venue)  Recommendations for Other Services       Functional Status Assessment Patient has had a recent decline in their functional status and demonstrates the ability to make significant improvements in function in a reasonable and predictable amount of time.     Precautions / Restrictions Precautions Precautions: Fall;Back Required Braces or Orthoses: Spinal Brace Spinal Brace: Thoracolumbosacral orthotic Restrictions Weight Bearing Restrictions Per Provider Order: No LUE Weight Bearing Per Provider Order:  (maintained NWB LUE throughout session)      Mobility  Bed Mobility Overal bed  mobility: Needs Assistance Bed Mobility: Rolling, Sidelying to Sit, Sit to Sidelying Rolling: Max assist, Mod assist, +2 for physical assistance, +2 for safety/equipment, Used rails Sidelying to sit: Max assist, +2 for physical assistance, +2 for safety/equipment, HOB elevated, Used rails     Sit to sidelying: Max assist, +2 for physical assistance, +2 for safety/equipment, HOB elevated, Used rails General bed mobility comments: Therapists' providing cuing re: log rolling    Transfers                        Ambulation/Gait                  Stairs            Wheelchair Mobility     Tilt Bed    Modified Rankin (Stroke Patients Only)       Balance Overall balance assessment: Needs assistance, History of Falls Sitting-balance support: Feet supported, Bilateral upper extremity supported, Single extremity supported Sitting balance-Leahy Scale: Fair Sitting balance - Comments: supervision sitting EOB                                     Pertinent Vitals/Pain Pain Assessment Pain Assessment: Faces Faces Pain Scale: Hurts whole lot Pain Location: back Pain Descriptors / Indicators: Discomfort, Grimacing Pain Intervention(s): Monitored during session, Limited activity within patient's tolerance, Repositioned    Home Living Family/patient expects to be discharged to:: Assisted living                 Home Equipment: Agricultural consultant (2 wheels);Rollator (4 wheels);Cane -  single point;Grab bars - tub/shower Additional Comments: [facility]; all information taken from previous chart entry    Prior Function Prior Level of Function : History of Falls (last six months);Patient poor historian/Family not available             Mobility Comments: Hx of falls, unsure of what AD pt used for ambulation or if she had assistance.       Extremity/Trunk Assessment   Upper Extremity Assessment Upper Extremity Assessment: Generalized  weakness (BUE shoulder elevation limited by back pain with movement)    Lower Extremity Assessment Lower Extremity Assessment: Generalized weakness (3-/5 knee extension in sitting)       Communication   Communication Communication: Impaired Factors Affecting Communication: Hearing impaired    Cognition Arousal: Alert Behavior During Therapy: WFL for tasks assessed/performed   PT - Cognitive impairments: History of cognitive impairments, Orientation, Memory, Awareness, Attention, Initiation, Sequencing, Problem solving, Safety/Judgement   Orientation impairments: Place, Time, Situation                   PT - Cognition Comments: repeatedly asks throughout session You mean I fell again?, does not recall falls Following commands: Impaired Following commands impaired: Follows one step commands with increased time, Follows one step commands inconsistently     Cueing Cueing Techniques: Verbal cues, Gestural cues, Visual cues, Tactile cues     General Comments      Exercises     Assessment/Plan    PT Assessment Patient needs continued PT services  PT Problem List Decreased strength;Pain;Decreased range of motion;Decreased cognition;Decreased activity tolerance;Decreased knowledge of use of DME;Decreased balance;Decreased safety awareness;Decreased mobility;Decreased knowledge of precautions       PT Treatment Interventions DME instruction;Balance training;Gait training;Neuromuscular re-education;Functional mobility training;Therapeutic activities;Therapeutic exercise;Cognitive remediation;Patient/family education;Wheelchair mobility training;Manual techniques    PT Goals (Current goals can be found in the Care Plan section)  Acute Rehab PT Goals Patient Stated Goal: none stated PT Goal Formulation: Patient unable to participate in goal setting Time For Goal Achievement: 02/01/24 Potential to Achieve Goals: Fair    Frequency Min 2X/week     Co-evaluation  PT/OT/SLP Co-Evaluation/Treatment: Yes Reason for Co-Treatment: For patient/therapist safety;Necessary to address cognition/behavior during functional activity PT goals addressed during session: Mobility/safety with mobility;Balance         AM-PAC PT 6 Clicks Mobility  Outcome Measure Help needed turning from your back to your side while in a flat bed without using bedrails?: Total Help needed moving from lying on your back to sitting on the side of a flat bed without using bedrails?: Total Help needed moving to and from a bed to a chair (including a wheelchair)?: Total Help needed standing up from a chair using your arms (e.g., wheelchair or bedside chair)?: Total Help needed to walk in hospital room?: Total Help needed climbing 3-5 steps with a railing? : Total 6 Click Score: 6    End of Session   Activity Tolerance: Patient limited by fatigue Patient left: in bed;with call bell/phone within reach;with bed alarm set (set up with meal tray) Nurse Communication: Mobility status PT Visit Diagnosis: History of falling (Z91.81);Unsteadiness on feet (R26.81);Other abnormalities of gait and mobility (R26.89);Muscle weakness (generalized) (M62.81);Difficulty in walking, not elsewhere classified (R26.2);Pain Pain - part of body:  (back)    Time: 8968-8948 PT Time Calculation (min) (ACUTE ONLY): 20 min   Charges:   PT Evaluation $PT Eval Moderate Complexity: 1 Mod   PT General Charges $$ ACUTE PT VISIT: 1 Visit  Richerd Pinal, PT, DPT 01/18/24, 11:08 AM   Richerd CHRISTELLA Pinal 01/18/2024, 11:06 AM

## 2024-01-18 NOTE — Progress Notes (Signed)
 Pt refuses to wear left arm splint. She keeps pulling it out. Education is provided. We will hand off to the day shift team.   Wendi Dash, RN

## 2024-01-19 DIAGNOSIS — I350 Nonrheumatic aortic (valve) stenosis: Secondary | ICD-10-CM | POA: Diagnosis not present

## 2024-01-19 NOTE — TOC Progression Note (Signed)
 Transition of Care [facility]) - Progression Note    Patient Details  Name: Deanna Olson MRN: [id number] Date of Birth: January 29, 1926  Transition of Care [facility]) CM/SW Contact  Gwenn Frieze [place], [place] Phone Number: 01/19/2024, 12:36 PM  Clinical Narrative:  Pt for dc back to [facility] with hospice services. Pt's dtr aware of dc plan and requesting hospice services through [facility] ([facility]). Spoke to [facility] at [facility] [phone] who confirmed pt is able to return with hospice services today pending DME delivery. Referral made to Amy with [facility] who is working to have DME delivered. MD updated.   Frieze Gwenn, MSW, LCSW [phone] (coverage)      Expected Discharge Plan: Assisted Living ([facility] with hospice) Barriers to Discharge: Other (must enter comment) (hospice acceptance and DME)               Expected Discharge Plan and Services In-house Referral: Clinical Social Work   Post Acute Care Choice: Resumption of Svcs/PTA Provider ([facility]) Living arrangements for the past 2 months: Assisted Living Facility                                       Social Drivers of Health (SDOH) Interventions SDOH Screenings   Food Insecurity: Patient Unable To Answer (01/17/2024)  Housing: Unknown (01/17/2024)  Transportation Needs: Patient Unable To Answer (01/17/2024)  Utilities: Patient Unable To Answer (01/17/2024)  Alcohol Screen: Low Risk  (02/13/2021)  Depression (PHQ2-9): Medium Risk (07/11/2023)  Financial Resource Strain: Low Risk  (12/24/2022)  Physical Activity: Inactive (12/24/2022)  Social Connections: Unknown (01/17/2024)  Stress: No Stress Concern Present (12/24/2022)  Tobacco Use: Low Risk  (01/17/2024)  Health Literacy: Adequate Health Literacy (12/24/2022)    Readmission Risk Interventions     No data to display

## 2024-01-19 NOTE — Progress Notes (Signed)
 [facility] [phone] [facility] Liaison RN note  Received request from [facility] Josie Barnes for hospice services at her ALF after discharge. Hospice eligibility has been confirmed.   Spoke with daughter Macario to initiate education related to hospice philosophy, services, and team approach to care. Macario verbalized understanding of information given. Per discussion, the plan is for discharge home by either private vehicle or PTAR today 8/07  DME needs discussed. Patient has the following equipment in the home: walker. Family requests the following equipment for delivery: hospital bed, wheelchair and fall floor mat. Address has been verified and is correct in the chart. Macario at [phone] is the family contact to arrange time of equipment delivery.   Please send signed and completed DNR with patient/family. Please provide prescriptions at discharge as needed for ongoing symptom management.   [facility] information and contact numbers given to [employer]. Above information shared with Josie. Please call with any hospice related questions or concerns.   Thank you for the opportunity to participate in this patient's care.  A[facility] Liaison [phone]

## 2024-01-19 NOTE — Progress Notes (Signed)
 [facility] Progress Note Patient: Deanna Olson FMW:[id number] DOB: June 25, 1925 DOA: 01/17/2024  DOS: the patient was seen and examined on 01/19/2024  Brief Hospital Course: Patient with PMH of dementia, moderately severe aortic stenosis, HTN, hypothyroidism, recurrent ground-level falls present to the hospital with complaints of another fall. Patient has prior history of recurrent fall at home. Due to which family brought the patient to ALF at [facility]. Patient continues to have frequent fall at the facility. Was recently hospitalized for a fall leading to L1 compression fracture on 12/16/2023. Was discharged to rehab.  From there patient was discharged back to [facility] and was receiving physical therapy. Lately she has not been wearing her TLSO brace. She is also complaining of worsening pain and therefore has been refusing to walk and currently being pushed around in a wheelchair. On 8/5 early morning, patient attempted to walk out of her bed and sustained a fall with swelling of her left wrist. Patient is unable to remember the fall.  Patient has severe aortic stenosis as well as need for multiple psychotropic medications for symptom control.  With history of recurrent fall.  Family understands poor prognosis and currently transitioning to hospice upon discharge.  Assessment and Plan: Recurrent fall. It appears that the patient has progressively worsening balance. She is on medicines like trazodone , Xanax  as well as gabapentin  which per family is necessary for symptom control. She appears to have severe aortic stenosis diagnosed in 2023 and not a candidate for any aggressive intervention. Suspect that the fall is combination of polypharmacy, dementia, deconditioning as well as severe aortic stenosis. Given the frequency, expect that it will continue to get worse as the time progresses. PT OT was consulted and recommendation was for SNF although after prolonged discussion with the  family, given lack of definitive treatment to reduce the fall frequency, family has decided to transition to focus or care on comfort.  Recommended considering hospice at [facility] and family is currently agreeable to it for now.  Moderate to severe aortic stenosis. Echocardiogram 2023 shows EF 60 to 65%. Aortic valve area by VTI 0.99 meeting criteria for severe aortic stenosis. V-max is 3.09. Patient was deemed an not a good candidate and did not want any acute intervention for the valve and therefore no further workup was recommended by cardiology. At present appears that the patient is presenting with symptomatic aortic stenosis which renders poor prognosis without a mechanical repair. Family decided transition to comfort.  Monitor.  L1 compression fracture. Prior history of lumbar compression fractures. Recommended TLSO brace although patient is not wearing it. Neurosurgery was consulted during last admission. Given her tendency for frequent fall, not agreed for any aggressive intervention. No focal deficit right now.  Nondisplaced distal radius fracture on the left. Splint recommended as well as sling although patient currently noncompliant. Continue pain control.  Dementia. Progressively worsening per family. Patient has been increasingly forgetful. This is leading to increasing anxiety as well Per family patient does not do well without her Xanax .  Which I will continue for now. Also on trazodone  at nighttime which I will continue.  Chronic neuropathy. On gabapentin . Will continue the same at home dose. Placing the patient at a high risk for recurrent fall.  Hypothyroidism. Continue Synthroid .  HTN. Blood pressure is elevated. Resume losartan . Less likely orthostatic  HLD. For now continue statin. Although low threshold to discontinue this medication.  Overactive bladder. On Myrbetriq .  Continue.  Goals of care conversation. Patient is DNR/DNI at the  time of  the admission. Appears to have progressive aortic stenosis which was most likely causing symptomatic syncope. Also has risk of polypharmacy but would not do well with her anxiety without Xanax  and would not do well with her neuropathy with burning pain without gabapentin . Has been noncompliant with splint here in the hospital for a wrist fracture as well as TLSO brace at the facility. At present family understands patient's current status and would like to focus more on comfort approach rather than aggressive intervention against her will which would not make any significant difference to the aortic stenosis for which the patient is not a candidate for intervention. Current goal is to transition to comfort with hospice on discharge [facility]. Await arrangement of DME/[facility].    Subjective: Denies any acute complaint.  No nausea no vomiting.  So far no passing out events here in the hospital.  Physical Exam: Aortic systolic murmur heard. S1-S2 present  bowel sound present.  Data Reviewed: I have Reviewed nursing notes, Vitals, and Lab results.  Disposition: Status is: Observation Place and maintain sequential compression device Start: 01/17/24 1436   Family Communication: Discussed with family on 8/6. Level of care: Med-Surg   Vitals:   01/18/24 0416 01/18/24 0451 01/18/24 1600 01/19/24 0429  BP: 129/67 (!) 131/96 (!) 163/96 (!) 100/52  Pulse: 76 (!) 109 76 66  Resp: 18 18 16 16   Temp: 98.2 F (36.8 C) 97.8 F (36.6 C) 97.7 F (36.5 C) 98 F (36.7 C)  TempSrc: Oral  Oral   SpO2: 97% 93% 96% 95%     Author: Yetta Blanch, MD 01/19/2024 4:49 PM  Please look on [email or web address] to find out who is on call.

## 2024-01-20 DIAGNOSIS — R531 Weakness: Secondary | ICD-10-CM | POA: Diagnosis not present

## 2024-01-20 DIAGNOSIS — I350 Nonrheumatic aortic (valve) stenosis: Secondary | ICD-10-CM | POA: Diagnosis not present

## 2024-01-20 DIAGNOSIS — R410 Disorientation, unspecified: Secondary | ICD-10-CM | POA: Diagnosis not present

## 2024-01-20 DIAGNOSIS — Z7401 Bed confinement status: Secondary | ICD-10-CM | POA: Diagnosis not present

## 2024-01-20 DIAGNOSIS — I959 Hypotension, unspecified: Secondary | ICD-10-CM | POA: Diagnosis not present

## 2024-01-20 DIAGNOSIS — R0689 Other abnormalities of breathing: Secondary | ICD-10-CM | POA: Diagnosis not present

## 2024-01-20 MED ORDER — ALPRAZOLAM 0.5 MG PO TABS: 0.5000 mg | ORAL_TABLET | Freq: Two times a day (BID) | ORAL | 0 refills | Status: AC

## 2024-01-20 MED ORDER — OXYCODONE HCL 5 MG PO TABS: 5.0000 mg | ORAL_TABLET | ORAL | 0 refills | Status: AC | PRN

## 2024-01-20 NOTE — NC FL2 (Signed)
 [place]  MEDICAID FL2 LEVEL OF CARE FORM     IDENTIFICATION  Patient Name: Deanna Olson Birthdate: Apr 07, 1926 Sex: female Admission Date (Current Location): 01/17/2024  [facility] and [place] Number:  Producer, television/film/video and Address:  [facility], [address], [place], [place] [address]      Provider Number: [id number]  Attending Physician Name and Address:  Tobie Yetta HERO, MD  Relative Name and Phone Number:  Annabelle Helling Daughter [phone]    Current Level of Care: Hospital Recommended Level of Care: Assisted Living Facility ([facility]) Prior Approval Number:    Date Approved/Denied:   PASRR Number:    Discharge Plan: Other (Comment) ([facility])    Current Diagnoses: Patient Active Problem List   Diagnosis Date Noted   Recurrent falls 01/18/2024   Closed fracture of left distal radius 01/18/2024   Fall 01/17/2024   Lumbar compression fracture (HCC) 12/16/2023   COVID 06/13/2023   Elevated CK 04/28/2023   Alzheimer disease (HCC) 02/17/2023   OAB (overactive bladder) 02/17/2023   LBBB (left bundle branch block) 02/28/2022   Bilateral hearing loss 08/15/2020   Neuropathy 12/27/2019   Osteoporosis 02/20/2018   Cough 02/20/2018   Rash 01/27/2017   Low back pain 07/06/2016   Right knee pain 07/06/2016   Chronic kidney disease, stage 3a (HCC) 08/13/2014   Routine general medical examination at a health care facility 08/13/2014   Vitamin D  deficiency 12/02/2009   Moderate to severe aortic stenosis 01/24/2008   DEGENERATIVE JOINT DISEASE 11/05/2007   Hypothyroidism 10/31/2007   Essential hypertension 10/31/2007   Allergic rhinitis 10/31/2007   Mixed hyperlipidemia 04/12/2007   Anxiety state 04/12/2007   GERD without esophagitis 04/12/2007    Orientation RESPIRATION BLADDER Height & Weight     Self  Normal Incontinent Weight:   Height:     BEHAVIORAL SYMPTOMS/MOOD NEUROLOGICAL BOWEL NUTRITION STATUS       Incontinent Diet (regular diet)  AMBULATORY STATUS COMMUNICATION OF NEEDS Skin   Total Care Verbally Other (Comment) (ecchymosis, redness)                       Personal Care Assistance Level of Assistance  Bathing, Feeding, Dressing Bathing Assistance: Maximum assistance Feeding assistance: Limited assistance Dressing Assistance: Maximum assistance     Functional Limitations Info  Sight, Hearing, Speech Sight Info: Adequate Hearing Info: Impaired Speech Info: Adequate    SPECIAL CARE FACTORS FREQUENCY   (Hospice to follow at ALF)                    Contractures Contractures Info: Not present    Additional Factors Info  Code Status, Allergies Code Status Info: DNR Allergies Info: Sulfonamide Derivatives           Current Medications (01/20/2024):  This is the current hospital active medication list Current Facility-Administered Medications  Medication Dose Route Frequency Provider Last Rate Last Admin   acetaminophen  (TYLENOL ) tablet 500 mg  500 mg Oral Q4H PRN Patel, Pranav M, MD       acetaminophen  (TYLENOL ) tablet 500 mg  500 mg Oral Q8H Patel, Pranav M, MD   500 mg at 01/20/24 9376   ALPRAZolam  (XANAX ) tablet 0.5 mg  0.5 mg Oral BID Patel, Pranav M, MD   0.5 mg at 01/19/24 2138   diclofenac  Sodium (VOLTAREN ) 1 % topical gel 2 g  2 g Topical QID Georgina Basket, MD   2 g at 01/19/24 2138  gabapentin  (NEURONTIN ) capsule 100 mg  100 mg Oral q morning Patel, Pranav M, MD   100 mg at 01/19/24 9062   gabapentin  (NEURONTIN ) capsule 300 mg  300 mg Oral QHS Patel, Pranav M, MD   300 mg at 01/19/24 2137   levothyroxine  (SYNTHROID ) tablet 50 mcg  50 mcg Oral QAC breakfast Georgina Basket, MD   50 mcg at 01/20/24 9376   lidocaine  (LIDODERM ) 5 % 1 patch  1 patch Transdermal Daily Reome, Earle J, RPH   1 patch at 01/19/24 9063   losartan  (COZAAR ) tablet 50 mg  50 mg Oral Daily Georgina Basket, MD   50 mg at 01/19/24 9062   nystatin  (MYCOSTATIN /NYSTOP ) topical powder   Topical  BID Georgina Basket, MD   Given at 01/19/24 2138   oxyCODONE  (Oxy IR/ROXICODONE ) immediate release tablet 5 mg  5 mg Oral Q4H PRN Patel, Pranav M, MD       pantoprazole  (PROTONIX ) EC tablet 40 mg  40 mg Oral Daily Georgina Basket, MD   40 mg at 01/19/24 9062   traZODone  (DESYREL ) tablet 50 mg  50 mg Oral QHS Patel, Pranav M, MD   50 mg at 01/19/24 2138     Discharge Medications: Please see discharge summary for a list of discharge medications.  Relevant Imaging Results:  Relevant Lab Results:   Additional Information Civil engineer, contracting to follow for hospice services at ALF; Please see discharge summary for medications  Kahley Leib Elizabeth, LCSW

## 2024-01-20 NOTE — Progress Notes (Signed)
 Deanna Olson to be D/C'd assisted living facility per MD order.  Discussed with the patient and all questions fully answered.  An After Visit Summary was printed and given to PTAR for receiving facility. Patient prescriptions and signed DNR form also sent.  Report called to [name], resident care Interior and spatial designer at [facility].  Patient instructed to return to ED, call 911, or call MD for any changes in condition.   Patient escorted via stretcher, and D/C to [facility] via non emergency ambulance.  [name] [name] 01/20/2024 11:13 AM

## 2024-01-20 NOTE — TOC Transition Note (Signed)
 Transition of Care [facility]) - Discharge Note   Patient Details  Name: Deanna Olson MRN: [id number] Date of Birth: [birth year]  [facility] CM/SW Contact:  Gwenn Julien [place], [place] Phone Number: 01/20/2024, 9:50 AM   Clinical Narrative:  Pt for dc back to [facility] today. Spoke to Almond in admissions who confirmed DME in place and they are prepared to admit. [facility] to f/u at [employer] [facility] for hospice services. Pt's dtr Macario aware of dc and reports agreeable. RN provided with number for report and PTAR arranged for transport. SW signing off at dc.   Julien Gwenn, MSW, LCSW [phone] (coverage)      Final next level of care: Skilled Nursing Facility Barriers to Discharge: Barriers Resolved   Patient Goals and CMS Choice     Choice offered to / list presented to : Adult Children (all three daughters-Lynn, Laura, Luann)      Discharge Placement                Patient to be transferred to facility by: PTAR Name of family member notified: Lynn/dtr Patient and family notified of of transfer: 01/20/24  Discharge Plan and Services Additional resources added to the After Visit Summary for   In-house Referral: Clinical Social Work   Post Acute Care Choice: Resumption of Svcs/PTA Provider ([facility])                               Social Drivers of Health (SDOH) Interventions SDOH Screenings   Food Insecurity: Patient Unable To Answer (01/17/2024)  Housing: Unknown (01/17/2024)  Transportation Needs: Patient Unable To Answer (01/17/2024)  Utilities: Patient Unable To Answer (01/17/2024)  Alcohol Screen: Low Risk  (02/13/2021)  Depression (PHQ2-9): Medium Risk (07/11/2023)  Financial Resource Strain: Low Risk  (12/24/2022)  Physical Activity: Inactive (12/24/2022)  Social Connections: Unknown (01/17/2024)  Stress: No Stress Concern Present (12/24/2022)  Tobacco Use: Low Risk  (01/17/2024)  Health Literacy: Adequate Health Literacy  (12/24/2022)     Readmission Risk Interventions     No data to display

## 2024-01-20 NOTE — Discharge Summary (Signed)
 Physician Discharge Summary   Patient: Deanna Olson MRN: [id number] DOB: Apr 16, 1926  Admit date:     01/17/2024  Discharge date: 01/20/24  Discharge Physician: Yetta Blanch  PCP: Patient, No Pcp Per  Recommendations at discharge:  Establish care with hospice.    Follow-up Information     PCP. Schedule an appointment as soon as possible for a visit.   Why: As needed        [facility] Follow up.   Specialty: Hospice and Palliative Medicine Why: To Establish Care Contact information: [address] [place]  [address] [phone]               Discharge Diagnoses: Principal Problem:   Moderate to severe aortic stenosis Active Problems:   Chronic kidney disease, stage 3a (HCC)   Essential hypertension   Hypothyroidism   Anxiety state   Neuropathy   Alzheimer disease (HCC)   Lumbar compression fracture (HCC)   Fall   Recurrent falls   Closed fracture of left distal radius  Hospital Course: Patient with PMH of dementia, moderately severe aortic stenosis, HTN, hypothyroidism, recurrent ground-level falls present to the hospital with complaints of another fall. Patient has prior history of recurrent fall at home. Due to which family brought the patient to ALF at [facility]. Patient continues to have frequent fall at the facility. Was recently hospitalized for a fall leading to L1 compression fracture on 12/16/2023. Was discharged to rehab.  From there patient was discharged back to [facility] and was receiving physical therapy. Lately she has not been wearing her TLSO brace. She is also complaining of worsening pain and therefore has been refusing to walk and currently being pushed around in a wheelchair. On 8/5 early morning, patient attempted to walk out of her bed and sustained a fall with swelling of her left wrist. Patient is unable to remember the fall.  Patient has severe aortic stenosis as well as need for multiple psychotropic  medications for symptom control.  With history of recurrent fall.  Family understands poor prognosis and currently transitioning to hospice upon discharge.  Assessment and Plan: Recurrent fall. It appears that the patient has progressively worsening balance. She is on medicines like trazodone , Xanax  as well as gabapentin  which per family is necessary for symptom control. She appears to have severe aortic stenosis diagnosed in 2023 and not a candidate for any aggressive intervention. Suspect that the fall is combination of polypharmacy, dementia, deconditioning as well as severe aortic stenosis. Given the frequency, expect that it will continue to get worse as the time progresses. PT OT was consulted and recommendation was for SNF although after prolonged discussion with the family, given lack of definitive treatment to reduce the fall frequency, family has decided to transition to focus or care on comfort.  Recommended considering hospice at [facility] and family is currently agreeable to it for now.  Moderate to severe aortic stenosis. Echocardiogram 2023 shows EF 60 to 65%. Aortic valve area by VTI 0.99 meeting criteria for severe aortic stenosis. V-max is 3.09. Patient was deemed an not a good candidate and did not want any acute intervention for the valve and therefore no further workup was recommended by cardiology. At present appears that the patient is presenting with symptomatic aortic stenosis which renders poor prognosis without a mechanical repair. Family decided transition to comfort.  Monitor.  L1 compression fracture. Prior history of lumbar compression fractures. Recommended TLSO brace although patient is not wearing it. Neurosurgery was consulted  during last admission. Given her tendency for frequent fall, not agreed for any aggressive intervention. No focal deficit right now.  Nondisplaced distal radius fracture on the left. Splint recommended as well as sling although  patient currently noncompliant. Continue pain control.  Dementia. Progressively worsening per family. Patient has been increasingly forgetful. This is leading to increasing anxiety as well Per family patient does not do well without her Xanax .  Which I will continue for now. Also on trazodone  at nighttime which I will continue.  Chronic neuropathy. On gabapentin . Will continue the same at home dose. Placing the patient at a high risk for recurrent fall.  Hypothyroidism. Continue Synthroid .  HTN. Blood pressure is elevated. Resume losartan . Less likely orthostatic  HLD. For now continue statin. Although low threshold to discontinue this medication.  Overactive bladder. On Myrbetriq .  Continue.  Goals of care conversation. Patient is DNR/DNI at the time of the admission. Appears to have progressive aortic stenosis which was most likely causing symptomatic syncope. Also has risk of polypharmacy but would not do well with her anxiety without Xanax  and would not do well with her neuropathy with burning pain without gabapentin . Has been noncompliant with splint here in the hospital for a wrist fracture as well as TLSO brace at the facility. At present family understands patient's current status and would like to focus more on comfort approach rather than aggressive intervention against her will which would not make any significant difference to the aortic stenosis for which the patient is not a candidate for intervention. Current goal is to transition to comfort with hospice on discharge [facility]. Await arrangement of DME/[facility].   Pain control - [place]  Controlled Substance Reporting System database was reviewed. and patient was instructed, not to drive, operate heavy machinery, perform activities at heights, swimming or participation in water activities or provide baby-sitting services while on Pain, Sleep and Anxiety Medications; until their outpatient Physician  has advised to do so again. Also recommended to not to take more than prescribed Pain, Sleep and Anxiety Medications.  Consultants:  none   Procedures performed:  none  DISCHARGE MEDICATION: Allergies as of 01/20/2024       Reactions   Sulfonamide Derivatives Swelling, Other (See Comments)   Angioedema         Medication List     STOP taking these medications    furosemide  20 MG tablet Commonly known as: LASIX    pravastatin  20 MG tablet Commonly known as: PRAVACHOL        TAKE these medications    acetaminophen  325 MG tablet Commonly known as: TYLENOL  Take 650 mg by mouth in the morning and at bedtime.   albuterol  108 (90 Base) MCG/ACT inhaler Commonly known as: VENTOLIN  HFA Inhale 2 puffs into the lungs every 6 (six) hours as needed for wheezing or shortness of breath.   ALPRAZolam  0.5 MG tablet Commonly known as: XANAX  Take 1 tablet (0.5 mg total) by mouth 2 (two) times daily.   betamethasone  dipropionate 0.05 % lotion APPLY TOPICALLY DAILY AS NEEDED USES ON SCALP AND EARS What changed:  how much to take reasons to take this additional instructions   diclofenac  Sodium 1 % Gel Commonly known as: VOLTAREN  Apply 2-4 g topically See admin instructions. Apply 2 grams to shoulders and back 3 times daily and apply 4 grams to legs 3 times daily.   gabapentin  300 MG capsule Commonly known as: NEURONTIN  TAKE 1 CAPSULE BY MOUTH EVERY NIGHT AT BEDTIME   gabapentin  100  MG capsule Commonly known as: NEURONTIN  TAKE 1 CAPSULE BY MOUTH EVERY MORNING   guaiFENesin -dextromethorphan 100-10 MG/5ML syrup Commonly known as: ROBITUSSIN DM Take 5 mLs by mouth every 4 (four) hours as needed for cough.   ketoconazole  2 % cream Commonly known as: NIZORAL  APPLY ONE APPLICATION TOPICALLY DAILY What changed: See the new instructions.   levothyroxine  50 MCG tablet Commonly known as: SYNTHROID  TAKE 1 TABLET BY MOUTH DAILY BEFORE BREAKFAST   lidocaine  4 % Place 1 patch onto  the skin daily. Apply at 0800, remove at 2000.   losartan  50 MG tablet Commonly known as: COZAAR  TAKE 1 TABLET BY MOUTH DAILY   melatonin 3 MG Tabs tablet Take 6 mg by mouth at bedtime.   Myrbetriq  50 MG Tb24 tablet Generic drug: mirabegron  ER TAKE 1 TABLET BY MOUTH DAILY **NEED APPOINTMENT FOR REFILLS**   omeprazole  20 MG capsule Commonly known as: PRILOSEC TAKE 1 CAPSULE BY MOUTH DAILY   oxyCODONE  5 MG immediate release tablet Commonly known as: Oxy IR/ROXICODONE  Take 1 tablet (5 mg total) by mouth every 4 (four) hours as needed for moderate pain (pain score 4-6) or severe pain (pain score 7-10). What changed: reasons to take this   Pain Relief PM Extra Strength 500-25 MG Tabs tablet Generic drug: diphenhydramine-acetaminophen  Take 1 tablet by mouth at bedtime as needed (sleep).   PreserVision AREDS 2 Caps Take 1 capsule by mouth in the morning and at bedtime.   SYSTANE ULTRA OP Place 1 drop into both eyes in the morning and at bedtime.   traZODone  50 MG tablet Commonly known as: DESYREL  Take 50 mg by mouth at bedtime.   Vitamin D3 10 MCG (400 UNIT) tablet Take 400 Units by mouth daily.   XYLIMELTS MT Take 1 tablet by mouth at bedtime.       Disposition: ALF/ILF with hospice Diet recommendation: Regular diet  Discharge Exam: Vitals:   01/18/24 0451 01/18/24 1600 01/19/24 0429 01/20/24 0500  BP: (!) 131/96 (!) 163/96 (!) 100/52 (!) 116/51  Pulse: (!) 109 76 66 71  Resp: 18 16 16 16   Temp: 97.8 F (36.6 C) 97.7 F (36.5 C) 98 F (36.7 C) 98.5 F (36.9 C)  TempSrc:  Oral  Oral  SpO2: 93% 96% 95%    Aortic systolic murmur present.  No abdominal pain  Clear to auscultation   Condition at discharge: stable  The results of significant diagnostics from this hospitalization (including imaging, microbiology, ancillary and laboratory) are listed below for reference.   Imaging Studies: CT Cervical Spine Wo Contrast Result Date: 01/17/2024 EXAM: CT CERVICAL  SPINE WITHOUT CONTRAST 01/17/2024 09:19:08 AM TECHNIQUE: CT of the cervical spine was performed without the administration of intravenous contrast. Multiplanar reformatted images are provided for review. Automated exposure control, iterative reconstruction, and/or weight based adjustment of the mA/kV was utilized to reduce the radiation dose to as low as reasonably achievable. COMPARISON: CT of the cervical spine dated 06/12/2023. CLINICAL HISTORY: Neck trauma (Age >= 67y). She has history of hypertension, hyperlipidemia and comes in following a fall at home. She states that she got out of bed and that her legs gave out on her. She is complaining of pain pretty much everywhere. She denies loss of consciousness. FINDINGS: CERVICAL SPINE: BONES AND ALIGNMENT: There is a mild dextrocurvature of the cervicothoracic spine. There is slight degenerative anterolisthesis at C4-5. DEGENERATIVE CHANGES: There is moderate chronic degenerative disc disease at C5-6 and C6-7, with posterior endplate ridging in both levels causing mild central  spinal canal stenosis at both levels. There is fusion of the right facets at C2-3. There is diffuse bilateral facet arthrosis present throughout the remainder of the cervical spine, which is worse on the patient's left. SOFT TISSUES: No prevertebral soft tissue swelling. VASCULATURE: There is moderate calcific atheromatous disease within the carotid bulbs bilaterally. IMPRESSION: 1. No acute abnormality of the cervical spine related to the reported neck trauma. 2. Moderate chronic degenerative disc disease at C5-6 and C6-7 with mild central spinal canal stenosis at both levels. Electronically signed by: evalene coho 01/17/2024 10:35 AM EDT RP Workstation: [id number]   CT CHEST ABDOMEN PELVIS W CONTRAST Result Date: 01/17/2024 EXAM: CT CHEST, ABDOMEN AND PELVIS WITH CONTRAST 01/17/2024 09:19:08 AM TECHNIQUE: CT of the chest, abdomen and pelvis was performed with the administration of  intravenous contrast. Multiplanar reformatted images are provided for review. Automated exposure control, iterative reconstruction, and/or weight based adjustment of the mA/kV was utilized to reduce the radiation dose to as low as reasonably achievable. COMPARISON: CT of the pelvis dated 12/16/2023 and CT of the chest dated 04/28/2022. CLINICAL HISTORY: Polytrauma, blunt. She has history of hypertension, hyperlipidemia and comes in following a fall at home. She states that she got out of bed and that her legs gave out on her. She is complaining of pain pretty much everywhere. She denies loss of consciousness. FINDINGS: CHEST: MEDIASTINUM: Heart and pericardium are unremarkable. The central airways are clear. There is moderate calcific atheromatous disease at the aortic root and there is mild calcific coronary artery disease. THORACIC LYMPH NODES: No mediastinal, hilar or axillary lymphadenopathy. LUNGS AND PLEURA: There are ground-glass and reticular opacities present dependently within the lower lobes bilaterally. No pleural effusion or pneumothorax. ABDOMEN AND PELVIS: LIVER: The liver is unremarkable. GALLBLADDER AND BILE DUCTS: There is a calcified stone present within the gallbladder. No biliary ductal dilatation. SPLEEN: No acute abnormality. PANCREAS: No acute abnormality. ADRENAL GLANDS: No acute abnormality. KIDNEYS, URETERS AND BLADDER: The kidneys are atrophic and there are bilateral simple renal cysts including an exophytic cyst arising from the superior pole of the left kidney which measures nearly 7 cm in diameter. No stones in the ureters. No hydronephrosis. No perinephric or periureteral stranding. Urinary bladder is unremarkable. GI AND BOWEL: Stomach demonstrates no acute abnormality. There is no bowel obstruction. REPRODUCTIVE ORGANS: The patient is status post hysterectomy. PERITONEUM AND RETROPERITONEUM: No ascites. No free air. VASCULATURE: The thoracic aorta demonstrates moderate calcific  atheromatous disease. The descending thoracic aorta is tortuous. Aorta is normal in caliber. ABDOMINAL AND PELVIS LYMPH NODES: No lymphadenopathy. BONES AND SOFT TISSUES: There is mild levoscoliosis of the lumbar spine. There are moderate compression deformities of L1, L3 and L4. The L3 and L4 vertebral bodies are status post augmentation. There are edematous changes surrounding the L1 vertebral body, suggesting the fracture may be acute or recent or acute on chronic. The bony pelvis is intact. No focal soft tissue abnormality. IMPRESSION: 1. Moderate compression deformities of L1, L3, and L4, with edematous changes surrounding the L1 vertebral body suggesting the fracture may be acute or recent or acute on chronic. L3 and L4 vertebral bodies are status post augmentation. Electronically signed by: evalene coho 01/17/2024 10:32 AM EDT RP Workstation: [id number]   CT Head Wo Contrast Result Date: 01/17/2024 EXAM: CT HEAD WITHOUT 01/17/2024 09:19:08 AM TECHNIQUE: CT of the head was performed without the administration of intravenous contrast. Automated exposure control, iterative reconstruction, and/or weight based adjustment of the mA/kV was utilized to reduce the  radiation dose to as low as reasonably achievable. COMPARISON: CT of the head dated 06/11/2001. CLINICAL HISTORY: Head trauma, minor (Age >= 67y). She has history of hypertension, hyperlipidemia and comes in following a fall at home. She states that she got out of bed and that her legs gave out on her. She is complaining of pain pretty much everywhere. She denies loss of consciousness. FINDINGS: BRAIN AND VENTRICLES: No acute intracranial hemorrhage. No mass effect or midline shift. No extra-axial fluid collection. Gray-white differentiation is maintained. No hydrocephalus. There are dystrophic calcifications within the right posterior frontal lobe, as before. There is age-related cerebral and cerebellar volume loss and there is moderately advanced  cerebral white matter disease. ORBITS: The patient is status post bilateral lens replacement. SINUSES AND MASTOIDS: There is mild mucosal disease within the sphenoid sinuses. SOFT TISSUES AND SKULL: No acute skull fracture. No acute soft tissue abnormality. There are extensive vascular calcifications within the carotid siphons. IMPRESSION: 1. No acute intracranial abnormality related to the reported head trauma. 2. Age-related cerebral and cerebellar volume loss and moderately advanced cerebral white matter disease. 3. Dystrophic calcifications within the right posterior frontal lobe, as before. 4. Extensive vascular calcifications within the carotid siphons. 5. Status post bilateral lens replacement. 6. Mild mucosal disease within the sphenoid sinuses. Electronically signed by: evalene berrigan 01/17/2024 10:10 AM EDT RP Workstation: [id number]   DG Wrist Complete Right Result Date: 01/17/2024 CLINICAL DATA:  Clemens in the bathroom at home with bilateral wrist pain and bilateral ankle pain. EXAM: RIGHT WRIST - COMPLETE 3+ VIEW; LEFT WRIST - COMPLETE 3+ VIEW; RIGHT ANKLE - COMPLETE 3+ VIEW; LEFT ANKLE COMPLETE - 3+ VIEW COMPARISON:  None. FINDINGS: Three views of the right wrist: There is osteopenia without evidence of displaced fractures. There is joint space loss and spurring of the triscaphe and first CMC joints. Joint narrowing and spurring also involving the thumb interphalangeal joint. Other joints are maintained. There are dystrophic subcutaneous calcifications in the dorsoulnar distal forearm. Mild dorsal swelling at the wrist. Three views of left wrist: Osteopenia. There is a transverse nondisplaced fracture of the left distal radial metaphysis, with the slight cortical buckling at the lateral fracture margin. No further evidence of fractures is seen. There is narrowing and osteophytes of the first CMC joint. Other joint spaces are maintained. There are dystrophic calcifications in the distal forearm and  mild to moderate circumferential swelling at the left wrist. Three views right ankle: There is osteopenia without evidence of fractures. Joint spaces are maintained. There is moderate dorsal and plantar calcaneal enthesopathic spurring. There is moderate to severe soft tissue edema greatest laterally, with extension over the hindfoot and midfoot. There are dystrophic subcutaneous calcifications in the lateral distal foreleg. Three views left ankle: There is osteopenia without evidence of fractures. Joint spaces are maintained. There is moderate dorsal and plantar calcaneal enthesopathic spurring. There is severe circumferential edema. IMPRESSION: 1. Osteopenia. 2. Nondisplaced transverse fracture of the left distal radial metaphysis. 3. No further evidence of fractures in the bilateral wrists and ankles. 4. Bilateral wrist and ankle soft tissue swelling. 5. Bilateral calcaneal enthesopathic spurring. 6. Dystrophic subcutaneous calcifications in the distal forearms and lateral distal right foreleg. Electronically Signed   By: Francis Quam M.D.   On: 01/17/2024 06:57   DG Wrist Complete Left Result Date: 01/17/2024 CLINICAL DATA:  Clemens in the bathroom at home with bilateral wrist pain and bilateral ankle pain. EXAM: RIGHT WRIST - COMPLETE 3+ VIEW; LEFT WRIST - COMPLETE 3+ VIEW;  RIGHT ANKLE - COMPLETE 3+ VIEW; LEFT ANKLE COMPLETE - 3+ VIEW COMPARISON:  None. FINDINGS: Three views of the right wrist: There is osteopenia without evidence of displaced fractures. There is joint space loss and spurring of the triscaphe and first CMC joints. Joint narrowing and spurring also involving the thumb interphalangeal joint. Other joints are maintained. There are dystrophic subcutaneous calcifications in the dorsoulnar distal forearm. Mild dorsal swelling at the wrist. Three views of left wrist: Osteopenia. There is a transverse nondisplaced fracture of the left distal radial metaphysis, with the slight cortical buckling at the  lateral fracture margin. No further evidence of fractures is seen. There is narrowing and osteophytes of the first CMC joint. Other joint spaces are maintained. There are dystrophic calcifications in the distal forearm and mild to moderate circumferential swelling at the left wrist. Three views right ankle: There is osteopenia without evidence of fractures. Joint spaces are maintained. There is moderate dorsal and plantar calcaneal enthesopathic spurring. There is moderate to severe soft tissue edema greatest laterally, with extension over the hindfoot and midfoot. There are dystrophic subcutaneous calcifications in the lateral distal foreleg. Three views left ankle: There is osteopenia without evidence of fractures. Joint spaces are maintained. There is moderate dorsal and plantar calcaneal enthesopathic spurring. There is severe circumferential edema. IMPRESSION: 1. Osteopenia. 2. Nondisplaced transverse fracture of the left distal radial metaphysis. 3. No further evidence of fractures in the bilateral wrists and ankles. 4. Bilateral wrist and ankle soft tissue swelling. 5. Bilateral calcaneal enthesopathic spurring. 6. Dystrophic subcutaneous calcifications in the distal forearms and lateral distal right foreleg. Electronically Signed   By: Francis Quam M.D.   On: 01/17/2024 06:57   DG Ankle Complete Right Result Date: 01/17/2024 CLINICAL DATA:  Clemens in the bathroom at home with bilateral wrist pain and bilateral ankle pain. EXAM: RIGHT WRIST - COMPLETE 3+ VIEW; LEFT WRIST - COMPLETE 3+ VIEW; RIGHT ANKLE - COMPLETE 3+ VIEW; LEFT ANKLE COMPLETE - 3+ VIEW COMPARISON:  None. FINDINGS: Three views of the right wrist: There is osteopenia without evidence of displaced fractures. There is joint space loss and spurring of the triscaphe and first CMC joints. Joint narrowing and spurring also involving the thumb interphalangeal joint. Other joints are maintained. There are dystrophic subcutaneous calcifications in the  dorsoulnar distal forearm. Mild dorsal swelling at the wrist. Three views of left wrist: Osteopenia. There is a transverse nondisplaced fracture of the left distal radial metaphysis, with the slight cortical buckling at the lateral fracture margin. No further evidence of fractures is seen. There is narrowing and osteophytes of the first CMC joint. Other joint spaces are maintained. There are dystrophic calcifications in the distal forearm and mild to moderate circumferential swelling at the left wrist. Three views right ankle: There is osteopenia without evidence of fractures. Joint spaces are maintained. There is moderate dorsal and plantar calcaneal enthesopathic spurring. There is moderate to severe soft tissue edema greatest laterally, with extension over the hindfoot and midfoot. There are dystrophic subcutaneous calcifications in the lateral distal foreleg. Three views left ankle: There is osteopenia without evidence of fractures. Joint spaces are maintained. There is moderate dorsal and plantar calcaneal enthesopathic spurring. There is severe circumferential edema. IMPRESSION: 1. Osteopenia. 2. Nondisplaced transverse fracture of the left distal radial metaphysis. 3. No further evidence of fractures in the bilateral wrists and ankles. 4. Bilateral wrist and ankle soft tissue swelling. 5. Bilateral calcaneal enthesopathic spurring. 6. Dystrophic subcutaneous calcifications in the distal forearms and lateral distal right  foreleg. Electronically Signed   By: Francis Quam M.D.   On: 01/17/2024 06:57   DG Ankle Complete Left Result Date: 01/17/2024 CLINICAL DATA:  Clemens in the bathroom at home with bilateral wrist pain and bilateral ankle pain. EXAM: RIGHT WRIST - COMPLETE 3+ VIEW; LEFT WRIST - COMPLETE 3+ VIEW; RIGHT ANKLE - COMPLETE 3+ VIEW; LEFT ANKLE COMPLETE - 3+ VIEW COMPARISON:  None. FINDINGS: Three views of the right wrist: There is osteopenia without evidence of displaced fractures. There is joint  space loss and spurring of the triscaphe and first CMC joints. Joint narrowing and spurring also involving the thumb interphalangeal joint. Other joints are maintained. There are dystrophic subcutaneous calcifications in the dorsoulnar distal forearm. Mild dorsal swelling at the wrist. Three views of left wrist: Osteopenia. There is a transverse nondisplaced fracture of the left distal radial metaphysis, with the slight cortical buckling at the lateral fracture margin. No further evidence of fractures is seen. There is narrowing and osteophytes of the first CMC joint. Other joint spaces are maintained. There are dystrophic calcifications in the distal forearm and mild to moderate circumferential swelling at the left wrist. Three views right ankle: There is osteopenia without evidence of fractures. Joint spaces are maintained. There is moderate dorsal and plantar calcaneal enthesopathic spurring. There is moderate to severe soft tissue edema greatest laterally, with extension over the hindfoot and midfoot. There are dystrophic subcutaneous calcifications in the lateral distal foreleg. Three views left ankle: There is osteopenia without evidence of fractures. Joint spaces are maintained. There is moderate dorsal and plantar calcaneal enthesopathic spurring. There is severe circumferential edema. IMPRESSION: 1. Osteopenia. 2. Nondisplaced transverse fracture of the left distal radial metaphysis. 3. No further evidence of fractures in the bilateral wrists and ankles. 4. Bilateral wrist and ankle soft tissue swelling. 5. Bilateral calcaneal enthesopathic spurring. 6. Dystrophic subcutaneous calcifications in the distal forearms and lateral distal right foreleg. Electronically Signed   By: Francis Quam M.D.   On: 01/17/2024 06:57    Microbiology: Results for orders placed or performed during the hospital encounter of 06/12/23  Resp panel by RT-PCR (RSV, Flu A&B, Covid) Urine, Clean Catch     Status: Abnormal    Collection Time: 06/12/23  8:50 PM   Specimen: Urine, Clean Catch; Nasal Swab  Result Value Ref Range Status   SARS Coronavirus 2 by RT PCR POSITIVE (A) NEGATIVE Final    Comment: (NOTE) SARS-CoV-2 target nucleic acids are DETECTED.  The SARS-CoV-2 RNA is generally detectable in upper respiratory specimens during the acute phase of infection. Positive results are indicative of the presence of the identified virus, but do not rule out bacterial infection or co-infection with other pathogens not detected by the test. Clinical correlation with patient history and other diagnostic information is necessary to determine patient infection status. The expected result is Negative.  Fact Sheet for Patients: BloggerCourse.com  Fact Sheet for Healthcare Providers: SeriousBroker.it  This test is not yet approved or cleared by the [place]  FDA and  has been authorized for detection and/or diagnosis of SARS-CoV-2 by FDA under an Emergency Use Authorization (EUA).  This EUA will remain in effect (meaning this test can be used) for the duration of  the COVID-19 declaration under Section 564(b)(1) of the A ct, 21 U.S.C. section 360bbb-3(b)(1), unless the authorization is terminated or revoked sooner.     Influenza A by PCR NEGATIVE NEGATIVE Final   Influenza B by PCR NEGATIVE NEGATIVE Final    Comment: (NOTE) The  Xpert Xpress SARS-CoV-2/FLU/RSV plus assay is intended as an aid in the diagnosis of influenza from Nasopharyngeal swab specimens and should not be used as a sole basis for treatment. Nasal washings and aspirates are unacceptable for Xpert Xpress SARS-CoV-2/FLU/RSV testing.  Fact Sheet for Patients: BloggerCourse.com  Fact Sheet for Healthcare Providers: SeriousBroker.it  This test is not yet approved or cleared by the [place]  FDA and has been authorized for detection  and/or diagnosis of SARS-CoV-2 by FDA under an Emergency Use Authorization (EUA). This EUA will remain in effect (meaning this test can be used) for the duration of the COVID-19 declaration under Section 564(b)(1) of the Act, 21 U.S.C. section 360bbb-3(b)(1), unless the authorization is terminated or revoked.     Resp Syncytial Virus by PCR NEGATIVE NEGATIVE Final    Comment: (NOTE) Fact Sheet for Patients: BloggerCourse.com  Fact Sheet for Healthcare Providers: SeriousBroker.it  This test is not yet approved or cleared by the [place]  FDA and has been authorized for detection and/or diagnosis of SARS-CoV-2 by FDA under an Emergency Use Authorization (EUA). This EUA will remain in effect (meaning this test can be used) for the duration of the COVID-19 declaration under Section 564(b)(1) of the Act, 21 U.S.C. section 360bbb-3(b)(1), unless the authorization is terminated or revoked.  Performed at [facility], [address], [place], [place] [address]    Labs: CBC: Recent Labs  Lab 01/17/24 0754  WBC 7.4  NEUTROABS 4.8  HGB 11.6*  HCT 37.4  MCV 96.6  PLT 157   Basic Metabolic Panel: Recent Labs  Lab 01/17/24 0754  NA 137  K 4.3  CL 105  CO2 25  GLUCOSE 93  BUN 17  CREATININE 0.83  CALCIUM  8.9   Liver Function Tests: Recent Labs  Lab 01/17/24 0754  AST 26  ALT 10  ALKPHOS 57  BILITOT 0.6  PROT 6.0*  ALBUMIN 3.0*   CBG: No results for input(s): GLUCAP in the last 168 hours.  Discharge time spent: greater than 30 minutes.  Author: Yetta Blanch, MD  [facility]

## 2024-01-23 DIAGNOSIS — F4323 Adjustment disorder with mixed anxiety and depressed mood: Secondary | ICD-10-CM | POA: Diagnosis not present

## 2024-01-24 DIAGNOSIS — I119 Hypertensive heart disease without heart failure: Secondary | ICD-10-CM | POA: Diagnosis not present

## 2024-01-24 DIAGNOSIS — I350 Nonrheumatic aortic (valve) stenosis: Secondary | ICD-10-CM | POA: Diagnosis not present

## 2024-01-24 DIAGNOSIS — F411 Generalized anxiety disorder: Secondary | ICD-10-CM | POA: Diagnosis not present

## 2024-01-24 DIAGNOSIS — E7800 Pure hypercholesterolemia, unspecified: Secondary | ICD-10-CM | POA: Diagnosis not present

## 2024-01-24 DIAGNOSIS — G8929 Other chronic pain: Secondary | ICD-10-CM | POA: Diagnosis not present

## 2024-01-24 DIAGNOSIS — G309 Alzheimer's disease, unspecified: Secondary | ICD-10-CM | POA: Diagnosis not present

## 2024-01-24 DIAGNOSIS — Z9181 History of falling: Secondary | ICD-10-CM | POA: Diagnosis not present

## 2024-01-24 DIAGNOSIS — N3281 Overactive bladder: Secondary | ICD-10-CM | POA: Diagnosis not present

## 2024-01-24 DIAGNOSIS — G629 Polyneuropathy, unspecified: Secondary | ICD-10-CM | POA: Diagnosis not present

## 2024-01-24 DIAGNOSIS — S32010D Wedge compression fracture of first lumbar vertebra, subsequent encounter for fracture with routine healing: Secondary | ICD-10-CM | POA: Diagnosis not present

## 2024-01-24 DIAGNOSIS — E039 Hypothyroidism, unspecified: Secondary | ICD-10-CM | POA: Diagnosis not present

## 2024-01-24 DIAGNOSIS — F0390 Unspecified dementia without behavioral disturbance: Secondary | ICD-10-CM | POA: Diagnosis not present

## 2024-01-27 DIAGNOSIS — F02C0 Dementia in other diseases classified elsewhere, severe, without behavioral disturbance, psychotic disturbance, mood disturbance, and anxiety: Secondary | ICD-10-CM | POA: Diagnosis not present

## 2024-01-27 DIAGNOSIS — G301 Alzheimer's disease with late onset: Secondary | ICD-10-CM | POA: Diagnosis not present

## 2024-01-27 DIAGNOSIS — G4700 Insomnia, unspecified: Secondary | ICD-10-CM | POA: Diagnosis not present

## 2024-01-27 DIAGNOSIS — F411 Generalized anxiety disorder: Secondary | ICD-10-CM | POA: Diagnosis not present

## 2024-01-30 DIAGNOSIS — R2681 Unsteadiness on feet: Secondary | ICD-10-CM | POA: Diagnosis not present

## 2024-01-30 DIAGNOSIS — R278 Other lack of coordination: Secondary | ICD-10-CM | POA: Diagnosis not present

## 2024-02-03 DIAGNOSIS — R278 Other lack of coordination: Secondary | ICD-10-CM | POA: Diagnosis not present

## 2024-02-03 DIAGNOSIS — R2681 Unsteadiness on feet: Secondary | ICD-10-CM | POA: Diagnosis not present

## 2024-02-06 DIAGNOSIS — F4323 Adjustment disorder with mixed anxiety and depressed mood: Secondary | ICD-10-CM | POA: Diagnosis not present

## 2024-02-11 ENCOUNTER — Emergency Department (HOSPITAL_COMMUNITY)

## 2024-02-11 ENCOUNTER — Emergency Department (HOSPITAL_COMMUNITY)
Admission: EM | Admit: 2024-02-11 | Discharge: 2024-02-11 | Disposition: A | Discharge status: Home / Self Care | Attending: Emergency Medicine | Admitting: Emergency Medicine

## 2024-02-11 DIAGNOSIS — G308 Other Alzheimer's disease: Secondary | ICD-10-CM | POA: Insufficient documentation

## 2024-02-11 DIAGNOSIS — I700 Atherosclerosis of aorta: Secondary | ICD-10-CM | POA: Diagnosis not present

## 2024-02-11 DIAGNOSIS — S0003XA Contusion of scalp, initial encounter: Secondary | ICD-10-CM | POA: Diagnosis not present

## 2024-02-11 DIAGNOSIS — S069X9A Unspecified intracranial injury with loss of consciousness of unspecified duration, initial encounter: Secondary | ICD-10-CM | POA: Diagnosis not present

## 2024-02-11 DIAGNOSIS — R531 Weakness: Secondary | ICD-10-CM | POA: Diagnosis not present

## 2024-02-11 DIAGNOSIS — G9389 Other specified disorders of brain: Secondary | ICD-10-CM | POA: Diagnosis not present

## 2024-02-11 DIAGNOSIS — W19XXXA Unspecified fall, initial encounter: Secondary | ICD-10-CM | POA: Diagnosis not present

## 2024-02-11 DIAGNOSIS — R102 Pelvic and perineal pain: Secondary | ICD-10-CM | POA: Diagnosis not present

## 2024-02-11 DIAGNOSIS — M19011 Primary osteoarthritis, right shoulder: Secondary | ICD-10-CM | POA: Diagnosis not present

## 2024-02-11 DIAGNOSIS — R140 Abdominal distension (gaseous): Secondary | ICD-10-CM | POA: Diagnosis not present

## 2024-02-11 DIAGNOSIS — G319 Degenerative disease of nervous system, unspecified: Secondary | ICD-10-CM | POA: Diagnosis not present

## 2024-02-11 DIAGNOSIS — M25511 Pain in right shoulder: Secondary | ICD-10-CM | POA: Diagnosis not present

## 2024-02-11 DIAGNOSIS — W1809XA Striking against other object with subsequent fall, initial encounter: Secondary | ICD-10-CM | POA: Diagnosis not present

## 2024-02-11 DIAGNOSIS — S199XXA Unspecified injury of neck, initial encounter: Secondary | ICD-10-CM | POA: Diagnosis not present

## 2024-02-11 DIAGNOSIS — M545 Low back pain, unspecified: Secondary | ICD-10-CM | POA: Diagnosis not present

## 2024-02-11 DIAGNOSIS — S0083XA Contusion of other part of head, initial encounter: Secondary | ICD-10-CM | POA: Diagnosis present

## 2024-02-11 DIAGNOSIS — R079 Chest pain, unspecified: Secondary | ICD-10-CM | POA: Diagnosis not present

## 2024-02-11 DIAGNOSIS — M4856XA Collapsed vertebra, not elsewhere classified, lumbar region, initial encounter for fracture: Secondary | ICD-10-CM | POA: Diagnosis not present

## 2024-02-11 DIAGNOSIS — F0280 Dementia in other diseases classified elsewhere without behavioral disturbance: Secondary | ICD-10-CM | POA: Insufficient documentation

## 2024-02-11 DIAGNOSIS — I878 Other specified disorders of veins: Secondary | ICD-10-CM | POA: Diagnosis not present

## 2024-02-11 LAB — CBC WITH DIFFERENTIAL/PLATELET
Abs Immature Granulocytes: 0.06 K/uL (ref 0.00–0.07)
Basophils Absolute: 0.1 K/uL (ref 0.0–0.1)
Basophils Relative: 1 %
Eosinophils Absolute: 0.2 K/uL (ref 0.0–0.5)
Eosinophils Relative: 3 %
HCT: 39.9 % (ref 36.0–46.0)
Hemoglobin: 12.3 g/dL (ref 12.0–15.0)
Immature Granulocytes: 1 %
Lymphocytes Relative: 29 %
Lymphs Abs: 1.7 K/uL (ref 0.7–4.0)
MCH: 29.6 pg (ref 26.0–34.0)
MCHC: 30.8 g/dL (ref 30.0–36.0)
MCV: 96.1 fL (ref 80.0–100.0)
Monocytes Absolute: 0.6 K/uL (ref 0.1–1.0)
Monocytes Relative: 11 %
Neutro Abs: 3.2 K/uL (ref 1.7–7.7)
Neutrophils Relative %: 55 %
Platelets: 169 K/uL (ref 150–400)
RBC: 4.15 MIL/uL (ref 3.87–5.11)
RDW: 15.8 % — ABNORMAL HIGH (ref 11.5–15.5)
WBC: 5.9 K/uL (ref 4.0–10.5)
nRBC: 0 % (ref 0.0–0.2)

## 2024-02-11 LAB — CK: Total CK: 47 U/L (ref 38–234)

## 2024-02-11 LAB — COMPREHENSIVE METABOLIC PANEL WITH GFR
ALT: 11 U/L (ref 0–44)
AST: 19 U/L (ref 15–41)
Albumin: 3.3 g/dL — ABNORMAL LOW (ref 3.5–5.0)
Alkaline Phosphatase: 79 U/L (ref 38–126)
Anion gap: 10 (ref 5–15)
BUN: 17 mg/dL (ref 8–23)
CO2: 25 mmol/L (ref 22–32)
Calcium: 9.1 mg/dL (ref 8.9–10.3)
Chloride: 101 mmol/L (ref 98–111)
Creatinine, Ser: 0.79 mg/dL (ref 0.44–1.00)
GFR, Estimated: >60 mL/min (ref >60)
Glucose, Bld: 93 mg/dL (ref 70–99)
Potassium: 4.3 mmol/L (ref 3.5–5.1)
Sodium: 136 mmol/L (ref 135–145)
Total Bilirubin: 0.5 mg/dL (ref 0.0–1.2)
Total Protein: 6.5 g/dL (ref 6.5–8.1)

## 2024-02-11 MED ORDER — HYDROCODONE-ACETAMINOPHEN 5-325 MG PO TABS: 1.0000 | ORAL_TABLET | Freq: Once | ORAL | Status: DC

## 2024-02-11 NOTE — Progress Notes (Signed)
 [facility] [address] [facility] Liaison Note   This patient is?a current hospice patient with [facility], admitted with a terminal diagnosis of Dementia. ?   Patient is a DNR  and [facility] (dtr) [phone] is the patient's primary CG.   We will continue to follow for any discharge planning needs and to coordinate continuation of?hospice care.?   Please don't hesitate to call with any Hospice related questions or concerns.    Thank you for the opportunity to participate in this patient's care.   Nat Babe, BSN, [facility] Liaison [phone]

## 2024-02-11 NOTE — ED Triage Notes (Signed)
 Chief Complaint  Patient presents with   Fall   Pt from [facility], per staff pt was found on floor by staff, picked up and called EMS, pt has contusion to left scalp and left forehead, no bleeding, unknown LOC, unknown how long down. Pt c/o pain on forehead. Pt is AxOx Self, and location, Pt is a HOSPICE patient, pt was brought to Hospital before contacting hospice or family. Per ems they were not told   Deanna Olson, Deanna Olson

## 2024-02-11 NOTE — ED Notes (Signed)
 Back from radiology. Alert, NAD, calm, interactive, resting comfortably. Speech clear and coherent. Confusion c/w h/o dementia. VSS.

## 2024-02-11 NOTE — ED Provider Notes (Signed)
 [place] EMERGENCY DEPARTMENT AT [facility] Provider Note   CSN: [id number] Arrival date & time: 02/11/24  9357     Patient presents with: Deanna Olson is a 89 y.o. female.   89 year old female with a history of Alzheimer's dementia and severe aortic stenosis who presents emergency department for fall.  Patient was found on the ground by [facility] staff this morning.  Unclear how long she was down for.  Does have some bruising to the left side of her head.  Apparently has recurrent falls and is hospitalized earlier in the month for this.  It is thought that it might be due to polypharmacy versus severe aortic stenosis but she is not a interventional or surgical candidate and so was placed on hospice.       Prior to Admission medications   Medication Sig Start Date End Date Taking? Authorizing Provider  acetaminophen  (TYLENOL ) 325 MG tablet Take 650 mg by mouth in the morning and at bedtime.    [provider]  albuterol  (VENTOLIN  HFA) 108 (90 Base) MCG/ACT inhaler Inhale 2 puffs into the lungs every 6 (six) hours as needed for wheezing or shortness of breath.    [provider]  ALPRAZolam  (XANAX ) 0.5 MG tablet Take 1 tablet (0.5 mg total) by mouth 2 (two) times daily. 01/20/24   Tobie Yetta HERO, MD  betamethasone  dipropionate 0.05 % lotion APPLY TOPICALLY DAILY AS NEEDED USES ON SCALP AND EARS Patient taking differently: Apply 1 application  topically daily as needed (rash). 12/22/22   Rollene Almarie LABOR, MD  Cholecalciferol (VITAMIN D3) 10 MCG (400 UNIT) tablet Take 400 Units by mouth daily.    [provider]  diclofenac  Sodium (VOLTAREN ) 1 % GEL Apply 2-4 g topically See admin instructions. Apply 2 grams to shoulders and back 3 times daily and apply 4 grams to legs 3 times daily.    [provider]  diphenhydramine-acetaminophen  (PAIN RELIEF PM EXTRA STRENGTH) 25-500 MG TABS tablet Take 1 tablet by mouth at bedtime as needed  (sleep).    [provider]  gabapentin  (NEURONTIN ) 100 MG capsule TAKE 1 CAPSULE BY MOUTH EVERY MORNING 06/27/23   Rollene Almarie LABOR, MD  gabapentin  (NEURONTIN ) 300 MG capsule TAKE 1 CAPSULE BY MOUTH EVERY NIGHT AT BEDTIME 06/06/23   Rollene Almarie LABOR, MD  guaiFENesin -dextromethorphan (ROBITUSSIN DM) 100-10 MG/5ML syrup Take 5 mLs by mouth every 4 (four) hours as needed for cough. 10/30/21   Norleen Lynwood ORN, MD  ketoconazole  (NIZORAL ) 2 % cream APPLY ONE APPLICATION TOPICALLY DAILY Patient taking differently: Apply 1 Application topically See admin instructions. Apply topically to rash 3 times daily, with every shift. 08/26/23   Rollene Almarie LABOR, MD  levothyroxine  (SYNTHROID ) 50 MCG tablet TAKE 1 TABLET BY MOUTH DAILY BEFORE BREAKFAST 04/11/23   Rollene Almarie LABOR, MD  lidocaine  4 % Place 1 patch onto the skin daily. Apply at 0800, remove at 2000.    [provider]  losartan  (COZAAR ) 50 MG tablet TAKE 1 TABLET BY MOUTH DAILY 08/08/23   Rollene Almarie LABOR, MD  melatonin 3 MG TABS tablet Take 6 mg by mouth at bedtime.    [provider]  mirabegron  ER (MYRBETRIQ ) 50 MG TB24 tablet TAKE 1 TABLET BY MOUTH DAILY **NEED APPOINTMENT FOR REFILLS** 08/02/23   Rollene Almarie LABOR, MD  Multiple Vitamins-Minerals (PRESERVISION AREDS 2) CAPS Take 1 capsule by mouth in the morning and at bedtime.    [provider]  omeprazole  (  PRILOSEC) 20 MG capsule TAKE 1 CAPSULE BY MOUTH DAILY 08/22/23   Rollene Almarie LABOR, MD  oxyCODONE  (OXY IR/ROXICODONE ) 5 MG immediate release tablet Take 1 tablet (5 mg total) by mouth every 4 (four) hours as needed for moderate pain (pain score 4-6) or severe pain (pain score 7-10). 01/20/24   Tobie Yetta HERO, MD  Polyethyl Glycol-Propyl Glycol (SYSTANE ULTRA OP) Place 1 drop into both eyes in the morning and at bedtime.    [provider]  traZODone  (DESYREL ) 50 MG tablet Take 50 mg by mouth at bedtime.    [provider]  Xylitol (XYLIMELTS MT) Take 1 tablet by mouth at bedtime.    [provider]    Allergies: Sulfonamide derivatives    Review of Systems  Updated Vital Signs BP 126/80   Pulse 76   Temp 97.8 F (36.6 C) (Oral)   Resp 16   Ht 5' 2 (1.575 m)   Wt 70 kg   SpO2 100%   BMI 28.23 kg/m   Physical Exam Constitutional:      General: She is not in acute distress.    Appearance: Normal appearance. She is not ill-appearing.  HENT:     Head: Normocephalic.     Comments: Bruise to left forehead no bleeding    Right Ear: External ear normal.     Left Ear: External ear normal.     Mouth/Throat:     Mouth: Mucous membranes are moist.     Pharynx: Oropharynx is clear.  Eyes:     Extraocular Movements: Extraocular movements intact.     Conjunctiva/sclera: Conjunctivae normal.     Pupils: Pupils are equal, round, and reactive to light.  Neck:     Comments: No C-spine midline tenderness to palpation Cardiovascular:     Rate and Rhythm: Normal rate and regular rhythm.     Pulses: Normal pulses.     Heart sounds: Normal heart sounds.  Pulmonary:     Effort: Pulmonary effort is normal. No respiratory distress.     Breath sounds: Normal breath sounds.  Abdominal:     General: Abdomen is flat. There is no distension.     Palpations: Abdomen is soft. There is no mass.     Tenderness: There is no abdominal tenderness. There is no guarding.  Musculoskeletal:        General: No deformity. Normal range of motion.     Cervical back: No rigidity or tenderness.     Comments: No tenderness to palpation of midline thoracic or lumbar spine.  No step-offs palpated.  Tenderness palpation of right clavicle  No tenderness to palpation, bruising, or deformities noted of bilateral shoulders, elbows, wrists, hips, knees, or ankles.  Neurological:     General: No focal deficit present.     Mental Status: She is alert and oriented to person, place, and time. Mental status is at baseline.      Cranial Nerves: No cranial nerve deficit.     Sensory: No sensory deficit.     Motor: No weakness.     (all labs ordered are listed, but only abnormal results are displayed) Labs Reviewed  CBC WITH DIFFERENTIAL/PLATELET - Abnormal; Notable for the following components:      Result Value   RDW 15.8 (*)    All other components within normal limits  COMPREHENSIVE METABOLIC PANEL WITH GFR - Abnormal; Notable for the following components:   Albumin 3.3 (*)    All other components within normal limits  CK    EKG: None  Radiology: CT Head Wo Contrast Result Date: 02/11/2024 EXAM: CT HEAD AND CERVICAL SPINE 02/11/2024 10:08:29 AM TECHNIQUE: CT of the head and cervical spine was performed without the administration of intravenous contrast. Multiplanar reformatted images are provided for review. Automated exposure control, iterative reconstruction, and/or weight based adjustment of the mA/kV was utilized to reduce the radiation dose to as low as reasonably achievable. COMPARISON: Comparison with CT head and cervical spine on 01/17/2024. CLINICAL HISTORY: Head trauma, GCS=15, loss of consciousness (LOC) (Ped 0-17y). Chief complaints; Fall. FINDINGS: CT HEAD BRAIN AND VENTRICLES: Chronic microvascular ischemia and generalized atrophy. Dystrophic calcifications within the right posterior frontal lobe are unchanged from prior. No acute intracranial hemorrhage. No mass effect or midline shift. No abnormal extra-axial fluid collection. No evidence of acute infarct. No hydrocephalus. ORBITS: No acute abnormality. SINUSES AND MASTOIDS: Frothy mucus in the bilateral sphenoid sinuses. SOFT TISSUES AND SKULL: No acute skull fracture. No acute soft tissue abnormality. CT CERVICAL SPINE BONES AND ALIGNMENT: No acute fracture or traumatic malalignment. DEGENERATIVE CHANGES: Multilevel chronic degenerative changes are unchanged from 01/17/2024 and are greatest from C5-C7. SOFT TISSUES: No prevertebral soft tissue  swelling. IMPRESSION: 1. No acute intracranial abnormality. 2. No acute fracture or traumatic malalignment of the cervical spine. Electronically signed by: Norman Gatlin MD 02/11/2024 10:18 AM EDT RP Workstation: [id number]   CT Cervical Spine Wo Contrast Result Date: 02/11/2024 EXAM: CT HEAD AND CERVICAL SPINE 02/11/2024 10:08:29 AM TECHNIQUE: CT of the head and cervical spine was performed without the administration of intravenous contrast. Multiplanar reformatted images are provided for review. Automated exposure control, iterative reconstruction, and/or weight based adjustment of the mA/kV was utilized to reduce the radiation dose to as low as reasonably achievable. COMPARISON: Comparison with CT head and cervical spine on 01/17/2024. CLINICAL HISTORY: Head trauma, GCS=15, loss of consciousness (LOC) (Ped 0-17y). Chief complaints; Fall. FINDINGS: CT HEAD BRAIN AND VENTRICLES: Chronic microvascular ischemia and generalized atrophy. Dystrophic calcifications within the right posterior frontal lobe are unchanged from prior. No acute intracranial hemorrhage. No mass effect or midline shift. No abnormal extra-axial fluid collection. No evidence of acute infarct. No hydrocephalus. ORBITS: No acute abnormality. SINUSES AND MASTOIDS: Frothy mucus in the bilateral sphenoid sinuses. SOFT TISSUES AND SKULL: No acute skull fracture. No acute soft tissue abnormality. CT CERVICAL SPINE BONES AND ALIGNMENT: No acute fracture or traumatic malalignment. DEGENERATIVE CHANGES: Multilevel chronic degenerative changes are unchanged from 01/17/2024 and are greatest from C5-C7. SOFT TISSUES: No prevertebral soft tissue swelling. IMPRESSION: 1. No acute intracranial abnormality. 2. No acute fracture or traumatic malalignment of the cervical spine. Electronically signed by: Norman Gatlin MD 02/11/2024 10:18 AM EDT RP Workstation: [id number]   DG Lumbar Spine Complete Result Date: 02/11/2024 CLINICAL DATA:  89 year old female  status post unwitnessed fall, found down. Scalp contusion. Pain. EXAM: LUMBAR SPINE - COMPLETE 4+ VIEW COMPARISON:  CT Abdomen and Pelvis 01/17/2024. Lumbar radiographs 04/22/2023. FINDINGS: Five views 0801 hours. Normal lumbar segmentation by CT. Chronic previously augmented L3 and L4 compression fractures. Underlying osteopenia. L1 compression fracture appears stable since 01/17/2024. Visible lower thoracic levels, L2, and L5 appears stable and intact. Grossly intact sacral ala, visible pelvis. Calcified aortic atherosclerosis. Nonobstructed bowel-gas pattern. IMPRESSION: Unchanged L1, L3, and L4 compression fractures. No acute osseous abnormality identified in the Lumbar spine. Aortic Atherosclerosis (ICD10-I70.0). Electronically Signed   By: VEAR Hurst M.D.   On: 02/11/2024 09:06   DG Clavicle Right Result Date: 02/11/2024 CLINICAL DATA:  89 year old female  status post unwitnessed fall, found down. Scalp contusion. Pain. EXAM: RIGHT CLAVICLE - 2+ VIEWS COMPARISON:  Right shoulder series 04/17/2015. FINDINGS: Two views at 0757 hours. Mild chronic deformity of the distal right clavicle appears unchanged since 2016, healed. Maintained right AC joint alignment with chronic joint space loss and degenerative spurring. No acute fracture of the right clavicle identified. Grossly intact visible right scapula and proximal humerus. Visible right upper lung appears clear. IMPRESSION: No acute fracture or dislocation identified about the right clavicle. Electronically Signed   By: VEAR Hurst M.D.   On: 02/11/2024 09:04   DG Pelvis 1-2 Views Result Date: 02/11/2024 CLINICAL DATA:  89 year old female status post unwitnessed fall, found down. Scalp contusion. Pain. EXAM: PELVIS - 1-2 VIEW COMPARISON:  CT Abdomen and Pelvis 01/17/2024. Pelvis radiograph 12/30/2014. FINDINGS: Portable AP supine view at 0800 hours. Pelvis appears stable and intact. Symmetric SI joints. Partially visible previously augmented lumbar compression  fractures. Femoral heads normally located. Grossly intact proximal femurs. Pelvic phleboliths. Nonobstructed bowel-gas pattern. IMPRESSION: No acute fracture or dislocation identified about the pelvis. Electronically Signed   By: VEAR Hurst M.D.   On: 02/11/2024 09:03   DG Chest 1 View Result Date: 02/11/2024 CLINICAL DATA:  89 year old female status post unwitnessed fall, found down. Scalp contusion. Pain. EXAM: CHEST  1 VIEW COMPARISON:  CT Chest, Abdomen, and Pelvis 01/17/2024 and earlier. FINDINGS: Portable AP semi upright view at 0752 hours. Lordotic positioning. Lung volumes appear stable. Stable cardiac size and mediastinal contours. Calcified aortic atherosclerosis. Visualized tracheal air column is within normal limits. Allowing for portable technique the lungs are clear. Paucity of bowel gas in the visible abdomen. Osteopenia. Stable visualized osseous structures. IMPRESSION: No acute cardiopulmonary abnormality or acute traumatic injury identified. Electronically Signed   By: VEAR Hurst M.D.   On: 02/11/2024 09:02     Procedures   Medications Ordered in the ED  HYDROcodone -acetaminophen  (NORCO/VICODIN) 5-325 MG per tablet 1 tablet (1 tablet Oral Not Given 02/11/24 0821)                                    Medical Decision Making Amount and/or Complexity of Data Reviewed Labs: ordered. Radiology: ordered.  Risk Prescription drug management.   89 year old female with dementia and severe aortic stenosis and recurrent falls who presents emergency department with a fall  Initial Ddx:  Fall, rhabdomyolysis, TBI, C-spine injury, syncope, polypharmacy  MDM/Course:  Patient presents emergency department for fall.  Does have a history of recurrent falls.  Was hospitalized earlier in the month for this and thought it was due to polypharmacy versus severe aortic stenosis causing her to pass out.  Not a surgical candidate at this point in time for her aortic stenosis.  Does have a bruise on her  forehead.  No other concerning signs of trauma currently.  Unclear how long she was down for.  She underwent a CT of the head and C-spine did not show acute abnormality.  Also had chest x-ray and pelvis that do not show any acute abnormality.  Was later complaining of some clavicular pain and x-ray was obtained that did not show acute abnormality.  Blood work including CBC, CMP, and CK that did not show any concerning findings.  Does have lumbar spine fractures and so a lumbar spine x-ray was also obtained which does not show any acute fractures or worsening of her old fractures.  Discharged back  to her facility.  This patient presents to the ED for concern of complaints listed in HPI, this involves an extensive number of treatment options, and is a complaint that carries with it a high risk of complications and morbidity. Disposition including potential need for admission considered.   Dispo: DC to Facility  Records reviewed Admission Notes The following labs were independently interpreted: Chemistry and show no acute abnormality I independently reviewed the following imaging with scope of interpretation limited to determining acute life threatening conditions related to emergency care: CT Head and agree with the radiologist interpretation with the following exceptions: none I personally reviewed and interpreted cardiac monitoring: normal sinus rhythm  I personally reviewed and interpreted the pt's EKG: see above for interpretation  I have reviewed the patients home medications and made adjustments as needed Social Determinants of health:  Geriatric  Portions of this note were generated with Scientist, clinical (histocompatibility and immunogenetics). Dictation errors may occur despite best attempts at proofreading.     Final diagnoses:  Fall, initial encounter  Contusion of scalp, initial encounter    ED Discharge Orders     None          Yolande Lamar BROCKS, MD 02/11/24 1840

## 2024-02-11 NOTE — Discharge Instructions (Addendum)
 You are seen after falling.  You had a CAT scan of your head as well as x-rays of your back and chest.  There were no injuries noted.  Please wear the back brace you were prescribed.  Follow-up with your primary doctor in 2 to 3 days and the spine doctors as instructed previously.

## 2024-02-11 NOTE — ED Notes (Signed)
 Daughters x2 arrive to [facility]

## 2024-02-11 NOTE — ED Notes (Signed)
 PTAR scheduled for the patient to return to [facility].  No ETA available from dispatch. RN made aware of transport.

## 2024-03-19 DIAGNOSIS — N183 Chronic kidney disease, stage 3 unspecified: Secondary | ICD-10-CM | POA: Diagnosis not present
